# Patient Record
Sex: Female | Born: 1970
Health system: Southern US, Community
[De-identification: ages and names within clinical notes are randomized; demographics above are authoritative.]

## PROBLEM LIST (undated history)

## (undated) DIAGNOSIS — R112 Nausea with vomiting, unspecified: Secondary | ICD-10-CM

## (undated) DIAGNOSIS — I10 Essential (primary) hypertension: Secondary | ICD-10-CM

## (undated) DIAGNOSIS — E278 Other specified disorders of adrenal gland: Secondary | ICD-10-CM

## (undated) DIAGNOSIS — M1990 Unspecified osteoarthritis, unspecified site: Secondary | ICD-10-CM

## (undated) DIAGNOSIS — K8020 Calculus of gallbladder without cholecystitis without obstruction: Secondary | ICD-10-CM

## (undated) DIAGNOSIS — Z9889 Other specified postprocedural states: Secondary | ICD-10-CM

## (undated) DIAGNOSIS — K219 Gastro-esophageal reflux disease without esophagitis: Secondary | ICD-10-CM

## (undated) DIAGNOSIS — G4730 Sleep apnea, unspecified: Secondary | ICD-10-CM

## (undated) DIAGNOSIS — M069 Rheumatoid arthritis, unspecified: Secondary | ICD-10-CM

## (undated) DIAGNOSIS — E669 Obesity, unspecified: Secondary | ICD-10-CM

## (undated) DIAGNOSIS — D472 Monoclonal gammopathy: Secondary | ICD-10-CM

## (undated) HISTORY — PX: BREAST EXCISIONAL BIOPSY: SUR124

## (undated) HISTORY — DX: Unspecified osteoarthritis, unspecified site: M19.90

## (undated) HISTORY — DX: Sleep apnea, unspecified: G47.30

## (undated) HISTORY — DX: Rheumatoid arthritis, unspecified: M06.9

## (undated) HISTORY — DX: Other specified disorders of adrenal gland: E27.8

## (undated) HISTORY — DX: Monoclonal gammopathy: D47.2

## (undated) HISTORY — DX: Calculus of gallbladder without cholecystitis without obstruction: K80.20

---

## 2010-02-06 ENCOUNTER — Emergency Department (HOSPITAL_COMMUNITY): Admission: EM | Admit: 2010-02-06 | Discharge: 2010-02-06 | Payer: Self-pay | Admitting: Family Medicine

## 2010-08-01 ENCOUNTER — Emergency Department (HOSPITAL_COMMUNITY): Admission: EM | Admit: 2010-08-01 | Discharge: 2010-08-01 | Payer: Self-pay | Admitting: Emergency Medicine

## 2011-02-06 ENCOUNTER — Other Ambulatory Visit: Payer: Self-pay | Admitting: Internal Medicine

## 2011-02-16 ENCOUNTER — Other Ambulatory Visit: Payer: Self-pay | Admitting: Internal Medicine

## 2011-02-16 DIAGNOSIS — Z1231 Encounter for screening mammogram for malignant neoplasm of breast: Secondary | ICD-10-CM

## 2011-02-23 LAB — POCT I-STAT, CHEM 8
Creatinine, Ser: 0.9 mg/dL (ref 0.4–1.2)
HCT: 39 % (ref 36.0–46.0)
Hemoglobin: 13.3 g/dL (ref 12.0–15.0)
Potassium: 3.8 mEq/L (ref 3.5–5.1)
Sodium: 140 mEq/L (ref 135–145)

## 2011-02-25 ENCOUNTER — Ambulatory Visit
Admission: RE | Admit: 2011-02-25 | Discharge: 2011-02-25 | Disposition: A | Discharge status: Home / Self Care | Payer: BC Managed Care – PPO | Source: Ambulatory Visit | Attending: Internal Medicine | Admitting: Internal Medicine

## 2011-02-25 DIAGNOSIS — Z1231 Encounter for screening mammogram for malignant neoplasm of breast: Secondary | ICD-10-CM

## 2011-02-26 ENCOUNTER — Other Ambulatory Visit: Payer: Self-pay | Admitting: Internal Medicine

## 2011-02-26 DIAGNOSIS — N6452 Nipple discharge: Secondary | ICD-10-CM

## 2011-03-02 ENCOUNTER — Ambulatory Visit
Admission: RE | Admit: 2011-03-02 | Discharge: 2011-03-02 | Disposition: A | Discharge status: Home / Self Care | Payer: BC Managed Care – PPO | Source: Ambulatory Visit | Attending: Internal Medicine | Admitting: Internal Medicine

## 2011-03-02 DIAGNOSIS — N6452 Nipple discharge: Secondary | ICD-10-CM

## 2011-09-11 ENCOUNTER — Emergency Department (HOSPITAL_COMMUNITY)
Admission: EM | Admit: 2011-09-11 | Discharge: 2011-09-11 | Disposition: A | Discharge status: Home / Self Care | Payer: BC Managed Care – PPO | Attending: Emergency Medicine | Admitting: Emergency Medicine

## 2011-09-11 DIAGNOSIS — E669 Obesity, unspecified: Secondary | ICD-10-CM | POA: Insufficient documentation

## 2011-09-11 DIAGNOSIS — I10 Essential (primary) hypertension: Secondary | ICD-10-CM | POA: Insufficient documentation

## 2011-09-11 DIAGNOSIS — B9689 Other specified bacterial agents as the cause of diseases classified elsewhere: Secondary | ICD-10-CM | POA: Insufficient documentation

## 2011-09-11 DIAGNOSIS — A499 Bacterial infection, unspecified: Secondary | ICD-10-CM | POA: Insufficient documentation

## 2011-09-11 DIAGNOSIS — N760 Acute vaginitis: Secondary | ICD-10-CM | POA: Insufficient documentation

## 2011-09-11 DIAGNOSIS — N949 Unspecified condition associated with female genital organs and menstrual cycle: Secondary | ICD-10-CM | POA: Insufficient documentation

## 2011-09-11 LAB — WET PREP, GENITAL
Trich, Wet Prep: NONE SEEN
Yeast Wet Prep HPF POC: NONE SEEN

## 2011-09-11 LAB — URINALYSIS, ROUTINE W REFLEX MICROSCOPIC
Bilirubin Urine: NEGATIVE
Ketones, ur: NEGATIVE mg/dL
Nitrite: NEGATIVE
Protein, ur: NEGATIVE mg/dL
Urobilinogen, UA: 0.2 mg/dL (ref 0.0–1.0)
pH: 5 (ref 5.0–8.0)

## 2011-09-11 LAB — POCT PREGNANCY, URINE: Preg Test, Ur: NEGATIVE

## 2012-03-22 ENCOUNTER — Encounter (HOSPITAL_COMMUNITY): Payer: Self-pay | Admitting: *Deleted

## 2012-03-22 ENCOUNTER — Emergency Department (HOSPITAL_COMMUNITY)
Admission: EM | Admit: 2012-03-22 | Discharge: 2012-03-22 | Disposition: A | Discharge status: Home / Self Care | Payer: BC Managed Care – PPO | Attending: Emergency Medicine | Admitting: Emergency Medicine

## 2012-03-22 DIAGNOSIS — N63 Unspecified lump in unspecified breast: Secondary | ICD-10-CM | POA: Insufficient documentation

## 2012-03-22 DIAGNOSIS — I10 Essential (primary) hypertension: Secondary | ICD-10-CM | POA: Insufficient documentation

## 2012-03-22 DIAGNOSIS — N611 Abscess of the breast and nipple: Secondary | ICD-10-CM

## 2012-03-22 DIAGNOSIS — R509 Fever, unspecified: Secondary | ICD-10-CM | POA: Insufficient documentation

## 2012-03-22 DIAGNOSIS — N61 Mastitis without abscess: Secondary | ICD-10-CM | POA: Insufficient documentation

## 2012-03-22 HISTORY — DX: Essential (primary) hypertension: I10

## 2012-03-22 MED ORDER — HYDROCODONE-ACETAMINOPHEN 5-500 MG PO TABS: 1.0000 | ORAL_TABLET | Freq: Four times a day (QID) | ORAL | Status: AC | PRN

## 2012-03-22 MED ORDER — OXYCODONE-ACETAMINOPHEN 5-325 MG PO TABS
1.0000 | ORAL_TABLET | Freq: Once | ORAL | Status: AC
  Administered 2012-03-22: 1 via ORAL
  Filled 2012-03-22: qty 1

## 2012-03-22 MED ORDER — SULFAMETHOXAZOLE-TRIMETHOPRIM 800-160 MG PO TABS: 1.0000 | ORAL_TABLET | Freq: Two times a day (BID) | ORAL | Status: AC

## 2012-03-22 NOTE — ED Notes (Signed)
Patient reports she has noted a bump on her left breast near her nipple for 1 week.  The area has continued to get larger and more painful

## 2012-03-22 NOTE — ED Provider Notes (Signed)
History   This chart was scribed for Lyanne Co, MD by Clarita Crane. The patient was seen in room STRE1/STRE1. Patient's care was started at 1318.    CSN: [id number]  Arrival date & time 03/22/12  1318   None     Chief Complaint  Patient presents with  . Skin Problem     HPI Alexandra Powers is a 41 y.o. female who presents to the Emergency Department complaining of moderate mass to left breast above areola onset 1 week ago and gradually worsening since with associated fever and chills. Denies drainage, nausea, vomiting and previous history of similar symptoms. Patient with h/o HTN and is a current smoker.  Past Medical History  Diagnosis Date  . Hypertension     History reviewed. No pertinent past surgical history.  No family history on file.  History  Substance Use Topics  . Smoking status: Current Everyday Smoker  . Smokeless tobacco: Not on file  . Alcohol Use: Yes    OB History    Grav Para Term Preterm Abortions TAB SAB Ect Mult Living                  Review of Systems  Constitutional: Positive for fever and chills.  Gastrointestinal: Negative for nausea and vomiting.  Skin:       Abscess    Allergies  Review of patient's allergies indicates not on file.  Home Medications   Current Outpatient Rx  Name Route Sig Dispense Refill  . HYDROCODONE-ACETAMINOPHEN 5-500 MG PO TABS Oral Take 1 tablet by mouth every 6 (six) hours as needed for pain. 20 tablet 0  . SULFAMETHOXAZOLE-TRIMETHOPRIM 800-160 MG PO TABS Oral Take 1 tablet by mouth 2 (two) times daily. 20 tablet 0    BP 158/111  Temp(Src) 97.7 F (36.5 C) (Oral)  Resp 18  Ht 5\' 8"  (1.727 m)  Wt 350 lb (158.759 kg)  BMI 53.22 kg/m2  SpO2 98%  Physical Exam  Nursing note and vitals reviewed. Constitutional: She is oriented to person, place, and time. She appears well-developed and well-nourished. No distress.  HENT:  Head: Normocephalic and atraumatic.  Eyes: EOM are normal. Pupils are equal,  round, and reactive to light.  Neck: Neck supple. No tracheal deviation present.  Cardiovascular: Normal rate.   Pulmonary/Chest: Effort normal. No respiratory distress.  Abdominal: Soft. She exhibits no distension.  Musculoskeletal: Normal range of motion.       Fluctuant mass with involvement of cellulitis15cm by 12 cm in size above areola of left breast. No drainage from nipple.   Neurological: She is alert and oriented to person, place, and time. No sensory deficit.  Skin: Skin is warm and dry.  Psychiatric: She has a normal mood and affect. Her behavior is normal.    ED Course  Procedures (including critical care time)  INCISION AND DRAINAGE PROCEDURE NOTE: Patient identification was confirmed and verbal consent was obtained. This procedure was performed by Dr. Patria Mane at 1:50 PM. Site: left breast just above areola Sterile procedures observed Anesthetic used (type and amt): 2% lidocaine, 8ml Blade size: 11 blade Drainage: moderate Complexity: Complex Packing used Site anesthetized, incision made over site, wound drained and explored loculations, rinsed with copious amounts of normal saline, wound packed with sterile gauze, covered with dry, sterile dressing.  Pt tolerated procedure well without complications.  Instructions for care discussed verbally and pt provided with additional written instructions for homecare and f/u.  DIAGNOSTIC STUDIES: Oxygen Saturation is 98% on room  air, normal by my interpretation.    COORDINATION OF CARE: 1:47PM-Patient informed of current plan for treatment and evaluation and agrees with plan at this time.  1:49PM- Lyanne Co, MD to patient bedside to perform Incision and drainage.    Labs Reviewed - No data to display No results found.   1. Abscess of breast, left   2. Cellulitis of breast       MDM  Abscess with I and D. Packing and abx. 2 day f/u with pcp or ER      I personally performed the services described in  this documentation, which was scribed in my presence. The recorded information has been reviewed and considered.      Lyanne Co, MD 03/22/12 [phone]

## 2012-03-25 ENCOUNTER — Other Ambulatory Visit: Payer: Self-pay | Admitting: Internal Medicine

## 2015-05-28 ENCOUNTER — Ambulatory Visit (INDEPENDENT_AMBULATORY_CARE_PROVIDER_SITE_OTHER): Payer: BLUE CROSS/BLUE SHIELD

## 2015-05-28 ENCOUNTER — Ambulatory Visit (INDEPENDENT_AMBULATORY_CARE_PROVIDER_SITE_OTHER): Payer: BLUE CROSS/BLUE SHIELD | Admitting: Family Medicine

## 2015-05-28 VITALS — BP 162/90 | HR 96 | Temp 99.1°F | Resp 18 | Ht 69.5 in | Wt >= 6400 oz

## 2015-05-28 DIAGNOSIS — R1012 Left upper quadrant pain: Secondary | ICD-10-CM

## 2015-05-28 DIAGNOSIS — R1030 Lower abdominal pain, unspecified: Secondary | ICD-10-CM

## 2015-05-28 DIAGNOSIS — I10 Essential (primary) hypertension: Secondary | ICD-10-CM | POA: Diagnosis not present

## 2015-05-28 DIAGNOSIS — R112 Nausea with vomiting, unspecified: Secondary | ICD-10-CM | POA: Diagnosis not present

## 2015-05-28 DIAGNOSIS — R1032 Left lower quadrant pain: Secondary | ICD-10-CM

## 2015-05-28 DIAGNOSIS — R109 Unspecified abdominal pain: Secondary | ICD-10-CM

## 2015-05-28 LAB — POCT UA - MICROSCOPIC ONLY
Bacteria, U Microscopic: NEGATIVE
CASTS, UR, LPF, POC: NEGATIVE
Crystals, Ur, HPF, POC: NEGATIVE
YEAST UA: NEGATIVE

## 2015-05-28 LAB — POCT CBC
Granulocyte percent: 64.4 %G (ref 37–80)
HCT, POC: 38 % (ref 37.7–47.9)
Hemoglobin: 12.4 g/dL (ref 12.2–16.2)
LYMPH, POC: 3.1 (ref 0.6–3.4)
MCH: 30.6 pg (ref 27–31.2)
MCHC: 32.5 g/dL (ref 31.8–35.4)
MCV: 94.2 fL (ref 80–97)
MID (CBC): 0.6 (ref 0–0.9)
MPV: 8.6 fL (ref 0–99.8)
POC Granulocyte: 6.6 (ref 2–6.9)
POC LYMPH %: 30 % (ref 10–50)
POC MID %: 5.6 % (ref 0–12)
Platelet Count, POC: 269 10*3/uL (ref 142–424)
RBC: 4.03 M/uL — AB (ref 4.04–5.48)
RDW, POC: 14.9 %
WBC: 10.3 10*3/uL — AB (ref 4.6–10.2)

## 2015-05-28 LAB — COMPLETE METABOLIC PANEL WITH GFR
AST: 14 U/L (ref 0–37)
Albumin: 3.7 g/dL (ref 3.5–5.2)
Alkaline Phosphatase: 57 U/L (ref 39–117)
BILIRUBIN TOTAL: 0.5 mg/dL (ref 0.2–1.2)
BUN: 9 mg/dL (ref 6–23)
CALCIUM: 8.8 mg/dL (ref 8.4–10.5)
CO2: 27 mEq/L (ref 19–32)
CREATININE: 0.8 mg/dL (ref 0.50–1.10)
Chloride: 104 mEq/L (ref 96–112)
GLUCOSE: 91 mg/dL (ref 70–99)
Potassium: 3.7 mEq/L (ref 3.5–5.3)
Sodium: 140 mEq/L (ref 135–145)
Total Protein: 7.2 g/dL (ref 6.0–8.3)

## 2015-05-28 LAB — POCT URINALYSIS DIPSTICK
Bilirubin, UA: NEGATIVE
GLUCOSE UA: NEGATIVE
KETONES UA: NEGATIVE
Leukocytes, UA: NEGATIVE
Nitrite, UA: NEGATIVE
PH UA: 7
RBC UA: NEGATIVE
SPEC GRAV UA: 1.025
Urobilinogen, UA: 0.2

## 2015-05-28 LAB — GLUCOSE, POCT (MANUAL RESULT ENTRY): POC Glucose: 96 mg/dl (ref 70–99)

## 2015-05-28 MED ORDER — TRAMADOL HCL 50 MG PO TABS: 50.0000 mg | ORAL_TABLET | Freq: Three times a day (TID) | ORAL | Status: DC | PRN

## 2015-05-28 MED ORDER — LOSARTAN POTASSIUM-HCTZ 50-12.5 MG PO TABS: 1.0000 | ORAL_TABLET | Freq: Every day | ORAL | Status: DC

## 2015-05-28 NOTE — Progress Notes (Signed)
Subjective:  [name], female    DOB: 18-Nov-1971  Age: 44 y.o. MRN: [id number]  44 year old lady who is here for the first time. She's been having pain over the last week in her left flank and left groin area. One morning she just got up and had pain shooting down into the left groin upper part of the left leg. Knows of no specific injury. It is persisted over the past week. She has had some nausea and has vomited a couple of times. No change in bowel habits. No urinary symptoms. No fever. Not had this problem in the past. Cycle was several weeks ago. No problem with it. She is a homemaker, not employed. She does things around the house and cares for a 33-year-old grandchild. She has not had any surgeries on her abdomen. She has a history of hypertension but has not had a doctor for a while and has not been taking any medications. She was on medicine for it in the past. She has no history of kidney stones. There is no family history of kidney stones. Otherwise review of systems was unremarkable. No cardiorespiratory symptoms. Has normal bowel movements a couple of times a day. No urinary symptoms.   Objective:   Pleasant alert lady in no major distress at this moment. Abdomen obese. Her chest is clear. Heart regular without murmurs. Abdomen has normal bowel sounds. Soft without masses. No CVA tenderness could be elicited. She is tender down around the SI joint in the back. She has mild to moderate tenderness in the left groin region. She was examined lying down and standing up. No hernia could be detected. No edema.  Assessment & Plan:   Assessment: Left flank and left groin pain, etiology undetermined Hypertension, not treated currently Morbid obesity Nausea and vomiting   Plan: We'll check her CBC, urinalysis, C met, and a K[facility]  [facility] reading (PRIMARY) by  Dr. Linna Darner Nonspecific gas stool pattern. No stones noted. Poor quality film due to patient size.  Results for orders placed  or performed in visit on 05/28/15  POCT CBC  Result Value Ref Range   WBC 10.3 (A) 4.6 - 10.2 K/uL   Lymph, poc 3.1 0.6 - 3.4   POC LYMPH PERCENT 30.0 10 - 50 %L   MID (cbc) 0.6 0 - 0.9   POC MID % 5.6 0 - 12 %M   POC Granulocyte 6.6 2 - 6.9   Granulocyte percent 64.4 37 - 80 %G   RBC 4.03 (A) 4.04 - 5.48 M/uL   Hemoglobin 12.4 12.2 - 16.2 g/dL   HCT, POC 38.0 37.7 - 47.9 %   MCV 94.2 80 - 97 fL   MCH, POC 30.6 27 - 31.2 pg   MCHC 32.5 31.8 - 35.4 g/dL   RDW, POC 14.9 %   Platelet Count, POC 269 142 - 424 K/uL   MPV 8.6 0 - 99.8 fL  POCT urinalysis dipstick  Result Value Ref Range   Color, UA yellow    Clarity, UA hazy    Glucose, UA neg    Bilirubin, UA neg    Ketones, UA neg    Spec Grav, UA 1.025    Blood, UA neg    pH, UA 7.0    Protein, UA trace    Urobilinogen, UA 0.2    Nitrite, UA neg    Leukocytes, UA Negative Negative  POCT UA - Microscopic Only  Result Value Ref Range   WBC, Ur, HPF,  POC 0-1    RBC, urine, microscopic 0-1    Bacteria, U Microscopic neg    Mucus, UA large    Epithelial cells, urine per micros 1-5    Crystals, Ur, HPF, POC neg    Casts, Ur, LPF, POC neg    Yeast, UA neg   POCT glucose (manual entry)  Result Value Ref Range   POC Glucose 96 70 - 99 mg/dl    Possibly constipation is causing her symptoms since I see little else. I will have her take some laxitives.   Patient Instructions  Take MiraLAX once daily for several days into your bowels are cleaned out.   If this does not seem to make your bowels softer and looser, you can drink a bottle of sodium citrate which you can also buy over-the-counter and will make your bowels act much more vigorously  Drink plenty of water and increase the fruits and vegetables in your diet  Continue using Motrin or Advil if needed for moderate pain.  Use the tramadol pain pills one every 6 or 8 hours if needed for worse pain.  In the long run it is very important that you get more regular exercise  and work hard on eating less to try and lose some weight  In the event that the pain does not subside with cleaning out her bowels, please return here within the next week or so. If you get worse at anytime come in sooner or go to the emergency room.  The next steps would be considering doing a CT scan of the abdomen or sending you to a surgeon to make sure they do not feel a hernia (I do not feel one) or sending you to a gastroenterologist for a colon examination.  I am concerned about your blood pressure. I am going ahead and started you back on your blood pressure medication. Please return in about 3 months to follow-up on this.     Keylon Labelle, MD 05/28/2015

## 2015-05-28 NOTE — Patient Instructions (Addendum)
Take MiraLAX once daily for several days into your bowels are cleaned out.   If this does not seem to make your bowels softer and looser, you can drink a bottle of sodium citrate which you can also buy over-the-counter and will make your bowels act much more vigorously  Drink plenty of water and increase the fruits and vegetables in your diet  Continue using Motrin or Advil if needed for moderate pain.  Use the tramadol pain pills one every 6 or 8 hours if needed for worse pain.  In the long run it is very important that you get more regular exercise and work hard on eating less to try and lose some weight  In the event that the pain does not subside with cleaning out her bowels, please return here within the next week or so. If you get worse at anytime come in sooner or go to the emergency room.  The next steps would be considering doing a CT scan of the abdomen or sending you to a surgeon to make sure they do not feel a hernia (I do not feel one) or sending you to a gastroenterologist for a colon examination.  I am concerned about your blood pressure. I am going ahead and started you back on your blood pressure medication. Please return in about 3 months to follow-up on this.

## 2015-05-30 ENCOUNTER — Encounter: Payer: Self-pay | Admitting: Family Medicine

## 2015-07-25 ENCOUNTER — Emergency Department (HOSPITAL_COMMUNITY): Payer: BLUE CROSS/BLUE SHIELD

## 2015-07-25 ENCOUNTER — Emergency Department (HOSPITAL_COMMUNITY)
Admission: EM | Admit: 2015-07-25 | Discharge: 2015-07-25 | Disposition: A | Discharge status: Home / Self Care | Payer: BLUE CROSS/BLUE SHIELD | Attending: Emergency Medicine | Admitting: Emergency Medicine

## 2015-07-25 ENCOUNTER — Encounter (HOSPITAL_COMMUNITY): Payer: Self-pay | Admitting: Emergency Medicine

## 2015-07-25 DIAGNOSIS — E278 Other specified disorders of adrenal gland: Secondary | ICD-10-CM

## 2015-07-25 DIAGNOSIS — Z720 Tobacco use: Secondary | ICD-10-CM | POA: Diagnosis not present

## 2015-07-25 DIAGNOSIS — I10 Essential (primary) hypertension: Secondary | ICD-10-CM | POA: Diagnosis not present

## 2015-07-25 DIAGNOSIS — N760 Acute vaginitis: Secondary | ICD-10-CM | POA: Insufficient documentation

## 2015-07-25 DIAGNOSIS — R1032 Left lower quadrant pain: Secondary | ICD-10-CM

## 2015-07-25 DIAGNOSIS — B9689 Other specified bacterial agents as the cause of diseases classified elsewhere: Secondary | ICD-10-CM

## 2015-07-25 DIAGNOSIS — Z3202 Encounter for pregnancy test, result negative: Secondary | ICD-10-CM | POA: Insufficient documentation

## 2015-07-25 LAB — COMPREHENSIVE METABOLIC PANEL
ALT: 10 U/L — AB (ref 14–54)
AST: 19 U/L (ref 15–41)
Albumin: 3.4 g/dL — ABNORMAL LOW (ref 3.5–5.0)
Alkaline Phosphatase: 67 U/L (ref 38–126)
Anion gap: 7 (ref 5–15)
BILIRUBIN TOTAL: 0.5 mg/dL (ref 0.3–1.2)
BUN: 7 mg/dL (ref 6–20)
CHLORIDE: 105 mmol/L (ref 101–111)
CO2: 26 mmol/L (ref 22–32)
CREATININE: 0.98 mg/dL (ref 0.44–1.00)
Calcium: 8.9 mg/dL (ref 8.9–10.3)
Glucose, Bld: 115 mg/dL — ABNORMAL HIGH (ref 65–99)
Potassium: 3.9 mmol/L (ref 3.5–5.1)
Sodium: 138 mmol/L (ref 135–145)
TOTAL PROTEIN: 7.8 g/dL (ref 6.5–8.1)

## 2015-07-25 LAB — URINALYSIS, ROUTINE W REFLEX MICROSCOPIC
BILIRUBIN URINE: NEGATIVE
Glucose, UA: NEGATIVE mg/dL
HGB URINE DIPSTICK: NEGATIVE
KETONES UR: NEGATIVE mg/dL
Leukocytes, UA: NEGATIVE
Nitrite: NEGATIVE
PROTEIN: NEGATIVE mg/dL
Specific Gravity, Urine: 1.019 (ref 1.005–1.030)
UROBILINOGEN UA: 0.2 mg/dL (ref 0.0–1.0)
pH: 8.5 — ABNORMAL HIGH (ref 5.0–8.0)

## 2015-07-25 LAB — CBC WITH DIFFERENTIAL/PLATELET
Basophils Absolute: 0 10*3/uL (ref 0.0–0.1)
Basophils Relative: 0 % (ref 0–1)
EOS PCT: 0 % (ref 0–5)
Eosinophils Absolute: 0.1 10*3/uL (ref 0.0–0.7)
HEMATOCRIT: 38.2 % (ref 36.0–46.0)
Hemoglobin: 12.5 g/dL (ref 12.0–15.0)
LYMPHS ABS: 2.4 10*3/uL (ref 0.7–4.0)
LYMPHS PCT: 21 % (ref 12–46)
MCH: 31.5 pg (ref 26.0–34.0)
MCHC: 32.7 g/dL (ref 30.0–36.0)
MCV: 96.2 fL (ref 78.0–100.0)
MONO ABS: 0.4 10*3/uL (ref 0.1–1.0)
Monocytes Relative: 4 % (ref 3–12)
Neutro Abs: 8.4 10*3/uL — ABNORMAL HIGH (ref 1.7–7.7)
Neutrophils Relative %: 75 % (ref 43–77)
PLATELETS: 309 10*3/uL (ref 150–400)
RBC: 3.97 MIL/uL (ref 3.87–5.11)
RDW: 14 % (ref 11.5–15.5)
WBC: 11.3 10*3/uL — ABNORMAL HIGH (ref 4.0–10.5)

## 2015-07-25 LAB — WET PREP, GENITAL
Trich, Wet Prep: NONE SEEN
Yeast Wet Prep HPF POC: NONE SEEN

## 2015-07-25 LAB — POC URINE PREG, ED: PREG TEST UR: NEGATIVE

## 2015-07-25 MED ORDER — IOHEXOL 300 MG/ML  SOLN
25.0000 mL | Freq: Once | INTRAMUSCULAR | Status: AC | PRN
  Administered 2015-07-25: 25 mL via ORAL

## 2015-07-25 MED ORDER — HYDROCHLOROTHIAZIDE 25 MG PO TABS
25.0000 mg | ORAL_TABLET | Freq: Once | ORAL | Status: AC
  Administered 2015-07-25: 25 mg via ORAL
  Filled 2015-07-25: qty 1

## 2015-07-25 MED ORDER — ONDANSETRON HCL 4 MG/2ML IJ SOLN
4.0000 mg | Freq: Once | INTRAMUSCULAR | Status: AC
  Administered 2015-07-25: 4 mg via INTRAVENOUS
  Filled 2015-07-25: qty 2

## 2015-07-25 MED ORDER — HYDROCHLOROTHIAZIDE 25 MG PO TABS: 25.0000 mg | ORAL_TABLET | Freq: Every day | ORAL | Status: DC

## 2015-07-25 MED ORDER — IOHEXOL 300 MG/ML  SOLN
100.0000 mL | Freq: Once | INTRAMUSCULAR | Status: AC | PRN
  Administered 2015-07-25: 100 mL via INTRAVENOUS

## 2015-07-25 MED ORDER — NAPROXEN 500 MG PO TABS: 500.0000 mg | ORAL_TABLET | Freq: Two times a day (BID) | ORAL | Status: DC

## 2015-07-25 MED ORDER — METRONIDAZOLE 500 MG PO TABS: 500.0000 mg | ORAL_TABLET | Freq: Two times a day (BID) | ORAL | Status: AC

## 2015-07-25 MED ORDER — MORPHINE SULFATE (PF) 4 MG/ML IV SOLN
4.0000 mg | Freq: Once | INTRAVENOUS | Status: AC
  Administered 2015-07-25: 4 mg via INTRAVENOUS
  Filled 2015-07-25: qty 1

## 2015-07-25 NOTE — ED Notes (Signed)
Pt in from home reporting LLQ abd pain, has hx of ovarian cyst. States pain does feel different

## 2015-07-25 NOTE — Discharge Instructions (Signed)
Abdominal Pain, Women °Abdominal (stomach, pelvic, or belly) pain can be caused by many things. It is important to tell your doctor: °· The location of the pain. °· Does it come and go or is it present all the time? °· Are there things that start the pain (eating certain foods, exercise)? °· Are there other symptoms associated with the pain (fever, nausea, vomiting, diarrhea)? °All of this is helpful to know when trying to find the cause of the pain. °CAUSES  °· Stomach: virus or bacteria infection, or ulcer. °· Intestine: appendicitis (inflamed appendix), regional ileitis (Crohn's disease), ulcerative colitis (inflamed colon), irritable bowel syndrome, diverticulitis (inflamed diverticulum of the colon), or cancer of the stomach or intestine. °· Gallbladder disease or stones in the gallbladder. °· Kidney disease, kidney stones, or infection. °· Pancreas infection or cancer. °· Fibromyalgia (pain disorder). °· Diseases of the female organs: °¨ Uterus: fibroid (non-cancerous) tumors or infection. °¨ Fallopian tubes: infection or tubal pregnancy. °¨ Ovary: cysts or tumors. °¨ Pelvic adhesions (scar tissue). °¨ Endometriosis (uterus lining tissue growing in the pelvis and on the pelvic organs). °¨ Pelvic congestion syndrome (female organs filling up with blood just before the menstrual period). °¨ Pain with the menstrual period. °¨ Pain with ovulation (producing an egg). °¨ Pain with an IUD (intrauterine device, birth control) in the uterus. °¨ Cancer of the female organs. °· Functional pain (pain not caused by a disease, may improve without treatment). °· Psychological pain. °· Depression. °DIAGNOSIS  °Your doctor will decide the seriousness of your pain by doing an examination. °· Blood tests. °· X-rays. °· Ultrasound. °· CT scan (computed tomography, special type of X-ray). °· MRI (magnetic resonance imaging). °· Cultures, for infection. °· Barium enema (dye inserted in the large intestine, to better view it with  X-rays). °· Colonoscopy (looking in intestine with a lighted tube). °· Laparoscopy (minor surgery, looking in abdomen with a lighted tube). °· Major abdominal exploratory surgery (looking in abdomen with a large incision). °TREATMENT  °The treatment will depend on the cause of the pain.  °· Many cases can be observed and treated at home. °· Over-the-counter medicines recommended by your caregiver. °· Prescription medicine. °· Antibiotics, for infection. °· Birth control pills, for painful periods or for ovulation pain. °· Hormone treatment, for endometriosis. °· Nerve blocking injections. °· Physical therapy. °· Antidepressants. °· Counseling with a psychologist or psychiatrist. °· Minor or major surgery. °HOME CARE INSTRUCTIONS  °· Do not take laxatives, unless directed by your caregiver. °· Take over-the-counter pain medicine only if ordered by your caregiver. Do not take aspirin because it can cause an upset stomach or bleeding. °· Try a clear liquid diet (broth or water) as ordered by your caregiver. Slowly move to a bland diet, as tolerated, if the pain is related to the stomach or intestine. °· Have a thermometer and take your temperature several times a day, and record it. °· Bed rest and sleep, if it helps the pain. °· Avoid sexual intercourse, if it causes pain. °· Avoid stressful situations. °· Keep your follow-up appointments and tests, as your caregiver orders. °· If the pain does not go away with medicine or surgery, you may try: °¨ Acupuncture. °¨ Relaxation exercises (yoga, meditation). °¨ Group therapy. °¨ Counseling. °SEEK MEDICAL CARE IF:  °· You notice certain foods cause stomach pain. °· Your home care treatment is not helping your pain. °· You need stronger pain medicine. °· You want your IUD removed. °· You feel faint or   lightheaded.  You develop nausea and vomiting.  You develop a rash.  You are having side effects or an allergy to your medicine. SEEK IMMEDIATE MEDICAL CARE IF:   Your  pain does not go away or gets worse.  You have a fever.  Your pain is felt only in portions of the abdomen. The right side could possibly be appendicitis. The left lower portion of the abdomen could be colitis or diverticulitis.  You are passing blood in your stools (bright red or black tarry stools, with or without vomiting).  You have blood in your urine.  You develop chills, with or without a fever.  You pass out. MAKE SURE YOU:   Understand these instructions.  Will watch your condition.  Will get help right away if you are not doing well or get worse. Document Released: 09/13/2007 Document Revised: 04/02/2014 Document Reviewed: 10/03/2009 [facility] Patient Information 2015 [place], [place]. This information is not intended to replace advice given to you by your health care provider. Make sure you discuss any questions you have with your health care provider.   Emergency Department Resource Guide 1) Find a Doctor and Pay Out of Pocket Although you won't have to find out who is covered by your insurance plan, it is a good idea to ask around and get recommendations. You will then need to call the office and see if the doctor you have chosen will accept you as a new patient and what types of options they offer for patients who are self-pay. Some doctors offer discounts or will set up payment plans for their patients who do not have insurance, but you will need to ask so you aren't surprised when you get to your appointment.  2) Contact Your Local Health Department Not all health departments have doctors that can see patients for sick visits, but many do, so it is worth a call to see if yours does. If you don't know where your local health department is, you can check in your phone book. The CDC also has a tool to help you locate your state's health department, and many state websites also have listings of all of their local health departments.  3) Find a [facility] If your illness  is not likely to be very severe or complicated, you may want to try a walk in clinic. These are popping up all over the country in pharmacies, drugstores, and shopping centers. They're usually staffed by nurse practitioners or physician assistants that have been trained to treat common illnesses and complaints. They're usually fairly quick and inexpensive. However, if you have serious medical issues or chronic medical problems, these are probably not your best option.  No Primary Care Doctor: - Call Health Connect at  [phone] - they can help you locate a primary care doctor that  accepts your insurance, provides certain services, etc. - Physician Referral Service- [phone]  Chronic Pain Problems: Organization         Address  Phone   Notes  [facility]  [phone] Patients need to be referred by their primary care doctor.   Medication Assistance: Organization         Address  Phone   Notes  [facility] Medication [facility] [address] [place], [place] [address] [phone] --Must be a resident of [facility] -- Must have NO insurance coverage whatsoever (no Medicaid/ Medicare, etc.) -- The pt. MUST have a primary care doctor that directs their care  regularly and follows them in the community   MedAssist  [phone]   [employer]  [phone]    Agencies that provide inexpensive medical care: Organization         Address  Phone   Notes  [place]  [phone]   [facility]    [phone]   [facility], [place] [address] [phone]   [place] [address] [place] [phone]   Planned Parenthood    [phone]   [facility]    [phone]   [place] and [address], [place] Phone:  [phone], Fax:  [phone] Hours of Operation:  9 am - 6  pm, M-F.  Also accepts Medicaid/Medicare and self-pay.  [facility] for [place], [address], [place] Phone: [phone], Fax: [phone]. Hours of Operation:  8:30 am - 5:30 pm, M-F.  Also accepts Medicaid and self-pay.  [facility] [place] [address] [place] Phone: [phone]   [place], [place], [place] [phone], Ext. [phone] Mondays & Thursdays: 7-9 AM.  First 15 patients are seen on a first come, first serve basis.    [place] Providers:  Organization         Address  Phone   Notes  [facility] [address],  [phone] Also accepts self-pay patients.  [facility] [address], [place]  [phone]   [place], [address], [place] [phone]   [facility] [address] [place] [phone]   Lucianne Lei [address], [place]   [phone] Only accepts [place] Access [place] patients after they have their name applied to their card.   Self-Pay (no insurance) in [facility]:  Organization         Address  Phone   Notes  Sickle Cell Patients, [facility] [place] [phone]   [facility] [place] [phone]   [facility] [place]  [place], [place], [place] [phone]   [facility]/Dr. Osei-Bonsu  [address], [place] or [address], [place] [phone] Phone number for both [place] and [place] locations is the same.  Urgent Medical and [facility] [address] [place] [phone]   [facility] [address], [place] or [address] [phone]   [facility] [address] [place] [phone], phone; [phone], fax Sees patients 1st and 3rd Saturday of every month.  Must not qualify for public or private insurance (i.e. Medicaid, Medicare, [place] Health Choice, Veterans' Benefits)  Household income should be no more than 200% of the poverty level The clinic cannot treat you if you are pregnant or think you are pregnant  Sexually transmitted diseases are not treated at the clinic.    Dental Care: Organization         Address  Phone  Notes  [facility] of [facility] [address] [place], [place] [phone] Accepts children up to age 31  who are enrolled in Medicaid or [place] Health Choice; pregnant women with a Medicaid card; and children who have applied for Medicaid or [place] Health Choice, but were declined, whose parents can pay a reduced fee at time of service.  [facility] Department of [facility]  [address], [place] [phone] Accepts children up to age 48 who are enrolled in [place] or [place]; pregnant women with a Medicaid card; and children who have applied for Medicaid or [place] Health Choice, but were declined, whose parents can pay a reduced fee at time of service.  [facility] Access PRO[facility]  [place] [phone] Patients are seen by appointment only. Walk-ins are not accepted. [place] will see patients 7 years of age and older. Monday - Tuesday (8am-5pm) Most Wednesdays (8:30-5pm) $30 per visit, cash only  [facility] Adult Dental Access PROGRAM  [address], [facility] [phone] Patients are seen by appointment only. Walk-ins are not accepted. [place] will see patients 36 years of age and older. One Wednesday Evening (Monthly: Volunteer Based).  $30 per visit, cash only  [facility]  [phone] for adults; Children under age 27, call Graduate Pediatric Dentistry at [phone]. Children aged 25-14, please call [phone] to request a pediatric application.  Dental services are provided in all areas of dental care including fillings, crowns and bridges, complete and partial dentures, implants, gum treatment, root canals, and extractions. Preventive care is also provided. Treatment is provided to both adults and children. Patients are selected via a lottery and there is often a waiting list.   [facility] [address], [place]  [phone] [email or web address]   [facility] [address] [place], [place] [phone], Ext. [phone] Second and Fourth Thursday of each month, opens at 6:30 AM; Clinic ends at 9 AM.  Patients are seen on a first-come first-served basis, and a limited number are seen during each clinic.   [facility]  [address] Hillard Danker [place], [place] [phone]   Eligibility Requirements You must have lived in [place], [place], or [place] counties for at least the last three months.   You cannot be eligible for state or federal sponsored [employer], including [employer], [place], or [employer].   You generally cannot be eligible for healthcare insurance through your employer.    How to apply: Eligibility screenings are held every Tuesday and Wednesday afternoon from 1:00 pm until 4:00 pm. You do not need an appointment for the interview!  [facility] [address] [place], [place]   [place]  [place] Department  [place]  [phone]    Behavioral Health Resources in the Community: Intensive Outpatient Programs Organization         Address  Phone  Notes  Itmann Linden. [address], [place], [place] [phone]   [facility] [address], [place], [place]   ADS: Alcohol & Drug Svcs  [address] [place], [place]   [place] [address]  [place], [place] or [phone]   Substance Abuse Resources Organization         Address  Phone  Notes  Alcohol and Drug Services  [phone]   [facility]  [place]   Chinita Pester  [phone]   Residential & Outpatient Substance Abuse Program  [phone]   Psychological Services Organization         Address  Phone  Notes  [place]  [place]  [phone]   [place] [address] [place] or [phone]    Mobile Crisis Teams Organization         Address  Phone  Notes  [facility], Mobile Crisis Care Unit  [phone]   [facility]  [address] [place], [place]   [place] Levels [address], [place] city  [phone]    Self-Help/Support Groups Organization         Address  Phone             Notes  [place]. of [place] - variety of support groups  [place] Call for more information  Narcotics Anonymous (NA), [facility] [address], [employer] [place]  2 meetings at this location   Special educational needs teacher         Address  Phone  Notes  [facility] Residential Treatment [place],    [place]  [phone]   [facility]  [address], [place] [address], [place], [place]   [place], [place] [phone] Admissions: 8am-3pm M-F  Incentives Substance [place] [address],    [place], [place] [phone]   [facility] [address] [place], [place], [place]   [facility] [address]  [place], [place]   [facility] - Intensive Outpatient [address] Kristeen [address], [place], [place]   [facility] ([place] [place].,  [place], [place] [phone] or [phone]   [facility] (RTS) [address], [place], [place] Accepts Medicaid  [facility] [place] [address],  [place] [phone] Substance Abuse/Addiction Treatment   [facility] Organization         Address  Phone  Notes  [facility]  [phone]   Domenic Schwab, PhD [address] Arlis Porta [place], [place]   [phone] or [phone]   [facility] [place], [place] [phone]   [facility] [address] [place], [place] [phone] Insurance/Medicaid/sponsorship through [facility] and Families [address], [address]                                    [place], [place] [phone] [place] [address], [place] [phone]    Dr. Adele Schilder  [phone]   [facility] Dept. 1) [address] [place] 2) [name] 3)  [place] 65, [place] [phone]  [phone]   [place] [phone] or [phone] (After Hours)

## 2015-07-25 NOTE — ED Provider Notes (Signed)
CSN: [id number]     Arrival date & time 07/25/15  0615 History   First MD Initiated Contact with Patient 07/25/15 0654     Chief Complaint  Patient presents with  . Abdominal Pain     (Consider location/radiation/quality/duration/timing/severity/associated sxs/prior Treatment) Patient is a 44 y.o. female presenting with abdominal pain.  Abdominal Pain Pain location:  LLQ Pain quality: pressure   Pain quality comment:  "just a pain" like something iniside is going to bust Pain radiates to:  Does not radiate Duration: 2-3 yrs on and off, 3 months worsening episodes, more intense lasting longer, this episode started yesterday. Timing:  Intermittent Progression:  Waxing and waning Chronicity:  Recurrent Relieved by:  Nothing Worsened by:  Nothing tried Ineffective treatments:  NSAIDs (percocet\) Associated symptoms: nausea and vomiting (twice olast night)   Associated symptoms: no chest pain, no cough, no diarrhea, no dysuria, no fever (felt hot), no shortness of breath, no sore throat, no vaginal bleeding (irregular) and no vaginal discharge     Past Medical History  Diagnosis Date  . Hypertension    History reviewed. No pertinent past surgical history. Family History  Problem Relation Age of Onset  . Arthritis Mother    Social History  Substance Use Topics  . Smoking status: Current Every Day Smoker -- 0.50 packs/day for 10 years    Types: Cigarettes  . Smokeless tobacco: None  . Alcohol Use: 0.0 oz/week    0 Standard drinks or equivalent per week   OB History    No data available     Review of Systems  Constitutional: Negative for fever (felt hot).  HENT: Negative for sore throat.   Eyes: Negative for visual disturbance.  Respiratory: Negative for cough and shortness of breath.   Cardiovascular: Negative for chest pain.  Gastrointestinal: Positive for nausea, vomiting (twice olast night) and abdominal pain. Negative for diarrhea.  Genitourinary: Negative for  dysuria, vaginal bleeding (irregular), vaginal discharge and difficulty urinating.  Musculoskeletal: Negative for back pain and neck pain.  Skin: Negative for rash.  Neurological: Negative for syncope and headaches.      Allergies  Review of patient's allergies indicates no known allergies.  Home Medications   Prior to Admission medications   Medication Sig Start Date End Date Taking? Authorizing Provider  ibuprofen (ADVIL,MOTRIN) 800 MG tablet Take 1,600 mg by mouth daily as needed. For pain.   Yes Historical Provider, MD  losartan-hydrochlorothiazide (HYZAAR) 50-12.5 MG per tablet Take 1 tablet by mouth daily. 05/28/15  Yes Posey Boyer, MD  traMADol (ULTRAM) 50 MG tablet Take 1 tablet (50 mg total) by mouth every 8 (eight) hours as needed. 05/28/15  Yes Posey Boyer, MD   BP 194/120 mmHg  Pulse 89  Temp(Src) 97.6 F (36.4 C) (Oral)  Resp 22  SpO2 100%  LMP 07/02/2015 Physical Exam  Constitutional: She is oriented to person, place, and time. She appears well-developed and well-nourished. No distress.  HENT:  Head: Normocephalic and atraumatic.  Eyes: Conjunctivae and EOM are normal.  Neck: Normal range of motion.  Cardiovascular: Normal rate, regular rhythm, normal heart sounds and intact distal pulses.  Exam reveals no gallop and no friction rub.   No murmur heard. Pulmonary/Chest: Effort normal and breath sounds normal. No respiratory distress. She has no wheezes. She has no rales.  Abdominal: Soft. She exhibits no distension. There is tenderness in the left lower quadrant. There is guarding.  Musculoskeletal: She exhibits no edema or tenderness.  Neurological: She  is alert and oriented to person, place, and time.  Skin: Skin is warm and dry. No rash noted. She is not diaphoretic. No erythema.  Nursing note and vitals reviewed.   ED Course  Procedures (including critical care time) Labs Review Labs Reviewed  CBC WITH DIFFERENTIAL/PLATELET  COMPREHENSIVE METABOLIC  PANEL  URINALYSIS, ROUTINE W REFLEX MICROSCOPIC (NOT AT [facility])  POC URINE PREG, ED    Imaging Review No results found. I have personally reviewed and evaluated these images and lab results as part of my medical decision-making.   EKG Interpretation None      MDM   Final diagnoses:  None   44 year old female with a history of hypertension and ovarian cysts presents with concern of intermittent left lower quadrant pain with this episode beginning yesterday.  DDx includes appendicitis, pyelonephritis, nephrolithiasis, diverticulitis, PID, ovarian torsion, ectopic pregnancy, and tuboovarian abscess.  Upreg negative.   Urinalysis showsno sign of infection. Given reports of loose stool in addition to significant LLQ tenderness, ordered CT abdomen to evaluate for diverticulitis.  Patient with history of hypertension diagnosed at an urgent care and does not have primary care physician however does report taking daily blood pressure medications however missed her dose this morning. On presentation to the emergency department her blood pressure is 203/122 and on repeat testing is down to 190s over 120. The patient denies any chest pain, shortness of breath, neurologic symptoms, or headache and have low suspicion for hypertensive emergency by history and physical. Patient was given 25 of hydrochlorothiazide with improvement in blood pressures.  CT abdomen and pelvis returned showing signs of cholelithiasis without cholecystitis, a 2 cm left adrenal mass, and 2.4 cm left anterior abdominal wall mass. The abdominal wall mass is most likely a cyst base of exam with no signs of abscess. Discussed the importance of further workup for left adrenal mass and especially in setting of patient's hypertension. A request was placed with case management. A pelvic exam was performed which showed signs of bacterial vaginosis and pt was given rx for flagyl.  Have low suspicion for ovarian torsion given pt well appearing,  not in significant distress.  Pt declined empiric STD treatment.  Given hydrochlorothizide rx, rx for naproxen, and recommend close outpt follow up for htn, and work up for adrenal mass.    Gareth Morgan, MD 07/25/15 2255

## 2015-07-25 NOTE — ED Notes (Signed)
Pt remains monitored by blood pressure and pulse ox.  

## 2015-07-25 NOTE — ED Notes (Signed)
Pt placed on monitor. Pt remains monitored by blood pressure and pulse ox.

## 2015-07-25 NOTE — ED Notes (Signed)
Pt finished contrast. Called CT to notify pt was ready.

## 2015-07-25 NOTE — ED Notes (Signed)
Patient transported to CT 

## 2015-07-26 LAB — GC/CHLAMYDIA PROBE AMP (~~LOC~~) NOT AT ARMC
CHLAMYDIA, DNA PROBE: NEGATIVE
NEISSERIA GONORRHEA: NEGATIVE

## 2015-08-01 ENCOUNTER — Ambulatory Visit: Payer: BLUE CROSS/BLUE SHIELD | Admitting: Family Medicine

## 2015-08-14 ENCOUNTER — Telehealth: Payer: Self-pay | Admitting: General Practice

## 2015-08-14 ENCOUNTER — Ambulatory Visit: Payer: BLUE CROSS/BLUE SHIELD | Admitting: Family Medicine

## 2015-08-14 NOTE — Telephone Encounter (Signed)
Called patient to reschedule. Number not in service.

## 2015-08-29 ENCOUNTER — Ambulatory Visit (INDEPENDENT_AMBULATORY_CARE_PROVIDER_SITE_OTHER): Payer: BLUE CROSS/BLUE SHIELD | Admitting: Family Medicine

## 2015-08-29 ENCOUNTER — Encounter: Payer: Self-pay | Admitting: Family Medicine

## 2015-08-29 VITALS — BP 184/103 | HR 74 | Temp 98.1°F | Resp 16 | Ht 69.5 in

## 2015-08-29 DIAGNOSIS — Z23 Encounter for immunization: Secondary | ICD-10-CM | POA: Diagnosis not present

## 2015-08-29 DIAGNOSIS — I10 Essential (primary) hypertension: Secondary | ICD-10-CM | POA: Diagnosis not present

## 2015-08-29 DIAGNOSIS — Z7189 Other specified counseling: Secondary | ICD-10-CM | POA: Diagnosis not present

## 2015-08-29 DIAGNOSIS — E278 Other specified disorders of adrenal gland: Secondary | ICD-10-CM

## 2015-08-29 DIAGNOSIS — R1012 Left upper quadrant pain: Secondary | ICD-10-CM | POA: Diagnosis not present

## 2015-08-29 DIAGNOSIS — N83202 Unspecified ovarian cyst, left side: Secondary | ICD-10-CM

## 2015-08-29 DIAGNOSIS — Z7689 Persons encountering health services in other specified circumstances: Secondary | ICD-10-CM

## 2015-08-29 DIAGNOSIS — E279 Disorder of adrenal gland, unspecified: Secondary | ICD-10-CM

## 2015-08-29 DIAGNOSIS — Z0000 Encounter for general adult medical examination without abnormal findings: Secondary | ICD-10-CM

## 2015-08-29 DIAGNOSIS — R109 Unspecified abdominal pain: Secondary | ICD-10-CM

## 2015-08-29 DIAGNOSIS — N8320 Unspecified ovarian cysts: Secondary | ICD-10-CM

## 2015-08-29 LAB — CBC WITH DIFFERENTIAL/PLATELET
BASOS ABS: 0 10*3/uL (ref 0.0–0.1)
Basophils Relative: 0 % (ref 0–1)
Eosinophils Absolute: 0.2 10*3/uL (ref 0.0–0.7)
Eosinophils Relative: 2 % (ref 0–5)
HEMATOCRIT: 36.7 % (ref 36.0–46.0)
HEMOGLOBIN: 12.1 g/dL (ref 12.0–15.0)
LYMPHS ABS: 3.4 10*3/uL (ref 0.7–4.0)
LYMPHS PCT: 32 % (ref 12–46)
MCH: 31.7 pg (ref 26.0–34.0)
MCHC: 33 g/dL (ref 30.0–36.0)
MCV: 96.1 fL (ref 78.0–100.0)
MPV: 10 fL (ref 8.6–12.4)
Monocytes Absolute: 0.8 10*3/uL (ref 0.1–1.0)
Monocytes Relative: 8 % (ref 3–12)
NEUTROS ABS: 6.1 10*3/uL (ref 1.7–7.7)
NEUTROS PCT: 58 % (ref 43–77)
Platelets: 307 10*3/uL (ref 150–400)
RBC: 3.82 MIL/uL — AB (ref 3.87–5.11)
RDW: 14.1 % (ref 11.5–15.5)
WBC: 10.6 10*3/uL — ABNORMAL HIGH (ref 4.0–10.5)

## 2015-08-29 MED ORDER — LISINOPRIL-HYDROCHLOROTHIAZIDE 20-25 MG PO TABS: 1.0000 | ORAL_TABLET | Freq: Every day | ORAL | Status: DC

## 2015-08-29 MED ORDER — TRAMADOL HCL 50 MG PO TABS: 50.0000 mg | ORAL_TABLET | Freq: Three times a day (TID) | ORAL | Status: DC | PRN

## 2015-08-29 NOTE — Progress Notes (Signed)
[name], female   DOB: May 24, 1971, 44 y.o.   MRN: [id number]   Alexandra Powers, is a 44 y.o. female  [id number]  [id number]  DOB - 01-23-1971  CC:  Chief Complaint  Patient presents with  . Establish Care    er follow up. Needs bp med        HPI: Alexandra Powers is a 44 y.o. female here to establish care. She was seen in ED recently for left lower quadrant pain. Notes from ED are posted below. She has a history of hypertension and ovarian cyst. She was started on  Hydrodiuril 25 for her hypertension. Imaging studies shows an adrenal mass and a mass in the left abdominal wall. She was instructed to come here for follow-up and to estabish care.       Hospital Note. 44 year old female with a history of hypertension and ovarian cysts presents with concern of intermittent left lower quadrant pain with this episode beginning yesterday. DDx includes appendicitis, pyelonephritis, nephrolithiasis, diverticulitis, PID, ovarian torsion, ectopic pregnancy, and tuboovarian abscess. Upreg negative. Urinalysis showsno sign of infection. Given reports of loose stool in addition to significant LLQ tenderness, ordered CT abdomen to evaluate for diverticulitis. Patient with history of hypertension diagnosed at an urgent care and does not have primary care physician however does report taking daily blood pressure medications however missed her dose this morning. On presentation to the emergency department her blood pressure is 203/122 and on repeat testing is down to 190s over 120. The patient denies any chest pain, shortness of breath, neurologic symptoms, or headache and have low suspicion for hypertensive emergency by history and physical. Patient was given 25 of hydrochlorothiazide with improvement in blood pressures.  CT abdomen and pelvis returned showing signs of cholelithiasis without cholecystitis, a 2 cm left adrenal mass, and 2.4 cm left anterior abdominal wall mass. The abdominal wall  mass is most likely a cyst base of exam with no signs of abscess. Discussed the importance of further workup for left adrenal mass and especially in setting of patient's hypertension. A request was placed with case management. A pelvic exam was performed which showed signs of bacterial vaginosis and pt was given rx for flagyl. Have low suspicion for ovarian torsion given pt well appearing, not in significant distress. Pt declined empiric STD treatment. Given hydrochlorothizide rx, rx for naproxen, and recommend close outpt follow up for htn, and work up for adrenal mass.    Alexandra Morgan, MD 07/25/15 2255        Electronically signed by Alexandra Morgan, MD at 07/25/2015 10:55 PM                       No Known Allergies Past Medical History  Diagnosis Date  . Hypertension    Current Outpatient Prescriptions on File Prior to Visit  Medication Sig Dispense Refill  . naproxen (NAPROSYN) 500 MG tablet Take 1 tablet (500 mg total) by mouth 2 (two) times daily. 30 tablet 0  . ibuprofen (ADVIL,MOTRIN) 800 MG tablet Take 1,600 mg by mouth daily as needed. For pain.    . [DISCONTINUED] losartan-hydrochlorothiazide (HYZAAR) 50-12.5 MG per tablet Take 1 tablet by mouth daily. 30 tablet 5   No current facility-administered medications on file prior to visit.   Family History  Problem Relation Age of Onset  . Arthritis Mother    Social History   Social History  . Marital Status: Married    Spouse Name: N/A  . Number  of Children: N/A  . Years of Education: N/A   Occupational History  . Not on file.   Social History Main Topics  . Smoking status: Current Every Day Smoker -- 0.50 packs/day for 10 years    Types: Cigarettes  . Smokeless tobacco: Not on file  . Alcohol Use: 0.0 oz/week    0 Standard drinks or equivalent per week  . Drug Use: Yes    Special: Marijuana  . Sexual Activity: Not on file   Other Topics Concern  . Not on file   Social History  Narrative    Review of Systems: Constitutional: Negative for fever, chills, appetite change, weight loss,  Fatigue. Skin: Negative for rashes or lesions of concern. HENT: Negative for ear pain, ear discharge.nose bleeds Eyes: Negative for pain, discharge, redness, itching and visual disturbance. Neck: Negative for pain, stiffness Respiratory: Negative forshortness of breath. Positive for recent coughing Cardiovascular: Negative for chest pain, palpitations and leg swelling. Gastrointestinal: Positive for abdominal pain, nausea. Negative NNNN vomiting, diarrhea, constipations Genitourinary: Negative for dysuria, urgency, frequency, hematuria,  Musculoskeletal: Positive for back pain,. Negative for other joint pain, joint  swelling, and gait problem.Negative for weakness. Neurological: Negative for dizziness, tremors, seizures, syncope,   light-headedness, numbness. Positive for headaches Hematological: Negative for easy or bleeding. Positive for perceived easy bruising. Psychiatric/Behavioral: Negative for depression, anxiety, decreased concentration, confusion   Objective:   Filed Vitals:   08/29/15 1349  BP: 184/103  Pulse: 74  Temp: 98.1 F (36.7 C)  Resp: 16    Physical Exam: Constitutional: Patient appears well-developed and well-nourished. No distress. HENT: Normocephalic, atraumatic, External right and left ear normal. Oropharynx is clear and moist.  Eyes: Conjunctivae and EOM are normal. PERRLA, no scleral icterus. Neck: Normal ROM. Neck supple. No lymphadenopathy, No thyromegaly. CVS: RRR, S1/S2 +, no murmurs, no gallops, no rubs Pulmonary: Effort and breath sounds normal, no stridor, rhonchi, wheezes, rales.  Abdominal: Soft. Normoactive BS,, no distension, rebound or guarding. Generalized lower abdominal pain present Musculoskeletal: Normal range of motion. No edema and no tenderness.  Neuro: Alert.Normal muscle tone coordination. Non-focal Skin: Skin is warm and  dry. No rash noted. Not diaphoretic. No erythema. No pallor. Psychiatric: Normal mood and affect. Behavior, judgment, thought content normal.  Lab Results  Component Value Date   WBC 11.3* 07/25/2015   HGB 12.5 07/25/2015   HCT 38.2 07/25/2015   MCV 96.2 07/25/2015   PLT 309 07/25/2015   Lab Results  Component Value Date   CREATININE 0.98 07/25/2015   BUN 7 07/25/2015   NA 138 07/25/2015   K 3.9 07/25/2015   CL 105 07/25/2015   CO2 26 07/25/2015    No results found for: HGBA1C Lipid Panel  No results found for: CHOL, TRIG, HDL, CHOLHDL, VLDL, LDLCALC     Assessment and plan:   1. Acute left flank pain  - traMADol (ULTRAM) 50 MG tablet; Take 1 tablet (50 mg total) by mouth every 8 (eight) hours as needed.  Dispense: 30 tablet; Refill: 0  2. Encounter to establish care - Have reveiwed information provided by the patient/ -Have reviewed pertinent hospital records'   3. Essential hypertension  - COMPLETE METABOLIC PANEL WITH GFR - TSH  4. Health care maintenance  - COMPLETE METABOLIC PANEL WITH GFR - CBC with Differential - TSH - Lipid panel - Vitamin D 1,25 dihydroxy - Flu Vaccine QUAD 36+ mos PF IM (Fluarix & Fluzone Quad PF)  5. Hypertension -Discontinue hydrodiuril 25 -Prescribe lisinopril/hctz 20/25,  one po q day, #90 with three refills.   Return in about 4 weeks (around 09/26/2015) for HTN.  The patient was given clear instructions to go to ER or return to medical center if symptoms don't improve, worsen or new problems develop. The patient verbalized understanding.    Micheline Chapman FNP  08/30/2015, 12:26 PM

## 2015-08-29 NOTE — Patient Instructions (Addendum)
1. Stop hydrodiuril 2. Start lisinopril/hctz 20/25 for BP 3. Is important that you try to stop smoking.  4. Weight loss is also am important part of keeping BP in control 5. Also the DASH diet is a good diet for people with hypertension 6. We will work on a referral to specialist for evaluation of adrenal mass and uterine cyst. 7. If you have any questions we have not covered, please call. We will let you know if there is anything in your bloodwork that needs attention. 8. Come back in one month for BP check.   DASH Eating Plan DASH stands for "Dietary Approaches to Stop Hypertension." The DASH eating plan is a healthy eating plan that has been shown to reduce high blood pressure (hypertension). Additional health benefits may include reducing the risk of type 2 diabetes mellitus, heart disease, and stroke. The DASH eating plan may also help with weight loss. WHAT DO I NEED TO KNOW ABOUT THE DASH EATING PLAN? For the DASH eating plan, you will follow these general guidelines:  Choose foods with a percent daily value for sodium of less than 5% (as listed on the food label).  Use salt-free seasonings or herbs instead of table salt or sea salt.  Check with your health care provider or pharmacist before using salt substitutes.  Eat lower-sodium products, often labeled as "lower sodium" or "no salt added."  Eat fresh foods.  Eat more vegetables, fruits, and low-fat dairy products.  Choose whole grains. Look for the word "whole" as the first word in the ingredient list.  Choose fish and skinless chicken or [place] more often than red meat. Limit fish, poultry, and meat to 6 oz (170 g) each day.  Limit sweets, desserts, sugars, and sugary drinks.  Choose heart-healthy fats.  Limit cheese to 1 oz (28 g) per day.  Eat more home-cooked food and less restaurant, buffet, and fast food.  Limit fried foods.  Cook foods using methods other than frying.  Limit canned vegetables. If you do  use them, rinse them well to decrease the sodium.  When eating at a restaurant, ask that your food be prepared with less salt, or no salt if possible. WHAT FOODS CAN I EAT? Seek help from a dietitian for individual calorie needs. Grains Whole grain or whole wheat bread. Brown rice. Whole grain or whole wheat pasta. Quinoa, bulgur, and whole grain cereals. Low-sodium cereals. Corn or whole wheat flour tortillas. Whole grain cornbread. Whole grain crackers. Low-sodium crackers. Vegetables Fresh or frozen vegetables (raw, steamed, roasted, or grilled). Low-sodium or reduced-sodium tomato and vegetable juices. Low-sodium or reduced-sodium tomato sauce and paste. Low-sodium or reduced-sodium canned vegetables.  Fruits All fresh, canned (in natural juice), or frozen fruits. Meat and Other Protein Products Ground beef (85% or leaner), grass-fed beef, or beef trimmed of fat. Skinless chicken or [place]. Ground chicken or [place]. Pork trimmed of fat. All fish and seafood. Eggs. Dried beans, peas, or lentils. Unsalted nuts and seeds. Unsalted canned beans. Dairy Low-fat dairy products, such as skim or 1% milk, 2% or reduced-fat cheeses, low-fat ricotta or cottage cheese, or plain low-fat yogurt. Low-sodium or reduced-sodium cheeses. Fats and Oils Tub margarines without trans fats. Light or reduced-fat mayonnaise and salad dressings (reduced sodium). Avocado. Safflower, olive, or canola oils. Natural peanut or almond butter. Other Unsalted popcorn and pretzels. The items listed above may not be a complete list of recommended foods or beverages. Contact your dietitian for more options. WHAT FOODS ARE NOT RECOMMENDED? Grains  White bread. White pasta. White rice. Refined cornbread. Bagels and croissants. Crackers that contain trans fat. Vegetables Creamed or fried vegetables. Vegetables in a cheese sauce. Regular canned vegetables. Regular canned tomato sauce and paste. Regular tomato and vegetable  juices. Fruits Dried fruits. Canned fruit in light or heavy syrup. Fruit juice. Meat and Other Protein Products Fatty cuts of meat. Ribs, chicken wings, bacon, sausage, bologna, salami, chitterlings, fatback, hot dogs, bratwurst, and packaged luncheon meats. Salted nuts and seeds. Canned beans with salt. Dairy Whole or 2% milk, cream, half-and-half, and cream cheese. Whole-fat or sweetened yogurt. Full-fat cheeses or blue cheese. Nondairy creamers and whipped toppings. Processed cheese, cheese spreads, or cheese curds. Condiments Onion and garlic salt, seasoned salt, table salt, and sea salt. Canned and packaged gravies. Worcestershire sauce. Tartar sauce. Barbecue sauce. Teriyaki sauce. Soy sauce, including reduced sodium. Steak sauce. Fish sauce. Oyster sauce. Cocktail sauce. Horseradish. Ketchup and mustard. Meat flavorings and tenderizers. Bouillon cubes. Hot sauce. Tabasco sauce. Marinades. Taco seasonings. Relishes. Fats and Oils Butter, stick margarine, lard, shortening, ghee, and bacon fat. Coconut, palm kernel, or palm oils. Regular salad dressings. Other Pickles and olives. Salted popcorn and pretzels. The items listed above may not be a complete list of foods and beverages to avoid. Contact your dietitian for more information. WHERE CAN I FIND MORE INFORMATION? National Heart, Lung, and Blood Institute: travelstabloid.com Document Released: 11/05/2011 Document Revised: 04/02/2014 Document Reviewed: 09/20/2013 [facility] Patient Information 2015 [place], [place]. This information is not intended to replace advice given to you by your health care provider. Make sure you discuss any questions you have with your health care provider.

## 2015-08-30 DIAGNOSIS — N83209 Unspecified ovarian cyst, unspecified side: Secondary | ICD-10-CM | POA: Insufficient documentation

## 2015-08-30 DIAGNOSIS — E278 Other specified disorders of adrenal gland: Secondary | ICD-10-CM | POA: Insufficient documentation

## 2015-08-30 LAB — LIPID PANEL
CHOLESTEROL: 182 mg/dL (ref 125–200)
HDL: 40 mg/dL — AB (ref >=46)
LDL CALC: 122 mg/dL (ref <130)
TRIGLYCERIDES: 98 mg/dL (ref <150)
Total CHOL/HDL Ratio: 4.6 Ratio (ref <=5.0)
VLDL: 20 mg/dL (ref <30)

## 2015-08-30 LAB — COMPLETE METABOLIC PANEL WITH GFR
ALT: 9 U/L (ref 6–29)
AST: 16 U/L (ref 10–35)
Albumin: 3.6 g/dL (ref 3.6–5.1)
Alkaline Phosphatase: 58 U/L (ref 33–115)
BUN: 7 mg/dL (ref 7–25)
CHLORIDE: 105 mmol/L (ref 98–110)
CO2: 27 mmol/L (ref 20–31)
CREATININE: 0.85 mg/dL (ref 0.50–1.10)
Calcium: 8.9 mg/dL (ref 8.6–10.2)
GFR, EST NON AFRICAN AMERICAN: 83 mL/min (ref >=60)
Glucose, Bld: 82 mg/dL (ref 65–99)
POTASSIUM: 3.5 mmol/L (ref 3.5–5.3)
Sodium: 140 mmol/L (ref 135–146)
Total Bilirubin: 0.3 mg/dL (ref 0.2–1.2)
Total Protein: 7.2 g/dL (ref 6.1–8.1)

## 2015-08-30 LAB — TSH: TSH: 0.975 u[IU]/mL (ref 0.350–4.500)

## 2015-09-01 LAB — VITAMIN D 1,25 DIHYDROXY
Vitamin D 1, 25 (OH)2 Total: 47 pg/mL (ref 18–72)
Vitamin D2 1, 25 (OH)2: <8 pg/mL
Vitamin D3 1, 25 (OH)2: 47 pg/mL

## 2015-09-03 ENCOUNTER — Telehealth: Payer: Self-pay

## 2015-09-03 NOTE — Telephone Encounter (Signed)
Alexandra Powers,  Patient called asking about a referral, I don't see one in chart. Please advise. Thanks!

## 2015-09-16 ENCOUNTER — Encounter: Payer: Self-pay | Admitting: Family Medicine

## 2015-09-16 ENCOUNTER — Ambulatory Visit (INDEPENDENT_AMBULATORY_CARE_PROVIDER_SITE_OTHER): Payer: BLUE CROSS/BLUE SHIELD | Admitting: Family Medicine

## 2015-09-16 VITALS — BP 147/96 | HR 81 | Temp 98.8°F | Resp 16 | Ht 69.5 in

## 2015-09-16 DIAGNOSIS — E279 Disorder of adrenal gland, unspecified: Secondary | ICD-10-CM | POA: Diagnosis not present

## 2015-09-16 DIAGNOSIS — E278 Other specified disorders of adrenal gland: Secondary | ICD-10-CM

## 2015-09-16 MED ORDER — AMLODIPINE BESYLATE 10 MG PO TABS: 10.0000 mg | ORAL_TABLET | Freq: Every day | ORAL | Status: DC

## 2015-09-16 MED ORDER — NAPROXEN 500 MG PO TABS: 500.0000 mg | ORAL_TABLET | Freq: Two times a day (BID) | ORAL | Status: DC

## 2015-09-16 MED ORDER — ACETAMINOPHEN-CODEINE #3 300-30 MG PO TABS: 1.0000 | ORAL_TABLET | Freq: Four times a day (QID) | ORAL | Status: DC | PRN

## 2015-09-16 NOTE — Progress Notes (Signed)
[name], female   DOB: 11-06-1971, 44 y.o.   MRN: [id number]   Alexandra Powers, is a 43 y.o. female  [id number]  [id number]  DOB - 12-03-1970  CC:  Chief Complaint  Patient presents with  . Follow-up    needs a referral       HPI: Alexandra Powers is a 44 y.o. female here for follow-up on hypertension and adrenal mass. We have entered an order for referral to endocrinologist to further assess the adrenal mass. Her only complaint is of left flank pain. The adrenal mass is only 2 x 1.5 cm in size and unlikely to be causing pain. She is morbidly obese and I suspect the back pain is of a musculoskeletal nature. She reports it is aggravated by activity and relieved by rest. She has obtained shortterm relief with Tramadol but has not been taking an anti-inflammatory. She does get some relief with heat/cold.  She has a history of "cysts on her uterus" and is in need of a PAP and a mammogram. She has hypertension and is on lisinopril/hctz 20/25. Her blood pressure today is 147/96.  No Known Allergies Past Medical History  Diagnosis Date  . Hypertension    Current Outpatient Prescriptions on File Prior to Visit  Medication Sig Dispense Refill  . lisinopril-hydrochlorothiazide (PRINZIDE,ZESTORETIC) 20-25 MG tablet Take 1 tablet by mouth daily. 90 tablet 3  . traMADol (ULTRAM) 50 MG tablet Take 1 tablet (50 mg total) by mouth every 8 (eight) hours as needed. 30 tablet 0  . ibuprofen (ADVIL,MOTRIN) 800 MG tablet Take 1,600 mg by mouth daily as needed. For pain.    . [DISCONTINUED] losartan-hydrochlorothiazide (HYZAAR) 50-12.5 MG per tablet Take 1 tablet by mouth daily. 30 tablet 5   No current facility-administered medications on file prior to visit.   Family History  Problem Relation Age of Onset  . Arthritis Mother    Social History   Social History  . Marital Status: Married    Spouse Name: N/A  . Number of Children: N/A  . Years of Education: N/A   Occupational History  .  Not on file.   Social History Main Topics  . Smoking status: Current Every Day Smoker -- 0.50 packs/day for 10 years    Types: Cigarettes  . Smokeless tobacco: Not on file  . Alcohol Use: 0.0 oz/week    0 Standard drinks or equivalent per week  . Drug Use: Yes    Special: Marijuana  . Sexual Activity: Not on file   Other Topics Concern  . Not on file   Social History Narrative    Review of Systems: Constitutional: Negative  HENT: Negative  Eyes: Negative  Neck: Negative  Respiratory: Negative  Cardiovascular: Negative  Gastrointestinal: Occassional right lower quadrant pain. Genitourinary: Negative  Musculoskeletal: Positive for left flank pain. Neurological: Negative    Hematological: Negative  Psychiatric/Behavioral: Negative     Objective:   Filed Vitals:   09/16/15 1313  BP: 147/96  Pulse: 81  Temp: 98.8 F (37.1 C)  Resp: 16    Physical Exam: Constitutional: Patient appears well-developed and well-nourished. No distress. HENT: Normocephalic, atraumatic, External right and left ear normal. Oropharynx is clear and moist.  Eyes: Conjunctivae and EOM are normal. PERRLA, no scleral icterus. Neck: Normal ROM. Neck supple. No lymphadenopathy, No thyromegaly. CVS: RRR, S1/S2 +, no murmurs, no gallops, no rubs Pulmonary: Effort and breath sounds normal, no stridor, rhonchi, wheezes, rales.  Abdominal: Soft. Normoactive BS,, no distension,  rebound  or guarding. Minor tenderness over the right lower quadrant. Musculoskeletal: Normal range of motion. No edema and no tenderness.  Neuro: Alert.Normal muscle tone coordination. Non-focal Skin: Skin is warm and dry. No rash noted. Not diaphoretic. No erythema. No pallor. Psychiatric: Normal mood and affect. Behavior, judgment, thought content normal.  Lab Results  Component Value Date   WBC 10.6* 08/29/2015   HGB 12.1 08/29/2015   HCT 36.7 08/29/2015   MCV 96.1 08/29/2015   PLT 307 08/29/2015   Lab Results   Component Value Date   CREATININE 0.85 08/29/2015   BUN 7 08/29/2015   NA 140 08/29/2015   K 3.5 08/29/2015   CL 105 08/29/2015   CO2 27 08/29/2015    No results found for: HGBA1C Lipid Panel     Component Value Date/Time   CHOL 182 08/29/2015 1457   TRIG 98 08/29/2015 1457   HDL 40* 08/29/2015 1457   CHOLHDL 4.6 08/29/2015 1457   VLDL 20 08/29/2015 1457   LDLCALC 122 08/29/2015 1457       Assessment and plan:   1. Adrenal mass [facility])  - Ambulatory referral to Endocrinology   No Follow-up on file.  The patient was given clear instructions to go to ER or return to medical center if symptoms don't improve, worsen or new problems develop. The patient verbalized understanding.      Micheline Chapman, MSN, FNP-BC   09/16/2015, 2:29 PM

## 2015-09-16 NOTE — Patient Instructions (Signed)
You should be hearing from Endocrinologist about a appointment for adrenal mass in the near future Am also putting in a referral for GYN for PAP smear and well-woman exam. Call [phone] to make an appointment for mammogram Start taking amlodipine 10 mg, one po every day with your other blood pressure medication Take naproxen 500 mg twice a day with food for back pain Take Tylenol #3 1-2 po  6 hours for severe back pain

## 2015-09-26 ENCOUNTER — Ambulatory Visit: Payer: BLUE CROSS/BLUE SHIELD | Admitting: Endocrinology

## 2015-09-26 ENCOUNTER — Ambulatory Visit: Payer: BLUE CROSS/BLUE SHIELD | Admitting: Family Medicine

## 2015-09-27 ENCOUNTER — Ambulatory Visit (INDEPENDENT_AMBULATORY_CARE_PROVIDER_SITE_OTHER): Payer: BLUE CROSS/BLUE SHIELD | Admitting: Endocrinology

## 2015-09-27 ENCOUNTER — Encounter: Payer: Self-pay | Admitting: Endocrinology

## 2015-09-27 VITALS — BP 137/88 | HR 89 | Temp 98.8°F | Ht 69.5 in | Wt >= 6400 oz

## 2015-09-27 DIAGNOSIS — E279 Disorder of adrenal gland, unspecified: Secondary | ICD-10-CM | POA: Diagnosis not present

## 2015-09-27 DIAGNOSIS — E278 Other specified disorders of adrenal gland: Secondary | ICD-10-CM

## 2015-09-27 MED ORDER — DEXAMETHASONE 1 MG PO TABS: ORAL_TABLET | ORAL | Status: DC

## 2015-09-27 NOTE — Patient Instructions (Addendum)
you should do a "dexamethasone suppression test."  for this, you would take dexamethasone 1 mg at 10 pm, then come in for a "cortisol" blood test the next morning before 9 am.  you do not need to be fasting for this test. Let's also check a 24-HR urine test.   Let's also recheck the CT scan next month.   I would be happy to see you back here as necessary.

## 2015-09-27 NOTE — Progress Notes (Signed)
Subjective:    [name], female    DOB: December 27, 1970, 44 y.o.   MRN: [id number]  HPI 2 mos ago, on  CT scan, pt was incidentally noted to have a nodule in the adrenal gland.  She still has intermittent moderate pain at the left posterior chest, and assoc headache.   Past Medical History  Diagnosis Date  . Hypertension     No past surgical history on file.  Social History   Social History  . Marital Status: Married    Spouse Name: N/A  . Number of Children: N/A  . Years of Education: N/A   Occupational History  . Not on file.   Social History Main Topics  . Smoking status: Current Every Day Smoker -- 0.50 packs/day for 10 years    Types: Cigarettes  . Smokeless tobacco: Not on file  . Alcohol Use: 0.0 oz/week    0 Standard drinks or equivalent per week  . Drug Use: Yes    Special: Marijuana  . Sexual Activity: Not on file   Other Topics Concern  . Not on file   Social History Narrative    Current Outpatient Prescriptions on File Prior to Visit  Medication Sig Dispense Refill  . acetaminophen-codeine (TYLENOL #3) 300-30 MG tablet Take 1-2 tablets by mouth every 6 (six) hours as needed for moderate pain. 60 tablet 0  . amLODipine (NORVASC) 10 MG tablet Take 1 tablet (10 mg total) by mouth daily. 90 tablet 3  . ibuprofen (ADVIL,MOTRIN) 800 MG tablet Take 1,600 mg by mouth daily as needed. For pain.    Marland Kitchen lisinopril-hydrochlorothiazide (PRINZIDE,ZESTORETIC) 20-25 MG tablet Take 1 tablet by mouth daily. 90 tablet 3  . naproxen (NAPROSYN) 500 MG tablet Take 1 tablet (500 mg total) by mouth 2 (two) times daily. 60 tablet 1  . traMADol (ULTRAM) 50 MG tablet Take 1 tablet (50 mg total) by mouth every 8 (eight) hours as needed. 30 tablet 0  . [DISCONTINUED] losartan-hydrochlorothiazide (HYZAAR) 50-12.5 MG per tablet Take 1 tablet by mouth daily. 30 tablet 5   No current facility-administered medications on file prior to visit.    No Known Allergies  Family  History  Problem Relation Age of Onset  . Arthritis Mother   . Adrenal disorder Neg Hx     BP 137/88 mmHg  Pulse 89  Temp(Src) 98.8 F (37.1 C) (Oral)  Ht 5' 9.5" (1.765 m)  Wt 403 lb (182.8 kg)  BMI 58.68 kg/m2  SpO2 97%  LMP 08/07/2015    Review of Systems denies weight change, seizure, excessive diaphoresis, polyuria, sob, insomnia, hyperpigmentation, cramps, numbness, muscle weakness, and rash on the abdomen, flushing, syncope, diarrhea, visual loss, palpitations, fever, and arthralgias.  She has reg menses.  She has terminal hair on the face, nausea, rhinorrhea, and easy bruising.      Objective:   Physical Exam VS: see vs page GEN: no distress.  Morbid obesity HEAD: head: no deformity eyes: no periorbital swelling, no proptosis external nose and ears are normal mouth: no lesion seen NECK: supple, thyroid is not enlarged CHEST WALL: no deformity LUNGS:  Clear to auscultation CV: reg rate and rhythm, no murmur ABD: abdomen is soft, nontender.  no hepatosplenomegaly.  not distended.  no hernia MUSCULOSKELETAL: muscle bulk and strength are grossly normal.  no obvious joint swelling.  gait is normal and steady EXTEMITIES: no deformity.  no ulcer on the feet.  feet are of normal color and temp.  no edema  PULSES: dorsalis pedis intact bilat.  no carotid bruit NEURO:  cn 2-12 grossly intact.   readily moves all 4's.  sensation is intact to touch on the feet SKIN:  Normal texture and temperature.  No rash or suspicious lesion is visible.   NODES:  None palpable at the neck PSYCH: alert, well-oriented.  Does not appear anxious nor depressed.   Radiol: CT (07/25/15): 2 cm left adrenal nodule  Lab Results  Component Value Date   TSH 0.975 08/29/2015   I have reviewed outside records, and summarized: Pt was noted to have abnormal CT, and referred here.     Assessment & Plan:  Adrenal nodule, new, uncertain etiology.  She needs eval for hyperfunctioning adenoma and  repeat CT.  If functional eval is normal and CT is unchanged, no further eval is necessary.   Chest wall pain, not related to adrenal nodule.  This should be eval if clinically indcated  Patient is advised the following: Patient Instructions  you should do a "dexamethasone suppression test."  for this, you would take dexamethasone 1 mg at 10 pm, then come in for a "cortisol" blood test the next morning before 9 am.  you do not need to be fasting for this test. Let's also check a 24-HR urine test.   Let's also recheck the CT scan next month.   I would be happy to see you back here as necessary.

## 2015-10-04 ENCOUNTER — Other Ambulatory Visit: Payer: BLUE CROSS/BLUE SHIELD

## 2015-10-04 DIAGNOSIS — E278 Other specified disorders of adrenal gland: Secondary | ICD-10-CM

## 2015-10-08 LAB — CATECHOLAMINES, FRACTIONATED, URINE, 24 HOUR
CALCULATED TOTAL (E+ NE): 45 ug/(24.h) (ref 26–121)
Creatinine, Urine mg/day-CATEUR: 2.46 g/(24.h) (ref 0.63–2.50)
Dopamine, 24 hr Urine: 536 mcg/24 h — ABNORMAL HIGH (ref 52–480)
NOREPINEPHRINE, 24 HR UR: 45 ug/(24.h) (ref 15–100)
TOTAL VOLUME - CF 24HR U: 1000 mL

## 2015-10-09 LAB — METANEPHRINES, URINE, 24 HOUR
METANEPH TOTAL UR: 482 ug/(24.h) (ref 182–739)
Metanephrines, Ur: 101 mcg/24 h (ref 58–203)
NORMETANEPHRINE 24H UR: 381 ug/(24.h) (ref 88–649)

## 2015-10-11 ENCOUNTER — Ambulatory Visit (INDEPENDENT_AMBULATORY_CARE_PROVIDER_SITE_OTHER)
Admission: RE | Admit: 2015-10-11 | Discharge: 2015-10-11 | Disposition: A | Discharge status: Home / Self Care | Payer: BLUE CROSS/BLUE SHIELD | Source: Ambulatory Visit | Attending: Endocrinology | Admitting: Endocrinology

## 2015-10-11 DIAGNOSIS — E279 Disorder of adrenal gland, unspecified: Secondary | ICD-10-CM | POA: Diagnosis not present

## 2015-10-11 DIAGNOSIS — E278 Other specified disorders of adrenal gland: Secondary | ICD-10-CM

## 2015-10-11 MED ORDER — IOHEXOL 300 MG/ML  SOLN: 100.0000 mL | Freq: Once | INTRAMUSCULAR | Status: DC | PRN

## 2015-11-11 ENCOUNTER — Encounter: Payer: Self-pay | Admitting: Internal Medicine

## 2015-11-11 ENCOUNTER — Ambulatory Visit (INDEPENDENT_AMBULATORY_CARE_PROVIDER_SITE_OTHER): Payer: BLUE CROSS/BLUE SHIELD | Admitting: Internal Medicine

## 2015-11-11 VITALS — BP 144/100 | HR 85 | Temp 99.0°F | Resp 18 | Wt >= 6400 oz

## 2015-11-11 DIAGNOSIS — E279 Disorder of adrenal gland, unspecified: Secondary | ICD-10-CM

## 2015-11-11 DIAGNOSIS — M546 Pain in thoracic spine: Secondary | ICD-10-CM

## 2015-11-11 DIAGNOSIS — I10 Essential (primary) hypertension: Secondary | ICD-10-CM | POA: Insufficient documentation

## 2015-11-11 DIAGNOSIS — N83209 Unspecified ovarian cyst, unspecified side: Secondary | ICD-10-CM | POA: Diagnosis not present

## 2015-11-11 DIAGNOSIS — K8020 Calculus of gallbladder without cholecystitis without obstruction: Secondary | ICD-10-CM | POA: Diagnosis not present

## 2015-11-11 DIAGNOSIS — E278 Other specified disorders of adrenal gland: Secondary | ICD-10-CM

## 2015-11-11 MED ORDER — HYDROCODONE-ACETAMINOPHEN 5-325 MG PO TABS: 1.0000 | ORAL_TABLET | Freq: Four times a day (QID) | ORAL | Status: DC | PRN

## 2015-11-11 NOTE — Assessment & Plan Note (Signed)
History of ovarian cysts and experiencing lower abdominal pain She will establish with the OB/GYN-information given

## 2015-11-11 NOTE — Assessment & Plan Note (Addendum)
Elevated here today, which may be related to her pain Stressed low sodium diet, exercise, weight loss and smoking cessation Continue current medication Encouraged her to monitor her blood pressure Follow-up in 3 months, sooner if blood pressure is not controlled Continue current medications for now

## 2015-11-11 NOTE — Progress Notes (Signed)
Subjective:    [name], female    DOB: 02/06/1971, 44 y.o.   MRN: [id number]  HPI She is here to establish with a new pcp.   Abdominal pain:  She was having abdominal pain and back pain.  It started in august and she went to the ED one week after it started.  It was initially intermittent, but got worse and she went to he ED.  She had a Ct scan.  She was found to have an adrenal mass and gallstones.  She has the pain intermittently in both the epigastric region and her left middle back. There is no relation to eating or type of food as far she knows. She has the pain even without eating. She feels nausea and have vomited several times.  She has vomited 4-5 times in the past week.    Back pain:  located in the left middle back.  It can come on without the abodminal pain.  It is worse with movement.  Rest improves the pain.   she sometimes has the pain with deep breaths.  She denies any painful urination, difficulty urinating or blood in urine.   Hypertension: She is taking her medication daily. She is compliant with a low sodium diet.  She denies chest pain, palpitations, edema, shortness of breath and regular headaches. She is not currently exercising regularly due to her acute pain.  She does not monitor her blood pressure at home, but feels it is becoming better controlled.     Medications and allergies reviewed with patient and updated.  Patient Active Problem List   Diagnosis Date Noted  . HTN (hypertension), benign 11/11/2015  . Morbid obesity ([place]) 11/11/2015  . Adrenal mass ([place]) 08/30/2015  . Ovarian cyst 08/30/2015    Current Outpatient Prescriptions on File Prior to Visit  Medication Sig Dispense Refill  . amLODipine (NORVASC) 10 MG tablet Take 1 tablet (10 mg total) by mouth daily. 90 tablet 3  . lisinopril-hydrochlorothiazide (PRINZIDE,ZESTORETIC) 20-25 MG tablet Take 1 tablet by mouth daily. 90 tablet 3  . [DISCONTINUED] losartan-hydrochlorothiazide (HYZAAR)  50-12.5 MG per tablet Take 1 tablet by mouth daily. 30 tablet 5   No current facility-administered medications on file prior to visit.    Past Medical History  Diagnosis Date  . Hypertension     History reviewed. No pertinent past surgical history.  Social History   Social History  . Marital Status: Married    Spouse Name: N/A  . Number of Children: N/A  . Years of Education: N/A   Social History Main Topics  . Smoking status: Current Every Day Smoker -- 0.50 packs/day for 10 years    Types: Cigarettes  . Smokeless tobacco: None  . Alcohol Use: 0.0 oz/week    0 Standard drinks or equivalent per week     Comment: sometimes  . Drug Use: Yes    Special: Marijuana  . Sexual Activity: Not Asked   Other Topics Concern  . None   Social History Narrative    Review of Systems  Constitutional: Positive for appetite change (decreased). Negative for fever and chills.  HENT: Negative for congestion and sore throat.   Eyes: Negative for visual disturbance.  Respiratory: Positive for cough. Negative for shortness of breath and wheezing.   Gastrointestinal: Positive for nausea, vomiting, abdominal pain and constipation. Negative for diarrhea and blood in stool.       No GERD  Endocrine: Negative for polydipsia and polyuria.  Genitourinary:  Negative for dysuria, hematuria and difficulty urinating.  Musculoskeletal: Positive for back pain. Negative for myalgias and arthralgias.  Neurological: Positive for light-headedness and headaches.  Psychiatric/Behavioral: Negative for dysphoric mood. The patient is not nervous/anxious.        Objective:   Filed Vitals:   11/11/15 1003  BP: 144/100  Pulse: 85  Temp: 99 F (37.2 C)  Resp: 18   Filed Weights   11/11/15 1003  Weight: 404 lb (183.253 kg)   Body mass index is 58.82 kg/(m^2).   Physical Exam  Constitutional: No distress.  Morbidly obese  HENT:  Head: Normocephalic and atraumatic.  Eyes: Conjunctivae are normal.    Neck: Neck supple. No tracheal deviation present. No thyromegaly present.  No carotid bruit  Cardiovascular: Normal rate, regular rhythm and normal heart sounds.   No murmur heard. Pulmonary/Chest: Effort normal and breath sounds normal. No respiratory distress. She has no wheezes. She has no rales.  Abdominal: Soft. She exhibits no distension. There is tenderness (Epigastric region and lowre abdomen _ not new - from ovarian cysts). There is no rebound and no guarding.  Musculoskeletal: She exhibits no edema.  Lymphadenopathy:    She has no cervical adenopathy.  Skin: She is not diaphoretic.        Assessment & Plan:   Her back pain is likely musculoskeletal CAT scan reviewed No urinary symptoms Symptomatic relief only at this point   See problem list for assessment and plan  Follow-up in 3 months, sooner if needed

## 2015-11-11 NOTE — Progress Notes (Signed)
Pre visit review using our clinic review tool, if applicable. No additional management support is needed unless otherwise documented below in the visit note. 

## 2015-11-11 NOTE — Assessment & Plan Note (Signed)
Her gallbladder is full of gallstones and is likely the cause of her abdominal pain Referred urgently to general surgery Vicodin as needed for pain relief. Discussed the potential for addiction. Avoid driving or drinking alcoholic taking medication area and take only as directed and do not share medication. Instructed her that if her pain is severe and does not resolve she needs to go to the emergency room

## 2015-11-11 NOTE — Patient Instructions (Addendum)
  We have reviewed your prior records including labs and tests today.  We will try a different pain medication.  Please take as prescribed only if needed.   A referral for general surgery was ordered.    Please schedule followup in 3 months    [place], [place] [address] from [facility] [place], [place]

## 2015-12-05 ENCOUNTER — Ambulatory Visit: Payer: Self-pay | Admitting: General Surgery

## 2015-12-05 NOTE — H&P (Signed)
History of Present Illness Alexandra Ok MD; 12/05/2015 10:09 AM) Patient words: gallbladder.  The patient is a 45 year old female who presents for evaluation of gall stones. The patient is a 45 year old female who is referred by Dr. Billey Gosling for evaluation of symptomatic cholelithiasis.  Patient states she's had a 2 to three-month history of abdominal pain/epigastric pain that radiates the back. She states that this occurs with and after meals. Patient underwent CT scan which revealed a gallbladder filled with gallstones.  Patient has a history of morbid obesity as well as hypertension. She is currently on Norvasc and lisinopril.   Other Problems Alexandra Powers, Alexandra Powers; 12/05/2015 9:59 AM) Back Pain Cholelithiasis High blood pressure  Past Surgical History Alexandra Powers, Alexandra Powers; 12/05/2015 9:59 AM) No pertinent past surgical history  Diagnostic Studies History Alexandra Powers, Alexandra Powers; 12/05/2015 9:59 AM) Colonoscopy never Mammogram 1-3 years ago Pap Smear 1-5 years ago  Allergies Alexandra Powers, Alexandra Powers; 12/05/2015 10:00 AM) No Known Drug Allergies01/03/2016  Medication History Alexandra Powers, [name]; 12/05/2015 10:00 AM) Lisinopril-Hydrochlorothiazide (20-25MG  Tablet, Oral daily) Active. AmLODIPine Besylate (10MG  Tablet, Oral daily) Active. Medications Reconciled  Social History Alexandra Powers, Alexandra Powers; 12/05/2015 9:59 AM) Alcohol use Occasional alcohol use. Illicit drug use Uses daily. No caffeine use Tobacco use Current every day smoker.  Family History Alexandra Powers, Alexandra Powers; 12/05/2015 9:59 AM) Arthritis Mother. Hypertension Mother.  Pregnancy / Birth History Alexandra Powers, Alexandra Powers; 12/05/2015 9:59 AM) Age at menarche 49 years. Gravida 2 Maternal age 80-20 Para 2 Regular periods    Review of Systems (Alexandra Powers; 12/05/2015 9:59 AM) General Present- Appetite Loss. Not Present- Chills, Fatigue, Fever, Night Sweats, Weight Gain and Weight Loss. Skin Not Present- Change  in Wart/Mole, Dryness, Hives, Jaundice, New Lesions, Non-Healing Wounds, Rash and Ulcer. HEENT Not Present- Earache, Hearing Loss, Hoarseness, Nose Bleed, Oral Ulcers, Ringing in the Ears, Seasonal Allergies, Sinus Pain, Sore Throat, Visual Disturbances, Wears glasses/contact lenses and Yellow Eyes. Respiratory Present- Snoring. Not Present- Bloody sputum, Chronic Cough, Difficulty Breathing and Wheezing. Breast Present- Nipple Discharge. Not Present- Breast Mass, Breast Pain and Skin Changes. Cardiovascular Not Present- Chest Pain, Difficulty Breathing Lying Down, Leg Cramps, Palpitations, Rapid Heart Rate, Shortness of Breath and Swelling of Extremities. Gastrointestinal Present- Abdominal Pain, Constipation, Gets full quickly at meals and Vomiting. Not Present- Bloating, Bloody Stool, Change in Bowel Habits, Chronic diarrhea, Difficulty Swallowing, Excessive gas, Hemorrhoids, Indigestion, Nausea and Rectal Pain. Female Genitourinary Not Present- Frequency, Nocturia, Painful Urination, Pelvic Pain and Urgency. Musculoskeletal Present- Back Pain. Not Present- Joint Pain, Joint Stiffness, Muscle Pain, Muscle Weakness and Swelling of Extremities. Neurological Present- Headaches. Not Present- Decreased Memory, Fainting, Numbness, Seizures, Tingling, Tremor, Trouble walking and Weakness. Psychiatric Not Present- Anxiety, Bipolar, Change in Sleep Pattern, Depression, Fearful and Frequent crying. Endocrine Not Present- Cold Intolerance, Excessive Hunger, Hair Changes, Heat Intolerance, Hot flashes and New Diabetes. Hematology Not Present- Easy Bruising, Excessive bleeding, Gland problems, HIV and Persistent Infections.  Vitals [name] [name]; 12/05/2015 10:01 AM) 12/05/2015 10:00 AM Weight: 392.8 lb Height: 68in Body Surface Area: 2.72 m Body Mass Index: 59.72 kg/m  Temp.: 98.47F(Oral)  Pulse: 100 (Regular)  BP: 120/90 (Sitting, Left Arm, Standard)       Physical Exam Alexandra Ok, MD; 12/05/2015 10:10 AM) General Mental Status-Alert. General Appearance-Consistent with stated age. Hydration-Well hydrated. Voice-Normal.  Head and Neck Head-normocephalic, atraumatic with no lesions or palpable masses.  Eye Eyeball - Bilateral-Extraocular movements intact. Sclera/Conjunctiva - Bilateral-No scleral icterus.  Chest and Lung Exam Chest and lung  exam reveals -quiet, even and easy respiratory effort with no use of accessory muscles. Inspection Chest Wall - Normal. Back - normal.  Cardiovascular Cardiovascular examination reveals -normal heart sounds, regular rate and rhythm with no murmurs.  Abdomen Inspection Normal Exam - No Hernias. Palpation/Percussion Normal exam - Soft, Non Tender, No Rebound tenderness, No Rigidity (guarding) and No hepatosplenomegaly. Auscultation Normal exam - Bowel sounds normal.  Neurologic Neurologic evaluation reveals -alert and oriented x 3 with no impairment of recent or remote memory. Mental Status-Normal.  Musculoskeletal Normal Exam - Left-Upper Extremity Strength Normal and Lower Extremity Strength Normal. Normal Exam - Right-Upper Extremity Strength Normal, Lower Extremity Weakness.    Assessment & Plan Alexandra Ok MD; 12/05/2015 10:10 AM) SYMPTOMATIC CHOLELITHIASIS (K80.20) Impression: 45 year old female with symptomatic cholelithiasis  1. The patient like to proceed to the operating for laparoscopic cholecystectomy 2. Risks and benefits were discussed with the patient to generally include, but not limited to: infection, bleeding, possible need for post op ERCP, damage to the bile ducts, bile leak, and possible need for further surgery. Alternatives were offered and described. All questions were answered and the patient voiced understanding of the procedure and wishes to proceed at this point with a laparoscopic cholecystectomy

## 2015-12-06 ENCOUNTER — Telehealth: Payer: Self-pay

## 2015-12-06 ENCOUNTER — Telehealth: Payer: Self-pay | Admitting: *Deleted

## 2015-12-06 MED ORDER — HYDROCODONE-ACETAMINOPHEN 5-325 MG PO TABS: 1.0000 | ORAL_TABLET | Freq: Four times a day (QID) | ORAL | Status: DC | PRN

## 2015-12-06 NOTE — Telephone Encounter (Signed)
rx for hydrocodone printed, signed and at front office for patient to pick up, patient advised

## 2015-12-06 NOTE — Telephone Encounter (Signed)
Received call pt states they have not schedule her surgery to have gallstone removed. She went yesterday for her pre-op, and was told that someone will give her a call 5-7 business days , but she is out of her pain med wanting to get refill on her Hydrocodone...Johny Chess

## 2015-12-06 NOTE — Telephone Encounter (Signed)
rx for hydrocodone printed, signed and at front office

## 2015-12-06 NOTE — Telephone Encounter (Signed)
Ok rx printed.

## 2015-12-16 ENCOUNTER — Telehealth: Payer: Self-pay | Admitting: Internal Medicine

## 2015-12-16 NOTE — Telephone Encounter (Signed)
Spoke with pt to inform.  

## 2015-12-16 NOTE — Telephone Encounter (Signed)
Please advise 

## 2015-12-16 NOTE — Telephone Encounter (Signed)
Spoke with pt to inform of MDs response.  

## 2015-12-16 NOTE — Telephone Encounter (Signed)
Tried calling pt back, unable to leave message

## 2015-12-16 NOTE — Telephone Encounter (Signed)
Patient called to advise that she had her consult for surgery as discussed. She states that she is being asked to pay 650.00 for her deductible before she can have surgery. She is asking if there is anything else that she can do, because she does not have the money.

## 2015-12-16 NOTE — Telephone Encounter (Signed)
Patient was referred to [facility] for bladder surgery.  Patient states she had initial visit but could not afford cost of surgery.  She is still in pain and would like to know what Dr. Quay Burow next suggestion would be.

## 2015-12-16 NOTE — Telephone Encounter (Signed)
I do not know of anything that we can do

## 2015-12-20 ENCOUNTER — Inpatient Hospital Stay (HOSPITAL_COMMUNITY)
Admission: EM | Admit: 2015-12-20 | Discharge: 2015-12-22 | DRG: 418 | Disposition: A | Discharge status: Home / Self Care | Payer: BLUE CROSS/BLUE SHIELD | Attending: Surgery | Admitting: Surgery

## 2015-12-20 ENCOUNTER — Encounter (HOSPITAL_COMMUNITY): Payer: Self-pay | Admitting: *Deleted

## 2015-12-20 ENCOUNTER — Emergency Department (HOSPITAL_COMMUNITY): Payer: BLUE CROSS/BLUE SHIELD

## 2015-12-20 DIAGNOSIS — Z79891 Long term (current) use of opiate analgesic: Secondary | ICD-10-CM | POA: Diagnosis not present

## 2015-12-20 DIAGNOSIS — K8010 Calculus of gallbladder with chronic cholecystitis without obstruction: Secondary | ICD-10-CM | POA: Diagnosis present

## 2015-12-20 DIAGNOSIS — E876 Hypokalemia: Secondary | ICD-10-CM | POA: Diagnosis present

## 2015-12-20 DIAGNOSIS — Z6841 Body Mass Index (BMI) 40.0 and over, adult: Secondary | ICD-10-CM | POA: Diagnosis not present

## 2015-12-20 DIAGNOSIS — I10 Essential (primary) hypertension: Secondary | ICD-10-CM | POA: Diagnosis present

## 2015-12-20 DIAGNOSIS — Z79899 Other long term (current) drug therapy: Secondary | ICD-10-CM | POA: Diagnosis not present

## 2015-12-20 DIAGNOSIS — K8020 Calculus of gallbladder without cholecystitis without obstruction: Secondary | ICD-10-CM

## 2015-12-20 DIAGNOSIS — F17210 Nicotine dependence, cigarettes, uncomplicated: Secondary | ICD-10-CM | POA: Diagnosis present

## 2015-12-20 DIAGNOSIS — F1290 Cannabis use, unspecified, uncomplicated: Secondary | ICD-10-CM | POA: Diagnosis present

## 2015-12-20 HISTORY — DX: Obesity, unspecified: E66.9

## 2015-12-20 LAB — URINALYSIS, ROUTINE W REFLEX MICROSCOPIC
GLUCOSE, UA: NEGATIVE mg/dL
HGB URINE DIPSTICK: NEGATIVE
Ketones, ur: NEGATIVE mg/dL
Leukocytes, UA: NEGATIVE
Nitrite: NEGATIVE
PH: 5 (ref 5.0–8.0)
Protein, ur: NEGATIVE mg/dL
SPECIFIC GRAVITY, URINE: 1.025 (ref 1.005–1.030)

## 2015-12-20 LAB — COMPREHENSIVE METABOLIC PANEL
ALBUMIN: 3.2 g/dL — AB (ref 3.5–5.0)
ALT: 10 U/L — ABNORMAL LOW (ref 14–54)
ANION GAP: 13 (ref 5–15)
AST: 20 U/L (ref 15–41)
Alkaline Phosphatase: 62 U/L (ref 38–126)
BUN: 6 mg/dL (ref 6–20)
CO2: 27 mmol/L (ref 22–32)
Calcium: 9.3 mg/dL (ref 8.9–10.3)
Chloride: 102 mmol/L (ref 101–111)
Creatinine, Ser: 1.04 mg/dL — ABNORMAL HIGH (ref 0.44–1.00)
GFR calc Af Amer: >60 mL/min (ref >60)
GFR calc non Af Amer: >60 mL/min (ref >60)
GLUCOSE: 119 mg/dL — AB (ref 65–99)
POTASSIUM: 3.1 mmol/L — AB (ref 3.5–5.1)
SODIUM: 142 mmol/L (ref 135–145)
Total Bilirubin: 0.3 mg/dL (ref 0.3–1.2)
Total Protein: 7.7 g/dL (ref 6.5–8.1)

## 2015-12-20 LAB — CBC
HEMATOCRIT: 38.3 % (ref 36.0–46.0)
HEMOGLOBIN: 12.7 g/dL (ref 12.0–15.0)
MCH: 32.2 pg (ref 26.0–34.0)
MCHC: 33.2 g/dL (ref 30.0–36.0)
MCV: 97.2 fL (ref 78.0–100.0)
Platelets: 358 10*3/uL (ref 150–400)
RBC: 3.94 MIL/uL (ref 3.87–5.11)
RDW: 13.6 % (ref 11.5–15.5)
WBC: 12.4 10*3/uL — ABNORMAL HIGH (ref 4.0–10.5)

## 2015-12-20 LAB — I-STAT BETA HCG BLOOD, ED (MC, WL, AP ONLY): I-stat hCG, quantitative: <5 m[IU]/mL (ref <5)

## 2015-12-20 LAB — LIPASE, BLOOD: Lipase: 31 U/L (ref 11–51)

## 2015-12-20 MED ORDER — ONDANSETRON 4 MG PO TBDP: 4.0000 mg | ORAL_TABLET | Freq: Four times a day (QID) | ORAL | Status: DC | PRN

## 2015-12-20 MED ORDER — ONDANSETRON HCL 4 MG/2ML IJ SOLN
4.0000 mg | Freq: Once | INTRAMUSCULAR | Status: AC
  Administered 2015-12-20: 4 mg via INTRAVENOUS
  Filled 2015-12-20: qty 2

## 2015-12-20 MED ORDER — ONDANSETRON HCL 4 MG/2ML IJ SOLN: 4.0000 mg | Freq: Four times a day (QID) | INTRAMUSCULAR | Status: DC | PRN

## 2015-12-20 MED ORDER — SODIUM CHLORIDE 0.9 % IV BOLUS (SEPSIS)
1000.0000 mL | Freq: Once | INTRAVENOUS | Status: AC
  Administered 2015-12-20: 1000 mL via INTRAVENOUS

## 2015-12-20 MED ORDER — HYDROMORPHONE HCL 1 MG/ML IJ SOLN
1.0000 mg | Freq: Once | INTRAMUSCULAR | Status: AC
  Administered 2015-12-20: 1 mg via INTRAVENOUS
  Filled 2015-12-20: qty 1

## 2015-12-20 MED ORDER — PROMETHAZINE HCL 25 MG/ML IJ SOLN
25.0000 mg | Freq: Once | INTRAMUSCULAR | Status: AC
  Administered 2015-12-20: 25 mg via INTRAVENOUS
  Filled 2015-12-20: qty 1

## 2015-12-20 MED ORDER — POTASSIUM CHLORIDE CRYS ER 20 MEQ PO TBCR
40.0000 meq | EXTENDED_RELEASE_TABLET | Freq: Once | ORAL | Status: AC
  Administered 2015-12-20: 40 meq via ORAL
  Filled 2015-12-20: qty 2

## 2015-12-20 MED ORDER — OXYCODONE-ACETAMINOPHEN 5-325 MG PO TABS
ORAL_TABLET | ORAL | Status: AC
  Filled 2015-12-20: qty 1

## 2015-12-20 MED ORDER — HYDROMORPHONE HCL 1 MG/ML IJ SOLN
1.0000 mg | INTRAMUSCULAR | Status: DC | PRN
  Administered 2015-12-21 (×4): 1 mg via INTRAVENOUS
  Filled 2015-12-20 (×4): qty 1

## 2015-12-20 MED ORDER — OXYCODONE-ACETAMINOPHEN 5-325 MG PO TABS
1.0000 | ORAL_TABLET | Freq: Once | ORAL | Status: AC
  Administered 2015-12-20: 1 via ORAL

## 2015-12-20 NOTE — ED Notes (Signed)
Attempted report 

## 2015-12-20 NOTE — ED Notes (Signed)
Pt reports being diagnosed with gallstones and scheduled for surgery. Having n/v x 2 days and increase in abd pain since last night.

## 2015-12-20 NOTE — ED Notes (Signed)
Patient transported to Ultrasound 

## 2015-12-20 NOTE — ED Provider Notes (Signed)
CSN: [id number]     Arrival date & time 12/20/15  1315 History   First MD Initiated Contact with Patient 12/20/15 1757     Chief Complaint  Patient presents with  . Abdominal Pain  . Emesis     (Consider location/radiation/quality/duration/timing/severity/associated sxs/prior Treatment) The history is provided by the patient.  Alexandra Powers is a 45 y.o. female hx of HTN, obesity here with RUQ pain. Right upper quadrant pain intermittently for the last several months. Came in several months ago and had a CT abdomen pelvis that showed cholelithiasis but no cholecystitis. And has worsening pain when she eats for the last several months. For the last 2 days, patient has been having constant pain and unable to keep anything down. Denies any fevers. Patient states that her mother and cousin also had gallbladder removed. She followed up with surgery and is trying to arrange for outpatient elective cholecystectomy.     Past Medical History  Diagnosis Date  . Hypertension   . Obesity    History reviewed. No pertinent past surgical history. Family History  Problem Relation Age of Onset  . Arthritis Mother   . Adrenal disorder Neg Hx    Social History  Substance Use Topics  . Smoking status: Current Every Day Smoker -- 0.50 packs/day for 10 years    Types: Cigarettes  . Smokeless tobacco: None     Comment: plans on quitting new years  . Alcohol Use: 0.0 oz/week    0 Standard drinks or equivalent per week     Comment: sometimes   OB History    No data available     Review of Systems  Gastrointestinal: Positive for vomiting and abdominal pain.  All other systems reviewed and are negative.     Allergies  Review of patient's allergies indicates no known allergies.  Home Medications   Prior to Admission medications   Medication Sig Start Date End Date Taking? Authorizing Provider  amLODipine (NORVASC) 10 MG tablet Take 1 tablet (10 mg total) by mouth daily. 09/16/15  Yes Micheline Chapman, NP  HYDROcodone-acetaminophen (NORCO/VICODIN) 5-325 MG tablet Take 1-2 tablets by mouth every 6 (six) hours as needed for moderate pain. 12/06/15  Yes Binnie Rail, MD  lisinopril-hydrochlorothiazide (PRINZIDE,ZESTORETIC) 20-25 MG tablet Take 1 tablet by mouth daily. 08/29/15  Yes Micheline Chapman, NP   BP 107/64 mmHg  Pulse 70  Temp(Src) 98.8 F (37.1 C) (Oral)  Resp 16  SpO2 97%  LMP 10/31/2015 Physical Exam  Constitutional: She is oriented to person, place, and time.  Obese, uncomfortable   HENT:  Head: Normocephalic.  MM dry   Eyes: Conjunctivae are normal. Pupils are equal, round, and reactive to light.  Neck: Normal range of motion. Neck supple.  Cardiovascular: Normal rate, regular rhythm and normal heart sounds.   Pulmonary/Chest: Effort normal and breath sounds normal. No respiratory distress. She has no wheezes. She has no rales.  Abdominal: Soft. Bowel sounds are normal.  + RUQ tenderness, ? Mild Walth sign. No CVAT.   Musculoskeletal: Normal range of motion. She exhibits no edema or tenderness.  Neurological: She is alert and oriented to person, place, and time. No cranial nerve deficit. Coordination normal.  Skin: Skin is warm and dry.  Psychiatric: She has a normal mood and affect. Her behavior is normal. Judgment and thought content normal.  Nursing note and vitals reviewed.   ED Course  Procedures (including critical care time) Labs Review Labs Reviewed  COMPREHENSIVE METABOLIC PANEL -  Abnormal; Notable for the following:    Potassium 3.1 (*)    Glucose, Bld 119 (*)    Creatinine, Ser 1.04 (*)    Albumin 3.2 (*)    ALT 10 (*)    All other components within normal limits  CBC - Abnormal; Notable for the following:    WBC 12.4 (*)    All other components within normal limits  URINALYSIS, ROUTINE W REFLEX MICROSCOPIC (NOT AT [facility]) - Abnormal; Notable for the following:    Color, Urine AMBER (*)    Bilirubin Urine SMALL (*)    All other components  within normal limits  LIPASE, BLOOD  I-STAT BETA HCG BLOOD, ED ([facility], [facility], [facility] ONLY)    Imaging Review US Abdomen Limited  12/20/2015  CLINICAL DATA:  Right upper quadrant pain for 2 months with nausea and vomiting EXAM: US ABDOMEN LIMITED - RIGHT UPPER QUADRANT COMPARISON:  10/11/2015 FINDINGS: Gallbladder: As seen on scan there innumerable small gallstones. No V[place] sign. No gallbladder wall thickening. Common bile duct: Diameter: 4 mm Liver: No focal lesion identified. Within normal limits in parenchymal echogenicity. IMPRESSION: Known cholelithiasis with no acute abnormalities Electronically Signed   By: Skipper Cliche M.D.   On: 12/20/2015 19:14   I have personally reviewed and evaluated these images and lab results as part of my medical decision-making.   EKG Interpretation None      MDM   Final diagnoses:  None   Alexandra Powers is a 45 y.o. female here with right upper quadrant pain. Concerned for cholecystitis vs symptomatic cholelithiasis. Will get labs, RUQ [place] and give pain meds and reassess.   9:55 PM US showed cholelithiasis. LFTs nl. Still in pain despite multiple rounds of pain meds. Nausea improved. Will consult surgery for symptomatic chole.     Wandra Arthurs, MD 12/20/15 2206

## 2015-12-20 NOTE — H&P (Signed)
Alexandra Powers is an 45 y.o. female.   Chief Complaint: Abdominal pain, nausea, vomiting  HPI: This is a 45 yo female with a 4 month history of RUQ and epigastric abdominal pain that radiates through the back.  This is associated with nausea and vomiting.  This tends to be exacerbated by eating.   Previous CT scan documented gallbladder filled with gallstones.  She has been seen by Dr.Ramirez and was being scheduled for elective cholecystectomy.  She presents now with two days of persistent nausea, vomiting, and pain.  Unable to control symptoms in the ED.   Past Medical History  Diagnosis Date  . Hypertension   . Obesity     History reviewed. No pertinent past surgical history.  Family History  Problem Relation Age of Onset  . Arthritis Mother   . Adrenal disorder Neg Hx    Social History:  reports that she has been smoking Cigarettes.  She has a 5 pack-year smoking history. She does not have any smokeless tobacco history on file. She reports that she drinks alcohol. She reports that she uses illicit drugs (Marijuana).  Allergies: No Known Allergies  Prior to Admission medications   Medication Sig Start Date End Date Taking? Authorizing Provider  amLODipine (NORVASC) 10 MG tablet Take 1 tablet (10 mg total) by mouth daily. 09/16/15  Yes Micheline Chapman, NP  HYDROcodone-acetaminophen (NORCO/VICODIN) 5-325 MG tablet Take 1-2 tablets by mouth every 6 (six) hours as needed for moderate pain. 12/06/15  Yes Binnie Rail, MD  lisinopril-hydrochlorothiazide (PRINZIDE,ZESTORETIC) 20-25 MG tablet Take 1 tablet by mouth daily. 08/29/15  Yes Micheline Chapman, NP     Results for orders placed or performed during the hospital encounter of 12/20/15 (from the past 48 hour(s))  Lipase, blood     Status: None   Collection Time: 12/20/15  1:40 PM  Result Value Ref Range   Lipase 31 11 - 51 U/L  Comprehensive metabolic panel     Status: Abnormal   Collection Time: 12/20/15  1:40 PM  Result Value Ref  Range   Sodium 142 135 - 145 mmol/L   Potassium 3.1 (L) 3.5 - 5.1 mmol/L   Chloride 102 101 - 111 mmol/L   CO2 27 22 - 32 mmol/L   Glucose, Bld 119 (H) 65 - 99 mg/dL   BUN 6 6 - 20 mg/dL   Creatinine, Ser 1.04 (H) 0.44 - 1.00 mg/dL   Calcium 9.3 8.9 - 10.3 mg/dL   Total Protein 7.7 6.5 - 8.1 g/dL   Albumin 3.2 (L) 3.5 - 5.0 g/dL   AST 20 15 - 41 U/L   ALT 10 (L) 14 - 54 U/L   Alkaline Phosphatase 62 38 - 126 U/L   Total Bilirubin 0.3 0.3 - 1.2 mg/dL   GFR calc non Af Amer >60 >60 mL/min   GFR calc Af Amer >60 >60 mL/min    Comment: (NOTE) The eGFR has been calculated using the CKD EPI equation. This calculation has not been validated in all clinical situations. eGFR's persistently <60 mL/min signify possible Chronic Kidney Disease.    Anion gap 13 5 - 15  CBC     Status: Abnormal   Collection Time: 12/20/15  1:40 PM  Result Value Ref Range   WBC 12.4 (H) 4.0 - 10.5 K/uL   RBC 3.94 3.87 - 5.11 MIL/uL   Hemoglobin 12.7 12.0 - 15.0 g/dL   HCT 38.3 36.0 - 46.0 %   MCV 97.2 78.0 - 100.0  fL   MCH 32.2 26.0 - 34.0 pg   MCHC 33.2 30.0 - 36.0 g/dL   RDW 13.6 11.5 - 15.5 %   Platelets 358 150 - 400 K/uL  I-Stat beta hCG blood, ED ([facility], [facility], [facility] only)     Status: None   Collection Time: 12/20/15  1:46 PM  Result Value Ref Range   I-stat hCG, quantitative <5.0 <5 mIU/mL   Comment 3            Comment:   GEST. AGE      CONC.  (mIU/mL)   <=1 WEEK        5 - 50     2 WEEKS       50 - 500     3 WEEKS       100 - 10,000     4 WEEKS     1,000 - 30,000        FEMALE AND NON-PREGNANT FEMALE:     LESS THAN 5 mIU/mL   Urinalysis, Routine w reflex microscopic (not at [facility])     Status: Abnormal   Collection Time: 12/20/15  7:15 PM  Result Value Ref Range   Color, Urine AMBER (A) YELLOW    Comment: BIOCHEMICALS MAY BE AFFECTED BY COLOR   APPearance CLEAR CLEAR   Specific Gravity, Urine 1.025 1.005 - 1.030   pH 5.0 5.0 - 8.0   Glucose, UA NEGATIVE NEGATIVE mg/dL   Hgb urine dipstick  NEGATIVE NEGATIVE   Bilirubin Urine SMALL (A) NEGATIVE   Ketones, ur NEGATIVE NEGATIVE mg/dL   Protein, ur NEGATIVE NEGATIVE mg/dL   Nitrite NEGATIVE NEGATIVE   Leukocytes, UA NEGATIVE NEGATIVE    Comment: MICROSCOPIC NOT DONE ON URINES WITH NEGATIVE PROTEIN, BLOOD, LEUKOCYTES, NITRITE, OR GLUCOSE <1000 mg/dL.   US Abdomen Limited  12/20/2015  CLINICAL DATA:  Right upper quadrant pain for 2 months with nausea and vomiting EXAM: US ABDOMEN LIMITED - RIGHT UPPER QUADRANT COMPARISON:  10/11/2015 FINDINGS: Gallbladder: As seen on scan there innumerable small gallstones. No Craddock sign. No gallbladder wall thickening. Common bile duct: Diameter: 4 mm Liver: No focal lesion identified. Within normal limits in parenchymal echogenicity. IMPRESSION: Known cholelithiasis with no acute abnormalities Electronically Signed   By: Skipper Cliche M.D.   On: 12/20/2015 19:14    Review of Systems  Constitutional: Negative for weight loss.  HENT: Negative for ear discharge, ear pain, hearing loss and tinnitus.   Eyes: Negative for blurred vision, double vision, photophobia and pain.  Respiratory: Negative for cough, sputum production and shortness of breath.   Cardiovascular: Negative for chest pain.  Gastrointestinal: Positive for nausea, vomiting and abdominal pain.  Genitourinary: Negative for dysuria, urgency, frequency and flank pain.  Musculoskeletal: Negative for myalgias, back pain, joint pain, falls and neck pain.  Neurological: Negative for dizziness, tingling, sensory change, focal weakness, loss of consciousness and headaches.  Endo/Heme/Allergies: Does not bruise/bleed easily.  Psychiatric/Behavioral: Negative for depression, memory loss and substance abuse. The patient is not nervous/anxious.     Blood pressure 107/64, pulse 70, temperature 98.8 F (37.1 C), temperature source Oral, resp. rate 16, last menstrual period 10/31/2015, SpO2 97 %. Physical Exam  WDWN in NAD HEENT:  EOMI, sclera  anicteric Neck:  No masses, no thyromegaly Lungs:  CTA bilaterally; normal respiratory effort CV:  Regular rate and rhythm; no murmurs Abd:  +bowel sounds, soft, tender in RUQ; obese Ext:  Well-perfused; no edema Skin:  Warm, dry; no sign of jaundice  Assessment/Plan 1.  Symptomatic cholelithiasis, possible early cholecystitis  Admit for symptom control, IV hydration Laparoscopic cholecystectomy this admission.   Mailen Newborn K. 12/20/2015, 10:39 PM

## 2015-12-21 ENCOUNTER — Encounter (HOSPITAL_COMMUNITY): Admission: EM | Disposition: A | Discharge status: Home / Self Care | Payer: Self-pay | Source: Home / Self Care

## 2015-12-21 ENCOUNTER — Inpatient Hospital Stay (HOSPITAL_COMMUNITY): Payer: BLUE CROSS/BLUE SHIELD | Admitting: Anesthesiology

## 2015-12-21 ENCOUNTER — Encounter (HOSPITAL_COMMUNITY): Payer: Self-pay

## 2015-12-21 HISTORY — PX: CHOLECYSTECTOMY: SHX55

## 2015-12-21 LAB — COMPREHENSIVE METABOLIC PANEL
ALBUMIN: 3.1 g/dL — AB (ref 3.5–5.0)
ALK PHOS: 55 U/L (ref 38–126)
ALT: 10 U/L — AB (ref 14–54)
ANION GAP: 10 (ref 5–15)
AST: 17 U/L (ref 15–41)
BUN: 7 mg/dL (ref 6–20)
CALCIUM: 8.7 mg/dL — AB (ref 8.9–10.3)
CO2: 28 mmol/L (ref 22–32)
CREATININE: 1.04 mg/dL — AB (ref 0.44–1.00)
Chloride: 102 mmol/L (ref 101–111)
GFR calc Af Amer: >60 mL/min (ref >60)
GFR calc non Af Amer: >60 mL/min (ref >60)
GLUCOSE: 115 mg/dL — AB (ref 65–99)
Potassium: 3.1 mmol/L — ABNORMAL LOW (ref 3.5–5.1)
SODIUM: 140 mmol/L (ref 135–145)
Total Bilirubin: 0.4 mg/dL (ref 0.3–1.2)
Total Protein: 7.4 g/dL (ref 6.5–8.1)

## 2015-12-21 LAB — CBC
HEMATOCRIT: 36.8 % (ref 36.0–46.0)
HEMOGLOBIN: 12 g/dL (ref 12.0–15.0)
MCH: 31.8 pg (ref 26.0–34.0)
MCHC: 32.6 g/dL (ref 30.0–36.0)
MCV: 97.6 fL (ref 78.0–100.0)
Platelets: 335 10*3/uL (ref 150–400)
RBC: 3.77 MIL/uL — AB (ref 3.87–5.11)
RDW: 13.8 % (ref 11.5–15.5)
WBC: 12 10*3/uL — ABNORMAL HIGH (ref 4.0–10.5)

## 2015-12-21 LAB — SURGICAL PCR SCREEN
MRSA, PCR: NEGATIVE
Staphylococcus aureus: NEGATIVE

## 2015-12-21 LAB — CREATININE, SERUM
Creatinine, Ser: 1.07 mg/dL — ABNORMAL HIGH (ref 0.44–1.00)
GFR calc Af Amer: >60 mL/min (ref >60)
GFR calc non Af Amer: >60 mL/min (ref >60)

## 2015-12-21 SURGERY — LAPAROSCOPIC CHOLECYSTECTOMY WITH INTRAOPERATIVE CHOLANGIOGRAM
Anesthesia: General | Site: Abdomen

## 2015-12-21 MED ORDER — LIDOCAINE HCL (CARDIAC) 20 MG/ML IV SOLN
INTRAVENOUS | Status: DC | PRN
  Administered 2015-12-21: 80 mg via INTRATRACHEAL

## 2015-12-21 MED ORDER — NEOSTIGMINE METHYLSULFATE 10 MG/10ML IV SOLN
INTRAVENOUS | Status: DC | PRN
  Administered 2015-12-21: 3 mg via INTRAVENOUS
  Administered 2015-12-21: 2 mg via INTRAVENOUS

## 2015-12-21 MED ORDER — ONDANSETRON HCL 4 MG/2ML IJ SOLN
INTRAMUSCULAR | Status: AC
  Filled 2015-12-21: qty 2

## 2015-12-21 MED ORDER — MIDAZOLAM HCL 2 MG/2ML IJ SOLN
INTRAMUSCULAR | Status: AC
  Filled 2015-12-21: qty 2

## 2015-12-21 MED ORDER — OXYCODONE HCL 5 MG PO TABS
5.0000 mg | ORAL_TABLET | ORAL | Status: DC | PRN
  Administered 2015-12-21 – 2015-12-22 (×3): 10 mg via ORAL
  Filled 2015-12-21 (×3): qty 2

## 2015-12-21 MED ORDER — GLYCOPYRROLATE 0.2 MG/ML IJ SOLN
INTRAMUSCULAR | Status: DC | PRN
  Administered 2015-12-21: 0.6 mg via INTRAVENOUS
  Administered 2015-12-21: 0.2 mg via INTRAVENOUS

## 2015-12-21 MED ORDER — PANTOPRAZOLE SODIUM 40 MG IV SOLR
40.0000 mg | Freq: Every day | INTRAVENOUS | Status: DC
  Administered 2015-12-21 (×2): 40 mg via INTRAVENOUS
  Filled 2015-12-21 (×2): qty 40

## 2015-12-21 MED ORDER — SCOPOLAMINE 1 MG/3DAYS TD PT72
MEDICATED_PATCH | TRANSDERMAL | Status: AC
  Filled 2015-12-21: qty 1

## 2015-12-21 MED ORDER — PROMETHAZINE HCL 25 MG/ML IJ SOLN: 6.2500 mg | INTRAMUSCULAR | Status: DC | PRN

## 2015-12-21 MED ORDER — SUCCINYLCHOLINE CHLORIDE 20 MG/ML IJ SOLN
INTRAMUSCULAR | Status: DC | PRN
  Administered 2015-12-21: 120 mg via INTRAVENOUS

## 2015-12-21 MED ORDER — AMLODIPINE BESYLATE 10 MG PO TABS
10.0000 mg | ORAL_TABLET | Freq: Every day | ORAL | Status: DC
  Administered 2015-12-21 – 2015-12-22 (×2): 10 mg via ORAL
  Filled 2015-12-21 (×2): qty 1

## 2015-12-21 MED ORDER — BUPIVACAINE-EPINEPHRINE (PF) 0.25% -1:200000 IJ SOLN
INTRAMUSCULAR | Status: AC
  Filled 2015-12-21: qty 30

## 2015-12-21 MED ORDER — PROPOFOL 10 MG/ML IV BOLUS
INTRAVENOUS | Status: AC
  Filled 2015-12-21: qty 20

## 2015-12-21 MED ORDER — ARTIFICIAL TEARS OP OINT
TOPICAL_OINTMENT | OPHTHALMIC | Status: AC
  Filled 2015-12-21: qty 3.5

## 2015-12-21 MED ORDER — POTASSIUM CHLORIDE IN NACL 20-0.9 MEQ/L-% IV SOLN
INTRAVENOUS | Status: DC
  Administered 2015-12-21 – 2015-12-22 (×4): via INTRAVENOUS
  Filled 2015-12-21 (×4): qty 1000

## 2015-12-21 MED ORDER — ENOXAPARIN SODIUM 80 MG/0.8ML ~~LOC~~ SOLN
80.0000 mg | Freq: Every day | SUBCUTANEOUS | Status: DC
  Administered 2015-12-22: 80 mg via SUBCUTANEOUS
  Filled 2015-12-21: qty 0.8

## 2015-12-21 MED ORDER — ONDANSETRON HCL 4 MG/2ML IJ SOLN
INTRAMUSCULAR | Status: DC | PRN
  Administered 2015-12-21: 4 mg via INTRAVENOUS

## 2015-12-21 MED ORDER — NEOSTIGMINE METHYLSULFATE 10 MG/10ML IV SOLN
INTRAVENOUS | Status: AC
  Filled 2015-12-21: qty 1

## 2015-12-21 MED ORDER — SODIUM CHLORIDE 0.9 % IJ SOLN
INTRAMUSCULAR | Status: AC
  Filled 2015-12-21: qty 20

## 2015-12-21 MED ORDER — GLYCOPYRROLATE 0.2 MG/ML IJ SOLN
INTRAMUSCULAR | Status: AC
  Filled 2015-12-21: qty 6

## 2015-12-21 MED ORDER — EPHEDRINE SULFATE 50 MG/ML IJ SOLN
INTRAMUSCULAR | Status: AC
  Filled 2015-12-21: qty 2

## 2015-12-21 MED ORDER — DEXTROSE 5 % IV SOLN
2.0000 g | Freq: Every day | INTRAVENOUS | Status: DC
  Administered 2015-12-21 (×2): 2 g via INTRAVENOUS
  Filled 2015-12-21 (×4): qty 2

## 2015-12-21 MED ORDER — DIPHENHYDRAMINE HCL 50 MG/ML IJ SOLN: 25.0000 mg | Freq: Four times a day (QID) | INTRAMUSCULAR | Status: DC | PRN

## 2015-12-21 MED ORDER — BUPIVACAINE-EPINEPHRINE 0.25% -1:200000 IJ SOLN
INTRAMUSCULAR | Status: DC | PRN
  Administered 2015-12-21: 10 mL

## 2015-12-21 MED ORDER — LACTATED RINGERS IV SOLN
INTRAVENOUS | Status: DC
  Administered 2015-12-21: 11:00:00 via INTRAVENOUS

## 2015-12-21 MED ORDER — SCOPOLAMINE 1 MG/3DAYS TD PT72
MEDICATED_PATCH | TRANSDERMAL | Status: DC | PRN
  Administered 2015-12-21: 1 via TRANSDERMAL

## 2015-12-21 MED ORDER — LACTATED RINGERS IV SOLN
INTRAVENOUS | Status: DC | PRN
  Administered 2015-12-21 (×2): via INTRAVENOUS

## 2015-12-21 MED ORDER — SUCCINYLCHOLINE CHLORIDE 20 MG/ML IJ SOLN
INTRAMUSCULAR | Status: AC
  Filled 2015-12-21: qty 3

## 2015-12-21 MED ORDER — ROCURONIUM BROMIDE 100 MG/10ML IV SOLN
INTRAVENOUS | Status: DC | PRN
Start: 2015-12-21 — End: 2015-12-21
  Administered 2015-12-21: 50 mg via INTRAVENOUS

## 2015-12-21 MED ORDER — DIPHENHYDRAMINE HCL 25 MG PO CAPS
25.0000 mg | ORAL_CAPSULE | Freq: Four times a day (QID) | ORAL | Status: DC | PRN
Start: 2015-12-21 — End: 2015-12-22

## 2015-12-21 MED ORDER — LIDOCAINE HCL (CARDIAC) 20 MG/ML IV SOLN
INTRAVENOUS | Status: AC
  Filled 2015-12-21: qty 25

## 2015-12-21 MED ORDER — FENTANYL CITRATE (PF) 250 MCG/5ML IJ SOLN
INTRAMUSCULAR | Status: DC | PRN
  Administered 2015-12-21: 100 ug via INTRAVENOUS
  Administered 2015-12-21: 50 ug via INTRAVENOUS
  Administered 2015-12-21: 100 ug via INTRAVENOUS

## 2015-12-21 MED ORDER — 0.9 % SODIUM CHLORIDE (POUR BTL) OPTIME
TOPICAL | Status: DC | PRN
  Administered 2015-12-21: 1000 mL

## 2015-12-21 MED ORDER — GLYCOPYRROLATE 0.2 MG/ML IJ SOLN
INTRAMUSCULAR | Status: AC
  Filled 2015-12-21: qty 3

## 2015-12-21 MED ORDER — ROCURONIUM BROMIDE 50 MG/5ML IV SOLN
INTRAVENOUS | Status: AC
  Filled 2015-12-21: qty 3

## 2015-12-21 MED ORDER — HYDROMORPHONE HCL 1 MG/ML IJ SOLN
0.2500 mg | INTRAMUSCULAR | Status: DC | PRN
  Administered 2015-12-21 (×2): 0.5 mg via INTRAVENOUS

## 2015-12-21 MED ORDER — GLYCOPYRROLATE 0.2 MG/ML IJ SOLN
INTRAMUSCULAR | Status: AC
  Filled 2015-12-21: qty 1

## 2015-12-21 MED ORDER — DEXAMETHASONE SODIUM PHOSPHATE 4 MG/ML IJ SOLN
INTRAMUSCULAR | Status: AC
  Filled 2015-12-21: qty 1

## 2015-12-21 MED ORDER — SODIUM CHLORIDE 0.9 % IR SOLN
Status: DC | PRN
  Administered 2015-12-21: 1000 mL

## 2015-12-21 MED ORDER — FENTANYL CITRATE (PF) 250 MCG/5ML IJ SOLN
INTRAMUSCULAR | Status: AC
  Filled 2015-12-21: qty 5

## 2015-12-21 MED ORDER — LISINOPRIL 20 MG PO TABS
20.0000 mg | ORAL_TABLET | Freq: Every day | ORAL | Status: DC
  Administered 2015-12-21 – 2015-12-22 (×2): 20 mg via ORAL
  Filled 2015-12-21 (×2): qty 1

## 2015-12-21 MED ORDER — PROPOFOL 10 MG/ML IV BOLUS
INTRAVENOUS | Status: DC | PRN
  Administered 2015-12-21: 160 mg via INTRAVENOUS

## 2015-12-21 MED ORDER — MIDAZOLAM HCL 2 MG/2ML IJ SOLN
INTRAMUSCULAR | Status: DC | PRN
  Administered 2015-12-21: 2 mg via INTRAVENOUS

## 2015-12-21 MED ORDER — DEXAMETHASONE SODIUM PHOSPHATE 4 MG/ML IJ SOLN
INTRAMUSCULAR | Status: DC | PRN
  Administered 2015-12-21: 4 mg via INTRAVENOUS

## 2015-12-21 MED ORDER — HYDROMORPHONE HCL 1 MG/ML IJ SOLN
INTRAMUSCULAR | Status: AC
  Filled 2015-12-21: qty 1

## 2015-12-21 MED ORDER — HYDROCHLOROTHIAZIDE 25 MG PO TABS
25.0000 mg | ORAL_TABLET | Freq: Every day | ORAL | Status: DC
  Administered 2015-12-21 – 2015-12-22 (×2): 25 mg via ORAL
  Filled 2015-12-21 (×2): qty 1

## 2015-12-21 MED ORDER — LISINOPRIL-HYDROCHLOROTHIAZIDE 20-25 MG PO TABS: 1.0000 | ORAL_TABLET | Freq: Every day | ORAL | Status: DC

## 2015-12-21 SURGICAL SUPPLY — 43 items
APPLIER CLIP 5 13 M/L LIGAMAX5 (MISCELLANEOUS) ×4
BLADE SURG ROTATE 9660 (MISCELLANEOUS) IMPLANT
CANISTER SUCTION 2500CC (MISCELLANEOUS) ×2 IMPLANT
CHLORAPREP W/TINT 26ML (MISCELLANEOUS) ×2 IMPLANT
CLIP APPLIE 5 13 M/L LIGAMAX5 (MISCELLANEOUS) ×2 IMPLANT
COVER MAYO STAND STRL (DRAPES) ×6 IMPLANT
COVER SURGICAL LIGHT HANDLE (MISCELLANEOUS) ×2 IMPLANT
DRAPE C-ARM 42X72 X-RAY (DRAPES) ×2 IMPLANT
ELECT REM PT RETURN 9FT ADLT (ELECTROSURGICAL) ×2
ELECTRODE REM PT RTRN 9FT ADLT (ELECTROSURGICAL) ×1 IMPLANT
FILTER SMOKE EVAC LAPAROSHD (FILTER) IMPLANT
GLOVE BIO SURGEON STRL SZ8 (GLOVE) ×2 IMPLANT
GLOVE BIOGEL PI IND STRL 6.5 (GLOVE) ×3 IMPLANT
GLOVE BIOGEL PI IND STRL 8 (GLOVE) ×2 IMPLANT
GLOVE BIOGEL PI INDICATOR 6.5 (GLOVE) ×3
GLOVE BIOGEL PI INDICATOR 8 (GLOVE) ×2
GOWN STRL REUS W/ TWL LRG LVL3 (GOWN DISPOSABLE) ×2 IMPLANT
GOWN STRL REUS W/ TWL XL LVL3 (GOWN DISPOSABLE) ×1 IMPLANT
GOWN STRL REUS W/TWL LRG LVL3 (GOWN DISPOSABLE) ×2
GOWN STRL REUS W/TWL XL LVL3 (GOWN DISPOSABLE) ×1
KIT BASIN OR (CUSTOM PROCEDURE TRAY) ×2 IMPLANT
KIT ROOM TURNOVER OR (KITS) ×2 IMPLANT
L-HOOK LAP DISP 36CM (ELECTROSURGICAL) ×4
LHOOK LAP DISP 36CM (ELECTROSURGICAL) ×2 IMPLANT
LIQUID BAND (GAUZE/BANDAGES/DRESSINGS) ×2 IMPLANT
NEEDLE 22X1 1/2 (OR ONLY) (NEEDLE) ×2 IMPLANT
NS IRRIG 1000ML POUR BTL (IV SOLUTION) ×2 IMPLANT
PAD ARMBOARD 7.5X6 YLW CONV (MISCELLANEOUS) ×2 IMPLANT
PENCIL BUTTON HOLSTER BLD 10FT (ELECTRODE) ×2 IMPLANT
POUCH RETRIEVAL ECOSAC 10 (ENDOMECHANICALS) ×2 IMPLANT
POUCH RETRIEVAL ECOSAC 10MM (ENDOMECHANICALS) ×2
SCISSORS LAP 5X35 DISP (ENDOMECHANICALS) ×2 IMPLANT
SET CHOLANGIOGRAPH 5 50 .035 (SET/KITS/TRAYS/PACK) IMPLANT
SET IRRIG TUBING LAPAROSCOPIC (IRRIGATION / IRRIGATOR) ×2 IMPLANT
SLEEVE ENDOPATH XCEL 5M (ENDOMECHANICALS) ×4 IMPLANT
SPECIMEN JAR SMALL (MISCELLANEOUS) ×2 IMPLANT
SUT VIC AB 4-0 PS2 27 (SUTURE) ×2 IMPLANT
TOWEL OR 17X24 6PK STRL BLUE (TOWEL DISPOSABLE) ×2 IMPLANT
TOWEL OR 17X26 10 PK STRL BLUE (TOWEL DISPOSABLE) ×2 IMPLANT
TRAY LAPAROSCOPIC MC (CUSTOM PROCEDURE TRAY) ×2 IMPLANT
TROCAR XCEL BLUNT TIP 100MML (ENDOMECHANICALS) ×2 IMPLANT
TROCAR XCEL NON-BLD 5MMX100MML (ENDOMECHANICALS) ×2 IMPLANT
TUBING INSUFFLATION (TUBING) ×4 IMPLANT

## 2015-12-21 NOTE — Progress Notes (Signed)
Report called to Heath Lark, RN in short stay. Patient going to surgery.

## 2015-12-21 NOTE — Anesthesia Procedure Notes (Signed)
Procedure Name: Intubation Date/Time: 12/21/2015 11:52 AM Performed by: Marinda Elk A Pre-anesthesia Checklist: Patient identified, Timeout performed, Suction available, Emergency Drugs available and Patient being monitored Patient Re-evaluated:Patient Re-evaluated prior to inductionOxygen Delivery Method: Circle system utilized Preoxygenation: Pre-oxygenation with 100% oxygen Intubation Type: IV induction, Rapid sequence and Cricoid Pressure applied Laryngoscope Size: Mac and 3 Grade View: Grade II Tube type: Oral Tube size: 7.5 mm Number of attempts: 1 Airway Equipment and Method: Stylet Placement Confirmation: ETT inserted through vocal cords under direct vision,  breath sounds checked- equal and bilateral and positive ETCO2 Secured at: 22 cm Tube secured with: Tape Dental Injury: Teeth and Oropharynx as per pre-operative assessment

## 2015-12-21 NOTE — Op Note (Signed)
12/20/2015 - 12/21/2015  12:41 PM  PATIENT:  Alexandra Powers  45 y.o. female  PRE-OPERATIVE DIAGNOSIS:  Chronic cholecystitis and cholelithiasis  POST-OPERATIVE DIAGNOSIS:  Chronic Cholecystitis and cholelithiasis  PROCEDURE:  Procedure(s): LAPAROSCOPIC CHOLECYSTECTOMY  SURGEON:  Georganna Skeans, M.D.  ASSISTANTS: Ralene Ok, M.D.   ANESTHESIA:   local and general  EBL:  Total I/O In: 1000 [I.V.:1000] Out: 125 [Urine:125]  BLOOD ADMINISTERED:none  DRAINS: none   SPECIMEN:  Excision  DISPOSITION OF SPECIMEN:  PATHOLOGY  COUNTS:  YES  DICTATION: .Dragon Dictation Findings: Chronic cholecystitis with cholelithiasis   Procedure in detail: [name] was brought for cholecystectomy. She was identified in the preop holding area. Informed consent was obtained. She received intravenous antibiotics. She was brought to the operating room and general endotracheal anesthesia was administered by the anesthesia staff. Her abdomen was prepped and draped in sterile fashion. Time out procedure was performed. The supraumbilical region was infiltrated with local. Supraumbilical incision was made. Subcutaneous tissues were dissected down revealing the anterior fascia. This was divided sharply along the midline. Peritoneal cavity was entered under direct vision without complication. A 0 Vicryl pursestring was placed around the fascial opening. Hassan trocar was inserted into the abdomen. The abdomen was insufflated with carbon dioxide in standard fashion. Under direct vision a 5 mm epigastric and 5 mm right lateral ports 2 were placed. Local was used at each port site. Laparoscopic exploration revealed chronic inflammation of the gallbladder with filmy omental adhesions. The dome of the gallbladder was retracted superior medially. The filmy omental adhesions were stripped away from the dome, the body, and the infundibulum. Once this was done, the infundibulum was retracted inferolaterally. Dissection  began laterally and progressed medially easily identifying the cystic duct. Dissection continued until there was a critical view between the cystic duct, the gallbladder, and the liver. 3 clips were placed proximally on the cystic duct, one was placed distally and it was divided. Further dissection revealed an anterior and posterior branch of the cystic artery. These were both clipped twice proximally and divided. Gallbladder was taken off the liver bed with Bovie cautery. Excellent hemostasis was obtained along the way. Gallbladder was placed in a bag and removed from the supraumbilical port site. Liver bed was irrigated and confirmed to be dry. Clips remain in good position. Irrigation fluid was evacuated. Ports removed under direct vision. Gallbladder was sent to pathology. Pneumoperitoneum was released. Supraumbilical pursestring was tied. All 4 wounds were copiously irrigated and the skin of each was closed with running 4 Vicryl followed by liquid band. All counts were correct. Patient tolerated procedure well without apparent complications and was taken recovery in stable condition.  PATIENT DISPOSITION:  PACU - hemodynamically stable.   Delay start of Pharmacological VTE agent (>24hrs) due to surgical blood loss or risk of bleeding:  no  Georganna Skeans, MD, MPH, FACS Pager: [phone]  1/21/201712:41 PM

## 2015-12-21 NOTE — Anesthesia Preprocedure Evaluation (Addendum)
Anesthesia Evaluation  Patient identified by MRN, date of birth, ID band Patient awake    Reviewed: Allergy & Precautions, NPO status , Patient's Chart, lab work & pertinent test results  History of Anesthesia Complications Negative for: history of anesthetic complications  Airway Mallampati: III  TM Distance: >3 FB Neck ROM: Full    Dental  (+) Teeth Intact, Dental Advisory Given   Pulmonary Current Smoker,    + rhonchi        Cardiovascular hypertension, Pt. on medications  Rhythm:Regular Rate:Normal     Neuro/Psych negative neurological ROS     GI/Hepatic Neg liver ROS,   Endo/Other  negative endocrine ROSMorbid obesity  Renal/GU negative Renal ROS     Musculoskeletal   Abdominal (+) + obese,   Peds  Hematology   Anesthesia Other Findings N/v.  cholelithiasis  Reproductive/Obstetrics                            Anesthesia Physical Anesthesia Plan  ASA: III  Anesthesia Plan: General   Post-op Pain Management:    Induction: Intravenous  Airway Management Planned: Oral ETT  Additional Equipment:   Intra-op Plan:   Post-operative Plan: Extubation in OR  Informed Consent: I have reviewed the patients History and Physical, chart, labs and discussed the procedure including the risks, benefits and alternatives for the proposed anesthesia with the patient or authorized representative who has indicated his/her understanding and acceptance.   Dental advisory given  Plan Discussed with:   Anesthesia Plan Comments:         Anesthesia Quick Evaluation

## 2015-12-21 NOTE — Transfer of Care (Signed)
Immediate Anesthesia Transfer of Care Note  Patient: Alexandra Powers  Procedure(s) Performed: Procedure(s): LAPAROSCOPIC CHOLECYSTECTOMY (N/A)  Patient Location: PACU  Anesthesia Type:General  Level of Consciousness: awake  Airway & Oxygen Therapy: Patient Spontanous Breathing and Patient connected to nasal cannula oxygen  Post-op Assessment: Report given to RN and Post -op Vital signs reviewed and stable  Post vital signs: Reviewed and stable  Last Vitals:  Filed Vitals:   12/21/15 0509 12/21/15 1007  BP: 137/76 118/70  Pulse: 64 72  Temp: 36.6 C 36.4 C  Resp: 18 18    Complications: No apparent anesthesia complications

## 2015-12-21 NOTE — Progress Notes (Signed)
[name], female   DOB: 1971/08/10, 45 y.o.   MRN: [id number] Patient examined. Symptomatic cholelithiasis. I D/W Dr. Donne Hazel and Dr. Rosendo Gros. Will proceed with lap chole this AM. Procedure, risks, and benefits discussed. She agrees. Georganna Skeans, MD, MPH, FACS Trauma: [phone] General Surgery: [phone]

## 2015-12-21 NOTE — Progress Notes (Signed)
  Subjective: Feels ok today  Objective: Vital signs in last 24 hours: Temp:  [97.6 F (36.4 C)-98.8 F (37.1 C)] 97.6 F (36.4 C) (01/21 1007) Pulse Rate:  [7-110] 7 (01/21 1007) Resp:  [14-20] 18 (01/21 0509) BP: (100-147)/(44-100) 137/76 mmHg (01/21 0509) SpO2:  [95 %-100 %] 100 % (01/21 0509) Weight:  [179.6 kg (395 lb 15.1 oz)] 179.6 kg (395 lb 15.1 oz) (01/21 0017) Last BM Date: 12/20/15  Intake/Output from previous day: 01/20 0701 - 01/21 0700 In: 720 [I.V.:670; IV Piggyback:50] Out: -  Intake/Output this shift:    GI: mild ruq pain  Lab Results:   Recent Labs  12/20/15 1340 12/21/15 0019  WBC 12.4* 12.0*  HGB 12.7 12.0  HCT 38.3 36.8  PLT 358 335   BMET  Recent Labs  12/20/15 1340 12/21/15 0019 12/21/15 0107  NA 142  --  140  K 3.1*  --  3.1*  CL 102  --  102  CO2 27  --  28  GLUCOSE 119*  --  115*  BUN 6  --  7  CREATININE 1.04* 1.07* 1.04*  CALCIUM 9.3  --  8.7*   PT/INR No results for input(s): LABPROT, INR in the last 72 hours. ABG No results for input(s): PHART, HCO3 in the last 72 hours.  Invalid input(s): PCO2, PO2  Studies/Results: US Abdomen Limited  12/20/2015  CLINICAL DATA:  Right upper quadrant pain for 2 months with nausea and vomiting EXAM: US ABDOMEN LIMITED - RIGHT UPPER QUADRANT COMPARISON:  10/11/2015 FINDINGS: Gallbladder: As seen on scan there innumerable small gallstones. No Loredo sign. No gallbladder wall thickening. Common bile duct: Diameter: 4 mm Liver: No focal lesion identified. Within normal limits in parenchymal echogenicity. IMPRESSION: Known cholelithiasis with no acute abnormalities Electronically Signed   By: Skipper Cliche M.D.   On: 12/20/2015 19:14    Anti-infectives: Anti-infectives    Start     Dose/Rate Route Frequency Ordered Stop   12/21/15 0100  cefTRIAXone (ROCEPHIN) 2 g in dextrose 5 % 50 mL IVPB     2 g 100 mL/hr over 30 Minutes Intravenous Daily at bedtime 12/21/15 0014         Assessment/Plan: Sx cholelithiasis  Lap chole today  Alexandra Powers 12/21/2015

## 2015-12-21 NOTE — Discharge Instructions (Signed)
Laparoscopic Cholecystectomy, Care After °Refer to this sheet in the next few weeks. These instructions provide you with information about caring for yourself after your procedure. Your health care provider may also give you more specific instructions. Your treatment has been planned according to current medical practices, but problems sometimes occur. Call your health care provider if you have any problems or questions after your procedure. °WHAT TO EXPECT AFTER THE PROCEDURE °After your procedure, it is common to have: °· Pain at your incision sites. You will be given pain medicines to control your pain. °· Mild nausea or vomiting. This should improve after the first 24 hours. °· Bloating and possible shoulder pain from the gas that was used during the procedure. This will improve after the first 24 hours. °HOME CARE INSTRUCTIONS °Incision Care °· Follow instructions from your health care provider about how to take care of your incisions. Make sure you: °¨ Wash your hands with soap and water before you change your bandage (dressing). If soap and water are not available, use hand sanitizer. °¨ Change your dressing as told by your health care provider. °¨ Leave stitches (sutures), skin glue, or adhesive strips in place. These skin closures may need to be in place for 2 weeks or longer. If adhesive strip edges start to loosen and curl up, you may trim the loose edges. Do not remove adhesive strips completely unless your health care provider tells you to do that. °· Do not take baths, swim, or use a hot tub until your health care provider approves. Ask your health care provider if you can take showers. You may only be allowed to take sponge baths for bathing. °General Instructions °· Take over-the-counter and prescription medicines only as told by your health care provider. °· Do not drive or operate heavy machinery while taking prescription pain medicine. °· Return to your normal diet as told by your health care  provider. °· Do not lift anything that is heavier than 10 lb (4.5 kg). °· Do not play contact sports for one week or until your health care provider approves. °SEEK MEDICAL CARE IF:  °· You have redness, swelling, or pain at the site of your incision. °· You have fluid, blood, or pus coming from your incision. °· You notice a bad smell coming from your incision area. °· Your surgical incisions break open. °· You have a fever. °SEEK IMMEDIATE MEDICAL CARE IF: °· You develop a rash. °· You have difficulty breathing. °· You have chest pain. °· You have increasing pain in your shoulders (shoulder strap areas). °· You faint or have dizzy episodes while you are standing. °· You have severe pain in your abdomen. °· You have nausea or vomiting that lasts for more than one day. °  °This information is not intended to replace advice given to you by your health care provider. Make sure you discuss any questions you have with your health care provider. °  °Document Released: 11/16/2005 Document Revised: 08/07/2015 Document Reviewed: 06/28/2013 °Elsevier Interactive Patient Education ©2016 [employer] °CCS ______[facility] °LAPAROSCOPIC SURGERY: POST OP INSTRUCTIONS °Always review your discharge instruction sheet given to you by the facility where your surgery was performed. °IF YOU HAVE DISABILITY OR FAMILY LEAVE FORMS, YOU MUST BRING THEM TO THE OFFICE FOR PROCESSING.   °DO NOT GIVE THEM TO YOUR DOCTOR. ° °1. A prescription for pain medication may be given to you upon discharge.  Take your pain medication as prescribed, if needed.  If narcotic   pain medicine is not needed, then you may take acetaminophen (Tylenol) or ibuprofen (Advil) as needed. °2. Take your usually prescribed medications unless otherwise directed. °3. If you need a refill on your pain medication, please contact your pharmacy.  They will contact our office to request authorization. Prescriptions will not be filled after 5pm or on  week-ends. °4. You should follow a light diet the first few days after arrival home, such as soup and crackers, etc.  Be sure to include lots of fluids daily. °5. Most patients will experience some swelling and bruising in the area of the incisions.  Ice packs will help.  Swelling and bruising can take several days to resolve.  °6. It is common to experience some constipation if taking pain medication after surgery.  Increasing fluid intake and taking a stool softener (such as Colace) will usually help or prevent this problem from occurring.  A mild laxative (Milk of Magnesia or Miralax) should be taken according to package instructions if there are no bowel movements after 48 hours. °7. Unless discharge instructions indicate otherwise, you may remove your bandages 24-48 hours after surgery, and you may shower at that time.  You may have steri-strips (small skin tapes) in place directly over the incision.  These strips should be left on the skin for 7-10 days.  If your surgeon used skin glue on the incision, you may shower in 24 hours.  The glue will flake off over the next 2-3 weeks.  Any sutures or staples will be removed at the office during your follow-up visit. °8. ACTIVITIES:  You may resume regular (light) daily activities beginning the next day--such as daily self-care, walking, climbing stairs--gradually increasing activities as tolerated.  You may have sexual intercourse when it is comfortable.  Refrain from any heavy lifting or straining until approved by your doctor. °a. You may drive when you are no longer taking prescription pain medication, you can comfortably wear a seatbelt, and you can safely maneuver your car and apply brakes. °b. RETURN TO WORK:  __________________________________________________________ °9. You should see your doctor in the office for a follow-up appointment approximately 2-3 weeks after your surgery.  Make sure that you call for this appointment within a day or two after you  arrive home to insure a convenient appointment time. °10. OTHER INSTRUCTIONS: __________________________________________________________________________________________________________________________ __________________________________________________________________________________________________________________________ °WHEN TO CALL YOUR DOCTOR: °1. Fever over 101.0 °2. Inability to urinate °3. Continued bleeding from incision. °4. Increased pain, redness, or drainage from the incision. °5. Increasing abdominal pain ° °The clinic staff is available to answer your questions during regular business hours.  Please don’t hesitate to call and ask to speak to one of the nurses for clinical concerns.  If you have a medical emergency, go to the nearest emergency room or call 911.  A surgeon from [facility] is always on call at the hospital. °[address], [address], [place], [place]  [address] ? [address] [address], [place], [place]   [address] °[phone] ? [phone] ? FAX [phone] °Web site: [email or web address] ° °

## 2015-12-21 NOTE — Anesthesia Postprocedure Evaluation (Signed)
Anesthesia Post Note  Patient: Alexandra Powers  Procedure(s) Performed: Procedure(s) (LRB): LAPAROSCOPIC CHOLECYSTECTOMY (N/A)  Patient location during evaluation: PACU Anesthesia Type: General Level of consciousness: awake and alert Pain management: pain level controlled Vital Signs Assessment: post-procedure vital signs reviewed and stable Respiratory status: spontaneous breathing, nonlabored ventilation, respiratory function stable and patient connected to nasal cannula oxygen Cardiovascular status: blood pressure returned to baseline and stable Postop Assessment: no signs of nausea or vomiting Anesthetic complications: no    Last Vitals:  Filed Vitals:   12/21/15 1343 12/21/15 1414  BP:  104/73  Pulse: 85 91  Temp: 36.8 C 36.5 C  Resp: 20 20    Last Pain:  Filed Vitals:   12/21/15 1558  PainSc: Asleep                 Camiya Vinal,JAMES TERRILL

## 2015-12-21 NOTE — Progress Notes (Signed)
Received patient from ED.  Patient is ambulatory, AOx4, VS stable, pain at 6/10 and O2Sat at 100% on RA.  CNA oriented patient to room, bed controls and call light.  Patient voided in bathroom and resting on bed comfortably with both eyes closed after administering PRN Dilaudid 1mg  Inj for pain. Will monitor.

## 2015-12-22 MED ORDER — HYDROCODONE-ACETAMINOPHEN 5-325 MG PO TABS: 1.0000 | ORAL_TABLET | Freq: Four times a day (QID) | ORAL | Status: DC | PRN

## 2015-12-22 MED ORDER — ACETAMINOPHEN 325 MG PO TABS: ORAL_TABLET | ORAL | Status: DC

## 2015-12-22 MED ORDER — PANTOPRAZOLE SODIUM 40 MG PO TBEC: 40.0000 mg | DELAYED_RELEASE_TABLET | Freq: Every day | ORAL | Status: DC

## 2015-12-22 NOTE — Progress Notes (Signed)
Discharge instructions gone over with patient. Home medications gone over. Follow up appointment is made. Prescription given. Diet, activity, and incisional care gone over. Signs and symptoms of infection and reasons to call the doctor gone over. Patient verbalized understanding of instructions.

## 2015-12-22 NOTE — Progress Notes (Signed)
1 Day Post-Op  Subjective: She is doing well eating a regular breakfast, sites look good.   Objective: Vital signs in last 24 hours: Temp:  [97.6 F (36.4 C)-98.3 F (36.8 C)] 97.8 F (36.6 C) (01/22 0325) Pulse Rate:  [72-92] 80 (01/22 0325) Resp:  [15-20] 17 (01/22 0325) BP: (100-127)/(64-73) 127/68 mmHg (01/22 0325) SpO2:  [96 %-100 %] 97 % (01/22 0325) Last BM Date: 12/20/15 700 PO 875 urine recorded Afebrile, VSS K+ down yesterday No labs today Intake/Output from previous day: 01/21 0701 - 01/22 0700 In: 4583.3 [P.O.:700; I.V.:3883.3] Out: 885 [Urine:875; Blood:10] Intake/Output this shift:    General appearance: alert, cooperative and no distress GI: sore, sites all look good, tolerating diet.  Lab Results:   Recent Labs  12/20/15 1340 12/21/15 0019  WBC 12.4* 12.0*  HGB 12.7 12.0  HCT 38.3 36.8  PLT 358 335    BMET  Recent Labs  12/20/15 1340 12/21/15 0019 12/21/15 0107  NA 142  --  140  K 3.1*  --  3.1*  CL 102  --  102  CO2 27  --  28  GLUCOSE 119*  --  115*  BUN 6  --  7  CREATININE 1.04* 1.07* 1.04*  CALCIUM 9.3  --  8.7*   PT/INR No results for input(s): LABPROT, INR in the last 72 hours.   Recent Labs Lab 12/20/15 1340 12/21/15 0107  AST 20 17  ALT 10* 10*  ALKPHOS 62 55  BILITOT 0.3 0.4  PROT 7.7 7.4  ALBUMIN 3.2* 3.1*     Lipase     Component Value Date/Time   LIPASE 31 12/20/2015 1340     Studies/Results: US Abdomen Limited  12/20/2015  CLINICAL DATA:  Right upper quadrant pain for 2 months with nausea and vomiting EXAM: US ABDOMEN LIMITED - RIGHT UPPER QUADRANT COMPARISON:  10/11/2015 FINDINGS: Gallbladder: As seen on scan there innumerable small gallstones. No Woon sign. No gallbladder wall thickening. Common bile duct: Diameter: 4 mm Liver: No focal lesion identified. Within normal limits in parenchymal echogenicity. IMPRESSION: Known cholelithiasis with no acute abnormalities Electronically Signed   By: Skipper Cliche M.D.   On: 12/20/2015 19:14    Medications: . amLODipine  10 mg Oral Daily  . cefTRIAXone (ROCEPHIN)  IV  2 g Intravenous QHS  . enoxaparin (LOVENOX) injection  80 mg Subcutaneous Daily  . lisinopril  20 mg Oral Daily   And  . hydrochlorothiazide  25 mg Oral Daily  . pantoprazole (PROTONIX) IV  40 mg Intravenous QHS   . 0.9 % NaCl with KCl 20 mEq / L 125 mL/hr at 12/22/15 0454  . lactated ringers 10 mL/hr at 12/21/15 1106   Prior to Admission medications   Medication Sig Start Date End Date Taking? Authorizing Provider  amLODipine (NORVASC) 10 MG tablet Take 1 tablet (10 mg total) by mouth daily. 09/16/15  Yes Micheline Chapman, NP  HYDROcodone-acetaminophen (NORCO/VICODIN) 5-325 MG tablet Take 1-2 tablets by mouth every 6 (six) hours as needed for moderate pain. 12/06/15  Yes Binnie Rail, MD  lisinopril-hydrochlorothiazide (PRINZIDE,ZESTORETIC) 20-25 MG tablet Take 1 tablet by mouth daily. 08/29/15  Yes Micheline Chapman, NP    Assessment/Plan Chronic Cholecystitis and cholelithiasis S/p LAPAROSCOPIC CHOLECYSTECTOMY, 12/21/15, Dr. Georganna Skeans Hypertension Body mass index is 60. Antibiotics: Rocephin x 2 days completed DVT:  Lovenox/SCD   Plan:  Home later this AM.   LOS: 2 days    Auston Halfmann 12/22/2015

## 2015-12-22 NOTE — Discharge Summary (Signed)
Physician Discharge Summary  [name] MRN: [id number] DOB/AGE: 46-23-1972 46 y.o.  Admit date: 12/20/2015 Discharge date: 12/22/2015  Admission Diagnoses:  Chronic Cholecystitis and cholelithiasis Hypertension Body mass index is 60.   Discharge Diagnoses:  Same  Principal Problem:   Chronic cholecystitis with calculus Active Problems:   HTN (hypertension), benign   Morbid obesity ([place])   PROCEDURES:  LAPAROSCOPIC CHOLECYSTECTOMY, 12/21/15, Dr. Mallie Darting Course: This is a 45 yo female with a 4 month history of RUQ and epigastric abdominal pain that radiates through the back. This is associated with nausea and vomiting. This tends to be exacerbated by eating. Previous CT scan documented gallbladder filled with gallstones. She has been seen by Dr.Ramirez and was being scheduled for elective cholecystectomy. She presents now with two days of persistent nausea, vomiting, and pain. Unable to control symptoms in the ED. She was seen in the ED and admitted.  She was taken to the OR on 12/21/15, she tolerated the procedure well.  She did have some mild hypokalemia, and was treated with oral potassium.  By the first post op morning she was doing well and ready for discharge before lunch on 09/20/16.  Condition on d/c:  Improved  CMP Latest Ref Rng 12/21/2015 12/21/2015 12/20/2015  Glucose 65 - 99 mg/dL 115(H) - 119(H)  BUN 6 - 20 mg/dL 7 - 6  Creatinine 0.44 - 1.00 mg/dL 1.04(H) 1.07(H) 1.04(H)  Sodium 135 - 145 mmol/L 140 - 142  Potassium 3.5 - 5.1 mmol/L 3.1(L) - 3.1(L)  Chloride 101 - 111 mmol/L 102 - 102  CO2 22 - 32 mmol/L 28 - 27  Calcium 8.9 - 10.3 mg/dL 8.7(L) - 9.3  Total Protein 6.5 - 8.1 g/dL 7.4 - 7.7  Total Bilirubin 0.3 - 1.2 mg/dL 0.4 - 0.3  Alkaline Phos 38 - 126 U/L 55 - 62  AST 15 - 41 U/L 17 - 20  ALT 14 - 54 U/L 10(L) - 10(L)     Disposition: 01-Home or Self Care     Medication List    TAKE these medications        acetaminophen 325 MG tablet  Commonly known as:  TYLENOL  You can take plain tylenol (acetaminophen) or you can take the pain pill prescribed which also has Tylenol in it.  You cannot take more than 4000 mg of Tylenol per day so you have to count what is in your prescription pill also.     amLODipine 10 MG tablet  Commonly known as:  NORVASC  Take 1 tablet (10 mg total) by mouth daily.     HYDROcodone-acetaminophen 5-325 MG tablet  Commonly known as:  NORCO/VICODIN  Take 1-2 tablets by mouth every 6 (six) hours as needed for moderate pain.     lisinopril-hydrochlorothiazide 20-25 MG tablet  Commonly known as:  PRINZIDE,ZESTORETIC  Take 1 tablet by mouth daily.       Follow-up Information    Follow up with [facility] On 01/07/2016.   Specialty:  General Surgery   Why:  Your appointment, is at 9:40 AM, be there 30 minutes early for check in.   Contact information:   [place] [address] [phone]       Follow up with Binnie Rail, MD.   Specialty:  Internal Medicine   Why:  CAll Monday and tell them your Potassium was low in the hospital and let them make further recommendations.   Contact information:  [place] [address] [phone]       Signed: Earnstine Regal 12/22/2015, 10:56 AM

## 2015-12-23 ENCOUNTER — Encounter (HOSPITAL_COMMUNITY): Payer: Self-pay | Admitting: General Surgery

## 2015-12-26 ENCOUNTER — Encounter (HOSPITAL_COMMUNITY): Payer: Self-pay

## 2015-12-26 DIAGNOSIS — K420 Umbilical hernia with obstruction, without gangrene: Secondary | ICD-10-CM | POA: Diagnosis not present

## 2015-12-26 DIAGNOSIS — Z6841 Body Mass Index (BMI) 40.0 and over, adult: Secondary | ICD-10-CM | POA: Diagnosis not present

## 2015-12-26 DIAGNOSIS — F17210 Nicotine dependence, cigarettes, uncomplicated: Secondary | ICD-10-CM | POA: Insufficient documentation

## 2015-12-26 DIAGNOSIS — I10 Essential (primary) hypertension: Secondary | ICD-10-CM | POA: Diagnosis not present

## 2015-12-26 DIAGNOSIS — G8918 Other acute postprocedural pain: Secondary | ICD-10-CM | POA: Diagnosis present

## 2015-12-26 NOTE — ED Notes (Signed)
Pt had her gallbladder removed on Saturday and tonight started having upper abd pain with nausea and dizziness. No vomiting or diarrhea. Reports chills but no fever. No BM today and usually goes daily. Decreased appetite.

## 2015-12-27 ENCOUNTER — Emergency Department (HOSPITAL_COMMUNITY): Payer: BLUE CROSS/BLUE SHIELD

## 2015-12-27 ENCOUNTER — Observation Stay (HOSPITAL_COMMUNITY): Payer: BLUE CROSS/BLUE SHIELD | Admitting: Certified Registered Nurse Anesthetist

## 2015-12-27 ENCOUNTER — Observation Stay (HOSPITAL_COMMUNITY)
Admission: EM | Admit: 2015-12-27 | Discharge: 2015-12-28 | Disposition: A | Discharge status: Home / Self Care | Payer: BLUE CROSS/BLUE SHIELD | Attending: Surgery | Admitting: Surgery

## 2015-12-27 ENCOUNTER — Encounter (HOSPITAL_COMMUNITY)
Admission: EM | Disposition: A | Discharge status: Home / Self Care | Payer: Self-pay | Source: Home / Self Care | Attending: Emergency Medicine

## 2015-12-27 ENCOUNTER — Encounter (HOSPITAL_COMMUNITY): Payer: Self-pay | Admitting: Certified Registered Nurse Anesthetist

## 2015-12-27 DIAGNOSIS — F17210 Nicotine dependence, cigarettes, uncomplicated: Secondary | ICD-10-CM | POA: Diagnosis not present

## 2015-12-27 DIAGNOSIS — G8918 Other acute postprocedural pain: Secondary | ICD-10-CM

## 2015-12-27 DIAGNOSIS — K460 Unspecified abdominal hernia with obstruction, without gangrene: Secondary | ICD-10-CM | POA: Diagnosis present

## 2015-12-27 DIAGNOSIS — I10 Essential (primary) hypertension: Secondary | ICD-10-CM | POA: Diagnosis not present

## 2015-12-27 DIAGNOSIS — K420 Umbilical hernia with obstruction, without gangrene: Secondary | ICD-10-CM | POA: Diagnosis not present

## 2015-12-27 HISTORY — PX: UMBILICAL HERNIA REPAIR: SHX196

## 2015-12-27 LAB — URINALYSIS, ROUTINE W REFLEX MICROSCOPIC
BILIRUBIN URINE: NEGATIVE
Glucose, UA: NEGATIVE mg/dL
HGB URINE DIPSTICK: NEGATIVE
KETONES UR: NEGATIVE mg/dL
Leukocytes, UA: NEGATIVE
NITRITE: NEGATIVE
PH: 6 (ref 5.0–8.0)
Protein, ur: NEGATIVE mg/dL
Specific Gravity, Urine: 1.025 (ref 1.005–1.030)

## 2015-12-27 LAB — COMPREHENSIVE METABOLIC PANEL
ALBUMIN: 3.2 g/dL — AB (ref 3.5–5.0)
ALT: 54 U/L (ref 14–54)
ANION GAP: 12 (ref 5–15)
AST: 176 U/L — AB (ref 15–41)
Alkaline Phosphatase: 75 U/L (ref 38–126)
BILIRUBIN TOTAL: 0.5 mg/dL (ref 0.3–1.2)
BUN: 9 mg/dL (ref 6–20)
CHLORIDE: 102 mmol/L (ref 101–111)
CO2: 25 mmol/L (ref 22–32)
Calcium: 9.2 mg/dL (ref 8.9–10.3)
Creatinine, Ser: 1.1 mg/dL — ABNORMAL HIGH (ref 0.44–1.00)
GFR calc Af Amer: >60 mL/min (ref >60)
GFR calc non Af Amer: 60 mL/min — ABNORMAL LOW (ref >60)
GLUCOSE: 115 mg/dL — AB (ref 65–99)
POTASSIUM: 3.3 mmol/L — AB (ref 3.5–5.1)
SODIUM: 139 mmol/L (ref 135–145)
TOTAL PROTEIN: 7.8 g/dL (ref 6.5–8.1)

## 2015-12-27 LAB — CBC
HCT: 37.3 % (ref 36.0–46.0)
HEMOGLOBIN: 12.5 g/dL (ref 12.0–15.0)
MCH: 32 pg (ref 26.0–34.0)
MCHC: 33.5 g/dL (ref 30.0–36.0)
MCV: 95.4 fL (ref 78.0–100.0)
PLATELETS: 343 10*3/uL (ref 150–400)
RBC: 3.91 MIL/uL (ref 3.87–5.11)
RDW: 13.7 % (ref 11.5–15.5)
WBC: 12.2 10*3/uL — ABNORMAL HIGH (ref 4.0–10.5)

## 2015-12-27 LAB — LIPASE, BLOOD: LIPASE: 39 U/L (ref 11–51)

## 2015-12-27 LAB — PREGNANCY, URINE: Preg Test, Ur: NEGATIVE

## 2015-12-27 SURGERY — REPAIR, HERNIA, UMBILICAL, ADULT
Anesthesia: General | Site: Abdomen

## 2015-12-27 MED ORDER — PROMETHAZINE HCL 25 MG/ML IJ SOLN
INTRAMUSCULAR | Status: AC
  Administered 2015-12-27: 6.25 mg
  Filled 2015-12-27: qty 1

## 2015-12-27 MED ORDER — SODIUM CHLORIDE 0.9 % IV SOLN
INTRAVENOUS | Status: DC
Start: 2015-12-27 — End: 2015-12-28
  Administered 2015-12-27: 14:00:00 via INTRAVENOUS
  Administered 2015-12-27: 1 mL via INTRAVENOUS

## 2015-12-27 MED ORDER — CEFAZOLIN SODIUM-DEXTROSE 2-3 GM-% IV SOLR
2.0000 g | Freq: Three times a day (TID) | INTRAVENOUS | Status: DC
  Administered 2015-12-27: 2 g via INTRAVENOUS
  Filled 2015-12-27 (×3): qty 50

## 2015-12-27 MED ORDER — ONDANSETRON 4 MG PO TBDP
4.0000 mg | ORAL_TABLET | Freq: Four times a day (QID) | ORAL | Status: DC | PRN
  Filled 2015-12-27: qty 1

## 2015-12-27 MED ORDER — LACTATED RINGERS IV SOLN
INTRAVENOUS | Status: DC | PRN
  Administered 2015-12-27: 10:00:00 via INTRAVENOUS

## 2015-12-27 MED ORDER — LISINOPRIL-HYDROCHLOROTHIAZIDE 20-25 MG PO TABS: 1.0000 | ORAL_TABLET | Freq: Every day | ORAL | Status: DC

## 2015-12-27 MED ORDER — ROCURONIUM BROMIDE 100 MG/10ML IV SOLN
INTRAVENOUS | Status: DC | PRN
Start: 2015-12-27 — End: 2015-12-27
  Administered 2015-12-27: 30 mg via INTRAVENOUS

## 2015-12-27 MED ORDER — PROPOFOL 10 MG/ML IV BOLUS
INTRAVENOUS | Status: DC | PRN
  Administered 2015-12-27: 160 mg via INTRAVENOUS

## 2015-12-27 MED ORDER — MIDAZOLAM HCL 5 MG/5ML IJ SOLN
INTRAMUSCULAR | Status: DC | PRN
  Administered 2015-12-27: 2 mg via INTRAVENOUS

## 2015-12-27 MED ORDER — HYDROCODONE-ACETAMINOPHEN 5-325 MG PO TABS
1.0000 | ORAL_TABLET | ORAL | Status: DC | PRN
  Administered 2015-12-27 – 2015-12-28 (×2): 2 via ORAL
  Filled 2015-12-27 (×2): qty 2

## 2015-12-27 MED ORDER — AMLODIPINE BESYLATE 10 MG PO TABS
10.0000 mg | ORAL_TABLET | Freq: Every day | ORAL | Status: DC
  Administered 2015-12-27 – 2015-12-28 (×2): 10 mg via ORAL
  Filled 2015-12-27 (×2): qty 1

## 2015-12-27 MED ORDER — 0.9 % SODIUM CHLORIDE (POUR BTL) OPTIME
TOPICAL | Status: DC | PRN
  Administered 2015-12-27: 1000 mL

## 2015-12-27 MED ORDER — EPHEDRINE SULFATE 50 MG/ML IJ SOLN
INTRAMUSCULAR | Status: DC | PRN
  Administered 2015-12-27: 5 mg via INTRAVENOUS

## 2015-12-27 MED ORDER — SODIUM CHLORIDE 0.9 % IV SOLN
INTRAVENOUS | Status: DC
  Administered 2015-12-27: 08:00:00 via INTRAVENOUS

## 2015-12-27 MED ORDER — FENTANYL CITRATE (PF) 100 MCG/2ML IJ SOLN
INTRAMUSCULAR | Status: DC | PRN
  Administered 2015-12-27: 50 ug via INTRAVENOUS
  Administered 2015-12-27: 150 ug via INTRAVENOUS
  Administered 2015-12-27: 50 ug via INTRAVENOUS

## 2015-12-27 MED ORDER — FENTANYL CITRATE (PF) 250 MCG/5ML IJ SOLN
INTRAMUSCULAR | Status: AC
  Filled 2015-12-27: qty 5

## 2015-12-27 MED ORDER — CHLORHEXIDINE GLUCONATE 4 % EX LIQD: 1.0000 "application " | Freq: Once | CUTANEOUS | Status: DC

## 2015-12-27 MED ORDER — SUGAMMADEX SODIUM 500 MG/5ML IV SOLN
INTRAVENOUS | Status: AC
  Filled 2015-12-27: qty 5

## 2015-12-27 MED ORDER — ONDANSETRON HCL 4 MG/2ML IJ SOLN
INTRAMUSCULAR | Status: DC | PRN
  Administered 2015-12-27: 4 mg via INTRAVENOUS

## 2015-12-27 MED ORDER — ONDANSETRON HCL 4 MG/2ML IJ SOLN
INTRAMUSCULAR | Status: AC
  Administered 2015-12-27: 4 mg via INTRAVENOUS
  Filled 2015-12-27: qty 2

## 2015-12-27 MED ORDER — IOHEXOL 300 MG/ML  SOLN
25.0000 mL | INTRAMUSCULAR | Status: AC
  Administered 2015-12-27: 25 mL via ORAL

## 2015-12-27 MED ORDER — SCOPOLAMINE 1 MG/3DAYS TD PT72
1.0000 | MEDICATED_PATCH | TRANSDERMAL | Status: DC
  Administered 2015-12-27: 1.5 mg via TRANSDERMAL

## 2015-12-27 MED ORDER — SUCCINYLCHOLINE CHLORIDE 20 MG/ML IJ SOLN
INTRAMUSCULAR | Status: DC | PRN
  Administered 2015-12-27: 180 mg via INTRAVENOUS

## 2015-12-27 MED ORDER — BUPIVACAINE-EPINEPHRINE (PF) 0.25% -1:200000 IJ SOLN
INTRAMUSCULAR | Status: DC | PRN
  Administered 2015-12-27: 20 mL via PERINEURAL

## 2015-12-27 MED ORDER — FENTANYL CITRATE (PF) 100 MCG/2ML IJ SOLN: 25.0000 ug | INTRAMUSCULAR | Status: DC | PRN

## 2015-12-27 MED ORDER — ONDANSETRON HCL 4 MG/2ML IJ SOLN
4.0000 mg | Freq: Four times a day (QID) | INTRAMUSCULAR | Status: DC | PRN
  Administered 2015-12-28: 4 mg via INTRAVENOUS
  Filled 2015-12-27 (×2): qty 2

## 2015-12-27 MED ORDER — SCOPOLAMINE 1 MG/3DAYS TD PT72
MEDICATED_PATCH | TRANSDERMAL | Status: AC
  Administered 2015-12-27: 1.5 mg via TRANSDERMAL
  Filled 2015-12-27: qty 1

## 2015-12-27 MED ORDER — HYDROMORPHONE HCL 1 MG/ML IJ SOLN
1.0000 mg | Freq: Once | INTRAMUSCULAR | Status: AC
  Administered 2015-12-27: 1 mg via INTRAVENOUS
  Filled 2015-12-27: qty 1

## 2015-12-27 MED ORDER — ONDANSETRON HCL 4 MG/2ML IJ SOLN
4.0000 mg | Freq: Once | INTRAMUSCULAR | Status: AC
  Administered 2015-12-27: 4 mg via INTRAVENOUS
  Filled 2015-12-27: qty 2

## 2015-12-27 MED ORDER — ONDANSETRON 4 MG PO TBDP
ORAL_TABLET | ORAL | Status: AC
Start: 2015-12-27 — End: 2015-12-27
  Administered 2015-12-27
  Filled 2015-12-27: qty 1

## 2015-12-27 MED ORDER — ACETAMINOPHEN 160 MG/5ML PO SOLN
975.0000 mg | Freq: Once | ORAL | Status: AC
Start: 2015-12-27 — End: 2015-12-27
  Administered 2015-12-27: 975 mg via ORAL
  Filled 2015-12-27: qty 40.6

## 2015-12-27 MED ORDER — MIDAZOLAM HCL 2 MG/2ML IJ SOLN
INTRAMUSCULAR | Status: AC
  Filled 2015-12-27: qty 2

## 2015-12-27 MED ORDER — SUGAMMADEX SODIUM 500 MG/5ML IV SOLN
INTRAVENOUS | Status: DC | PRN
  Administered 2015-12-27: 400 mg via INTRAVENOUS

## 2015-12-27 MED ORDER — CEFAZOLIN SODIUM-DEXTROSE 2-3 GM-% IV SOLR: 2.0000 g | INTRAVENOUS | Status: DC

## 2015-12-27 MED ORDER — LACTATED RINGERS IV SOLN
INTRAVENOUS | Status: DC
Start: 2015-12-27 — End: 2015-12-27
  Administered 2015-12-27: 09:00:00 via INTRAVENOUS

## 2015-12-27 MED ORDER — HYDROMORPHONE HCL 1 MG/ML IJ SOLN
1.0000 mg | INTRAMUSCULAR | Status: DC | PRN
  Administered 2015-12-27 – 2015-12-28 (×4): 1 mg via INTRAVENOUS
  Filled 2015-12-27 (×4): qty 1

## 2015-12-27 MED ORDER — ONDANSETRON HCL 4 MG/2ML IJ SOLN
4.0000 mg | Freq: Once | INTRAMUSCULAR | Status: AC
  Administered 2015-12-27: 4 mg via INTRAVENOUS

## 2015-12-27 MED ORDER — IOHEXOL 300 MG/ML  SOLN
100.0000 mL | Freq: Once | INTRAMUSCULAR | Status: AC | PRN
  Administered 2015-12-27: 100 mL via INTRAVENOUS

## 2015-12-27 MED ORDER — HYDROCHLOROTHIAZIDE 25 MG PO TABS
25.0000 mg | ORAL_TABLET | Freq: Every day | ORAL | Status: DC
  Administered 2015-12-27 – 2015-12-28 (×2): 25 mg via ORAL
  Filled 2015-12-27 (×2): qty 1

## 2015-12-27 MED ORDER — LISINOPRIL 20 MG PO TABS
20.0000 mg | ORAL_TABLET | Freq: Every day | ORAL | Status: DC
  Administered 2015-12-27 – 2015-12-28 (×2): 20 mg via ORAL
  Filled 2015-12-27 (×2): qty 1

## 2015-12-27 MED ORDER — PROPOFOL 10 MG/ML IV BOLUS
INTRAVENOUS | Status: AC
  Filled 2015-12-27: qty 20

## 2015-12-27 MED ORDER — ENOXAPARIN SODIUM 40 MG/0.4ML ~~LOC~~ SOLN
40.0000 mg | SUBCUTANEOUS | Status: DC
  Administered 2015-12-28: 40 mg via SUBCUTANEOUS
  Filled 2015-12-27: qty 0.4

## 2015-12-27 MED ORDER — LIDOCAINE HCL (CARDIAC) 20 MG/ML IV SOLN
INTRAVENOUS | Status: DC | PRN
  Administered 2015-12-27: 100 mg via INTRAVENOUS

## 2015-12-27 MED ORDER — BUPIVACAINE-EPINEPHRINE (PF) 0.25% -1:200000 IJ SOLN
INTRAMUSCULAR | Status: AC
  Filled 2015-12-27: qty 30

## 2015-12-27 SURGICAL SUPPLY — 35 items
CHLORAPREP W/TINT 26ML (MISCELLANEOUS) ×3 IMPLANT
COVER SURGICAL LIGHT HANDLE (MISCELLANEOUS) ×3 IMPLANT
DRAPE LAPAROTOMY T 98X78 PEDS (DRAPES) ×3 IMPLANT
DRAPE UTILITY XL STRL (DRAPES) ×3 IMPLANT
ELECT CAUTERY BLADE 6.4 (BLADE) ×3 IMPLANT
ELECT REM PT RETURN 9FT ADLT (ELECTROSURGICAL) ×3
ELECTRODE REM PT RTRN 9FT ADLT (ELECTROSURGICAL) ×2 IMPLANT
GLOVE BIO SURGEON STRL SZ7 (GLOVE) ×3 IMPLANT
GLOVE BIOGEL PI IND STRL 7.0 (GLOVE) ×2 IMPLANT
GLOVE BIOGEL PI IND STRL 7.5 (GLOVE) ×4 IMPLANT
GLOVE BIOGEL PI INDICATOR 7.0 (GLOVE) ×1
GLOVE BIOGEL PI INDICATOR 7.5 (GLOVE) ×2
GLOVE SURG SIGNA 7.5 PF LTX (GLOVE) ×3 IMPLANT
GLOVE SURG SS PI 7.5 STRL IVOR (GLOVE) ×3 IMPLANT
GOWN STRL REUS W/ TWL LRG LVL3 (GOWN DISPOSABLE) ×4 IMPLANT
GOWN STRL REUS W/ TWL XL LVL3 (GOWN DISPOSABLE) ×4 IMPLANT
GOWN STRL REUS W/TWL LRG LVL3 (GOWN DISPOSABLE) ×2
GOWN STRL REUS W/TWL XL LVL3 (GOWN DISPOSABLE) ×2
KIT BASIN OR (CUSTOM PROCEDURE TRAY) ×3 IMPLANT
KIT ROOM TURNOVER OR (KITS) ×3 IMPLANT
LIQUID BAND (GAUZE/BANDAGES/DRESSINGS) ×3 IMPLANT
NEEDLE HYPO 25GX1X1/2 BEV (NEEDLE) ×3 IMPLANT
NS IRRIG 1000ML POUR BTL (IV SOLUTION) ×3 IMPLANT
PACK SURGICAL SETUP 50X90 (CUSTOM PROCEDURE TRAY) ×3 IMPLANT
PAD ARMBOARD 7.5X6 YLW CONV (MISCELLANEOUS) ×3 IMPLANT
PENCIL BUTTON HOLSTER BLD 10FT (ELECTRODE) ×3 IMPLANT
SPONGE LAP 18X18 X RAY DECT (DISPOSABLE) ×3 IMPLANT
SUT MON AB 4-0 PC3 18 (SUTURE) ×3 IMPLANT
SUT NOVA NAB DX-16 0-1 5-0 T12 (SUTURE) ×6 IMPLANT
SUT VIC AB 2-0 CT1 27 (SUTURE) ×2
SUT VIC AB 2-0 CT1 TAPERPNT 27 (SUTURE) ×4 IMPLANT
SUT VIC AB 3-0 CT1 27 (SUTURE) ×1
SUT VIC AB 3-0 CT1 TAPERPNT 27 (SUTURE) ×2 IMPLANT
SYR CONTROL 10ML LL (SYRINGE) ×3 IMPLANT
TOWEL OR 17X26 10 PK STRL BLUE (TOWEL DISPOSABLE) ×3 IMPLANT

## 2015-12-27 NOTE — H&P (Signed)
Alexandra Powers is an 45 y.o. female.   Chief Complaint: Postop nausea, vomiting, abdominal pain HPI: She is status post a lap cholecystectomy on 1/21.  She was doing well until yesterday when she developed epigastric abdominal pain with nausea and vomiting. She has since had a CAT scan of the abdomen and pelvis showing an incarcerated hernia at the umbilicus which contains only fat and no bowel. The CAT scan is otherwise unremarkable. She is still having abdominal pain with nausea and vomiting since being in the emergency department. She is otherwise without complaints  Past Medical History  Diagnosis Date  . Hypertension   . Obesity     Past Surgical History  Procedure Laterality Date  . Cholecystectomy N/A 12/21/2015    Procedure: LAPAROSCOPIC CHOLECYSTECTOMY;  Surgeon: Georganna Skeans, MD;  Location: [facility] OR;  Service: General;  Laterality: N/A;    Family History  Problem Relation Age of Onset  . Arthritis Mother   . Adrenal disorder Neg Hx    Social History:  reports that she has been smoking Cigarettes.  She has a 5 pack-year smoking history. She does not have any smokeless tobacco history on file. She reports that she drinks alcohol. She reports that she uses illicit drugs (Marijuana).  Allergies: No Known Allergies   (Not in a hospital admission)  Results for orders placed or performed during the hospital encounter of 12/27/15 (from the past 48 hour(s))  Lipase, blood     Status: None   Collection Time: 12/26/15 11:51 PM  Result Value Ref Range   Lipase 39 11 - 51 U/L  Comprehensive metabolic panel     Status: Abnormal   Collection Time: 12/26/15 11:51 PM  Result Value Ref Range   Sodium 139 135 - 145 mmol/L   Potassium 3.3 (L) 3.5 - 5.1 mmol/L   Chloride 102 101 - 111 mmol/L   CO2 25 22 - 32 mmol/L   Glucose, Bld 115 (H) 65 - 99 mg/dL   BUN 9 6 - 20 mg/dL   Creatinine, Ser 1.10 (H) 0.44 - 1.00 mg/dL   Calcium 9.2 8.9 - 10.3 mg/dL   Total Protein 7.8 6.5 - 8.1 g/dL   Albumin 3.2 (L) 3.5 - 5.0 g/dL   AST 176 (H) 15 - 41 U/L   ALT 54 14 - 54 U/L   Alkaline Phosphatase 75 38 - 126 U/L   Total Bilirubin 0.5 0.3 - 1.2 mg/dL   GFR calc non Af Amer 60 (L) >60 mL/min   GFR calc Af Amer >60 >60 mL/min    Comment: (NOTE) The eGFR has been calculated using the CKD EPI equation. This calculation has not been validated in all clinical situations. eGFR's persistently <60 mL/min signify possible Chronic Kidney Disease.    Anion gap 12 5 - 15  CBC     Status: Abnormal   Collection Time: 12/26/15 11:51 PM  Result Value Ref Range   WBC 12.2 (H) 4.0 - 10.5 K/uL   RBC 3.91 3.87 - 5.11 MIL/uL   Hemoglobin 12.5 12.0 - 15.0 g/dL   HCT 37.3 36.0 - 46.0 %   MCV 95.4 78.0 - 100.0 fL   MCH 32.0 26.0 - 34.0 pg   MCHC 33.5 30.0 - 36.0 g/dL   RDW 13.7 11.5 - 15.5 %   Platelets 343 150 - 400 K/uL  Urinalysis, Routine w reflex microscopic (not at [facility])     Status: Abnormal   Collection Time: 12/27/15  1:34 AM  Result Value Ref  Range   Color, Urine AMBER (A) YELLOW    Comment: BIOCHEMICALS MAY BE AFFECTED BY COLOR   APPearance CLOUDY (A) CLEAR   Specific Gravity, Urine 1.025 1.005 - 1.030   pH 6.0 5.0 - 8.0   Glucose, UA NEGATIVE NEGATIVE mg/dL   Hgb urine dipstick NEGATIVE NEGATIVE   Bilirubin Urine NEGATIVE NEGATIVE   Ketones, ur NEGATIVE NEGATIVE mg/dL   Protein, ur NEGATIVE NEGATIVE mg/dL   Nitrite NEGATIVE NEGATIVE   Leukocytes, UA NEGATIVE NEGATIVE    Comment: MICROSCOPIC NOT DONE ON URINES WITH NEGATIVE PROTEIN, BLOOD, LEUKOCYTES, NITRITE, OR GLUCOSE <1000 mg/dL.  Pregnancy, urine     Status: None   Collection Time: 12/27/15  3:36 AM  Result Value Ref Range   Preg Test, Ur NEGATIVE NEGATIVE    Comment:        THE SENSITIVITY OF THIS METHODOLOGY IS >20 mIU/mL.    Ct Abdomen Pelvis W Contrast  12/27/2015  CLINICAL DATA:  Sudden onset of periumbilical pain, nausea vomiting and chills. Patient is status post cholecystectomy approximately 1 week ago. EXAM:  CT ABDOMEN AND PELVIS WITH CONTRAST TECHNIQUE: Multidetector CT imaging of the abdomen and pelvis was performed using the standard protocol following bolus administration of intravenous contrast. CONTRAST:  138m OMNIPAQUE IOHEXOL 300 MG/ML  SOLN COMPARISON:  CT of the abdomen 10/11/2015 FINDINGS: Lower chest:  Mild bibasilar atelectasis. Hepatobiliary: No masses or other significant abnormality. Postcholecystectomy. No evidence of fluid collection or inflammatory changes within the surgical bed. Pancreas: No mass, inflammatory changes, or other significant abnormality. Spleen: Within normal limits in size and appearance. Adrenals/Urinary Tract: No masses identified. No evidence of hydronephrosis. 6 mm left anterior partially exophytic renal nodule, too small to be accurately characterize, but likely representing a cyst. Thickening of the lateral limb of the left adrenal gland measuring 7 mm unchanged. Stomach/Bowel: No evidence of obstruction, inflammatory process, or abnormal fluid collections. Vascular/Lymphatic: No pathologically enlarged lymph nodes. No evidence of abdominal aortic aneurysm. Reproductive: No mass or other significant abnormality. Other: Fat containing supraumbilical anterior abdominal wall hernia, with inflammatory fat stranding and soft tissue thickening along its left border with the rectus abdominus, image 50 sequence 2. Subcutaneous cyst within the left anterior chest wall, unchanged. Musculoskeletal: No suspicious bone lesions identified. Moderate in severity articular facet arthropathy in the lower lumbosacral spine. IMPRESSION: Narrow neck supraumbilical fat containing midline anterior abdominal wall hernia, with evidence of inflammatory stranding and soft tissue thickening along its border with the left rectus abdominus, query inflammatory changes at laparoscopic insertion site. No evidence of intra-abdominal abscess. Normal appearance of cholecystectomy surgical site. 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 6 mm left renal partially exophytic hypoattenuated nodule, too small to be actually characterize, but likely representing a cyst. Electronically Signed   By: DFidela SalisburyM.D.   On: 12/27/2015 07:17    Review of Systems  Constitutional: Negative for fever.  Respiratory: Negative for cough and shortness of breath.   Cardiovascular: Negative for chest pain.  All other systems reviewed and are negative.   Blood pressure 132/83, pulse 78, temperature 97.5 F (36.4 C), resp. rate 20, last menstrual period 10/31/2015, SpO2 98 %. Physical Exam  Constitutional: She is oriented to person, place, and time. She appears distressed.  Morbidly obese  HENT:  Head: Normocephalic and atraumatic.  Eyes: Conjunctivae are normal. Pupils are equal, round, and reactive to light. Left eye exhibits no discharge. No scleral icterus.  Neck: Normal range of motion. No  tracheal deviation present.  Cardiovascular: Regular rhythm and normal heart sounds.   Tachycardic  Respiratory: Effort normal and breath sounds normal. No respiratory distress. She has no wheezes.  GI: Soft. She exhibits no distension. There is tenderness. There is guarding.  Obese  There is a tender, incarcerated hernia at the incision above the umbilicus  Musculoskeletal: Normal range of motion. She exhibits no edema.  Neurological: She is alert and oriented to person, place, and time.  Skin: Skin is warm and dry. No erythema.  Psychiatric: Her behavior is normal. Judgment normal.     Assessment/Plan Incarcerated incisional hernia at the umbilicus  The CAT scan is otherwise unremarkable. There does not seem to be bowel involved but because of her tenderness with nausea and vomiting, I believe the hernia needs urgent repair. I discussed this with her in detail. I discussed the risk of surgery which includes but is not limited to bleeding, infection, injury to surrounding structures,  cardiopulmonary issues, need for further surgery, recurrent hernias, etc. She understands and wishes to proceed  Ruthella Kirchman A 12/27/2015, 7:52 AM

## 2015-12-27 NOTE — ED Notes (Signed)
Pt transported to Short stay via EMT, all pt belongings with pt at bedside.  No family present.  A x 4, NAD.

## 2015-12-27 NOTE — Anesthesia Procedure Notes (Signed)
Procedure Name: Intubation Date/Time: 12/27/2015 10:27 AM Performed by: Tressia Miners LEFFEW Pre-anesthesia Checklist: Patient identified, Patient being monitored, Timeout performed, Emergency Drugs available and Suction available Patient Re-evaluated:Patient Re-evaluated prior to inductionOxygen Delivery Method: Circle System Utilized Preoxygenation: Pre-oxygenation with 100% oxygen Intubation Type: IV induction Laryngoscope Size: Mac and 3 Grade View: Grade I Tube type: Oral Tube size: 7.5 mm Number of attempts: 1 Airway Equipment and Method: Stylet Placement Confirmation: ETT inserted through vocal cords under direct vision,  positive ETCO2 and breath sounds checked- equal and bilateral Secured at: 23 cm Tube secured with: Tape Dental Injury: Teeth and Oropharynx as per pre-operative assessment  Comments: RSI with cricoid pressure

## 2015-12-27 NOTE — ED Notes (Signed)
Lab called to add on pregnancy test to urine sample provided

## 2015-12-27 NOTE — ED Notes (Signed)
MD at bedside. 

## 2015-12-27 NOTE — Op Note (Signed)
INCARCERATED UMBILICAL HERNIA REPAIR  Procedure Note  Alexandra Powers 12/27/2015   Pre-op Diagnosis: INCARCERATED UMBILICAL HERNIA     Post-op Diagnosis: same  Procedure(s): INCARCERATED UMBILICAL HERNIA REPAIR  Surgeon(s): Coralie Keens, MD  Anesthesia: General  Staff:  Circulator: Harrel Lemon, RN Scrub Person: Rolan Bucco Circulator Assistant: Cyd Silence, RN RN First Assistant: Cyd Silence, RN  Estimated Blood Loss: Evelena Asa A   Date: 12/27/2015  Time: 10:57 AM

## 2015-12-27 NOTE — ED Notes (Signed)
Patient transported to CT 

## 2015-12-27 NOTE — Anesthesia Preprocedure Evaluation (Addendum)
Anesthesia Evaluation  Patient identified by MRN, date of birth, ID band Patient awake    Reviewed: Allergy & Precautions, H&P , NPO status , Patient's Chart, lab work & pertinent test results, reviewed documented beta blocker date and time   History of Anesthesia Complications Negative for: history of anesthetic complications  Airway Mallampati: III  TM Distance: >3 FB Neck ROM: Full    Dental no notable dental hx. (+) Teeth Intact, Dental Advisory Given   Pulmonary Current Smoker,    Pulmonary exam normal + rhonchi        Cardiovascular hypertension, Pt. on medications  Rhythm:Regular Rate:Normal     Neuro/Psych negative neurological ROS     GI/Hepatic Neg liver ROS,   Endo/Other  negative endocrine ROSMorbid obesity  Renal/GU negative Renal ROS     Musculoskeletal   Abdominal (+) + obese,   Peds  Hematology   Anesthesia Other Findings N/v.  cholelithiasis  Reproductive/Obstetrics                             Anesthesia Physical  Anesthesia Plan  ASA: III  Anesthesia Plan: General   Post-op Pain Management:    Induction: Intravenous  Airway Management Planned: Oral ETT  Additional Equipment:   Intra-op Plan:   Post-operative Plan: Extubation in OR  Informed Consent: I have reviewed the patients History and Physical, chart, labs and discussed the procedure including the risks, benefits and alternatives for the proposed anesthesia with the patient or authorized representative who has indicated his/her understanding and acceptance.   Dental advisory given  Plan Discussed with: CRNA and Surgeon  Anesthesia Plan Comments:         Anesthesia Quick Evaluation

## 2015-12-27 NOTE — Progress Notes (Signed)
Chg clothes complete.

## 2015-12-27 NOTE — Op Note (Signed)
[name], Alexandra Powers                  ACCOUNT NO.:  [id number]  MEDICAL RECORD NO.:  [id number]  LOCATION:  [facility]                         FACILITY:  [facility]  PHYSICIAN:  Coralie Keens, M.D. DATE OF BIRTH:  09-17-1971  DATE OF PROCEDURE:  12/27/2015 DATE OF DISCHARGE:                              OPERATIVE REPORT   PREOPERATIVE DIAGNOSIS:  Incarcerated umbilical hernia.  POSTOPERATIVE DIAGNOSIS:  Incarcerated umbilical hernia.  PROCEDURE:  Repair of incarcerated umbilical hernia.  SURGEON:  Coralie Keens, M.D.  ANESTHESIA:  General with 0.25% Marcaine.  ESTIMATED BLOOD LOSS:  Minimal.  INDICATIONS:  A 45 year old female who is at 6 days status post a laparoscopic cholecystectomy.  She developed abdominal pain with nausea and vomiting yesterday.  She was presented to the emergency department. She was found on a CAT scan to have an incarcerated hernia containing omentum at the umbilicus.  There were no other intraabdominal abnormalities.  There was no fluid in the gallbladder fossa and no obstruction of the bowel.  Decision was made to proceed to the operating room for hernia repair.  FINDINGS:  The suture in the fascia appeared to be intact.  There was a separate hernia that was just to the right of this containing an incarcerated omentum and a hernia sac.  No bowel was involved with the hernia.  PROCEDURE IN DETAIL:  The patient was brought to the operating room, identified as Alexandra Powers.  She was placed supine on the operating room table, and general anesthesia was induced.  Her abdomen was then prepped and draped in the usual sterile fashion.  I made an incision through the patient's previous scar with a scalpel.  I took this down into the subcutaneous tissue.  There was inflamed appearing fluid around the incarcerated hernia.  At this point, I could then visualize the previously placed Vicryl suture which appeared intact.  I removed this opening up the fascial defect.   The actual hernia itself was coming through what appeared to be a separate hernia just to the right of the fascial opening.  It had contained an incarcerated omentum and a hernia sac.  I excised the sac and the omentum and reduced the rest of the omentum in the abdominal cavity.  There was no intraabdominal fluid and nothing else that suggests an intraabdominal process.  At this point, I connected these fascial defects from the hernia with the other facial opening.  I then closed the large facial opening with interrupted and figure-of-eight #1 Novafil sutures.  I then anesthetized the fascia with Marcaine.  I then irrigated the wound with saline.  I anesthetized it with Marcaine.  I closed the subcutaneous tissue with 3-0 Vicryl sutures and closed the skin with a running 4-0 Monocryl.  Skin glue was then applied.  The patient tolerated the procedure well.  All the counts were correct at the end of the procedure.  The patient was then extubated in the operating room and taken in stable condition to the recovery room.     Coralie Keens, M.D.     DB/MEDQ  D:  12/27/2015  T:  12/27/2015  Job:  [id number]

## 2015-12-27 NOTE — Transfer of Care (Signed)
Immediate Anesthesia Transfer of Care Note  Patient: Alexandra Powers  Procedure(s) Performed: Procedure(s): INCARCERATED UMBILICAL HERNIA REPAIR (N/A)  Patient Location: PACU  Anesthesia Type:General  Level of Consciousness: awake, alert , oriented, patient cooperative and responds to stimulation  Airway & Oxygen Therapy: Patient Spontanous Breathing and Patient connected to nasal cannula oxygen  Post-op Assessment: Report given to RN, Post -op Vital signs reviewed and stable and Patient moving all extremities X 4  Post vital signs: Reviewed and stable  Last Vitals:  Filed Vitals:   12/27/15 0830 12/27/15 0845  BP: 114/74 127/74  Pulse: 80 86  Temp:    Resp:      Complications: No apparent anesthesia complications

## 2015-12-27 NOTE — ED Notes (Signed)
RN inquiring about CT scan delay; CT informed RN that patient is a "2 hr PO contrast drinker" and that scan will occur around 06:20am

## 2015-12-27 NOTE — Anesthesia Postprocedure Evaluation (Signed)
Anesthesia Post Note  Patient: Alexandra Powers  Procedure(s) Performed: Procedure(s) (LRB): INCARCERATED UMBILICAL HERNIA REPAIR (N/A)  Patient location during evaluation: PACU Anesthesia Type: General Level of consciousness: sedated Pain management: satisfactory to patient Vital Signs Assessment: post-procedure vital signs reviewed and stable Respiratory status: spontaneous breathing Cardiovascular status: stable Anesthetic complications: no    Last Vitals:  Filed Vitals:   12/27/15 1120 12/27/15 1235  BP:    Pulse:    Temp: 36.4 C 36.6 C  Resp:      Last Pain:  Filed Vitals:   12/27/15 1241  PainSc: Asleep                 Ahsley Attwood EDWARD

## 2015-12-27 NOTE — ED Provider Notes (Signed)
CSN: [id number]     Arrival date & time 12/26/15  2333 History    By signing my name below, I, [name], attest that this documentation has been prepared under the direction and in the presence of Ripley Fraise, MD.  Electronically Signed: [name], ED Scribe. 12/27/2015. 2:26 AM.   Chief Complaint  Patient presents with  . Post-op Problem  . Abdominal Pain   Patient is a 45 y.o. female presenting with abdominal pain. The history is provided by the patient. No language interpreter was used.  Abdominal Pain Pain location:  Generalized Pain radiates to:  Does not radiate Pain severity:  Moderate Onset quality:  Gradual Duration:  1 day Timing:  Constant Progression:  Worsening Chronicity:  New Relieved by:  Nothing Worsened by:  Nothing tried Associated symptoms: nausea   Associated symptoms: no chest pain, no chills, no diarrhea, no fever, no shortness of breath and no vomiting     HPI Comments: Alexandra Powers is a 45 y.o. female with a PMHx of HTN who presents to the Emergency Department complaining of constant, ongoing, worsening abdominal pain. Pain is described as sharp. No aggravating or alleviating factors at this time. Pt also reports ongoing nausea and dizziness. Prescribed pain medication attempted at home without any improvement for symptoms. No recent fever, chills, shortness of breath, chest pain,PSHx includes cholecystectomy 12/21/15 performed by Dr. Georganna Skeans.  PCP: Binnie Rail, MD    Past Medical History  Diagnosis Date  . Hypertension   . Obesity    Past Surgical History  Procedure Laterality Date  . Cholecystectomy N/A 12/21/2015    Procedure: LAPAROSCOPIC CHOLECYSTECTOMY;  Surgeon: Georganna Skeans, MD;  Location: [facility] OR;  Service: General;  Laterality: N/A;   Family History  Problem Relation Age of Onset  . Arthritis Mother   . Adrenal disorder Neg Hx    Social History  Substance Use Topics  . Smoking status: Current Every Day Smoker -- 0.50  packs/day for 10 years    Types: Cigarettes  . Smokeless tobacco: None     Comment: plans on quitting new years  . Alcohol Use: 0.0 oz/week    0 Standard drinks or equivalent per week     Comment: sometimes   OB History    No data available     Review of Systems  Constitutional: Negative for fever and chills.  Respiratory: Negative for shortness of breath.   Cardiovascular: Negative for chest pain.  Gastrointestinal: Positive for nausea and abdominal pain. Negative for vomiting and diarrhea.  Neurological: Positive for dizziness. Negative for headaches.  Psychiatric/Behavioral: Negative for confusion.  All other systems reviewed and are negative.     Allergies  Review of patient's allergies indicates no known allergies.  Home Medications   Prior to Admission medications   Medication Sig Start Date End Date Taking? Authorizing Provider  acetaminophen (TYLENOL) 325 MG tablet You can take plain tylenol (acetaminophen) or you can take the pain pill prescribed which also has Tylenol in it.  You cannot take more than 4000 mg of Tylenol per day so you have to count what is in your prescription pill also. 12/22/15   Earnstine Regal, PA-C  amLODipine (NORVASC) 10 MG tablet Take 1 tablet (10 mg total) by mouth daily. 09/16/15   Micheline Chapman, NP  HYDROcodone-acetaminophen (NORCO/VICODIN) 5-325 MG tablet Take 1-2 tablets by mouth every 6 (six) hours as needed for moderate pain. 12/22/15   Earnstine Regal, PA-C  lisinopril-hydrochlorothiazide (PRINZIDE,ZESTORETIC) 20-25 MG tablet  Take 1 tablet by mouth daily. 08/29/15   Micheline Chapman, NP   Triage Vitals: BP 116/91 mmHg  Pulse 91  Temp(Src) 97.5 F (36.4 C)  Resp 28  SpO2 96%  LMP 10/31/2015   Physical Exam  CONSTITUTIONAL: Well developed/well nourished HEAD: Normocephalic/atraumatic EYES: EOMI/PERRL ENMT: Mucous membranes moist NECK: supple no meningeal signs SPINE/BACK:entire spine nontender CV: S1/S2 noted, no  murmurs/rubs/gallops noted LUNGS: Lungs are clear to auscultation bilaterally, no apparent distress ABDOMEN: soft, moderate epigastric tenderness, no rebound or guarding, bowel sounds noted throughout abdomen. Well healing incisions noted GU:no cva tenderness NEURO: Pt is awake/alert/appropriate, moves all extremitiesx4.  No facial droop.   EXTREMITIES: pulses normal/equal, full ROM SKIN: warm, color normal PSYCH: no abnormalities of mood noted, alert and oriented to situation   ED Course  Procedures   DIAGNOSTIC STUDIES: Oxygen Saturation is 96% on RA, adequate by my interpretation.    COORDINATION OF CARE: 1:53 AM- Will give Dilaudid and Zofran. Will order CT abdomen pelvis with contrast, Lipase, CMP, CBC, and urinalysis. Discussed treatment plan with pt at bedside and pt agreed to plan.   5:02 AM Advised CT imaging as recent cholecystectomy with significant epigastric ABD tenderness Delay in CT imaging due to not starting oral contrast Pt resting comfortably awaiting imaging at this time 7:34 AM CT imaging reveals possible fat containing hernia with inflammatory changes Pt still with significant abd pain and now vomiting D/w dr Ninfa Linden, will have surgical PA evaluate patient   Labs Review Labs Reviewed  COMPREHENSIVE METABOLIC PANEL - Abnormal; Notable for the following:    Potassium 3.3 (*)    Glucose, Bld 115 (*)    Creatinine, Ser 1.10 (*)    Albumin 3.2 (*)    AST 176 (*)    GFR calc non Af Amer 60 (*)    All other components within normal limits  CBC - Abnormal; Notable for the following:    WBC 12.2 (*)    All other components within normal limits  URINALYSIS, ROUTINE W REFLEX MICROSCOPIC (NOT AT [facility]) - Abnormal; Notable for the following:    Color, Urine AMBER (*)    APPearance CLOUDY (*)    All other components within normal limits  LIPASE, BLOOD  PREGNANCY, URINE    Imaging Review Ct Abdomen Pelvis W Contrast  12/27/2015  CLINICAL DATA:  Sudden onset of  periumbilical pain, nausea vomiting and chills. Patient is status post cholecystectomy approximately 1 week ago. EXAM: CT ABDOMEN AND PELVIS WITH CONTRAST TECHNIQUE: Multidetector CT imaging of the abdomen and pelvis was performed using the standard protocol following bolus administration of intravenous contrast. CONTRAST:  126mL OMNIPAQUE IOHEXOL 300 MG/ML  SOLN COMPARISON:  CT of the abdomen 10/11/2015 FINDINGS: Lower chest:  Mild bibasilar atelectasis. Hepatobiliary: No masses or other significant abnormality. Postcholecystectomy. No evidence of fluid collection or inflammatory changes within the surgical bed. Pancreas: No mass, inflammatory changes, or other significant abnormality. Spleen: Within normal limits in size and appearance. Adrenals/Urinary Tract: No masses identified. No evidence of hydronephrosis. 6 mm left anterior partially exophytic renal nodule, too small to be accurately characterize, but likely representing a cyst. Thickening of the lateral limb of the left adrenal gland measuring 7 mm unchanged. Stomach/Bowel: No evidence of obstruction, inflammatory process, or abnormal fluid collections. Vascular/Lymphatic: No pathologically enlarged lymph nodes. No evidence of abdominal aortic aneurysm. Reproductive: No mass or other significant abnormality. Other: Fat containing supraumbilical anterior abdominal wall hernia, with inflammatory fat stranding and soft tissue thickening along its  left border with the rectus abdominus, image 50 sequence 2. Subcutaneous cyst within the left anterior chest wall, unchanged. Musculoskeletal: No suspicious bone lesions identified. Moderate in severity articular facet arthropathy in the lower lumbosacral spine. IMPRESSION: Narrow neck supraumbilical fat containing midline anterior abdominal wall hernia, with evidence of inflammatory stranding and soft tissue thickening along its border with the left rectus abdominus, query inflammatory changes at laparoscopic  insertion site. No evidence of intra-abdominal abscess. Normal appearance of cholecystectomy surgical site. 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 6 mm left renal partially exophytic hypoattenuated nodule, too small to be actually characterize, but likely representing a cyst. Electronically Signed   By: Fidela Salisbury M.D.   On: 12/27/2015 07:17   I have personally reviewed and evaluated these  lab results as part of my medical decision-making.   Medications  iohexol (OMNIPAQUE) 300 MG/ML solution 25 mL (25 mLs Oral Contrast Given 12/27/15 0421)  HYDROmorphone (DILAUDID) injection 1 mg (not administered)  ondansetron (ZOFRAN) injection 4 mg (not administered)  ondansetron (ZOFRAN-ODT) 4 MG disintegrating tablet (  Given 12/27/15 0024)  HYDROmorphone (DILAUDID) injection 1 mg (1 mg Intravenous Given 12/27/15 0228)  ondansetron (ZOFRAN) injection 4 mg (4 mg Intravenous Given 12/27/15 0228)  iohexol (OMNIPAQUE) 300 MG/ML solution 100 mL (100 mLs Intravenous Contrast Given 12/27/15 [id number])    MDM   Final diagnoses:  Post-operative pain    Nursing notes including past medical history and social history reviewed and considered in documentation Labs/vital reviewed myself and considered during evaluation   I personally performed the services described in this documentation, which was scribed in my presence. The recorded information has been reviewed and is accurate.       Ripley Fraise, MD 12/27/15 [phone]

## 2015-12-28 MED ORDER — HYDROCODONE-ACETAMINOPHEN 5-325 MG PO TABS: 1.0000 | ORAL_TABLET | Freq: Four times a day (QID) | ORAL | Status: DC | PRN

## 2015-12-28 NOTE — Discharge Instructions (Signed)
[facility] _______[facility]  UMBILICAL OR INGUINAL HERNIA REPAIR: POST OP INSTRUCTIONS  Always review your discharge instruction sheet given to you by the facility where your surgery was performed. IF YOU HAVE DISABILITY OR FAMILY LEAVE FORMS, YOU MUST BRING THEM TO THE OFFICE FOR PROCESSING.   DO NOT GIVE THEM TO YOUR DOCTOR.  1. A  prescription for pain medication may be given to you upon discharge.  Take your pain medication as prescribed, if needed.  If narcotic pain medicine is not needed, then you may take acetaminophen (Tylenol) or ibuprofen (Advil) as needed. 2. Take your usually prescribed medications unless otherwise directed. 3. If you need a refill on your pain medication, please contact your pharmacy.  They will contact our office to request authorization. Prescriptions will not be filled after 5 pm or on week-ends. 4. You should follow a light diet the first 24 hours after arrival home, such as soup and crackers, etc.  Be sure to include lots of fluids daily.  Resume your normal diet the day after surgery. 5. Most patients will experience some swelling and bruising around the umbilicus or in the groin and scrotum.  Ice packs and reclining will help.  Swelling and bruising can take several days to resolve.  6. It is common to experience some constipation if taking pain medication after surgery.  Increasing fluid intake and taking a stool softener (such as Colace) will usually help or prevent this problem from occurring.  A mild laxative (Milk of Magnesia or Miralax) should be taken according to package directions if there are no bowel movements after 48 hours. 7. Unless discharge instructions indicate otherwise, you may remove your bandages 24-48 hours after surgery, and you may shower at that time.  You may have steri-strips (small skin tapes) in place directly over the incision.  These strips should be left on the skin for 7-10 days.  If your surgeon used skin glue on the  incision, you may shower in 24 hours.  The glue will flake off over the next 2-3 weeks.  Any sutures or staples will be removed at the office during your follow-up visit. 8. ACTIVITIES:  You may resume regular (light) daily activities beginning the next day--such as daily self-care, walking, climbing stairs--gradually increasing activities as tolerated.  You may have sexual intercourse when it is comfortable.  Refrain from any heavy lifting or straining until approved by your doctor. a. You may drive when you are no longer taking prescription pain medication, you can comfortably wear a seatbelt, and you can safely maneuver your car and apply brakes. b. RETURN TO WORK:  __________________________________________________________ 9. You should see your doctor in the office for a follow-up appointment approximately 2-3 weeks after your surgery.  Make sure that you call for this appointment within a day or two after you arrive home to insure a convenient appointment time. 10. OTHER INSTRUCTIONS:  __________________________________________________________________________________________________________________________________________________________________________________________  WHEN TO CALL YOUR DOCTOR: 1. Fever over 101.0 2. Inability to urinate 3. Nausea and/or vomiting 4. Extreme swelling or bruising 5. Continued bleeding from incision. 6. Increased pain, redness, or drainage from the incision  The clinic staff is available to answer your questions during regular business hours.  Please don't hesitate to call and ask to speak to one of the nurses for clinical concerns.  If you have a medical emergency, go to the nearest emergency room or call 911.  A surgeon from [facility] is always on call at the hospital     [address], [address], [place], [place]  [address] ?  [address] [address], [place], [place]   [address] [phone] ? [phone] ? FAX [phone] Web site:  [email or web address]  

## 2015-12-28 NOTE — Discharge Summary (Signed)
Physician Discharge Summary  [name] MRN: [id number] DOB/AGE: 07-08-1971 45 y.o.  Admit date: 12/27/2015 Discharge date: 12/28/2015  Admitting Diagnosis: Incarcerated incisional hernia at the umbilicus  Discharge Diagnosis Patient Active Problem List   Diagnosis Date Noted  . Incarcerated hernia 12/27/2015  . Chronic cholecystitis with calculus 12/20/2015  . HTN (hypertension), benign 11/11/2015  . Morbid obesity ([place]) 11/11/2015  . Gallstones 11/11/2015  . Adrenal mass ([place]) 08/30/2015  . Ovarian cyst 08/30/2015    Consultants none  Imaging: Ct Abdomen Pelvis W Contrast  12/27/2015  CLINICAL DATA:  Sudden onset of periumbilical pain, nausea vomiting and chills. Patient is status post cholecystectomy approximately 1 week ago. EXAM: CT ABDOMEN AND PELVIS WITH CONTRAST TECHNIQUE: Multidetector CT imaging of the abdomen and pelvis was performed using the standard protocol following bolus administration of intravenous contrast. CONTRAST:  148mL OMNIPAQUE IOHEXOL 300 MG/ML  SOLN COMPARISON:  CT of the abdomen 10/11/2015 FINDINGS: Lower chest:  Mild bibasilar atelectasis. Hepatobiliary: No masses or other significant abnormality. Postcholecystectomy. No evidence of fluid collection or inflammatory changes within the surgical bed. Pancreas: No mass, inflammatory changes, or other significant abnormality. Spleen: Within normal limits in size and appearance. Adrenals/Urinary Tract: No masses identified. No evidence of hydronephrosis. 6 mm left anterior partially exophytic renal nodule, too small to be accurately characterize, but likely representing a cyst. Thickening of the lateral limb of the left adrenal gland measuring 7 mm unchanged. Stomach/Bowel: No evidence of obstruction, inflammatory process, or abnormal fluid collections. Vascular/Lymphatic: No pathologically enlarged lymph nodes. No evidence of abdominal aortic aneurysm. Reproductive: No mass or other significant  abnormality. Other: Fat containing supraumbilical anterior abdominal wall hernia, with inflammatory fat stranding and soft tissue thickening along its left border with the rectus abdominus, image 50 sequence 2. Subcutaneous cyst within the left anterior chest wall, unchanged. Musculoskeletal: No suspicious bone lesions identified. Moderate in severity articular facet arthropathy in the lower lumbosacral spine. IMPRESSION: Narrow neck supraumbilical fat containing midline anterior abdominal wall hernia, with evidence of inflammatory stranding and soft tissue thickening along its border with the left rectus abdominus, query inflammatory changes at laparoscopic insertion site. No evidence of intra-abdominal abscess. Normal appearance of cholecystectomy surgical site. 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 6 mm left renal partially exophytic hypoattenuated nodule, too small to be actually characterize, but likely representing a cyst. Electronically Signed   By: Fidela Salisbury M.D.   On: 12/27/2015 07:17    Procedures Repair of incarcerated umbilical hernia---Dr. [facility] Course:  Moonyeen Langell had a cholecystectomy on 12/21/15 and presented to [facility] with nausea, vomiting and abdominal pain.  CT was unremarkable, but pt had a incisional hernia.  Patient was admitted and underwent procedure listed above.  Tolerated procedure well and was transferred to the floor.  Diet was advanced as tolerated.  On POD#1, the patient was voiding well, tolerating diet, ambulating well, pain well controlled, vital signs stable, incisions c/d/i and felt stable for discharge home.  Medication risks, benefits and therapeutic alternatives were reviewed with the patient.  She verbalizes understanding.  Patient will follow up in our office in 2 weeks and knows to call with questions or concerns.  Physical Exam: General:  Alert, NAD, pleasant, comfortable Abd:  Soft, ND, mild tenderness,  incisions C/D/I    Medication List    TAKE these medications        acetaminophen 325 MG tablet  Commonly known as:  TYLENOL  You can take plain  tylenol (acetaminophen) or you can take the pain pill prescribed which also has Tylenol in it.  You cannot take more than 4000 mg of Tylenol per day so you have to count what is in your prescription pill also.     amLODipine 10 MG tablet  Commonly known as:  NORVASC  Take 1 tablet (10 mg total) by mouth daily.     docusate sodium 100 MG capsule  Commonly known as:  COLACE  Take 100 mg by mouth daily as needed for mild constipation.     HYDROcodone-acetaminophen 5-325 MG tablet  Commonly known as:  NORCO/VICODIN  Take 1-2 tablets by mouth every 6 (six) hours as needed for moderate pain.     lisinopril-hydrochlorothiazide 20-25 MG tablet  Commonly known as:  PRINZIDE,ZESTORETIC  Take 1 tablet by mouth daily.             Follow-up Information    Follow up with [facility], MD. Schedule an appointment as soon as possible for a visit in 2 weeks.   Specialty:  General Surgery   Why:  post op check   Contact information:   [address] [place] [address] [phone]       Signed: Erby Pian, [facility] [phone]  12/28/2015, 10:50 AM

## 2015-12-28 NOTE — Progress Notes (Signed)
Discharge instructions given to patient. Home medications gone over. Follow up appointment to be made. Prescription given. Diet, activity, and incisional instructions gone over. Signs and symptoms of infection and reasons to call the doctor gone over. Patient verbalized understanding of instructions.

## 2015-12-30 ENCOUNTER — Encounter (HOSPITAL_COMMUNITY): Payer: Self-pay | Admitting: Surgery

## 2016-02-10 ENCOUNTER — Ambulatory Visit: Payer: BLUE CROSS/BLUE SHIELD | Admitting: Internal Medicine

## 2016-02-12 ENCOUNTER — Ambulatory Visit (INDEPENDENT_AMBULATORY_CARE_PROVIDER_SITE_OTHER): Payer: BLUE CROSS/BLUE SHIELD | Admitting: Internal Medicine

## 2016-02-12 ENCOUNTER — Encounter: Payer: Self-pay | Admitting: Internal Medicine

## 2016-02-12 ENCOUNTER — Other Ambulatory Visit (INDEPENDENT_AMBULATORY_CARE_PROVIDER_SITE_OTHER): Payer: BLUE CROSS/BLUE SHIELD

## 2016-02-12 VITALS — BP 112/84 | HR 100 | Temp 98.9°F | Resp 18 | Wt 388.0 lb

## 2016-02-12 DIAGNOSIS — R109 Unspecified abdominal pain: Secondary | ICD-10-CM | POA: Diagnosis not present

## 2016-02-12 DIAGNOSIS — I10 Essential (primary) hypertension: Secondary | ICD-10-CM

## 2016-02-12 LAB — COMPREHENSIVE METABOLIC PANEL
ALK PHOS: 68 U/L (ref 39–117)
ALT: 6 U/L (ref 0–35)
AST: 12 U/L (ref 0–37)
Albumin: 3.8 g/dL (ref 3.5–5.2)
BILIRUBIN TOTAL: 0.3 mg/dL (ref 0.2–1.2)
BUN: 12 mg/dL (ref 6–23)
CO2: 30 mEq/L (ref 19–32)
Calcium: 9.5 mg/dL (ref 8.4–10.5)
Chloride: 103 mEq/L (ref 96–112)
Creatinine, Ser: 0.97 mg/dL (ref 0.40–1.20)
GFR: 79.62 mL/min (ref >60.00)
GLUCOSE: 110 mg/dL — AB (ref 70–99)
Potassium: 3.5 mEq/L (ref 3.5–5.1)
SODIUM: 139 meq/L (ref 135–145)
TOTAL PROTEIN: 8.3 g/dL (ref 6.0–8.3)

## 2016-02-12 LAB — CBC WITH DIFFERENTIAL/PLATELET
BASOS ABS: 0 10*3/uL (ref 0.0–0.1)
Basophils Relative: 0.2 % (ref 0.0–3.0)
EOS ABS: 0.3 10*3/uL (ref 0.0–0.7)
Eosinophils Relative: 2.3 % (ref 0.0–5.0)
HCT: 38.2 % (ref 36.0–46.0)
Hemoglobin: 12.7 g/dL (ref 12.0–15.0)
LYMPHS ABS: 4.2 10*3/uL — AB (ref 0.7–4.0)
Lymphocytes Relative: 28.9 % (ref 12.0–46.0)
MCHC: 33.3 g/dL (ref 30.0–36.0)
MCV: 95.4 fl (ref 78.0–100.0)
MONO ABS: 0.7 10*3/uL (ref 0.1–1.0)
Monocytes Relative: 4.5 % (ref 3.0–12.0)
NEUTROS PCT: 64.1 % (ref 43.0–77.0)
Neutro Abs: 9.4 10*3/uL — ABNORMAL HIGH (ref 1.4–7.7)
Platelets: 381 10*3/uL (ref 150.0–400.0)
RBC: 4 Mil/uL (ref 3.87–5.11)
RDW: 14.8 % (ref 11.5–15.5)
WBC: 14.6 10*3/uL — ABNORMAL HIGH (ref 4.0–10.5)

## 2016-02-12 LAB — LIPASE: Lipase: 41 U/L (ref 11.0–59.0)

## 2016-02-12 LAB — AMYLASE: AMYLASE: 56 U/L (ref 27–131)

## 2016-02-12 NOTE — Progress Notes (Signed)
Subjective:    [name], female    DOB: [birth year], 45 y.o.   MRN: [id number]  HPI She is here for follow-up for her hypertension. She is also here for abdominal pain.  She has followed up with her gynecologist regarding her ovarian cysts. He did exam and placed her on a antibiotic recently for infection she is unsure what type of infection. She was placed on doxycycline and has not been tolerating it (nausea, diarrhea, headaches and lightheadedness). She has not called her gynecologist to discuss this.  She has been experiencing lower abdominal pain.  Last night when she was laying down she had pain in her RMQ. When she takes a deep breath she has a sharp pain in the right middle abdomen. She does not feel it much when she is sitting.  She did feel it when she was walking.  She had her GB removed January. She had an incarcerated umbilical hernia repair a few days later.  She has not eaten much today.    Hypertension: She is taking her medication daily. She is compliant with a low sodium diet.  She denies chest pain, palpitations, edema, shortness of breath and regular headaches. She is exercising minimally.  She does not monitor her blood pressure at home.     Medications and allergies reviewed with patient and updated if appropriate.  Patient Active Problem List   Diagnosis Date Noted  . Incarcerated hernia 12/27/2015  . Chronic cholecystitis with calculus 12/20/2015  . HTN (hypertension), benign 11/11/2015  . Morbid obesity ([place]) 11/11/2015  . Gallstones 11/11/2015  . Adrenal mass ([place]) 08/30/2015  . Ovarian cyst 08/30/2015    Current Outpatient Prescriptions on File Prior to Visit  Medication Sig Dispense Refill  . acetaminophen (TYLENOL) 325 MG tablet You can take plain tylenol (acetaminophen) or you can take the pain pill prescribed which also has Tylenol in it.  You cannot take more than 4000 mg of Tylenol per day so you have to count what is in your prescription  pill also.    Marland Kitchen amLODipine (NORVASC) 10 MG tablet Take 1 tablet (10 mg total) by mouth daily. 90 tablet 3  . docusate sodium (COLACE) 100 MG capsule Take 100 mg by mouth daily as needed for mild constipation.    Marland Kitchen HYDROcodone-acetaminophen (NORCO/VICODIN) 5-325 MG tablet Take 1-2 tablets by mouth every 6 (six) hours as needed for moderate pain. 40 tablet 0  . lisinopril-hydrochlorothiazide (PRINZIDE,ZESTORETIC) 20-25 MG tablet Take 1 tablet by mouth daily. 90 tablet 3  . [DISCONTINUED] losartan-hydrochlorothiazide (HYZAAR) 50-12.5 MG per tablet Take 1 tablet by mouth daily. 30 tablet 5   No current facility-administered medications on file prior to visit.    Past Medical History  Diagnosis Date  . Hypertension   . Obesity     Past Surgical History  Procedure Laterality Date  . Cholecystectomy N/A 12/21/2015    Procedure: LAPAROSCOPIC CHOLECYSTECTOMY;  Surgeon: Georganna Skeans, MD;  Location: [facility];  Service: General;  Laterality: N/A;  . Umbilical hernia repair N/A 12/27/2015    Procedure: INCARCERATED UMBILICAL HERNIA REPAIR;  Surgeon: Coralie Keens, MD;  Location: [facility];  Service: General;  Laterality: N/A;    Social History   Social History  . Marital Status: Married    Spouse Name: N/A  . Number of Children: N/A  . Years of Education: N/A   Social History Main Topics  . Smoking status: Current Every Day Smoker -- 0.50 packs/day for 10 years  Types: Cigarettes  . Smokeless tobacco: Not on file     Comment: plans on quitting new years  . Alcohol Use: 0.0 oz/week    0 Standard drinks or equivalent per week     Comment: sometimes  . Drug Use: Yes    Special: Marijuana  . Sexual Activity: Not on file   Other Topics Concern  . Not on file   Social History Narrative    Family History  Problem Relation Age of Onset  . Arthritis Mother   . Adrenal disorder Neg Hx     Review of Systems  Constitutional: Positive for appetite change (decreased). Negative for fever  and chills.  Respiratory: Positive for cough (dry). Negative for shortness of breath and wheezing.   Cardiovascular: Negative for chest pain, palpitations and leg swelling.  Gastrointestinal: Positive for nausea, abdominal pain and diarrhea.  Genitourinary: Positive for vaginal pain. Negative for dysuria and hematuria.  Neurological: Positive for dizziness, light-headedness and headaches.       Objective:   Filed Vitals:   02/12/16 1605  BP: 112/84  Pulse: 100  Temp: 98.9 F (37.2 C)  Resp: 18   Filed Weights   02/12/16 1605  Weight: 388 lb (175.996 kg)   Body mass index is 59.01 kg/(m^2).   Physical Exam Constitutional: Appears well-developed and well-nourished. No distress.  Neck: Neck supple. No tracheal deviation present. No thyromegaly present.  No carotid bruit. No cervical adenopathy.   Cardiovascular: Normal rate, regular rhythm and normal heart sounds.   No murmur heard.  No edema Pulmonary/Chest: Effort normal and breath sounds normal. No respiratory distress. No wheezes.  Abdomen:     Assessment & Plan:   Abdominal pain She is experiencing chronic lower abdominal pain that is thought to be gynecological-she does have ovarian cysts and possibly an infection She has recently seen her gynecologist and has a follow-up soon She was placed on doxycycline and it has not been helping and she has not been tolerating. Stressed contacting her gynecologist tomorrow to discuss this The pain she is experiencing is likely related, but the side effects from the antibiotic makes it difficult to know for sure I doubt UTI or kidney stone given lack of urinary symptoms There is a possibility of a retained stone and biliary duct, but this seems less likely. Will check LFTs, CBC amylase and lipase today If above blood work normal she will need to follow-up with her gynecologist as soon as possible  See Problem List for Assessment and Plan of chronic medical problems.  Follow-up  in 6 months, sooner if needed

## 2016-02-12 NOTE — Progress Notes (Signed)
Pre visit review using our clinic review tool, if applicable. No additional management support is needed unless otherwise documented below in the visit note. 

## 2016-02-12 NOTE — Patient Instructions (Addendum)
   Test(s) ordered today. Your results will be released to [place] (or called to you) after review, usually within 72hours after test completion. If any changes need to be made, you will be notified at that same time.  Call gyn tomorrow to follow up with them.  Medications reviewed and updated.  No changes recommended at this time.   Follow up in 6 months.

## 2016-02-12 NOTE — Assessment & Plan Note (Signed)
Blood pressure well-controlled Continue current medication Work on weight loss Regular exercise

## 2016-02-13 ENCOUNTER — Telehealth: Payer: Self-pay | Admitting: Internal Medicine

## 2016-02-13 NOTE — Telephone Encounter (Signed)
Please call pt back regarding her labs at [phone]

## 2016-02-13 NOTE — Telephone Encounter (Signed)
Spoke with pt to inform.  

## 2016-03-06 DIAGNOSIS — D72829 Elevated white blood cell count, unspecified: Secondary | ICD-10-CM | POA: Diagnosis not present

## 2016-03-16 ENCOUNTER — Ambulatory Visit: Payer: BLUE CROSS/BLUE SHIELD | Admitting: Family Medicine

## 2016-03-23 DIAGNOSIS — D72829 Elevated white blood cell count, unspecified: Secondary | ICD-10-CM | POA: Diagnosis not present

## 2016-04-03 ENCOUNTER — Other Ambulatory Visit (INDEPENDENT_AMBULATORY_CARE_PROVIDER_SITE_OTHER): Payer: BLUE CROSS/BLUE SHIELD

## 2016-04-03 ENCOUNTER — Ambulatory Visit (INDEPENDENT_AMBULATORY_CARE_PROVIDER_SITE_OTHER): Payer: BLUE CROSS/BLUE SHIELD | Admitting: Internal Medicine

## 2016-04-03 ENCOUNTER — Encounter: Payer: Self-pay | Admitting: Internal Medicine

## 2016-04-03 VITALS — BP 138/100 | HR 81 | Temp 98.0°F | Resp 16 | Wt 379.0 lb

## 2016-04-03 DIAGNOSIS — Z114 Encounter for screening for human immunodeficiency virus [HIV]: Secondary | ICD-10-CM | POA: Diagnosis not present

## 2016-04-03 DIAGNOSIS — M2550 Pain in unspecified joint: Secondary | ICD-10-CM | POA: Diagnosis not present

## 2016-04-03 DIAGNOSIS — L729 Follicular cyst of the skin and subcutaneous tissue, unspecified: Secondary | ICD-10-CM | POA: Diagnosis not present

## 2016-04-03 DIAGNOSIS — I10 Essential (primary) hypertension: Secondary | ICD-10-CM

## 2016-04-03 LAB — C-REACTIVE PROTEIN: CRP: 1.3 mg/dL (ref 0.5–20.0)

## 2016-04-03 LAB — CBC WITH DIFFERENTIAL/PLATELET
BASOS PCT: 0.4 % (ref 0.0–3.0)
Basophils Absolute: 0 10*3/uL (ref 0.0–0.1)
Eosinophils Absolute: 0.3 10*3/uL (ref 0.0–0.7)
Eosinophils Relative: 2.8 % (ref 0.0–5.0)
HEMATOCRIT: 36.5 % (ref 36.0–46.0)
Hemoglobin: 12.3 g/dL (ref 12.0–15.0)
LYMPHS ABS: 4 10*3/uL (ref 0.7–4.0)
LYMPHS PCT: 37 % (ref 12.0–46.0)
MCHC: 33.7 g/dL (ref 30.0–36.0)
MCV: 95.2 fl (ref 78.0–100.0)
MONOS PCT: 6 % (ref 3.0–12.0)
Monocytes Absolute: 0.6 10*3/uL (ref 0.1–1.0)
NEUTROS ABS: 5.8 10*3/uL (ref 1.4–7.7)
NEUTROS PCT: 53.8 % (ref 43.0–77.0)
PLATELETS: 348 10*3/uL (ref 150.0–400.0)
RBC: 3.83 Mil/uL — ABNORMAL LOW (ref 3.87–5.11)
RDW: 14.8 % (ref 11.5–15.5)
WBC: 10.8 10*3/uL — ABNORMAL HIGH (ref 4.0–10.5)

## 2016-04-03 LAB — COMPREHENSIVE METABOLIC PANEL
ALBUMIN: 3.8 g/dL (ref 3.5–5.2)
ALK PHOS: 60 U/L (ref 39–117)
ALT: 6 U/L (ref 0–35)
AST: 11 U/L (ref 0–37)
BUN: 11 mg/dL (ref 6–23)
CALCIUM: 9.2 mg/dL (ref 8.4–10.5)
CHLORIDE: 104 meq/L (ref 96–112)
CO2: 26 mEq/L (ref 19–32)
CREATININE: 0.91 mg/dL (ref 0.40–1.20)
GFR: 85.65 mL/min (ref >60.00)
Glucose, Bld: 99 mg/dL (ref 70–99)
POTASSIUM: 3.2 meq/L — AB (ref 3.5–5.1)
SODIUM: 139 meq/L (ref 135–145)
TOTAL PROTEIN: 8 g/dL (ref 6.0–8.3)
Total Bilirubin: 0.4 mg/dL (ref 0.2–1.2)

## 2016-04-03 LAB — RHEUMATOID FACTOR: RHEUMATOID FACTOR: 13 [IU]/mL (ref <=14)

## 2016-04-03 LAB — SEDIMENTATION RATE: Sed Rate: 89 mm/hr — ABNORMAL HIGH (ref 0–22)

## 2016-04-03 MED ORDER — HYDROCHLOROTHIAZIDE 25 MG PO TABS: 25.0000 mg | ORAL_TABLET | Freq: Every day | ORAL | Status: DC

## 2016-04-03 MED ORDER — LISINOPRIL 30 MG PO TABS: 30.0000 mg | ORAL_TABLET | Freq: Every day | ORAL | Status: DC

## 2016-04-03 NOTE — Assessment & Plan Note (Signed)
Persistently elevated white blood cell count-concerning for possible rheumatoid arthritis, especially given her family history-both her mother and maternal grandmother have matured arthritis She does experience joint pain has gotten worse recently. No joint swelling Will check blood work today and determine if she needs to see rheumatologist

## 2016-04-03 NOTE — Progress Notes (Signed)
Subjective:    [name], female    DOB: 1971/09/28, 45 y.o.   MRN: [id number]  HPI She is here for an acute visit for elevated white blood cell count.  She is here because her WBC has been persistently elevated.  Her gynecologist recently did her CBC and repeated a couple of times and has remained elevated. She does have a family history of RA (mom and grandmother).  She did stop going hair a few years ago because she was having hand pain.  She does have some joint pain, but thought the pain was related to prior injuries.  She has noticed that more recently the pain has gotten worse. If she picks up her grandson she does notice she is not able to hang on to him long - she gets a sharp pain in her hands and shoulders.  She denies swelling in her joints.  Some mornings she is ok, but other mornings she feels tired and everything hurts.    Hypertension: She is taking her medication daily. She is compliant with a low sodium diet.  She denies chest pain, palpitations, edema, shortness of breath and regular headaches. She is not exercising regularly, but up until recently was doing walking regularly and was able to lose weight.  She does not monitor her blood pressure at home.    She has a cyst on her upper abdomen under her skin that is uncomfortable at times. She was wondering how she could get that removed.  She has also noticed a cyst near her breast nipple and was able to extract some pus and blood from it. She denies any current symptoms. She has had one mammogram, but it has been a few years. She did see her gynecologist last month, but did not mention this to him.  Medications and allergies reviewed with patient and updated if appropriate.  Patient Active Problem List   Diagnosis Date Noted  . AP (abdominal pain) 02/12/2016  . Incarcerated hernia 12/27/2015  . HTN (hypertension), benign 11/11/2015  . Morbid obesity ([place]) 11/11/2015  . Adrenal mass ([place]) 08/30/2015  .  Ovarian cyst 08/30/2015    Current Outpatient Prescriptions on File Prior to Visit  Medication Sig Dispense Refill  . amLODipine (NORVASC) 10 MG tablet Take 1 tablet (10 mg total) by mouth daily. 90 tablet 3  . lisinopril-hydrochlorothiazide (PRINZIDE,ZESTORETIC) 20-25 MG tablet Take 1 tablet by mouth daily. 90 tablet 3  . [DISCONTINUED] losartan-hydrochlorothiazide (HYZAAR) 50-12.5 MG per tablet Take 1 tablet by mouth daily. 30 tablet 5   No current facility-administered medications on file prior to visit.    Past Medical History  Diagnosis Date  . Hypertension   . Obesity     Past Surgical History  Procedure Laterality Date  . Cholecystectomy N/A 12/21/2015    Procedure: LAPAROSCOPIC CHOLECYSTECTOMY;  Surgeon: Georganna Skeans, MD;  Location: [facility];  Service: General;  Laterality: N/A;  . Umbilical hernia repair N/A 12/27/2015    Procedure: INCARCERATED UMBILICAL HERNIA REPAIR;  Surgeon: Coralie Keens, MD;  Location: [facility];  Service: General;  Laterality: N/A;    Social History   Social History  . Marital Status: Married    Spouse Name: N/A  . Number of Children: N/A  . Years of Education: N/A   Social History Main Topics  . Smoking status: Current Every Day Smoker -- 0.50 packs/day for 10 years    Types: Cigarettes  . Smokeless tobacco: Not on file  Comment: plans on quitting new years  . Alcohol Use: 0.0 oz/week    0 Standard drinks or equivalent per week     Comment: sometimes  . Drug Use: Yes    Special: Marijuana  . Sexual Activity: Not on file   Other Topics Concern  . Not on file   Social History Narrative    Family History  Problem Relation Age of Onset  . Arthritis Mother   . Adrenal disorder Neg Hx     Review of Systems  Constitutional: Positive for fatigue. Negative for fever and chills.  Respiratory: Positive for cough. Negative for shortness of breath and wheezing.   Cardiovascular: Positive for chest pain (yesterday - right sided).  Negative for palpitations and leg swelling.  Gastrointestinal: Negative for nausea.       No gerd  Musculoskeletal: Positive for myalgias (legs - intermittent), back pain and arthralgias (hands Left > right, left wrist, left elbow, left shoulder, left > right ankle, left hip, mild knee pain; no feet pain). Negative for joint swelling.  Skin: Negative for rash.  Neurological: Positive for dizziness (recently has had a couple of episodes) and headaches (occasional).       Objective:   Filed Vitals:   04/03/16 0942  BP: 138/100  Pulse: 81  Temp: 98 F (36.7 C)  Resp: 16   Filed Weights   04/03/16 0942  Weight: 379 lb (171.913 kg)   Body mass index is 57.64 kg/(m^2).   Physical Exam  Constitutional: She appears well-developed and well-nourished. No distress.  HENT:  Head: Normocephalic and atraumatic.  Cardiovascular: Normal rate, regular rhythm and normal heart sounds.   No murmur heard. Pulmonary/Chest: Effort normal and breath sounds normal. No respiratory distress. She has no wheezes. She has no rales.  Musculoskeletal: Normal range of motion. She exhibits no edema.  No joint deformities, no joint swelling, slight tenderness with palpation of the PIP joints on left hand, slight pain when she makes a fist with her left hand  Skin: Skin is warm and dry. No rash noted. She is not diaphoretic.  Breast exam deferred  Psychiatric: She has a normal mood and affect. Her behavior is normal.          Assessment & Plan:   Referred to dermatology for further evaluation and possible removal of her skin cyst  Stressed the importance of getting a mammogram-her last mammogram did show some abnormality and recommended close follow-up. She will call today to schedule   See Problem List for Assessment and Plan of chronic medical problems.

## 2016-04-03 NOTE — Progress Notes (Signed)
Pre visit review using our clinic review tool, if applicable. No additional management support is needed unless otherwise documented below in the visit note. 

## 2016-04-03 NOTE — Patient Instructions (Addendum)
Schedule your mammogram.  Test(s) ordered today. Your results will be released to [facility] (or called to you) after review, usually within 72hours after test completion. If any changes need to be made, you will be notified at that same time.  All other Health Maintenance issues reviewed.   All recommended immunizations and age-appropriate screenings are up-to-date or discussed.  No immunizations administered today.   Medications reviewed and updated.  Changes include increasing your dose of lisinopril to 30 mg daily and continuing your current dose of hydrochlorothiazide and amlodipine.   Your prescription(s) have been submitted to your pharmacy. Please take as directed and contact our office if you believe you are having problem(s) with the medication(s).  A referral was ordered for dermatology.

## 2016-04-03 NOTE — Assessment & Plan Note (Addendum)
Blood pressure not adequately controlled Continue amlodipine 10 mg daily Continue hydrochlorothiazide 25 mg daily Increase lisinopril from 20 mg to 30 mg daily Continue work on weight loss Encouraged regular exercise-and was walking Encouraged her to monitor her blood pressure at home

## 2016-04-04 LAB — HIV ANTIBODY (ROUTINE TESTING W REFLEX): HIV 1&2 Ab, 4th Generation: NONREACTIVE

## 2016-04-06 LAB — CYCLIC CITRUL PEPTIDE ANTIBODY, IGG: Cyclic Citrullin Peptide Ab: 17 Units

## 2016-04-07 ENCOUNTER — Other Ambulatory Visit: Payer: Self-pay | Admitting: Internal Medicine

## 2016-04-07 DIAGNOSIS — R768 Other specified abnormal immunological findings in serum: Secondary | ICD-10-CM

## 2016-04-07 DIAGNOSIS — R700 Elevated erythrocyte sedimentation rate: Secondary | ICD-10-CM

## 2016-04-07 LAB — ANA: ANA: POSITIVE — AB

## 2016-04-07 LAB — ANTI-NUCLEAR AB-TITER (ANA TITER)

## 2016-04-07 MED ORDER — POTASSIUM CHLORIDE CRYS ER 20 MEQ PO TBCR: 20.0000 meq | EXTENDED_RELEASE_TABLET | Freq: Every day | ORAL | Status: DC

## 2016-04-08 ENCOUNTER — Encounter: Payer: Self-pay | Admitting: Internal Medicine

## 2016-04-08 ENCOUNTER — Telehealth: Payer: Self-pay | Admitting: Internal Medicine

## 2016-04-08 NOTE — Telephone Encounter (Signed)
The plan was :  Continue amlodipine 10 mg daily Continue hydrochlorothiazide 25 mg daily Increase lisinopril from 20 mg to 30 mg daily

## 2016-04-08 NOTE — Telephone Encounter (Signed)
Please advise 

## 2016-04-08 NOTE — Telephone Encounter (Signed)
Spoke with pt to inform.  

## 2016-04-08 NOTE — Telephone Encounter (Signed)
Pt called stated that last ov Dr. Quay Burow want to up the dosage on one of her BP med ( she is taking Amlodipine and hydrochlorothiazide together as 1 pill) and separate the the pill. Pt stated we sent rx in but it is the same order. Please call her back

## 2016-04-15 ENCOUNTER — Encounter: Payer: Self-pay | Admitting: Internal Medicine

## 2016-04-17 DIAGNOSIS — L720 Epidermal cyst: Secondary | ICD-10-CM | POA: Diagnosis not present

## 2016-04-21 DIAGNOSIS — M79675 Pain in left toe(s): Secondary | ICD-10-CM | POA: Diagnosis not present

## 2016-04-21 DIAGNOSIS — L600 Ingrowing nail: Secondary | ICD-10-CM | POA: Diagnosis not present

## 2016-04-21 DIAGNOSIS — M79674 Pain in right toe(s): Secondary | ICD-10-CM | POA: Diagnosis not present

## 2016-05-06 DIAGNOSIS — L723 Sebaceous cyst: Secondary | ICD-10-CM | POA: Diagnosis not present

## 2016-05-20 DIAGNOSIS — M2550 Pain in unspecified joint: Secondary | ICD-10-CM | POA: Diagnosis not present

## 2016-05-20 DIAGNOSIS — M7989 Other specified soft tissue disorders: Secondary | ICD-10-CM | POA: Diagnosis not present

## 2016-05-20 DIAGNOSIS — R768 Other specified abnormal immunological findings in serum: Secondary | ICD-10-CM | POA: Diagnosis not present

## 2016-05-20 DIAGNOSIS — R700 Elevated erythrocyte sedimentation rate: Secondary | ICD-10-CM | POA: Diagnosis not present

## 2016-05-20 DIAGNOSIS — R5383 Other fatigue: Secondary | ICD-10-CM | POA: Diagnosis not present

## 2016-06-09 DIAGNOSIS — R700 Elevated erythrocyte sedimentation rate: Secondary | ICD-10-CM | POA: Diagnosis not present

## 2016-06-09 DIAGNOSIS — M2550 Pain in unspecified joint: Secondary | ICD-10-CM | POA: Diagnosis not present

## 2016-06-15 ENCOUNTER — Encounter: Payer: Self-pay | Admitting: Hematology and Oncology

## 2016-06-15 ENCOUNTER — Telehealth: Payer: Self-pay | Admitting: Hematology and Oncology

## 2016-06-15 NOTE — Telephone Encounter (Signed)
Appointment with Dr. Alvy Bimler on 8/3 at 11:30am. Patient aware to arrive 30 minutes early. Location and address given. Demographics verified. Letter to the referring.

## 2016-07-02 ENCOUNTER — Encounter: Payer: Self-pay | Admitting: Hematology and Oncology

## 2016-07-02 ENCOUNTER — Other Ambulatory Visit: Payer: BLUE CROSS/BLUE SHIELD

## 2016-07-02 ENCOUNTER — Telehealth: Payer: Self-pay | Admitting: Hematology and Oncology

## 2016-07-02 ENCOUNTER — Ambulatory Visit (HOSPITAL_BASED_OUTPATIENT_CLINIC_OR_DEPARTMENT_OTHER): Payer: BLUE CROSS/BLUE SHIELD | Admitting: Hematology and Oncology

## 2016-07-02 DIAGNOSIS — E559 Vitamin D deficiency, unspecified: Secondary | ICD-10-CM | POA: Insufficient documentation

## 2016-07-02 DIAGNOSIS — R197 Diarrhea, unspecified: Secondary | ICD-10-CM | POA: Insufficient documentation

## 2016-07-02 DIAGNOSIS — D472 Monoclonal gammopathy: Secondary | ICD-10-CM | POA: Diagnosis not present

## 2016-07-02 DIAGNOSIS — K591 Functional diarrhea: Secondary | ICD-10-CM

## 2016-07-02 NOTE — Progress Notes (Signed)
[facility] CONSULT NOTE  Patient Care Team: Binnie Rail, MD as PCP - General (Internal Medicine)  CHIEF COMPLAINTS/PURPOSE OF CONSULTATION:  Abnormal serum protein electrophoresis  HISTORY OF PRESENTING ILLNESS:  Alexandra Powers 45 y.o. female is here because of chronic joint pain and abnormal protein electrophoresis. The patient had problems with chronic back pain. She denies prior history of trauma. The pain has been present for over a year. She was prescribed intermittent doses of NSAID and Vicodin for this. The pains is located in the shoulder, the back, and the knees with associated morning stiffness. The patient had cholecystectomy several months ago and since then had postprandial nausea, vomiting usually within 30 minutes and associated diarrhea 4-5 times a day with abdominal cramps. She have lost almost 40 pounds over the past few months. She was subsequently referred to rheumatologist. On 05/20/2016, blood work show high sedimentation rate of 67, C-reactive protein of 11.9, abnormal serum protein electrophoresis with detectable M spike but negative autoimmune screen with ANA. Hepatitis screen was negative. She denies history of abnormal bone fracture. Patient denies history of recurrent infection or atypical infections such as shingles of meningitis. Denies chills, night sweats or anorexia   MEDICAL HISTORY:  Past Medical History:  Diagnosis Date  . Hypertension   . Obesity     SURGICAL HISTORY: Past Surgical History:  Procedure Laterality Date  . CHOLECYSTECTOMY N/A 12/21/2015   Procedure: LAPAROSCOPIC CHOLECYSTECTOMY;  Surgeon: Georganna Skeans, MD;  Location: [facility];  Service: General;  Laterality: N/A;  . UMBILICAL HERNIA REPAIR N/A 12/27/2015   Procedure: INCARCERATED UMBILICAL HERNIA REPAIR;  Surgeon: Coralie Keens, MD;  Location: [facility];  Service: General;  Laterality: N/A;    SOCIAL HISTORY: Social History   Social History  . Marital status:  Married    Spouse name: N/A  . Number of children: 2  . Years of education: N/A   Occupational History  .      unemploed   Social History Main Topics  . Smoking status: Current Every Day Smoker    Packs/day: 0.50    Years: 24.00    Types: Cigarettes  . Smokeless tobacco: Never Used     Comment: plans on quitting new years  . Alcohol use 2.4 oz/week    4 Shots of liquor per week     Comment: sometimes  . Drug use:     Types: Marijuana  . Sexual activity: Not on file   Other Topics Concern  . Not on file   Social History Narrative   Lives with husband, Berneta Sages.    FAMILY HISTORY: Family History  Problem Relation Age of Onset  . Arthritis Mother   . Cancer Paternal Aunt     breast ca  . Adrenal disorder Neg Hx     ALLERGIES:  has No Known Allergies.  MEDICATIONS:  Current Outpatient Prescriptions  Medication Sig Dispense Refill  . amLODipine (NORVASC) 10 MG tablet Take 1 tablet (10 mg total) by mouth daily. 90 tablet 3  . hydrochlorothiazide (HYDRODIURIL) 25 MG tablet Take 1 tablet (25 mg total) by mouth daily. 90 tablet 3  . lisinopril (PRINIVIL,ZESTRIL) 30 MG tablet Take 1 tablet (30 mg total) by mouth daily. 90 tablet 3  . potassium chloride SA (K-DUR,KLOR-CON) 20 MEQ tablet Take 1 tablet (20 mEq total) by mouth daily. 30 tablet 3   No current facility-administered medications for this visit.     REVIEW OF SYSTEMS:   Eyes: Denies blurriness of vision, double  vision or watery eyes Ears, nose, mouth, throat, and face: Denies mucositis or sore throat Respiratory: Denies cough, dyspnea or wheezes Cardiovascular: Denies palpitation, chest discomfort or lower extremity swelling Skin: Denies abnormal skin rashes Lymphatics: Denies new lymphadenopathy or easy bruising Neurological:Denies numbness, tingling or new weaknesses Behavioral/Psych: Mood is stable, no new changes  All other systems were reviewed with the patient and are negative.  PHYSICAL  EXAMINATION: ECOG PERFORMANCE STATUS: 1 - Symptomatic but completely ambulatory  Vitals:   07/02/16 1114  BP: (!) 127/94  Pulse: 81  Resp: (!) 21  Temp: 98.4 F (36.9 C)   Filed Weights   07/02/16 1114  Weight: (!) 369 lb 1.6 oz (167.4 kg)    GENERAL:alert, no distress and comfortable. She is morbidly obese SKIN: skin color, texture, turgor are normal, no rashes or significant lesions EYES: normal, conjunctiva are pink and non-injected, sclera clear OROPHARYNX:no exudate, no erythema and lips, buccal mucosa, and tongue normal  NECK: supple, thyroid normal size, non-tender, without nodularity LYMPH:  no palpable lymphadenopathy in the cervical, axillary or inguinal LUNGS: clear to auscultation and percussion with normal breathing effort HEART: regular rate & rhythm and no murmurs and no lower extremity edema ABDOMEN:abdomen soft, non-tender and normal bowel sounds Musculoskeletal:no cyanosis of digits and no clubbing  PSYCH: alert & oriented x 3 with fluent speech NEURO: no focal motor/sensory deficits  LABORATORY DATA:  I have reviewed the data as listed Lab Results  Component Value Date   WBC 10.8 (H) 04/03/2016   HGB 12.3 04/03/2016   HCT 36.5 04/03/2016   MCV 95.2 04/03/2016   PLT 348.0 04/03/2016    ASSESSMENT & PLAN:  MGUS (monoclonal gammopathy of unknown significance) It is not clear that the recent abnormal serum protein electrophoresis is the cause of her chronic joint pain. I will order additional workup for further evaluation  Vitamin D deficiency I suspect she may have vitamin D deficiency due to chronic diarrhea and possible malabsorption. 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 We discussed dietary modification. I do not think she needs Cholestyramine for now   Orders Placed This Encounter  Procedures  .  DG Bone Survey Met    Standing Status:   Future    Standing Expiration Date:   09/01/2017    Order Specific Question:   Reason for Exam (SYMPTOM  OR DIAGNOSIS REQUIRED)    Answer:   staging myeloma    Order Specific Question:   Is the patient pregnant?    Answer:   No    Order Specific Question:   Preferred imaging location?    Answer:   [facility]  . Comprehensive metabolic panel    Standing Status:   Future    Standing Expiration Date:   09/01/2017  . CBC with Differential/Platelet    Standing Status:   Future    Standing Expiration Date:   09/01/2017  . Kappa/lambda light chains    Standing Status:   Future    Standing Expiration Date:   09/01/2017  . Multiple Myeloma Panel (SPEP&IFE w/QIG)    Standing Status:   Future    Standing Expiration Date:   09/01/2017  . IFE/Light Chains/TP Qn, 24-Hr Ur    Standing Status:   Future    Standing Expiration Date:   09/01/2017  . Beta 2 microglobulin, serum  Standing Status:   Future    Standing Expiration Date:   09/01/2017  . Vitamin D 25 hydroxy    Standing Status:   Future    Standing Expiration Date:   09/01/2017    All questions were answered. The patient knows to call the clinic with any problems, questions or concerns. I spent 45 minutes counseling the patient face to face. The total time spent in the appointment was 55 minutes and more than 50% was on counseling.     [facility], [place], MD 07/02/16 4:14 PM

## 2016-07-02 NOTE — Telephone Encounter (Signed)
Gv pt appts for 8/14 + 8/24. Sent pt to lab today for jug and advised on xray on 8/14 as a walkin.

## 2016-07-03 NOTE — Assessment & Plan Note (Signed)
It is not clear that the recent abnormal serum protein electrophoresis is the cause of her chronic joint pain. I will order additional workup for further evaluation

## 2016-07-03 NOTE — Assessment & Plan Note (Signed)
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 We discussed dietary modification. I do not think she needs Cholestyramine for now

## 2016-07-03 NOTE — Assessment & Plan Note (Signed)
I suspect she may have vitamin D deficiency due to chronic diarrhea and possible malabsorption. I will order additional workup for this

## 2016-07-13 ENCOUNTER — Ambulatory Visit (HOSPITAL_COMMUNITY)
Admission: RE | Admit: 2016-07-13 | Discharge: 2016-07-13 | Disposition: A | Discharge status: Home / Self Care | Payer: BLUE CROSS/BLUE SHIELD | Source: Ambulatory Visit | Attending: Hematology and Oncology | Admitting: Hematology and Oncology

## 2016-07-13 ENCOUNTER — Other Ambulatory Visit (HOSPITAL_BASED_OUTPATIENT_CLINIC_OR_DEPARTMENT_OTHER): Payer: BLUE CROSS/BLUE SHIELD

## 2016-07-13 DIAGNOSIS — D472 Monoclonal gammopathy: Secondary | ICD-10-CM

## 2016-07-13 DIAGNOSIS — E559 Vitamin D deficiency, unspecified: Secondary | ICD-10-CM

## 2016-07-13 DIAGNOSIS — C9000 Multiple myeloma not having achieved remission: Secondary | ICD-10-CM | POA: Diagnosis not present

## 2016-07-13 LAB — COMPREHENSIVE METABOLIC PANEL
ALBUMIN: 3.2 g/dL — AB (ref 3.5–5.0)
ALK PHOS: 64 U/L (ref 40–150)
ALT: 10 U/L (ref 0–55)
AST: 18 U/L (ref 5–34)
Anion Gap: 7 mEq/L (ref 3–11)
BUN: 14.7 mg/dL (ref 7.0–26.0)
CALCIUM: 9.6 mg/dL (ref 8.4–10.4)
CO2: 27 mEq/L (ref 22–29)
Chloride: 107 mEq/L (ref 98–109)
Creatinine: 1.2 mg/dL — ABNORMAL HIGH (ref 0.6–1.1)
EGFR: 65 mL/min/{1.73_m2} — AB (ref >90)
Glucose: 100 mg/dl (ref 70–140)
POTASSIUM: 3.7 meq/L (ref 3.5–5.1)
SODIUM: 141 meq/L (ref 136–145)
Total Bilirubin: <0.3 mg/dL (ref 0.20–1.20)
Total Protein: 8.1 g/dL (ref 6.4–8.3)

## 2016-07-13 LAB — CBC WITH DIFFERENTIAL/PLATELET
BASO%: 0.5 % (ref 0.0–2.0)
Basophils Absolute: 0.1 10*3/uL (ref 0.0–0.1)
EOS%: 3.5 % (ref 0.0–7.0)
Eosinophils Absolute: 0.4 10*3/uL (ref 0.0–0.5)
HEMATOCRIT: 35.2 % (ref 34.8–46.6)
HGB: 11.7 g/dL (ref 11.6–15.9)
LYMPH#: 4.5 10*3/uL — AB (ref 0.9–3.3)
LYMPH%: 44.5 % (ref 14.0–49.7)
MCH: 32.4 pg (ref 25.1–34.0)
MCHC: 33.2 g/dL (ref 31.5–36.0)
MCV: 97.5 fL (ref 79.5–101.0)
MONO#: 0.6 10*3/uL (ref 0.1–0.9)
MONO%: 5.6 % (ref 0.0–14.0)
NEUT%: 45.9 % (ref 38.4–76.8)
NEUTROS ABS: 4.7 10*3/uL (ref 1.5–6.5)
PLATELETS: 328 10*3/uL (ref 145–400)
RBC: 3.61 10*6/uL — ABNORMAL LOW (ref 3.70–5.45)
RDW: 13.4 % (ref 11.2–14.5)
WBC: 10.1 10*3/uL (ref 3.9–10.3)

## 2016-07-14 LAB — KAPPA/LAMBDA LIGHT CHAINS
IG LAMBDA FREE LIGHT CHAIN: 24.1 mg/L (ref 5.7–26.3)
Ig Kappa Free Light Chain: 44.7 mg/L — ABNORMAL HIGH (ref 3.3–19.4)
KAPPA/LAMBDA FLC RATIO: 1.85 — AB (ref 0.26–1.65)

## 2016-07-14 LAB — UIFE/LIGHT CHAINS/TP QN, 24-HR UR
FR KAPPA LT CH,24HR: 43 mg/(24.h)
FR LAMBDA LT CH,24HR: 1 mg/24 hr
FREE KAPPA LT CHAINS, UR: 61.1 mg/L — AB (ref 1.35–24.19)
Free Lambda Lt Chains,Ur: 2.05 mg/L (ref 0.24–6.66)
Kappa/Lambda Ratio,U: 29.8 — ABNORMAL HIGH (ref 2.04–10.37)
PROTEIN,TOTAL,URINE: 7.9 mg/dL
Prot,24hr calculated: 55 mg/24 hr (ref 30–150)

## 2016-07-14 LAB — MULTIPLE MYELOMA PANEL, SERUM
ALBUMIN/GLOB SERPL: 0.9 (ref 0.7–1.7)
ALPHA2 GLOB SERPL ELPH-MCNC: 0.7 g/dL (ref 0.4–1.0)
Albumin SerPl Elph-Mcnc: 3.3 g/dL (ref 2.9–4.4)
Alpha 1: 0.2 g/dL (ref 0.0–0.4)
B-GLOBULIN SERPL ELPH-MCNC: 1.4 g/dL — AB (ref 0.7–1.3)
GAMMA GLOB SERPL ELPH-MCNC: 1.6 g/dL (ref 0.4–1.8)
GLOBULIN, TOTAL: 4 g/dL — AB (ref 2.2–3.9)
IgA, Qn, Serum: 387 mg/dL — ABNORMAL HIGH (ref 87–352)
IgM, Qn, Serum: 86 mg/dL (ref 26–217)
M PROTEIN SERPL ELPH-MCNC: 0.3 g/dL — AB
Total Protein: 7.3 g/dL (ref 6.0–8.5)

## 2016-07-14 LAB — VITAMIN D 25 HYDROXY (VIT D DEFICIENCY, FRACTURES): VIT D 25 HYDROXY: 10.6 ng/mL — AB (ref 30.0–100.0)

## 2016-07-14 LAB — BETA 2 MICROGLOBULIN, SERUM: BETA 2: 2.2 mg/L (ref 0.6–2.4)

## 2016-07-23 ENCOUNTER — Telehealth: Payer: Self-pay | Admitting: Hematology and Oncology

## 2016-07-23 ENCOUNTER — Telehealth: Payer: Self-pay | Admitting: *Deleted

## 2016-07-23 ENCOUNTER — Ambulatory Visit: Payer: BLUE CROSS/BLUE SHIELD | Admitting: Hematology and Oncology

## 2016-07-23 NOTE — Telephone Encounter (Signed)
Pt left a message stating she will not be able to come in for today's appt. Is requesting Dr Alvy Bimler call her with test results. [phone]

## 2016-07-23 NOTE — Telephone Encounter (Signed)
APPT SCHD AND CONF WITH PATIENT, PER 07/23/16 SCHD MSG.

## 2016-07-23 NOTE — Telephone Encounter (Signed)
She may reschedule 2-3 weeks out. I cannot do counseling over the phone because it is complicated She needs annual follow-up after next meeting

## 2016-08-12 ENCOUNTER — Ambulatory Visit: Payer: BLUE CROSS/BLUE SHIELD | Admitting: Internal Medicine

## 2016-08-13 ENCOUNTER — Ambulatory Visit (HOSPITAL_BASED_OUTPATIENT_CLINIC_OR_DEPARTMENT_OTHER): Payer: BLUE CROSS/BLUE SHIELD | Admitting: Hematology and Oncology

## 2016-08-13 ENCOUNTER — Encounter: Payer: Self-pay | Admitting: Hematology and Oncology

## 2016-08-13 VITALS — BP 125/82 | HR 87 | Temp 98.5°F | Resp 18 | Wt 369.5 lb

## 2016-08-13 DIAGNOSIS — D472 Monoclonal gammopathy: Secondary | ICD-10-CM

## 2016-08-13 DIAGNOSIS — M2550 Pain in unspecified joint: Secondary | ICD-10-CM | POA: Diagnosis not present

## 2016-08-13 DIAGNOSIS — G8929 Other chronic pain: Secondary | ICD-10-CM | POA: Insufficient documentation

## 2016-08-13 DIAGNOSIS — E559 Vitamin D deficiency, unspecified: Secondary | ICD-10-CM | POA: Diagnosis not present

## 2016-08-13 MED ORDER — ERGOCALCIFEROL 1.25 MG (50000 UT) PO CAPS: 50000.0000 [IU] | ORAL_CAPSULE | ORAL | 0 refills | Status: DC

## 2016-08-13 NOTE — Assessment & Plan Note (Signed)
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 I discussed with her the natural history of MGUS. Her bone pain is not related to this. She would need close follow-up once a year with history, blood work and examination. She agreed

## 2016-08-13 NOTE — Progress Notes (Signed)
[facility] OFFICE PROGRESS NOTE  Patient Care Team: Binnie Rail, MD as PCP - General (Internal Medicine) Hennie Duos, MD as Consulting Physician (Rheumatology)  SUMMARY OF ONCOLOGIC HISTORY:  Alexandra Powers 45 y.o. female is here because of chronic joint pain and abnormal protein electrophoresis. The patient had problems with chronic back pain. She denies prior history of trauma. The pain has been present for over a year. She was prescribed intermittent doses of NSAID and Vicodin for this. The pains is located in the shoulder, the back, and the knees with associated morning stiffness. The patient had cholecystectomy several months ago and since then had postprandial nausea, vomiting usually within 30 minutes and associated diarrhea 4-5 times a day with abdominal cramps. She have lost almost 40 pounds over the past few months. She was subsequently referred to rheumatologist. On 05/20/2016, blood work show high sedimentation rate of 67, C-reactive protein of 11.9, abnormal serum protein electrophoresis with detectable M spike but negative autoimmune screen with ANA. Hepatitis screen was negative. She denies history of abnormal bone fracture. Patient denies history of recurrent infection or atypical infections such as shingles of meningitis. Denies chills, night sweats or anorexia  In August 2017, she underwent extensive workup which excluded multiple myeloma. X-ray is most consistent with osteoarthritis and she have IgG lambda MGUS with polyclonal IgA  INTERVAL HISTORY: Please see below for problem oriented charting. She feels well. She continues to complain of diffuse bone pain She denies recent infection  REVIEW OF SYSTEMS:   Constitutional: Denies fevers, chills or abnormal weight loss Eyes: Denies blurriness of vision Ears, nose, mouth, throat, and face: Denies mucositis or sore throat Respiratory: Denies cough, dyspnea or wheezes Cardiovascular: Denies  palpitation, chest discomfort or lower extremity swelling Gastrointestinal:  Denies nausea, heartburn or change in bowel habits Skin: Denies abnormal skin rashes Lymphatics: Denies new lymphadenopathy or easy bruising Neurological:Denies numbness, tingling or new weaknesses Behavioral/Psych: Mood is stable, no new changes  All other systems were reviewed with the patient and are negative.  I have reviewed the past medical history, past surgical history, social history and family history with the patient and they are unchanged from previous note.  ALLERGIES:  has No Known Allergies.  MEDICATIONS:  Current Outpatient Prescriptions  Medication Sig Dispense Refill  . amLODipine (NORVASC) 10 MG tablet Take 1 tablet (10 mg total) by mouth daily. 90 tablet 3  . hydrochlorothiazide (HYDRODIURIL) 25 MG tablet Take 1 tablet (25 mg total) by mouth daily. 90 tablet 3  . ibuprofen (ADVIL,MOTRIN) 200 MG tablet Take 200 mg by mouth every 6 (six) hours as needed.    Marland Kitchen lisinopril (PRINIVIL,ZESTRIL) 30 MG tablet Take 1 tablet (30 mg total) by mouth daily. 90 tablet 3  . potassium chloride SA (K-DUR,KLOR-CON) 20 MEQ tablet Take 1 tablet (20 mEq total) by mouth daily. 30 tablet 3  . ergocalciferol (VITAMIN D2) 50000 units capsule Take 1 capsule (50,000 Units total) by mouth once a week. 12 capsule 0   No current facility-administered medications for this visit.     PHYSICAL EXAMINATION: ECOG PERFORMANCE STATUS: 1 - Symptomatic but completely ambulatory  Vitals:   08/13/16 0907  BP: 125/82  Pulse: 87  Resp: 18  Temp: 98.5 F (36.9 C)   Filed Weights   08/13/16 0907  Weight: (!) 369 lb 8 oz (167.6 kg)    GENERAL:alert, no distress and comfortable. She is morbidly obese SKIN: skin color, texture, turgor are normal, no rashes or significant lesions  EYES: normal, Conjunctiva are pink and non-injected, sclera clear Musculoskeletal:no cyanosis of digits and no clubbing  NEURO: alert & oriented x 3  with fluent speech, no focal motor/sensory deficits  LABORATORY DATA:  I have reviewed the data as listed    Component Value Date/Time   NA 141 07/13/2016 1138   K 3.7 07/13/2016 1138   CL 104 04/03/2016 1028   CO2 27 07/13/2016 1138   GLUCOSE 100 07/13/2016 1138   BUN 14.7 07/13/2016 1138   CREATININE 1.2 (H) 07/13/2016 1138   CALCIUM 9.6 07/13/2016 1138   PROT 8.1 07/13/2016 1138   PROT 7.3 07/13/2016 1138   ALBUMIN 3.2 (L) 07/13/2016 1138   AST 18 07/13/2016 1138   ALT 10 07/13/2016 1138   ALKPHOS 64 07/13/2016 1138   BILITOT <0.30 07/13/2016 1138   GFRNONAA 60 (L) 12/26/2015 2351   GFRNONAA 83 08/29/2015 1457   GFRAA >60 12/26/2015 2351   GFRAA >89 08/29/2015 1457    No results found for: SPEP, UPEP  Lab Results  Component Value Date   WBC 10.1 07/13/2016   NEUTROABS 4.7 07/13/2016   HGB 11.7 07/13/2016   HCT 35.2 07/13/2016   MCV 97.5 07/13/2016   PLT 328 07/13/2016      Chemistry      Component Value Date/Time   NA 141 07/13/2016 1138   K 3.7 07/13/2016 1138   CL 104 04/03/2016 1028   CO2 27 07/13/2016 1138   BUN 14.7 07/13/2016 1138   CREATININE 1.2 (H) 07/13/2016 1138      Component Value Date/Time   CALCIUM 9.6 07/13/2016 1138   ALKPHOS 64 07/13/2016 1138   AST 18 07/13/2016 1138   ALT 10 07/13/2016 1138   BILITOT <0.30 07/13/2016 1138       RADIOGRAPHIC STUDIES:I reviewed the skeletal survey I have personally reviewed the radiological images as listed and agreed with the findings in the report.    ASSESSMENT & PLAN:  MGUS (monoclonal gammopathy of unknown significance) Her blood work is most consistent with MGUS. I discussed with her the natural history of MGUS. Her bone pain is not related to this. She would need close follow-up once a year with history, blood work and examination. She agreed  Vitamin D deficiency She has severe vitamin D deficiency. I will start her on high-dose replacement therapy and I recommend close follow-up  with primary care doctor in the future  Chronic pain of multiple joints I review her x-ray which is most consistent with osteoarthritis as a cause of her bone pain. Vitamin D deficiency can cause osteomalacia and cause pain as well. I recommend conservative management with a trial of vitamin D replacement therapy first. I also recommend weight loss if possible   Orders Placed This Encounter  Procedures  . Comprehensive metabolic panel    Standing Status:   Future    Standing Expiration Date:   09/17/2017  . CBC with Differential/Platelet    Standing Status:   Future    Standing Expiration Date:   09/17/2017  . Multiple Myeloma Panel (SPEP&IFE w/QIG)    Standing Status:   Future    Standing Expiration Date:   09/17/2017  . Kappa/lambda light chains    Standing Status:   Future    Standing Expiration Date:   09/17/2017   All questions were answered. The patient knows to call the clinic with any problems, questions or concerns. No barriers to learning was detected. I spent 15 minutes counseling the patient face to  face. The total time spent in the appointment was 20 minutes and more than 50% was on counseling and review of test results     [facility], [place], MD 08/13/2016 12:58 PM

## 2016-08-13 NOTE — Assessment & Plan Note (Signed)
I review her x-ray which is most consistent with osteoarthritis as a cause of her bone pain. Vitamin D deficiency can cause osteomalacia and cause pain as well. I recommend conservative management with a trial of vitamin D replacement therapy first. I also recommend weight loss if possible

## 2016-08-13 NOTE — Assessment & Plan Note (Signed)
She has severe vitamin D deficiency. I will start her on high-dose replacement therapy and I recommend close follow-up with primary care doctor in the future

## 2016-08-17 DIAGNOSIS — M2550 Pain in unspecified joint: Secondary | ICD-10-CM | POA: Diagnosis not present

## 2016-08-17 DIAGNOSIS — M150 Primary generalized (osteo)arthritis: Secondary | ICD-10-CM | POA: Diagnosis not present

## 2016-08-17 DIAGNOSIS — R700 Elevated erythrocyte sedimentation rate: Secondary | ICD-10-CM | POA: Diagnosis not present

## 2016-08-17 DIAGNOSIS — M25561 Pain in right knee: Secondary | ICD-10-CM | POA: Diagnosis not present

## 2016-08-17 DIAGNOSIS — M5136 Other intervertebral disc degeneration, lumbar region: Secondary | ICD-10-CM | POA: Diagnosis not present

## 2016-08-17 DIAGNOSIS — M25562 Pain in left knee: Secondary | ICD-10-CM | POA: Diagnosis not present

## 2016-08-21 ENCOUNTER — Encounter: Payer: Self-pay | Admitting: Internal Medicine

## 2016-08-21 ENCOUNTER — Ambulatory Visit (INDEPENDENT_AMBULATORY_CARE_PROVIDER_SITE_OTHER): Payer: BLUE CROSS/BLUE SHIELD | Admitting: Internal Medicine

## 2016-08-21 VITALS — BP 114/84 | HR 100 | Temp 98.0°F | Resp 18 | Ht 68.0 in | Wt 364.0 lb

## 2016-08-21 DIAGNOSIS — G8929 Other chronic pain: Secondary | ICD-10-CM

## 2016-08-21 DIAGNOSIS — E559 Vitamin D deficiency, unspecified: Secondary | ICD-10-CM

## 2016-08-21 DIAGNOSIS — Z23 Encounter for immunization: Secondary | ICD-10-CM

## 2016-08-21 DIAGNOSIS — M545 Low back pain, unspecified: Secondary | ICD-10-CM | POA: Insufficient documentation

## 2016-08-21 DIAGNOSIS — I10 Essential (primary) hypertension: Secondary | ICD-10-CM

## 2016-08-21 DIAGNOSIS — K591 Functional diarrhea: Secondary | ICD-10-CM

## 2016-08-21 DIAGNOSIS — R1110 Vomiting, unspecified: Secondary | ICD-10-CM | POA: Insufficient documentation

## 2016-08-21 DIAGNOSIS — R1111 Vomiting without nausea: Secondary | ICD-10-CM

## 2016-08-21 DIAGNOSIS — M2550 Pain in unspecified joint: Secondary | ICD-10-CM

## 2016-08-21 DIAGNOSIS — R0681 Apnea, not elsewhere classified: Secondary | ICD-10-CM

## 2016-08-21 DIAGNOSIS — M549 Dorsalgia, unspecified: Secondary | ICD-10-CM

## 2016-08-21 DIAGNOSIS — R0683 Snoring: Secondary | ICD-10-CM

## 2016-08-21 DIAGNOSIS — D472 Monoclonal gammopathy: Secondary | ICD-10-CM

## 2016-08-21 MED ORDER — HYOSCYAMINE SULFATE 0.125 MG PO TABS: 0.1250 mg | ORAL_TABLET | ORAL | 5 refills | Status: DC | PRN

## 2016-08-21 MED ORDER — TIZANIDINE HCL 4 MG PO TABS: 4.0000 mg | ORAL_TABLET | Freq: Four times a day (QID) | ORAL | 5 refills | Status: DC | PRN

## 2016-08-21 NOTE — Assessment & Plan Note (Signed)
Frequent, no obvious cause May need to see GI Trial of diet modification

## 2016-08-21 NOTE — Patient Instructions (Addendum)
  All other Health Maintenance issues reviewed.   All recommended immunizations and age-appropriate screenings are up-to-date or discussed.  Flu vaccine administered today.   Medications reviewed and updated.  Changes include a muscle relaxer for your back and an anti-spasm muscle for your intestines.   Your prescription(s) have been submitted to your pharmacy. Please take as directed and contact our office if you believe you are having problem(s) with the medication(s).  A referral was ordered for pulmonary to evaluate you for sleep apnea.   Please followup in 3 months

## 2016-08-21 NOTE — Progress Notes (Signed)
Pre visit review using our clinic review tool, if applicable. No additional management support is needed unless otherwise documented below in the visit note. 

## 2016-08-21 NOTE — Assessment & Plan Note (Signed)
Placed on vitamin D by Dr Alvy Bimler which may be contributing to her pain Will see ortho for evaluation of osteoarthritis

## 2016-08-21 NOTE — Assessment & Plan Note (Signed)
Arthritis with possible muscle spasms  Trial of tizanidine Has an appt to see orthopedics

## 2016-08-21 NOTE — Assessment & Plan Note (Signed)
BP well controlled Current regimen effective and well tolerated Continue current medications at current doses  

## 2016-08-21 NOTE — Progress Notes (Signed)
Subjective:    [name], female    DOB: 1971-01-03, 45 y.o.   MRN: [id number]  HPI The patient is here for follow up.  Hypertension: She is taking her medication daily. She is compliant with a low sodium diet.  She denies chest pain, palpitations, edema, shortness of breath. She is not exercising regularly.  She does not monitor her blood pressure at home.    MGUS:  She is following with oncology and will see Dr Alvy Bimler once a year.  Vitamin D deficiency:  She is taking her vitamin D daily.  She has not noticed any change in her bone pain but has only taken two doses.   Joint pain, back pain:  She will be seeing orthopedics for frurther evaluation of her back and hip pain.  She has sharp pain in her back that stays for a while and then eases up.  She has never had a muscle spasm and is not sure if that is what it is.  The hot water helps.    She has diarrhea since having the GB out, prior to that she did not have any diarrhea.  It is intermittent, but it will come for weeks at a time.  She has abdominal cramping during a bowel movement that resolves shortly after having a bowel movement.  She vomits a few times a week.  She denies nausea.  If vomits in the morning it is sometimes just fluid and sometimes the food from the night prior.    Weight loss:  She is eating less - feels full quickly.  She feels her appetite is normal.  She is losing weight.  She is not exercising.     Medications and allergies reviewed with patient and updated if appropriate.  Patient Active Problem List   Diagnosis Date Noted  . Chronic pain of multiple joints 08/13/2016  . MGUS (monoclonal gammopathy of unknown significance) 07/02/2016  . Vitamin D deficiency 07/02/2016  . Diarrhea 07/02/2016  . Joint pain 04/03/2016  . HTN (hypertension), benign 11/11/2015  . Morbid obesity ([place]) 11/11/2015  . Adrenal mass ([place]) 08/30/2015  . Ovarian cyst 08/30/2015    Current Outpatient Prescriptions on  File Prior to Visit  Medication Sig Dispense Refill  . amLODipine (NORVASC) 10 MG tablet Take 1 tablet (10 mg total) by mouth daily. 90 tablet 3  . ergocalciferol (VITAMIN D2) 50000 units capsule Take 1 capsule (50,000 Units total) by mouth once a week. 12 capsule 0  . hydrochlorothiazide (HYDRODIURIL) 25 MG tablet Take 1 tablet (25 mg total) by mouth daily. 90 tablet 3  . ibuprofen (ADVIL,MOTRIN) 200 MG tablet Take 200 mg by mouth every 6 (six) hours as needed.    Marland Kitchen lisinopril (PRINIVIL,ZESTRIL) 30 MG tablet Take 1 tablet (30 mg total) by mouth daily. 90 tablet 3  . potassium chloride SA (K-DUR,KLOR-CON) 20 MEQ tablet Take 1 tablet (20 mEq total) by mouth daily. 30 tablet 3  . [DISCONTINUED] losartan-hydrochlorothiazide (HYZAAR) 50-12.5 MG per tablet Take 1 tablet by mouth daily. 30 tablet 5   No current facility-administered medications on file prior to visit.     Past Medical History:  Diagnosis Date  . Hypertension   . Obesity     Past Surgical History:  Procedure Laterality Date  . CHOLECYSTECTOMY N/A 12/21/2015   Procedure: LAPAROSCOPIC CHOLECYSTECTOMY;  Surgeon: Georganna Skeans, MD;  Location: [facility];  Service: General;  Laterality: N/A;  . UMBILICAL HERNIA REPAIR N/A 12/27/2015   Procedure:  INCARCERATED UMBILICAL HERNIA REPAIR;  Surgeon: Coralie Keens, MD;  Location: [facility];  Service: General;  Laterality: N/A;    Social History   Social History  . Marital status: Married    Spouse name: N/A  . Number of children: 2  . Years of education: N/A   Occupational History  .      unemploed   Social History Main Topics  . Smoking status: Current Every Day Smoker    Packs/day: 0.50    Years: 24.00    Types: Cigarettes  . Smokeless tobacco: Never Used     Comment: plans on quitting new years  . Alcohol use 2.4 oz/week    4 Shots of liquor per week     Comment: sometimes  . Drug use:     Types: Marijuana  . Sexual activity: Not on file   Other Topics Concern  . Not on  file   Social History Narrative   Lives with husband, Berneta Sages.    Family History  Problem Relation Age of Onset  . Arthritis Mother   . Cancer Paternal Aunt     breast ca  . Adrenal disorder Neg Hx     Review of Systems  Constitutional: Positive for unexpected weight change (weight loss, eating less, feels full quicker). Negative for appetite change (appetite normal), chills and fever.  Respiratory: Positive for apnea and cough. Negative for shortness of breath and wheezing.        Snores  Cardiovascular: Negative for chest pain, palpitations and leg swelling.  Gastrointestinal: Positive for abdominal pain (cramping during a bowel, resolves after bowel movement) and diarrhea. Negative for blood in stool.       Jerrye Bushy 1/month  Neurological: Positive for light-headedness (occasional) and headaches.       Objective:   Vitals:   08/21/16 1139  BP: 114/84  Pulse: 100  Resp: 18  Temp: 98 F (36.7 C)   Filed Weights   08/21/16 1139  Weight: (!) 364 lb (165.1 kg)   Body mass index is 55.35 kg/m.   Physical Exam    Constitutional: Appears well-developed and well-nourished. No distress.  HENT:  Head: Normocephalic and atraumatic.  Neck: Neck supple. No tracheal deviation present. No thyromegaly present.  Cardiovascular: Normal rate, regular rhythm and normal heart sounds.   No murmur heard. No carotid bruit  Pulmonary/Chest: Effort normal and breath sounds normal. No respiratory distress. No has no wheezes. No rales.  Musculoskeletal: No edema.  Lymphadenopathy: No cervical adenopathy.  Skin: Skin is warm and dry. Not diaphoretic.  Psychiatric: Normal mood and affect. Behavior is normal.     Assessment & Plan:   Flu vaccine   See Problem List for Assessment and Plan of chronic medical problems.    F/u in 3 months

## 2016-08-21 NOTE — Assessment & Plan Note (Signed)
It started after: B her GB  was removed Stressed dietary modification Has lower abd cramping - trial of hyoscamine May need to see GI

## 2016-08-27 ENCOUNTER — Telehealth: Payer: Self-pay | Admitting: Hematology and Oncology

## 2016-08-27 NOTE — Telephone Encounter (Signed)
Spoke with patient re sept 2018 appointments

## 2016-09-22 ENCOUNTER — Other Ambulatory Visit: Payer: Self-pay | Admitting: *Deleted

## 2016-09-22 MED ORDER — AMLODIPINE BESYLATE 10 MG PO TABS: 10.0000 mg | ORAL_TABLET | Freq: Every day | ORAL | 3 refills | Status: DC

## 2016-09-22 NOTE — Telephone Encounter (Signed)
Rec'd call pt states her BP med has expired, and needing rx sent to pharmacy. Verified which med she is needing inform will send amlodipine to [facility].Rx has been sent electronically....Alexandra Powers

## 2016-09-23 DIAGNOSIS — M47817 Spondylosis without myelopathy or radiculopathy, lumbosacral region: Secondary | ICD-10-CM | POA: Diagnosis not present

## 2016-09-23 DIAGNOSIS — Z6841 Body Mass Index (BMI) 40.0 and over, adult: Secondary | ICD-10-CM | POA: Diagnosis not present

## 2016-10-13 ENCOUNTER — Telehealth: Payer: Self-pay | Admitting: Internal Medicine

## 2016-10-13 MED ORDER — METHOCARBAMOL 500 MG PO TABS: 500.0000 mg | ORAL_TABLET | Freq: Four times a day (QID) | ORAL | 1 refills | Status: DC | PRN

## 2016-10-13 NOTE — Telephone Encounter (Signed)
Please advise 

## 2016-10-13 NOTE — Telephone Encounter (Signed)
Patient called and said the muscle relaxers are not working. She has been on them for a month. She would like to try something stronger. Please follow up with patient. Thank you.

## 2016-10-13 NOTE — Telephone Encounter (Signed)
I sent a different muscle relaxer to her pof - if it does not help she should follow up in the office.

## 2016-10-14 NOTE — Telephone Encounter (Signed)
Called patient and informed her that she had a new rx ready to be picked up. As well if she is not better she would need to make an appointment.

## 2016-10-14 NOTE — Telephone Encounter (Signed)
Spoke with pt to inform.  

## 2016-10-15 DIAGNOSIS — M47817 Spondylosis without myelopathy or radiculopathy, lumbosacral region: Secondary | ICD-10-CM | POA: Diagnosis not present

## 2016-10-20 ENCOUNTER — Telehealth: Payer: Self-pay | Admitting: Internal Medicine

## 2016-10-20 DIAGNOSIS — R700 Elevated erythrocyte sedimentation rate: Secondary | ICD-10-CM | POA: Diagnosis not present

## 2016-10-20 DIAGNOSIS — M5136 Other intervertebral disc degeneration, lumbar region: Secondary | ICD-10-CM | POA: Diagnosis not present

## 2016-10-20 DIAGNOSIS — M25561 Pain in right knee: Secondary | ICD-10-CM | POA: Diagnosis not present

## 2016-10-20 DIAGNOSIS — R197 Diarrhea, unspecified: Secondary | ICD-10-CM

## 2016-10-20 DIAGNOSIS — M150 Primary generalized (osteo)arthritis: Secondary | ICD-10-CM | POA: Diagnosis not present

## 2016-10-20 DIAGNOSIS — M25562 Pain in left knee: Secondary | ICD-10-CM | POA: Diagnosis not present

## 2016-10-20 DIAGNOSIS — M2550 Pain in unspecified joint: Secondary | ICD-10-CM | POA: Diagnosis not present

## 2016-10-20 NOTE — Telephone Encounter (Signed)
Patient states she was told to call back once ready for referral to GI.  Patient is ready.

## 2016-10-20 NOTE — Telephone Encounter (Signed)
Please advise 

## 2016-10-21 NOTE — Telephone Encounter (Signed)
Referral ordered

## 2016-10-21 NOTE — Telephone Encounter (Signed)
Spoke with pt to inform.  

## 2016-10-29 ENCOUNTER — Encounter: Payer: Self-pay | Admitting: Pulmonary Disease

## 2016-10-29 ENCOUNTER — Ambulatory Visit (INDEPENDENT_AMBULATORY_CARE_PROVIDER_SITE_OTHER): Payer: BLUE CROSS/BLUE SHIELD | Admitting: Pulmonary Disease

## 2016-10-29 DIAGNOSIS — G4710 Hypersomnia, unspecified: Secondary | ICD-10-CM | POA: Diagnosis not present

## 2016-10-29 NOTE — Patient Instructions (Signed)

## 2016-10-29 NOTE — Progress Notes (Signed)
Subjective:    [name], female    DOB: [birth year], 45 y.o.   MRN: [id number]  HPI   This is the case of Alexandra Powers, 45 y.o. Female, who was referred by Dr. Billey Gosling  in consultation regarding possible OSA.   As you very well know, patient has a 15 PY smoking history, currently smoking 1/2 PPD, not been diagnosed with asthm or copd.   Patient has snoring, witnessed apneas, gasping, choking, frequent awakenings.  Sleeps 7 hrs per night.  Wakes up unrefreshed.  Has daytime naps. She moves a lot in her sleep. (-) abnormal behavior in sleep.  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 ESS 12.     Review of Systems  Constitutional: Positive for unexpected weight change. Negative for fever.  HENT: Negative.  Negative for congestion, dental problem, ear pain, nosebleeds, postnasal drip, rhinorrhea, sinus pressure, sneezing, sore throat and trouble swallowing.   Eyes: Negative.  Negative for redness and itching.  Respiratory: Positive for shortness of breath. Negative for cough, chest tightness and wheezing.   Cardiovascular: Positive for leg swelling. Negative for palpitations.  Gastrointestinal: Positive for nausea. Negative for vomiting.  Endocrine: Negative.   Genitourinary: Negative.  Negative for dysuria.  Musculoskeletal: Positive for joint swelling.  Skin: Negative.  Negative for rash.  Allergic/Immunologic: Negative.   Neurological: Positive for dizziness and headaches.  Hematological: Bruises/bleeds easily.  Psychiatric/Behavioral: Negative.  Negative for dysphoric mood. The patient is not nervous/anxious.    Past Medical History:  Diagnosis Date  . Hypertension   . Obesity    (-) CA. Has MGUS.  (-) DVT.   Family History  Problem Relation Age of Onset  . Arthritis Mother   . Cancer Paternal Aunt     breast ca  . Adrenal disorder Neg Hx      Past Surgical History:  Procedure Laterality Date  . CHOLECYSTECTOMY N/A 12/21/2015   Procedure: LAPAROSCOPIC  CHOLECYSTECTOMY;  Surgeon: Georganna Skeans, MD;  Location: [facility];  Service: General;  Laterality: N/A;  . UMBILICAL HERNIA REPAIR N/A 12/27/2015   Procedure: INCARCERATED UMBILICAL HERNIA REPAIR;  Surgeon: Coralie Keens, MD;  Location: [facility];  Service: General;  Laterality: N/A;    Social History   Social History  . Marital status: Married    Spouse name: N/A  . Number of children: 2  . Years of education: N/A   Occupational History  .      unemploed   Social History Main Topics  . Smoking status: Current Every Day Smoker    Packs/day: 0.50    Years: 24.00    Types: Cigarettes  . Smokeless tobacco: Never Used     Comment: plans on quitting new years  . Alcohol use 2.4 oz/week    4 Shots of liquor per week     Comment: sometimes  . Drug use:     Types: Marijuana  . Sexual activity: Not on file   Other Topics Concern  . Not on file   Social History Narrative   Lives with husband, Berneta Sages.   Married with 2 children, (-) smoking, occasional ETOH.   No Known Allergies   Outpatient Medications Prior to Visit  Medication Sig Dispense Refill  . amLODipine (NORVASC) 10 MG tablet Take 1 tablet (10 mg total) by mouth daily. 90 tablet 3  . ergocalciferol (VITAMIN D2) 50000 units capsule Take 1 capsule (50,000 Units total) by mouth once a week. 12 capsule 0  . hydrochlorothiazide (HYDRODIURIL) 25 MG tablet  Take 1 tablet (25 mg total) by mouth daily. 90 tablet 3  . hyoscyamine (LEVSIN, ANASPAZ) 0.125 MG tablet Take 1 tablet (0.125 mg total) by mouth every 4 (four) hours as needed for cramping. 90 tablet 5  . ibuprofen (ADVIL,MOTRIN) 200 MG tablet Take 200 mg by mouth every 6 (six) hours as needed.    Marland Kitchen lisinopril (PRINIVIL,ZESTRIL) 30 MG tablet Take 1 tablet (30 mg total) by mouth daily. 90 tablet 3  . methocarbamol (ROBAXIN) 500 MG tablet Take 1 tablet (500 mg total) by mouth every 6 (six) hours as needed for muscle spasms. 90 tablet 1  . potassium chloride SA (K-DUR,KLOR-CON) 20  MEQ tablet Take 1 tablet (20 mEq total) by mouth daily. 30 tablet 3   No facility-administered medications prior to visit.    No orders of the defined types were placed in this encounter.        Objective:   Physical Exam  Vitals:  Vitals:   10/29/16 0954  BP: 114/78  Pulse: (!) 107  SpO2: 99%  Weight: (!) 362 lb 9.6 oz (164.5 kg)  Height: 5\' 8"  (1.727 m)    Constitutional/General:  Pleasant, well-nourished, well-developed, not in any distress,  Comfortably seating.  Well kempt  Body mass index is 55.13 kg/m. Wt Readings from Last 3 Encounters:  10/29/16 (!) 362 lb 9.6 oz (164.5 kg)  08/21/16 (!) 364 lb (165.1 kg)  08/13/16 (!) 369 lb 8 oz (167.6 kg)     HEENT: Pupils equal and reactive to light and accommodation. Anicteric sclerae. Normal nasal mucosa.   No oral  lesions,  mouth clear,  oropharynx clear, no postnasal drip. (-) Oral thrush. No dental caries.  Airway - Mallampati class III  Neck: No masses. Midline trachea. No JVD, (-) LAD. (-) bruits appreciated.  Respiratory/Chest: Grossly normal chest. (-) deformity. (-) Accessory muscle use.  Symmetric expansion. (-) Tenderness on palpation.  Resonant on percussion.  Diminished BS on both lower lung zones. (-) wheezing, crackles, rhonchi (-) egophony  Cardiovascular: Regular rate and  rhythm, heart sounds normal, no murmur or gallops, no peripheral edema  Gastrointestinal:  Normal bowel sounds. Soft, non-tender. No hepatosplenomegaly.  (-) masses.   Musculoskeletal:  Normal muscle tone. Normal gait.   Extremities: Grossly normal. (-) clubbing, cyanosis.  (-) edema  Skin: (-) rash,lesions seen.   Neurological/Psychiatric : alert, oriented to time, place, person. Normal mood and affect          Assessment & Plan:  Hypersomnia Patient has snoring, witnessed apneas, gasping, choking, frequent awakenings.  Sleeps 7 hrs per night.  Wakes up unrefreshed.  Has daytime naps. She moves a lot in her  sleep. (-) abnormal behavior in sleep.  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 ESS 12.   Pt with HTN several yrs now.   Plan :  We discussed about the diagnosis of Obstructive Sleep Apnea (OSA) and implications of untreated OSA. We discussed about CPAP and BiPaP as possible treatment options.    We will schedule the patient for a sleep study. I wanted to get a lab study but she said she might have logistics issues. We'll try to get home sleep test. Her cousin has sleep apnea and is on CPAP. Likely with moderate. Anticipate no issues with CPAP.   Patient was instructed to call the office if he/she has not heard back from the office 1-2 weeks after the sleep study.   Patient was instructed to call the office if he/she is having issues with the  PAP device.   We discussed good sleep hygiene.   Patient was advised not to engage in activities requiring concentration and/or vigilance if he/she is sleepy.  Patient was advised not to drive if he/she is sleepy.     Morbid obesity ([place]) Weight reduction     Thank you very much for letting me participate in this patient's care. Please do not hesitate to give me a call if you have any questions or concerns regarding the treatment plan.   Patient will follow up with me in 6-8 weeks.     Monica Becton, MD 10/29/2016   10:37 AM Pulmonary and [place] Pager: [phone] Office: [phone], Fax: [phone]

## 2016-10-29 NOTE — Assessment & Plan Note (Signed)
Weight reduction 

## 2016-10-29 NOTE — Assessment & Plan Note (Signed)
Patient has snoring, witnessed apneas, gasping, choking, frequent awakenings.  Sleeps 7 hrs per night.  Wakes up unrefreshed.  Has daytime naps. She moves a lot in her sleep. (-) abnormal behavior in sleep.  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 ESS 12.   Pt with HTN several yrs now.   Plan :  We discussed about the diagnosis of Obstructive Sleep Apnea (OSA) and implications of untreated OSA. We discussed about CPAP and BiPaP as possible treatment options.    We will schedule the patient for a sleep study. I wanted to get a lab study but she said she might have logistics issues. We'll try to get home sleep test. Her cousin has sleep apnea and is on CPAP. Likely with moderate. Anticipate no issues with CPAP.   Patient was instructed to call the office if he/she has not heard back from the office 1-2 weeks after the sleep study.   Patient was instructed to call the office if he/she is having issues with the PAP device.   We discussed good sleep hygiene.   Patient was advised not to engage in activities requiring concentration and/or vigilance if he/she is sleepy.  Patient was advised not to drive if he/she is sleepy.

## 2016-10-30 ENCOUNTER — Encounter: Payer: Self-pay | Admitting: Internal Medicine

## 2016-11-12 DIAGNOSIS — M47817 Spondylosis without myelopathy or radiculopathy, lumbosacral region: Secondary | ICD-10-CM | POA: Diagnosis not present

## 2016-11-25 ENCOUNTER — Ambulatory Visit: Payer: BLUE CROSS/BLUE SHIELD | Admitting: Internal Medicine

## 2016-11-26 DIAGNOSIS — G4733 Obstructive sleep apnea (adult) (pediatric): Secondary | ICD-10-CM | POA: Diagnosis not present

## 2016-11-27 ENCOUNTER — Telehealth: Payer: Self-pay | Admitting: Pulmonary Disease

## 2016-11-27 DIAGNOSIS — G4733 Obstructive sleep apnea (adult) (pediatric): Secondary | ICD-10-CM

## 2016-11-27 NOTE — Telephone Encounter (Signed)
  Please call the pt and tell the pt the [place]  showed OSA   Pt stops breathing  33  times an hour.   Home sleep study was done on : 11/27/16  Please order autoCPAP 5-15 cm H2O. Patient will need a mask fitting session. Patient will need a 1 month download.   Patient needs to be seen by me or any of the NPs/APPs  4-6 weeks after obtaining the cpap machine. Let me know if you receive this.   Thanks!   J. Shirl Harris, MD 11/27/2016, 5:28 PM

## 2016-12-01 NOTE — Telephone Encounter (Signed)
Spoke with pt. And made her aware of AD message and recc. Pt. Understood and has agreed to the order being placed for cpap. The order was placed. Pt. Is aware to call us once she has received her machine to call us and get a follow up appointment. Nothing further is needed at this time.

## 2016-12-04 ENCOUNTER — Other Ambulatory Visit (INDEPENDENT_AMBULATORY_CARE_PROVIDER_SITE_OTHER): Payer: BLUE CROSS/BLUE SHIELD

## 2016-12-04 ENCOUNTER — Encounter: Payer: Self-pay | Admitting: Internal Medicine

## 2016-12-04 ENCOUNTER — Ambulatory Visit (INDEPENDENT_AMBULATORY_CARE_PROVIDER_SITE_OTHER): Payer: BLUE CROSS/BLUE SHIELD | Admitting: Internal Medicine

## 2016-12-04 VITALS — BP 124/84 | HR 114 | Temp 98.7°F | Resp 18 | Wt 361.0 lb

## 2016-12-04 DIAGNOSIS — I10 Essential (primary) hypertension: Secondary | ICD-10-CM | POA: Diagnosis not present

## 2016-12-04 DIAGNOSIS — K219 Gastro-esophageal reflux disease without esophagitis: Secondary | ICD-10-CM | POA: Diagnosis not present

## 2016-12-04 DIAGNOSIS — G8929 Other chronic pain: Secondary | ICD-10-CM

## 2016-12-04 DIAGNOSIS — M545 Low back pain: Secondary | ICD-10-CM | POA: Diagnosis not present

## 2016-12-04 LAB — COMPREHENSIVE METABOLIC PANEL
ALBUMIN: 3.8 g/dL (ref 3.5–5.2)
ALK PHOS: 68 U/L (ref 39–117)
ALT: 9 U/L (ref 0–35)
AST: 13 U/L (ref 0–37)
BILIRUBIN TOTAL: 0.4 mg/dL (ref 0.2–1.2)
BUN: 17 mg/dL (ref 6–23)
CO2: 29 mEq/L (ref 19–32)
Calcium: 9.5 mg/dL (ref 8.4–10.5)
Chloride: 103 mEq/L (ref 96–112)
Creatinine, Ser: 1.21 mg/dL — ABNORMAL HIGH (ref 0.40–1.20)
GFR: 61.47 mL/min (ref >60.00)
Glucose, Bld: 104 mg/dL — ABNORMAL HIGH (ref 70–99)
POTASSIUM: 3.6 meq/L (ref 3.5–5.1)
Sodium: 140 mEq/L (ref 135–145)
TOTAL PROTEIN: 8.4 g/dL — AB (ref 6.0–8.3)

## 2016-12-04 MED ORDER — POTASSIUM CHLORIDE CRYS ER 20 MEQ PO TBCR: 20.0000 meq | EXTENDED_RELEASE_TABLET | Freq: Every day | ORAL | 3 refills | Status: DC

## 2016-12-04 MED ORDER — RANITIDINE HCL 150 MG PO TABS: 150.0000 mg | ORAL_TABLET | Freq: Two times a day (BID) | ORAL | 5 refills | Status: DC

## 2016-12-04 MED ORDER — TIZANIDINE HCL 6 MG PO CAPS: 6.0000 mg | ORAL_CAPSULE | Freq: Three times a day (TID) | ORAL | 5 refills | Status: DC | PRN

## 2016-12-04 MED ORDER — ERGOCALCIFEROL 1.25 MG (50000 UT) PO CAPS: 50000.0000 [IU] | ORAL_CAPSULE | ORAL | 0 refills | Status: DC

## 2016-12-04 NOTE — Progress Notes (Signed)
Subjective:    [name], female    DOB: [birth year], 46 y.o.   MRN: [id number]  HPI The patient is here for follow up.  Hypertension: She is taking her medication daily. She is compliant with a low sodium diet.  She denies chest pain, palpitations, edema, shortness of breath and regular headaches. She is not exercising regularly.  She does not monitor her blood pressure at home.    GERD:    She has GERD symptoms frequently.  She does not take any medication for her symptoms.  She is not taking Ibuprofen because it did not help. She denies the night, eating excessively spicy foods.  Back pain, chronic:  She is following Dr Maryjean Ka from neurosurgery. She is having injections.  She was taking tizanidine and then called her medication switch. We switched her to methocarbamol, but she states it did not work as well. She states she actually wanted an increase in the tizanidine dose. She felt that medication works well. She does continue to have back spasms.  Medications and allergies reviewed with patient and updated if appropriate.  Patient Active Problem List   Diagnosis Date Noted  . GERD (gastroesophageal reflux disease) 12/04/2016  . Hypersomnia 10/29/2016  . Back pain 08/21/2016  . Vomiting 08/21/2016  . Chronic pain of multiple joints 08/13/2016  . MGUS (monoclonal gammopathy of unknown significance) 07/02/2016  . Vitamin D deficiency 07/02/2016  . Diarrhea 07/02/2016  . Joint pain 04/03/2016  . HTN (hypertension), benign 11/11/2015  . Morbid obesity ([place]) 11/11/2015  . Adrenal mass ([place]) 08/30/2015  . Ovarian cyst 08/30/2015    Current Outpatient Prescriptions on File Prior to Visit  Medication Sig Dispense Refill  . amLODipine (NORVASC) 10 MG tablet Take 1 tablet (10 mg total) by mouth daily. 90 tablet 3  . hydrochlorothiazide (HYDRODIURIL) 25 MG tablet Take 1 tablet (25 mg total) by mouth daily. 90 tablet 3  . hyoscyamine (LEVSIN, ANASPAZ) 0.125 MG tablet Take  1 tablet (0.125 mg total) by mouth every 4 (four) hours as needed for cramping. 90 tablet 5  . lisinopril (PRINIVIL,ZESTRIL) 30 MG tablet Take 1 tablet (30 mg total) by mouth daily. 90 tablet 3  . [DISCONTINUED] losartan-hydrochlorothiazide (HYZAAR) 50-12.5 MG per tablet Take 1 tablet by mouth daily. 30 tablet 5   No current facility-administered medications on file prior to visit.     Past Medical History:  Diagnosis Date  . Hypertension   . Obesity     Past Surgical History:  Procedure Laterality Date  . CHOLECYSTECTOMY N/A 12/21/2015   Procedure: LAPAROSCOPIC CHOLECYSTECTOMY;  Surgeon: Georganna Skeans, MD;  Location: [facility];  Service: General;  Laterality: N/A;  . UMBILICAL HERNIA REPAIR N/A 12/27/2015   Procedure: INCARCERATED UMBILICAL HERNIA REPAIR;  Surgeon: Coralie Keens, MD;  Location: [place];  Service: General;  Laterality: N/A;    Social History   Social History  . Marital status: Married    Spouse name: N/A  . Number of children: 2  . Years of education: N/A   Occupational History  .      unemploed   Social History Main Topics  . Smoking status: Current Every Day Smoker    Packs/day: 0.50    Years: 24.00    Types: Cigarettes  . Smokeless tobacco: Never Used     Comment: plans on quitting new years  . Alcohol use 2.4 oz/week    4 Shots of liquor per week     Comment: sometimes  .  Drug use:     Types: Marijuana  . Sexual activity: Not Asked   Other Topics Concern  . None   Social History Narrative   Lives with husband, Berneta Sages.    Family History  Problem Relation Age of Onset  . Arthritis Mother   . Cancer Paternal Aunt     breast ca  . Adrenal disorder Neg Hx     Review of Systems  Constitutional: Negative for fever.  Respiratory: Positive for cough (chronic). Negative for shortness of breath and wheezing.   Cardiovascular: Negative for chest pain, palpitations and leg swelling.  Gastrointestinal: Positive for abdominal pain (upper abdomen).         Gerd frequently  Musculoskeletal: Positive for arthralgias and back pain.  Neurological: Negative for light-headedness and headaches.       Objective:   Vitals:   12/04/16 1529  BP: 124/84  Pulse: (!) 114  Resp: 18  Temp: 98.7 F (37.1 C)   Wt Readings from Last 3 Encounters:  12/04/16 (!) 361 lb (163.7 kg)  10/29/16 (!) 362 lb 9.6 oz (164.5 kg)  08/21/16 (!) 364 lb (165.1 kg)   Body mass index is 54.89 kg/m.   Physical Exam    Constitutional: Appears well-developed and well-nourished. No distress.  HENT:  Head: Normocephalic and atraumatic.  Neck: Neck supple. No tracheal deviation present. No thyromegaly present.  No cervical lymphadenopathy Cardiovascular: Normal rate, regular rhythm and normal heart sounds.   No murmur heard. No carotid bruit .  Trace edema Pulmonary/Chest: Effort normal and breath sounds normal. No respiratory distress. No has no wheezes. No rales.  Skin: Skin is warm and dry. Not diaphoretic.  Psychiatric: Normal mood and affect. Behavior is normal.      Assessment & Plan:    See Problem List for Assessment and Plan of chronic medical problems.

## 2016-12-04 NOTE — Assessment & Plan Note (Addendum)
Frequent GERD Advised weight loss Avoiding nsaids Avoiding laying down too soon after eating Start zantac 150 mg BID Has GI appointment coming up-can follow-up with them

## 2016-12-04 NOTE — Patient Instructions (Addendum)
  Test(s) ordered today. Your results will be released to [place] (or called to you) after review, usually within 72hours after test completion. If any changes need to be made, you will be notified at that same time.  All other Health Maintenance issues reviewed.   All recommended immunizations and age-appropriate screenings are up-to-date or discussed.  No immunizations administered today.   Medications reviewed and updated.  Start zantac 150 mg twice daily.  Tizanidine sent to the pharmacy.    Your prescription(s) have been submitted to your pharmacy. Please take as directed and contact our office if you believe you are having problem(s) with the medication(s).   Please followup in 6 months

## 2016-12-04 NOTE — Progress Notes (Signed)
Pre visit review using our clinic review tool, if applicable. No additional management support is needed unless otherwise documented below in the visit note. 

## 2016-12-04 NOTE — Assessment & Plan Note (Signed)
BP well controlled Current regimen effective and well tolerated Continue current medications at current doses  

## 2016-12-04 NOTE — Assessment & Plan Note (Addendum)
Has back spasms - wants to go back to tizanidine - it worked better than methocarbamol Start tizanidine 6 mg TID prn, stop methocarbamol  Following with neurosurgery-receiving injections

## 2016-12-07 DIAGNOSIS — G4733 Obstructive sleep apnea (adult) (pediatric): Secondary | ICD-10-CM | POA: Diagnosis not present

## 2016-12-08 ENCOUNTER — Other Ambulatory Visit: Payer: Self-pay | Admitting: *Deleted

## 2016-12-08 DIAGNOSIS — G4710 Hypersomnia, unspecified: Secondary | ICD-10-CM

## 2016-12-10 ENCOUNTER — Encounter: Payer: Self-pay | Admitting: Acute Care

## 2016-12-10 ENCOUNTER — Ambulatory Visit (INDEPENDENT_AMBULATORY_CARE_PROVIDER_SITE_OTHER): Payer: BLUE CROSS/BLUE SHIELD | Admitting: Acute Care

## 2016-12-10 DIAGNOSIS — G4733 Obstructive sleep apnea (adult) (pediatric): Secondary | ICD-10-CM

## 2016-12-10 DIAGNOSIS — F17210 Nicotine dependence, cigarettes, uncomplicated: Secondary | ICD-10-CM | POA: Diagnosis not present

## 2016-12-10 NOTE — Assessment & Plan Note (Addendum)
Initiation of Therapy, awaiting delivery of device. Plan: Your DME for your CPAP device is [facility].  The order was received by them 12/01/2016. We have given you the contact number to call to arrange for obtaining your device. Goal is to wear for at least 6 hours each night for maximal clinical benefit. Continue to work on weight loss, as the link between excess weight  and sleep apnea is well established.  Do not drive if sleepy. Remember to clean your machine with soap and water every week. ( Tubing , mask and reservoir) Reviewed health risks associated with untreated OSA. Follow up in  8  weeks with Alexandra Form, NP or before as needed.  We will get a download at that visit. Please contact office for sooner follow up if symptoms do not improve or worsen or seek emergency care

## 2016-12-10 NOTE — Patient Instructions (Addendum)
It is nice to meet you today. Your DME for your CPAP device is [facility].  The order was received by them 12/01/2016. We will give you the contact number to call to arrange for obtaining your device. Goal is to wear for at least 6 hours each night for maximal clinical benefit. Continue to work on weight loss, as the link between excess weight  and sleep apnea is well established.  Do not drive if sleepy. Remember to clean your machine with soap and water every week. ( Tubing , mask and reservoir) Follow up in  8  weeks with Eric Form, NP or before as needed.  We will get a download at that visit. Please contact office for sooner follow up if symptoms do not improve or worsen or seek emergency care

## 2016-12-10 NOTE — Progress Notes (Signed)
History of Present Illness Alexandra Powers is a 46 y.o. female current  1/2 pack day smoker with OSA on CPAP therapy ( initiated 2017). She is followed by Dr. Corrie Dandy.   12/10/2016 6-8 week follow up after initiation of therapy. Pt. Presents today for follow up of initiation of CPAP Therapy. Home sleep study done 11/27/2016  showed OSA.  AHI was 33 /Hr. Auto CPAP 5-15 cm H2O was initiated.She is here for download and evaluation of CPAP tolerance.She states she got a call stating she should be receiving a CPAP. She has just never heard back from the office. She has not had a fitting or started any of the process to obtain a device.She has continued to have daytime sleepiness requiring daytime naps. When we inquired about why the patient had not received her device, [facility] noted they received the order 12/01/2016. It may take a  few weeks to process the patient's insurance. We have provided the patient with the contact information for the company and have requested that she call and arrange for the machine.  Tests Home Sleep Test 11/26/2016 AHI= 33/hr ( 18 obstructive/ 19 Central) Lowest SaO2 = 81% Average SAO2= 94% Recommendation for starting CPAP is Auto CPAP 5-15 cm H2O   Past medical hx Past Medical History:  Diagnosis Date  . Hypertension   . Obesity      Past surgical hx, Family hx, Social hx all reviewed.  Current Outpatient Prescriptions on File Prior to Visit  Medication Sig  . amLODipine (NORVASC) 10 MG tablet Take 1 tablet (10 mg total) by mouth daily.  . ergocalciferol (VITAMIN D2) 50000 units capsule Take 1 capsule (50,000 Units total) by mouth once a week.  . hydrochlorothiazide (HYDRODIURIL) 25 MG tablet Take 1 tablet (25 mg total) by mouth daily.  . hyoscyamine (LEVSIN, ANASPAZ) 0.125 MG tablet Take 1 tablet (0.125 mg total) by mouth every 4 (four) hours as needed for cramping.  Marland Kitchen lisinopril (PRINIVIL,ZESTRIL) 30 MG tablet Take 1 tablet (30 mg total) by mouth daily.  .  potassium chloride SA (K-DUR,KLOR-CON) 20 MEQ tablet Take 1 tablet (20 mEq total) by mouth daily.  . ranitidine (ZANTAC) 150 MG tablet Take 1 tablet (150 mg total) by mouth 2 (two) times daily.  . tizanidine (ZANAFLEX) 6 MG capsule Take 1 capsule (6 mg total) by mouth 3 (three) times daily as needed for muscle spasms.  . [DISCONTINUED] losartan-hydrochlorothiazide (HYZAAR) 50-12.5 MG per tablet Take 1 tablet by mouth daily.   No current facility-administered medications on file prior to visit.      No Known Allergies  Review Of Systems:  Constitutional:   No  weight loss, night sweats,  Fevers, chills, fatigue, or  lassitude.  HEENT:   No headaches,  Difficulty swallowing,  Tooth/dental problems, or  Sore throat,                No sneezing, itching, ear ache, nasal congestion, post nasal drip,   CV:  No chest pain,  Orthopnea, PND, swelling in lower extremities, anasarca, dizziness, palpitations, syncope.   GI  No heartburn, indigestion, abdominal pain, nausea, vomiting, diarrhea, change in bowel habits, loss of appetite, bloody stools.   Resp: No shortness of breath with exertion or at rest.  No excess mucus, no productive cough,  No non-productive cough,  No coughing up of blood.  No change in color of mucus.  No wheezing.  No chest wall deformity  Skin: no rash or lesions.  GU: no dysuria,  change in color of urine, no urgency or frequency.  No flank pain, no hematuria   MS:  No joint pain or swelling.  No decreased range of motion.  No back pain.  Psych:  No change in mood or affect. No depression or anxiety.  No memory loss.   Vital Signs BP 114/80 (BP Location: Left Arm, Cuff Size: Large)   Pulse 96   Temp 98.3 F (36.8 C) (Oral)   Ht 5\' 8"  (1.727 m)   Wt (!) 355 lb (161 kg)   SpO2 98%   BMI 53.98 kg/m    Physical Exam:  General- No distress,  A&Ox3 ENT: No sinus tenderness, TM clear, pale nasal mucosa, no oral exudate,no post nasal drip, no LAN Cardiac: S1, S2,  regular rate and rhythm, no murmur Chest: No wheeze/ rales/ dullness; no accessory muscle use, no nasal flaring, no sternal retractions Abd.: Soft Non-tender Ext: No clubbing cyanosis, edema Neuro:  normal strength Skin: No rashes, warm and dry Psych: normal mood and behavior   Assessment/Plan  OSA (obstructive sleep apnea) Initiation of Therapy, awaiting delivery of device. Plan: Your DME for your CPAP device is [facility].  The order was received by them 12/01/2016. We have given you the contact number to call to arrange for obtaining your device. Goal is to wear for at least 6 hours each night for maximal clinical benefit. Continue to work on weight loss, as the link between excess weight  and sleep apnea is well established.  Do not drive if sleepy. Remember to clean your machine with soap and water every week. ( Tubing , mask and reservoir) Reviewed health risks associated with untreated OSA. Follow up in  8  weeks with Eric Form, NP or before as needed.  We will get a download at that visit. Please contact office for sooner follow up if symptoms do not improve or worsen or seek emergency care       Magdalen Spatz, NP 12/10/2016  1:11 PM

## 2016-12-15 ENCOUNTER — Encounter: Payer: Self-pay | Admitting: Internal Medicine

## 2016-12-15 ENCOUNTER — Ambulatory Visit (INDEPENDENT_AMBULATORY_CARE_PROVIDER_SITE_OTHER): Payer: BLUE CROSS/BLUE SHIELD | Admitting: Internal Medicine

## 2016-12-15 VITALS — BP 112/68 | HR 88 | Ht 68.0 in | Wt 358.4 lb

## 2016-12-15 DIAGNOSIS — R112 Nausea with vomiting, unspecified: Secondary | ICD-10-CM

## 2016-12-15 DIAGNOSIS — K3184 Gastroparesis: Secondary | ICD-10-CM

## 2016-12-15 DIAGNOSIS — K219 Gastro-esophageal reflux disease without esophagitis: Secondary | ICD-10-CM | POA: Diagnosis not present

## 2016-12-15 DIAGNOSIS — R197 Diarrhea, unspecified: Secondary | ICD-10-CM

## 2016-12-15 MED ORDER — COLESTIPOL HCL 1 G PO TABS
1.0000 g | ORAL_TABLET | Freq: Two times a day (BID) | ORAL | 3 refills | Status: DC
Start: 2016-12-15 — End: 2017-03-29

## 2016-12-15 NOTE — Patient Instructions (Addendum)
We have sent the following medications to your pharmacy for you to pick up at your convenience: Colestid  Continue your Zantac twice day   Please follow up with Dr. Henrene Pastor on 02/02/2017 at 1:45pm

## 2016-12-15 NOTE — Progress Notes (Signed)
HISTORY OF PRESENT ILLNESS:  Alexandra Powers is a 46 y.o. female, [place] [phone] fan, with morbid obesity, sleep apnea, hypertension, and osteoarthritis who is referred today by her primary care provider Dr. Quay Burow with multiple chief complaints including belching, bloating, intermittent nausea with vomiting, diarrhea, weight loss, and abdominal discomfort. Patient had abdominal complaints that began a little over 1 year. Found to have cholelithiasis and subsequently underwent cholecystectomy in January 2017. Patient reports that since that time she has had some problems with regurgitation, pyrosis, and intermittent nausea with vomiting. She will often notice undigested food in her vomitus many hours after eating. Occasionally the next day. She does report about 50 pound weight loss over the past 18 months. She states that she relates this to eating much less than reviews. Describes early satiety. Was recently placed on ranitidine twice daily. This has significantly improved her reflux symptoms and issues with vomiting. Next, she reports loose bowel movements since her cholecystectomy. Has this almost every day. She describes them as diarrhea. Exacerbated by meals. No bleeding. No nocturnal symptoms. She did have a CT scan of the abdomen and pelvis January 2017. Small fat-containing supraumbilical hernia and evidence of prior cholecystectomy. Other incidental findings as noted. Comprehensive metabolic panel January [id number] remarkable for mild renal insufficiency. Blood counts in August revealed hemoglobin 11.7. There is no family history of colon cancer.  REVIEW OF SYSTEMS:  All non-GI ROS negative except for arthritis, back pain, fatigue, menstrual pain, menstrual cramps, muscle cramps,  Past Medical History:  Diagnosis Date  . Arthritis   . Gallstones   . Hypertension   . Obesity   . Sleep apnea     Past Surgical History:  Procedure Laterality Date  . CHOLECYSTECTOMY N/A 12/21/2015   Procedure:  LAPAROSCOPIC CHOLECYSTECTOMY;  Surgeon: Georganna Skeans, MD;  Location: [facility];  Service: General;  Laterality: N/A;  . UMBILICAL HERNIA REPAIR N/A 12/27/2015   Procedure: INCARCERATED UMBILICAL HERNIA REPAIR;  Surgeon: Coralie Keens, MD;  Location: [place];  Service: General;  Laterality: N/A;    Social History Alexandra Powers  reports that she has been smoking Cigarettes.  She has a 12.00 pack-year smoking history. She has never used smokeless tobacco. She reports that she drinks about 1.2 oz of alcohol per week . She reports that she uses drugs, including Marijuana.  family history includes Arthritis in her mother; Breast cancer in her paternal aunt.  No Known Allergies     PHYSICAL EXAMINATION: Vital signs: BP 112/68   Pulse 88   Ht 5\' 8"  (1.727 m)   Wt (!) 358 lb 6.4 oz (162.6 kg)   BMI 54.49 kg/m   Constitutional: Pleasant, markedly obese, but generally well-appearing, no acute distress Psychiatric: alert and oriented x3, cooperative Eyes: extraocular movements intact, anicteric, conjunctiva pink Mouth: oral pharynx moist, no lesions Neck: supple without thyromegaly Lymph: no lymphadenopathy Cardiovascular: heart regular rate and rhythm, no murmur Lungs: clear to auscultation bilaterally Abdomen: soft, obese, nontender, nondistended, no obvious ascites, no peritoneal signs, normal bowel sounds, no organomegaly Rectal: Omitted, Extremities: no clubbing cyanosis or lower extremity edema bilaterally Skin: no lesions on visible extremities. Multiple tattoos Neuro: No focal deficits. Cranial nerves intact. No asterixis.   ASSESSMENT:  #1. GERD. Improved on ranitidine #2. Probable gastroparesis to explain intermittent nausea with vomiting of undigested food #3. Diarrhea. Suspect post cholecystectomy related to bile salts #4. Morbid obesity. Ongoing #5. Weight loss. Seemingly related to decreased by mouth intake #6. Status post cholecystectomy   PLAN:  #  1. Reflux precautions  with attention to weight loss #2. Continue ranitidine twice daily. May prescribed PPI this becomes ineffective #3. Prescribed Colestid 1 g twice a day for diarrhea. Increased to 2 g twice a day if needed #4. Routine GI follow-up in 6 weeks to assess response to therapies  A copy of this consultation note has been sent to Dr. Quay Burow

## 2016-12-21 ENCOUNTER — Telehealth: Payer: Self-pay | Admitting: Pulmonary Disease

## 2016-12-21 NOTE — Telephone Encounter (Signed)
I spoke with Deatra Canter with [facility] in [place]. Deatra Canter provided with me with Heather's number at the [facility] office, as it appears Nira Conn has been working with pt. Deatra Canter states according to notes on 12-10-16, pt called in check on status of order. Heather informed pt that she was still trying to verify benefits and would contact pt soon to schedule set up. I have left a message for Nira Conn to return our call to f/u on this.  Heather's contact number is [phone]

## 2016-12-23 NOTE — Telephone Encounter (Signed)
Called [facility] and spoke with Melissa. Nira Conn was not in the office today. Melissa states that they have been in contact with the pt, they are still waiting on authorization from the pt's insurance company for her CPAP benefits. They last contacted the pt on 12/10/16. Pt is aware of this information and will continue to check back with [facility]. Nothing further was needed.

## 2016-12-29 DIAGNOSIS — I10 Essential (primary) hypertension: Secondary | ICD-10-CM | POA: Diagnosis not present

## 2016-12-29 DIAGNOSIS — Z6841 Body Mass Index (BMI) 40.0 and over, adult: Secondary | ICD-10-CM | POA: Diagnosis not present

## 2016-12-29 DIAGNOSIS — M47817 Spondylosis without myelopathy or radiculopathy, lumbosacral region: Secondary | ICD-10-CM | POA: Diagnosis not present

## 2017-01-04 ENCOUNTER — Telehealth: Payer: Self-pay | Admitting: Pulmonary Disease

## 2017-01-04 NOTE — Telephone Encounter (Signed)
Spoke with pt, states she still has not been contacted for new cpap machine setup.   Called [facility] (where order was sent per pt chart), lmtcb X1 for Heather at [facility] to check status of order.  Wcb.

## 2017-01-05 ENCOUNTER — Ambulatory Visit (INDEPENDENT_AMBULATORY_CARE_PROVIDER_SITE_OTHER): Payer: BLUE CROSS/BLUE SHIELD | Admitting: Orthopaedic Surgery

## 2017-01-05 ENCOUNTER — Ambulatory Visit (INDEPENDENT_AMBULATORY_CARE_PROVIDER_SITE_OTHER): Payer: Self-pay

## 2017-01-05 DIAGNOSIS — G8929 Other chronic pain: Secondary | ICD-10-CM

## 2017-01-05 DIAGNOSIS — M25562 Pain in left knee: Secondary | ICD-10-CM | POA: Diagnosis not present

## 2017-01-05 DIAGNOSIS — M25561 Pain in right knee: Secondary | ICD-10-CM

## 2017-01-05 DIAGNOSIS — M25511 Pain in right shoulder: Secondary | ICD-10-CM | POA: Diagnosis not present

## 2017-01-05 DIAGNOSIS — M25552 Pain in left hip: Secondary | ICD-10-CM | POA: Diagnosis not present

## 2017-01-05 MED ORDER — LIDOCAINE HCL 1 % IJ SOLN
2.0000 mL | INTRAMUSCULAR | Status: AC | PRN
  Administered 2017-01-05: 2 mL

## 2017-01-05 MED ORDER — BUPIVACAINE HCL 0.5 % IJ SOLN
2.0000 mL | INTRAMUSCULAR | Status: AC | PRN
  Administered 2017-01-05: 2 mL via INTRA_ARTICULAR

## 2017-01-05 MED ORDER — METHYLPREDNISOLONE ACETATE 40 MG/ML IJ SUSP
40.0000 mg | INTRAMUSCULAR | Status: AC | PRN
  Administered 2017-01-05: 40 mg via INTRA_ARTICULAR

## 2017-01-05 NOTE — Telephone Encounter (Signed)
lmtcb for [employer] at [employer].

## 2017-01-05 NOTE — Telephone Encounter (Signed)
Heather w/[facility] returning call, CB is [phone].  She states she spoke with patient this am and she was on the way to a doctor's appointment and she is waiting on a call back from the patient to schedule.

## 2017-01-05 NOTE — Progress Notes (Signed)
Office Visit Note   Patient: Alexandra Powers           Date of Birth: 02-Jun-1971           MRN: [id number] Visit Date: 01/05/2017              Requested by: Binnie Rail, MD [place], [place] [id number] PCP: Binnie Rail, MD   Assessment & Plan: Visit Diagnoses:  1. Chronic right shoulder pain   2. Chronic pain of both knees   3. 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 Both knees were injected today with cortisone. We'll refer to Dr. Ernestina Patches for left hip injection. Follow up with me as needed. In discussed the importance of weight loss. Total face to face encounter time was greater than 45 minutes and over half of this time was spent in counseling and/or coordination of care.  Follow-Up Instructions: No Follow-up on file.   Orders:  Orders Placed This Encounter  Procedures  . XR Pelvis 1-2 Views  . XR Knee 1-2 Views Right  . XR Knee 1-2 Views Left  . XR Shoulder Right   No orders of the defined types were placed in this encounter.     Procedures: Large Joint Inj Date/Time: 01/05/2017 5:46 PM Performed by: Leandrew Koyanagi Authorized by: Leandrew Koyanagi   Consent Given by:  Patient Timeout: prior to procedure the correct patient, procedure, and site was verified   Indications:  Pain Location:  Knee Site:  R knee Prep: patient was prepped and draped in usual sterile fashion   Needle Size:  22 G Ultrasound Guidance: No   Fluoroscopic Guidance: No   Arthrogram: No   Patient tolerance:  Patient tolerated the procedure well with no immediate complications Large Joint Inj Date/Time: 01/05/2017 5:46 PM Performed by: Leandrew Koyanagi Authorized by: Leandrew Koyanagi   Consent Given by:  Patient Timeout: prior to procedure the correct patient, procedure, and site was verified   Indications:  Pain Location:  Knee Site:  R knee Prep: patient was prepped and draped in usual sterile fashion   Needle Size:  22 G Ultrasound  Guidance: No   Fluoroscopic Guidance: No   Arthrogram: No   Medications:  2 mL lidocaine 1 %; 2 mL bupivacaine 0.5 %; 40 mg methylPREDNISolone acetate 40 MG/ML Patient tolerance:  Patient tolerated the procedure well with no immediate complications     Clinical Data: No additional findings.   Subjective: Chief Complaint  Patient presents with  . Right Knee - Pain  . Left Knee - Pain  . Left Hip - Pain  . Right Shoulder - Pain    Alexandra Powers is a 46 yo female who presents with chief complaints of right shoulder pain, left hip pain, and bilateral knee pain. Her shoulder pain has been present for the past two weeks. She describes it as intermittent, pulling pain that is worse with movement. She ranks this a 4-7 out of 10 pain. The patient complains of intermittent left hip and groin pain for the past 4-6 months that is worse with walking. She has tried ibuprofen and Vicodin with minimal relief. Her pain is 6/10 and is limiting her daily activities.     Review of Systems Complete review of systems negative except for history of present illness  Objective: Vital Signs: There were no vitals taken for this visit.  Physical Exam  Constitutional:  She is oriented to person, place, and time. She appears well-developed and well-nourished.  HENT:  Head: Normocephalic and atraumatic.  Eyes: EOM are normal.  Neck: Neck supple.  Pulmonary/Chest: Effort normal.  Abdominal: Soft.  Neurological: She is alert and oriented to person, place, and time.  Skin: Skin is warm. Capillary refill takes less than 2 seconds.  Psychiatric: She has a normal mood and affect. Her behavior is normal. Judgment and thought content normal.  Nursing note and vitals reviewed.   Ortho Exam Exam of left hip shows positive Stinchfield sign. Pain with rotation of the hip. Total hip is nontender. No radicular signs. Exam of bilateral knee shows no joint effusion. She has normal range of motion. Collaterals and  cruciates are stable. Exam of the right shoulder shows good range of motion. She has no focal or motor sensory deficits. Muscular testing is normal. Specialty Comments:  No specialty comments available.  Imaging: Xr Knee 1-2 Views Left  Result Date: 01/05/2017 Severe DJD  Xr Knee 1-2 Views Right  Result Date: 01/05/2017 Severe DJD  Xr Pelvis 1-2 Views  Result Date: 01/05/2017 Mild DJD of hips  Xr Shoulder Right  Result Date: 01/05/2017 No acute findings    PMFS History: Patient Active Problem List   Diagnosis Date Noted  . OSA (obstructive sleep apnea) 12/10/2016  . GERD (gastroesophageal reflux disease) 12/04/2016  . Hypersomnia 10/29/2016  . Back pain 08/21/2016  . Vomiting 08/21/2016  . Chronic pain of multiple joints 08/13/2016  . MGUS (monoclonal gammopathy of unknown significance) 07/02/2016  . Vitamin D deficiency 07/02/2016  . Diarrhea 07/02/2016  . Joint pain 04/03/2016  . HTN (hypertension), benign 11/11/2015  . Morbid obesity ([place]) 11/11/2015  . Adrenal mass ([place]) 08/30/2015  . Ovarian cyst 08/30/2015   Past Medical History:  Diagnosis Date  . Arthritis   . Gallstones   . Hypertension   . Obesity   . Sleep apnea     Family History  Problem Relation Age of Onset  . Arthritis Mother   . Breast cancer Paternal Aunt   . Adrenal disorder Neg Hx   . Colon cancer Neg Hx   . Esophageal cancer Neg Hx   . Pancreatic cancer Neg Hx   . Stomach cancer Neg Hx   . Liver disease Neg Hx     Past Surgical History:  Procedure Laterality Date  . CHOLECYSTECTOMY N/A 12/21/2015   Procedure: LAPAROSCOPIC CHOLECYSTECTOMY;  Surgeon: Georganna Skeans, MD;  Location: [facility];  Service: General;  Laterality: N/A;  . UMBILICAL HERNIA REPAIR N/A 12/27/2015   Procedure: INCARCERATED UMBILICAL HERNIA REPAIR;  Surgeon: Coralie Keens, MD;  Location: [facility];  Service: General;  Laterality: N/A;   Social History   Occupational History  .      unemploed   Social History Main  Topics  . Smoking status: Current Every Day Smoker    Packs/day: 0.50    Years: 24.00    Types: Cigarettes  . Smokeless tobacco: Never Used  . Alcohol use 1.2 oz/week    2 Shots of liquor per week     Comment: per week  . Drug use: Yes    Types: Marijuana  . Sexual activity: Not on file

## 2017-01-05 NOTE — Telephone Encounter (Signed)
[place] x 1 with [facility] office to check on cpap set up for pt.

## 2017-01-05 NOTE — Telephone Encounter (Signed)
Heather from [facility], states that she contacted patient this morning.   Spoke with pt, verified this.  Nothing further needed at this time.

## 2017-01-11 ENCOUNTER — Encounter (INDEPENDENT_AMBULATORY_CARE_PROVIDER_SITE_OTHER): Payer: Self-pay | Admitting: Physical Medicine and Rehabilitation

## 2017-01-11 ENCOUNTER — Ambulatory Visit (INDEPENDENT_AMBULATORY_CARE_PROVIDER_SITE_OTHER): Payer: BLUE CROSS/BLUE SHIELD | Admitting: Physical Medicine and Rehabilitation

## 2017-01-11 ENCOUNTER — Ambulatory Visit (INDEPENDENT_AMBULATORY_CARE_PROVIDER_SITE_OTHER): Payer: Self-pay

## 2017-01-11 VITALS — BP 129/81

## 2017-01-11 DIAGNOSIS — M25552 Pain in left hip: Secondary | ICD-10-CM

## 2017-01-11 MED ORDER — TRIAMCINOLONE ACETONIDE 40 MG/ML IJ SUSP
80.0000 mg | INTRAMUSCULAR | Status: AC | PRN
  Administered 2017-01-11: 80 mg via INTRA_ARTICULAR

## 2017-01-11 MED ORDER — LIDOCAINE HCL 2 % IJ SOLN
4.0000 mL | INTRAMUSCULAR | Status: AC | PRN
  Administered 2017-01-11: 4 mL

## 2017-01-11 NOTE — Progress Notes (Signed)
Alexandra Powers - 46 y.o. female MRN [id number]  Date of birth: [birth year]  Office Visit Note: Visit Date: 01/11/2017 PCP: Alexandra Rail, MD Referred by: Alexandra Rail, MD  Subjective: Chief Complaint  Patient presents with  . Left Hip - Pain   HPI: Alexandra Powers is a 46 year old pleasant female who unfortunately is morbidly obese with chronic hip and knee pain. Left hip pain on and off for around 1 year. Worse for the last month. Groin pain radiating down thigh. Worse with walking and laying on that side. Alexandra Powers requests a diagnostic and hopefully therapeutic anesthetic hip arthrogram.    ROS Otherwise per HPI.  Assessment & Plan: Visit Diagnoses:  1. Pain in left hip     Plan: Findings:  Left hip anesthetic arthrogram. Patient did have some relief with the anesthetic portion of the injection.    Meds & Orders: No orders of the defined types were placed in this encounter.   Orders Placed This Encounter  Procedures  . Large Joint Injection/Arthrocentesis  . XR C-ARM NO REPORT    Follow-up: Return if symptoms worsen or fail to improve, for Alexandra Powers.   Procedures: Large Joint Inj Date/Time: 01/11/2017 8:43 AM Performed by: Alexandra Powers Authorized by: Alexandra Powers   Consent Given by:  Patient Site marked: the procedure site was marked   Timeout: prior to procedure the correct patient, procedure, and site was verified   Indications:  Pain and diagnostic evaluation Location:  Hip Site:  L hip joint Prep: patient was prepped and draped in usual sterile fashion   Needle Size:  22 G (5.10minches) Approach:  Anterior Ultrasound Guidance: No   Fluoroscopic Guidance: No   Arthrogram: Yes   Medications:  4 mL lidocaine 2 %; 80 mg triamcinolone acetonide 40 MG/ML Aspiration Attempted: Yes   Patient tolerance:  Patient tolerated the procedure well with no immediate complications  Arthrogram demonstrated excellent flow of contrast throughout the joint surface without  extravasation or obvious defect.  The patient had relief of symptoms during the anesthetic phase of the injection.     No notes on file   Clinical History: No specialty comments available.  She reports that she has been smoking Cigarettes.  She has a 12.00 pack-year smoking history. She has never used smokeless tobacco. No results for input(s): HGBA1C, LABURIC in the last 8760 hours.  Objective:  VS:  HT:    WT:   BMI:     BP:129/81  HR: bpm  TEMP: ( )  RESP:  Physical Exam  Musculoskeletal:  Patient is morbidly obese she has pain with internal rotation of the left hip.    Ortho Exam Imaging: Xr C-arm No Report  Result Date: 01/11/2017 Please see Notes or Procedures tab for imaging impression.   Past Medical/Family/Surgical/Social History: Medications & Allergies reviewed per EMR Patient Active Problem List   Diagnosis Date Noted  . OSA (obstructive sleep apnea) 12/10/2016  . GERD (gastroesophageal reflux disease) 12/04/2016  . Hypersomnia 10/29/2016  . Back pain 08/21/2016  . Vomiting 08/21/2016  . Chronic pain of multiple joints 08/13/2016  . MGUS (monoclonal gammopathy of unknown significance) 07/02/2016  . Vitamin D deficiency 07/02/2016  . Diarrhea 07/02/2016  . Joint pain 04/03/2016  . HTN (hypertension), benign 11/11/2015  . Morbid obesity ([place]) 11/11/2015  . Adrenal mass ([place]) 08/30/2015  . Ovarian cyst 08/30/2015   Past Medical History:  Diagnosis Date  . Arthritis   . Gallstones   . Hypertension   .  Obesity   . Sleep apnea    Family History  Problem Relation Age of Onset  . Arthritis Mother   . Breast cancer Paternal Aunt   . Adrenal disorder Neg Hx   . Colon cancer Neg Hx   . Esophageal cancer Neg Hx   . Pancreatic cancer Neg Hx   . Stomach cancer Neg Hx   . Liver disease Neg Hx    Past Surgical History:  Procedure Laterality Date  . CHOLECYSTECTOMY N/A 12/21/2015   Procedure: LAPAROSCOPIC CHOLECYSTECTOMY;  Surgeon: Alexandra Skeans, MD;   Location: [facility];  Service: General;  Laterality: N/A;  . UMBILICAL HERNIA REPAIR N/A 12/27/2015   Procedure: INCARCERATED UMBILICAL HERNIA REPAIR;  Surgeon: Alexandra Keens, MD;  Location: [facility];  Service: General;  Laterality: N/A;   Social History   Occupational History  .      unemploed   Social History Main Topics  . Smoking status: Current Every Day Smoker    Packs/day: 0.50    Years: 24.00    Types: Cigarettes  . Smokeless tobacco: Never Used  . Alcohol use 1.2 oz/week    2 Shots of liquor per week     Comment: per week  . Drug use: Yes    Types: Marijuana  . Sexual activity: Not on file

## 2017-01-11 NOTE — Patient Instructions (Signed)

## 2017-01-18 DIAGNOSIS — M47817 Spondylosis without myelopathy or radiculopathy, lumbosacral region: Secondary | ICD-10-CM | POA: Diagnosis not present

## 2017-01-18 DIAGNOSIS — G4733 Obstructive sleep apnea (adult) (pediatric): Secondary | ICD-10-CM | POA: Diagnosis not present

## 2017-02-02 ENCOUNTER — Ambulatory Visit: Payer: BLUE CROSS/BLUE SHIELD | Admitting: Internal Medicine

## 2017-02-04 ENCOUNTER — Other Ambulatory Visit: Payer: Self-pay | Admitting: Internal Medicine

## 2017-02-05 MED ORDER — ERGOCALCIFEROL 1.25 MG (50000 UT) PO CAPS: 50000.0000 [IU] | ORAL_CAPSULE | ORAL | 0 refills | Status: DC

## 2017-02-10 DIAGNOSIS — M25571 Pain in right ankle and joints of right foot: Secondary | ICD-10-CM | POA: Diagnosis not present

## 2017-02-10 DIAGNOSIS — M2550 Pain in unspecified joint: Secondary | ICD-10-CM | POA: Diagnosis not present

## 2017-02-10 DIAGNOSIS — R700 Elevated erythrocyte sedimentation rate: Secondary | ICD-10-CM | POA: Diagnosis not present

## 2017-02-10 DIAGNOSIS — M5136 Other intervertebral disc degeneration, lumbar region: Secondary | ICD-10-CM | POA: Diagnosis not present

## 2017-02-10 DIAGNOSIS — M150 Primary generalized (osteo)arthritis: Secondary | ICD-10-CM | POA: Diagnosis not present

## 2017-02-11 ENCOUNTER — Ambulatory Visit: Payer: BLUE CROSS/BLUE SHIELD | Admitting: Acute Care

## 2017-02-11 DIAGNOSIS — M170 Bilateral primary osteoarthritis of knee: Secondary | ICD-10-CM | POA: Diagnosis not present

## 2017-02-11 DIAGNOSIS — M25562 Pain in left knee: Secondary | ICD-10-CM | POA: Diagnosis not present

## 2017-02-11 DIAGNOSIS — G8929 Other chronic pain: Secondary | ICD-10-CM | POA: Diagnosis not present

## 2017-02-11 DIAGNOSIS — M25561 Pain in right knee: Secondary | ICD-10-CM | POA: Diagnosis not present

## 2017-02-15 ENCOUNTER — Telehealth: Payer: Self-pay | Admitting: Internal Medicine

## 2017-02-15 DIAGNOSIS — G4733 Obstructive sleep apnea (adult) (pediatric): Secondary | ICD-10-CM | POA: Diagnosis not present

## 2017-02-15 NOTE — Telephone Encounter (Signed)
Patient needs something to help her lose weight. She needs to lose 100 lbs.

## 2017-02-15 NOTE — Telephone Encounter (Signed)
Please advise 

## 2017-02-15 NOTE — Telephone Encounter (Signed)
She should call her insurance to find out what weight loss medications are covered and then schedule an appt with me to discuss

## 2017-02-16 NOTE — Telephone Encounter (Signed)
Spoke with pt, she will contact her insurance company and will call us back to schedule appt.

## 2017-02-20 NOTE — Assessment & Plan Note (Addendum)
With complications including OSA, htn, gerd Discussed weight loss options, including surgery, medications, lifestyle,  She is not interested in surgery Her insurance will cover phentermine, but this is short term and will likely not be very effective We will try to get saxenda approved - she needs a long term medication Will use phentermine for 3 months if we have to Stressed lifestyle changes - dietary changes discussed, stressed regular exercise Discussed medication side effects of both medications Referred to nutrition Follow up in 5 weeks

## 2017-02-20 NOTE — Patient Instructions (Addendum)
  Medications reviewed and updated.  Changes include trying saxenda for weight loss  Your prescription(s) have been submitted to your pharmacy. Please take as directed and contact our office if you believe you are having problem(s) with the medication(s).   Please followup in 5 weeks

## 2017-02-20 NOTE — Progress Notes (Signed)
Subjective:    [name], female    DOB: Jun 01, 1971, 46 y.o.   MRN: [id number]  HPI She is here for an acute visit to discuss weight loss medications.   Morbid obesity:  She is not currently exercising but plans on joining [employer] and doing water exercise.  She has started to eat more vegetables.  She uses no salt. She does not want to consider surgery.  She would like to try medication and call her insurance - they will cover short term medications only.   She is still smoking, but trying to cut down.    Medications and allergies reviewed with patient and updated if appropriate.  Patient Active Problem List   Diagnosis Date Noted  . OSA (obstructive sleep apnea) 12/10/2016  . GERD (gastroesophageal reflux disease) 12/04/2016  . Hypersomnia 10/29/2016  . Back pain 08/21/2016  . Vomiting 08/21/2016  . Chronic pain of multiple joints 08/13/2016  . MGUS (monoclonal gammopathy of unknown significance) 07/02/2016  . Vitamin D deficiency 07/02/2016  . Diarrhea 07/02/2016  . Joint pain 04/03/2016  . HTN (hypertension), benign 11/11/2015  . Morbid obesity ([place]) 11/11/2015  . Adrenal mass ([place]) 08/30/2015  . Ovarian cyst 08/30/2015    Current Outpatient Prescriptions on File Prior to Visit  Medication Sig Dispense Refill  . amLODipine (NORVASC) 10 MG tablet Take 1 tablet (10 mg total) by mouth daily. 90 tablet 3  . colestipol (COLESTID) 1 g tablet Take 1 tablet (1 g total) by mouth 2 (two) times daily. 60 tablet 3  . ergocalciferol (VITAMIN D2) 50000 units capsule Take 1 capsule (50,000 Units total) by mouth once a week. 12 capsule 0  . hydrochlorothiazide (HYDRODIURIL) 25 MG tablet Take 1 tablet (25 mg total) by mouth daily. 90 tablet 3  . hyoscyamine (LEVSIN, ANASPAZ) 0.125 MG tablet Take 1 tablet (0.125 mg total) by mouth every 4 (four) hours as needed for cramping. 90 tablet 5  . lisinopril (PRINIVIL,ZESTRIL) 30 MG tablet Take 1 tablet (30 mg total) by mouth daily. 90  tablet 3  . potassium chloride SA (K-DUR,KLOR-CON) 20 MEQ tablet Take 1 tablet (20 mEq total) by mouth daily. 30 tablet 3  . tizanidine (ZANAFLEX) 6 MG capsule Take 1 capsule (6 mg total) by mouth 3 (three) times daily as needed for muscle spasms. 90 capsule 5  . [DISCONTINUED] losartan-hydrochlorothiazide (HYZAAR) 50-12.5 MG per tablet Take 1 tablet by mouth daily. 30 tablet 5   No current facility-administered medications on file prior to visit.     Past Medical History:  Diagnosis Date  . Arthritis   . Gallstones   . Hypertension   . Obesity   . Sleep apnea     Past Surgical History:  Procedure Laterality Date  . CHOLECYSTECTOMY N/A 12/21/2015   Procedure: LAPAROSCOPIC CHOLECYSTECTOMY;  Surgeon: Georganna Skeans, MD;  Location: [facility];  Service: General;  Laterality: N/A;  . UMBILICAL HERNIA REPAIR N/A 12/27/2015   Procedure: INCARCERATED UMBILICAL HERNIA REPAIR;  Surgeon: Coralie Keens, MD;  Location: [facility];  Service: General;  Laterality: N/A;    Social History   Social History  . Marital status: Married    Spouse name: N/A  . Number of children: 2  . Years of education: N/A   Occupational History  .      unemploed   Social History Main Topics  . Smoking status: Current Every Day Smoker    Packs/day: 0.50    Years: 24.00    Types:  Cigarettes  . Smokeless tobacco: Never Used  . Alcohol use 1.2 oz/week    2 Shots of liquor per week     Comment: per week  . Drug use: Yes    Types: Marijuana  . Sexual activity: Not on file   Other Topics Concern  . Not on file   Social History Narrative   Lives with husband, Berneta Sages.    Family History  Problem Relation Age of Onset  . Arthritis Mother   . Breast cancer Paternal Aunt   . Adrenal disorder Neg Hx   . Colon cancer Neg Hx   . Esophageal cancer Neg Hx   . Pancreatic cancer Neg Hx   . Stomach cancer Neg Hx   . Liver disease Neg Hx     Review of Systems  Constitutional: Negative for fever.  Respiratory:  Positive for cough (dry ). Negative for shortness of breath and wheezing.   Cardiovascular: Negative for chest pain, palpitations and leg swelling.  Musculoskeletal: Positive for arthralgias (osteoarthritis).  Neurological: Negative for dizziness, light-headedness and headaches.       Objective:   Vitals:   02/22/17 0805  BP: 138/86  Pulse: 97  Resp: 18  Temp: 97.7 F (36.5 C)   Filed Weights   02/22/17 0805  Weight: (!) 359 lb (162.8 kg)   Body mass index is 54.59 kg/m.  Wt Readings from Last 3 Encounters:  02/22/17 (!) 359 lb (162.8 kg)  12/15/16 (!) 358 lb 6.4 oz (162.6 kg)  12/10/16 (!) 355 lb (161 kg)     Physical Exam Constitutional: Appears well-developed and well-nourished. No distress.  HENT:  Head: Normocephalic and atraumatic.  Neck: Neck supple. No tracheal deviation present. No thyromegaly present.  No cervical lymphadenopathy Cardiovascular: Normal rate, regular rhythm and normal heart sounds.   No murmur heard. No carotid bruit .  No edema Pulmonary/Chest: Effort normal and breath sounds normal. No respiratory distress. No has no wheezes. No rales.  Skin: Skin is warm and dry. Not diaphoretic.  Psychiatric: Normal mood and affect. Behavior is normal.         Assessment & Plan:   See Problem List for Assessment and Plan of chronic medical problems.  FU one month after starting new medication

## 2017-02-22 ENCOUNTER — Encounter: Payer: Self-pay | Admitting: Internal Medicine

## 2017-02-22 ENCOUNTER — Ambulatory Visit (INDEPENDENT_AMBULATORY_CARE_PROVIDER_SITE_OTHER): Payer: BLUE CROSS/BLUE SHIELD | Admitting: Internal Medicine

## 2017-02-22 DIAGNOSIS — I10 Essential (primary) hypertension: Secondary | ICD-10-CM | POA: Diagnosis not present

## 2017-02-22 MED ORDER — INSULIN PEN NEEDLE 31G X 8 MM MISC: 5 refills | Status: DC

## 2017-02-22 MED ORDER — LIRAGLUTIDE -WEIGHT MANAGEMENT 18 MG/3ML ~~LOC~~ SOPN: 0.6000 mg | PEN_INJECTOR | Freq: Every day | SUBCUTANEOUS | 5 refills | Status: DC

## 2017-02-22 NOTE — Progress Notes (Signed)
Pre visit review using our clinic review tool, if applicable. No additional management support is needed unless otherwise documented below in the visit note. 

## 2017-02-22 NOTE — Assessment & Plan Note (Signed)
Borderline high, no change in meds today Follow up 4 weeks after starting weight loss medication Monitor BP at home if possible

## 2017-02-23 ENCOUNTER — Telehealth: Payer: Self-pay | Admitting: Emergency Medicine

## 2017-02-23 NOTE — Telephone Encounter (Signed)
PA has been approved for Saxenda until 06/23/17. Pharmacy notified.

## 2017-02-24 ENCOUNTER — Telehealth: Payer: Self-pay | Admitting: *Deleted

## 2017-02-24 ENCOUNTER — Ambulatory Visit: Payer: BLUE CROSS/BLUE SHIELD | Admitting: Acute Care

## 2017-02-24 NOTE — Telephone Encounter (Signed)
Rec'd call pt states she did not receive the pen for the weight loss. Inform pt per chart the Saxenda needed a prior authorization,and which medication has been approved. She should be able to pick-up today...Johny Chess

## 2017-03-01 DIAGNOSIS — M47817 Spondylosis without myelopathy or radiculopathy, lumbosacral region: Secondary | ICD-10-CM | POA: Diagnosis not present

## 2017-03-01 DIAGNOSIS — I10 Essential (primary) hypertension: Secondary | ICD-10-CM | POA: Diagnosis not present

## 2017-03-01 DIAGNOSIS — Z6841 Body Mass Index (BMI) 40.0 and over, adult: Secondary | ICD-10-CM | POA: Diagnosis not present

## 2017-03-28 NOTE — Progress Notes (Signed)
Subjective:    [name], female    DOB: 1970/12/04, 46 y.o.   MRN: [id number]  HPI The patient is here for follow up.  Obesity:  She was started on Saxenda.  At first she had nausea/vomiting but that has resolved.  She is exercising minimally.  She plans on joining [employer] and doing water exercise.  She is having back, knee and hip pain.  She will have gel injections into her knees.  She is eating more healthy - smaller meals and has not been snacking.  She gets full faster.    Hypertension: She is taking her medication daily. She is compliant with a low sodium diet.  She denies chest pain, palpitations, shortness of breath and regular headaches. She does not monitor her blood pressure at home.     Medications and allergies reviewed with patient and updated if appropriate.  Patient Active Problem List   Diagnosis Date Noted  . OSA (obstructive sleep apnea) 12/10/2016  . GERD (gastroesophageal reflux disease) 12/04/2016  . Hypersomnia 10/29/2016  . Back pain 08/21/2016  . Vomiting 08/21/2016  . Chronic pain of multiple joints 08/13/2016  . MGUS (monoclonal gammopathy of unknown significance) 07/02/2016  . Vitamin D deficiency 07/02/2016  . Diarrhea 07/02/2016  . Joint pain 04/03/2016  . HTN (hypertension), benign 11/11/2015  . Morbid obesity ([place]) 11/11/2015  . Adrenal mass ([place]) 08/30/2015  . Ovarian cyst 08/30/2015    Current Outpatient Prescriptions on File Prior to Visit  Medication Sig Dispense Refill  . amLODipine (NORVASC) 10 MG tablet Take 1 tablet (10 mg total) by mouth daily. 90 tablet 3  . ergocalciferol (VITAMIN D2) 50000 units capsule Take 1 capsule (50,000 Units total) by mouth once a week. 12 capsule 0  . hydrochlorothiazide (HYDRODIURIL) 25 MG tablet Take 1 tablet (25 mg total) by mouth daily. 90 tablet 3  . hyoscyamine (LEVSIN, ANASPAZ) 0.125 MG tablet Take 1 tablet (0.125 mg total) by mouth every 4 (four) hours as needed for cramping. 90  tablet 5  . Insulin Pen Needle (B-D ULTRAFINE III SHORT PEN) 31G X 8 MM MISC Use as directed daily with saxenda 30 each 5  . Liraglutide -Weight Management (SAXENDA) 18 MG/3ML SOPN Inject 0.6 mg into the skin daily. Take for one week and then increase by 0.6 mg Q wk until 3 mg [place] daily 3 pen 5  . lisinopril (PRINIVIL,ZESTRIL) 30 MG tablet Take 1 tablet (30 mg total) by mouth daily. 90 tablet 3  . potassium chloride SA (K-DUR,KLOR-CON) 20 MEQ tablet Take 1 tablet (20 mEq total) by mouth daily. 30 tablet 3  . tizanidine (ZANAFLEX) 6 MG capsule Take 1 capsule (6 mg total) by mouth 3 (three) times daily as needed for muscle spasms. 90 capsule 5  . [DISCONTINUED] losartan-hydrochlorothiazide (HYZAAR) 50-12.5 MG per tablet Take 1 tablet by mouth daily. 30 tablet 5   No current facility-administered medications on file prior to visit.     Past Medical History:  Diagnosis Date  . Arthritis   . Gallstones   . Hypertension   . Obesity   . Sleep apnea     Past Surgical History:  Procedure Laterality Date  . CHOLECYSTECTOMY N/A 12/21/2015   Procedure: LAPAROSCOPIC CHOLECYSTECTOMY;  Surgeon: Georganna Skeans, MD;  Location: [facility];  Service: General;  Laterality: N/A;  . UMBILICAL HERNIA REPAIR N/A 12/27/2015   Procedure: INCARCERATED UMBILICAL HERNIA REPAIR;  Surgeon: Coralie Keens, MD;  Location: [facility];  Service: General;  Laterality: N/A;    Social History   Social History  . Marital status: Married    Spouse name: N/A  . Number of children: 2  . Years of education: N/A   Occupational History  .      unemploed   Social History Main Topics  . Smoking status: Current Every Day Smoker    Packs/day: 0.50    Years: 24.00    Types: Cigarettes  . Smokeless tobacco: Never Used  . Alcohol use 1.2 oz/week    2 Shots of liquor per week     Comment: per week  . Drug use: Yes    Types: Marijuana  . Sexual activity: Not on file   Other Topics Concern  . Not on file   Social History  Narrative   Lives with husband, Berneta Sages.    Family History  Problem Relation Age of Onset  . Arthritis Mother   . Breast cancer Paternal Aunt   . Adrenal disorder Neg Hx   . Colon cancer Neg Hx   . Esophageal cancer Neg Hx   . Pancreatic cancer Neg Hx   . Stomach cancer Neg Hx   . Liver disease Neg Hx     Review of Systems  Constitutional: Negative for fever.  Respiratory: Negative for cough, shortness of breath and wheezing.   Cardiovascular: Positive for leg swelling (from knee pain). Negative for chest pain and palpitations.  Gastrointestinal: Negative for abdominal pain, nausea and vomiting.  Neurological: Negative for light-headedness and headaches.       Objective:   Vitals:   03/29/17 1037  BP: 130/84  Pulse: (!) 107  Resp: 16  Temp: 98.6 F (37 C)   Wt Readings from Last 3 Encounters:  03/29/17 (!) 347 lb (157.4 kg)  02/22/17 (!) 359 lb (162.8 kg)  12/15/16 (!) 358 lb 6.4 oz (162.6 kg)   Body mass index is 52.76 kg/m.   Physical Exam    Constitutional: Appears well-developed and well-nourished. No distress.  HENT:  Head: Normocephalic and atraumatic.  Neck: Neck supple. No tracheal deviation present. No thyromegaly present.  No cervical lymphadenopathy Cardiovascular: Normal rate, regular rhythm and normal heart sounds.   No murmur heard. No carotid bruit .  No edema Pulmonary/Chest: Effort normal and breath sounds normal. No respiratory distress. No has no wheezes. No rales.  Skin: Skin is warm and dry. Not diaphoretic.  Psychiatric: Normal mood and affect. Behavior is normal.      Assessment & Plan:    See Problem List for Assessment and Plan of chronic medical problems.

## 2017-03-28 NOTE — Patient Instructions (Addendum)
  Medications reviewed and updated.  No changes recommended at this time.  Your prescription(s) have been submitted to your pharmacy. Please take as directed and contact our office if you believe you are having problem(s) with the medication(s).    Please followup in 6 months   

## 2017-03-29 ENCOUNTER — Ambulatory Visit (INDEPENDENT_AMBULATORY_CARE_PROVIDER_SITE_OTHER): Payer: BLUE CROSS/BLUE SHIELD | Admitting: Internal Medicine

## 2017-03-29 ENCOUNTER — Encounter: Payer: Self-pay | Admitting: Internal Medicine

## 2017-03-29 ENCOUNTER — Other Ambulatory Visit: Payer: Self-pay | Admitting: Internal Medicine

## 2017-03-29 DIAGNOSIS — I10 Essential (primary) hypertension: Secondary | ICD-10-CM

## 2017-03-29 MED ORDER — LISINOPRIL 30 MG PO TABS: 30.0000 mg | ORAL_TABLET | Freq: Every day | ORAL | 3 refills | Status: DC

## 2017-03-29 MED ORDER — AMLODIPINE BESYLATE 10 MG PO TABS: 10.0000 mg | ORAL_TABLET | Freq: Every day | ORAL | 3 refills | Status: DC

## 2017-03-29 MED ORDER — HYDROCHLOROTHIAZIDE 25 MG PO TABS: 25.0000 mg | ORAL_TABLET | Freq: Every day | ORAL | 3 refills | Status: DC

## 2017-03-29 MED ORDER — LIRAGLUTIDE -WEIGHT MANAGEMENT 18 MG/3ML ~~LOC~~ SOPN: 3.0000 mg | PEN_INJECTOR | Freq: Every day | SUBCUTANEOUS | 5 refills | Status: DC

## 2017-03-29 NOTE — Progress Notes (Signed)
Pre visit review using our clinic review tool, if applicable. No additional management support is needed unless otherwise documented below in the visit note. 

## 2017-03-29 NOTE — Assessment & Plan Note (Signed)
BP well controlled Current regimen effective and well tolerated Continue current medications at current doses  

## 2017-03-29 NOTE — Assessment & Plan Note (Signed)
Has lost 12 lbs since she was here last with saxenda Continue saxenda, tolerating it well Stressed regular exercise continue decreased portions, healthy diet Follow up in 6 months, sooner if needed

## 2017-04-01 DIAGNOSIS — G8929 Other chronic pain: Secondary | ICD-10-CM | POA: Diagnosis not present

## 2017-04-01 DIAGNOSIS — M25562 Pain in left knee: Secondary | ICD-10-CM | POA: Diagnosis not present

## 2017-04-01 DIAGNOSIS — M170 Bilateral primary osteoarthritis of knee: Secondary | ICD-10-CM | POA: Diagnosis not present

## 2017-04-01 DIAGNOSIS — M25561 Pain in right knee: Secondary | ICD-10-CM | POA: Diagnosis not present

## 2017-04-08 DIAGNOSIS — M170 Bilateral primary osteoarthritis of knee: Secondary | ICD-10-CM | POA: Diagnosis not present

## 2017-04-13 ENCOUNTER — Ambulatory Visit: Payer: BLUE CROSS/BLUE SHIELD | Admitting: Dietician

## 2017-04-14 DIAGNOSIS — M47817 Spondylosis without myelopathy or radiculopathy, lumbosacral region: Secondary | ICD-10-CM | POA: Diagnosis not present

## 2017-04-14 DIAGNOSIS — M25552 Pain in left hip: Secondary | ICD-10-CM | POA: Diagnosis not present

## 2017-04-15 DIAGNOSIS — M170 Bilateral primary osteoarthritis of knee: Secondary | ICD-10-CM | POA: Diagnosis not present

## 2017-05-18 DIAGNOSIS — M47817 Spondylosis without myelopathy or radiculopathy, lumbosacral region: Secondary | ICD-10-CM | POA: Diagnosis not present

## 2017-05-28 DIAGNOSIS — M170 Bilateral primary osteoarthritis of knee: Secondary | ICD-10-CM | POA: Diagnosis not present

## 2017-05-28 DIAGNOSIS — M25562 Pain in left knee: Secondary | ICD-10-CM | POA: Diagnosis not present

## 2017-05-28 DIAGNOSIS — G8929 Other chronic pain: Secondary | ICD-10-CM | POA: Diagnosis not present

## 2017-05-28 DIAGNOSIS — M25561 Pain in right knee: Secondary | ICD-10-CM | POA: Diagnosis not present

## 2017-05-28 DIAGNOSIS — M25552 Pain in left hip: Secondary | ICD-10-CM | POA: Diagnosis not present

## 2017-05-28 DIAGNOSIS — M25551 Pain in right hip: Secondary | ICD-10-CM | POA: Diagnosis not present

## 2017-06-28 DIAGNOSIS — M47817 Spondylosis without myelopathy or radiculopathy, lumbosacral region: Secondary | ICD-10-CM | POA: Diagnosis not present

## 2017-06-29 ENCOUNTER — Telehealth: Payer: Self-pay | Admitting: Internal Medicine

## 2017-06-29 NOTE — Telephone Encounter (Signed)
Ok to refill 

## 2017-06-29 NOTE — Telephone Encounter (Signed)
pls advise if ok to refill../lmb 

## 2017-06-29 NOTE — Telephone Encounter (Signed)
Pt called in and needs refill on her Liraglutide -Weight Management (SAXENDA) 18 MG/3ML SOPN [[id number]]    [facility] on [address]

## 2017-06-30 MED ORDER — LIRAGLUTIDE -WEIGHT MANAGEMENT 18 MG/3ML ~~LOC~~ SOPN: 3.0000 mg | PEN_INJECTOR | Freq: Every day | SUBCUTANEOUS | 5 refills | Status: DC

## 2017-06-30 NOTE — Telephone Encounter (Signed)
Called pt to verify pharmacy per chart [facility] is not listed. Pt uses [facility] inform MD did approve faxing script to [place]...Alexandra Powers

## 2017-07-01 ENCOUNTER — Telehealth: Payer: Self-pay | Admitting: Emergency Medicine

## 2017-07-01 NOTE — Telephone Encounter (Signed)
PA completed for Saxenda. Awaiting Response. [id number]

## 2017-07-06 NOTE — Telephone Encounter (Signed)
SPoke with [employer], additional information faxed to [facility]

## 2017-07-06 NOTE — Telephone Encounter (Signed)
Pt called back regarding this, she was told we are waiting for a response

## 2017-07-08 NOTE — Telephone Encounter (Signed)
Spoke with [employer], PA is under determination and could take up to 15 days. Called pt to inform.

## 2017-07-13 NOTE — Telephone Encounter (Signed)
Pt called regarding this, Pt informed of message below

## 2017-07-14 NOTE — Telephone Encounter (Signed)
ok 

## 2017-07-14 NOTE — Telephone Encounter (Signed)
Received fax from [facility] stating that they need documentiation that the pt has lost 4% of body weight within the 1st 16 weeks of treatment. Can pt come in for a weight check on the nurse schedule so that we have proper documentation that she has lost weight since starting medication. Pt states she went to neuro surgery on 06/28/17 and weighed 315lbs

## 2017-07-15 NOTE — Telephone Encounter (Signed)
Spoke with pt, she is going to come in for a weight check. Pt is to ask for Geni Bers or Stefannie to obtain proper documentation.

## 2017-07-16 ENCOUNTER — Telehealth: Payer: Self-pay

## 2017-07-16 NOTE — Telephone Encounter (Signed)
Please start PA - I think it was denied initially because we did not have her weight

## 2017-07-16 NOTE — Telephone Encounter (Signed)
Patient came in for a weight check.  Her current weight today is 310lb 04oz and height is 5\' 8"  tall.  On 03/29/2017 her weight was 347lb and height was 5\' 8"  tall. Her reduction of weight is greater than 4%.    Forwarding to Dr. Quay Burow for your recommendation to continue the Saxenda based on current and past weight comparison. Please advise on whether to start PA.

## 2017-07-20 ENCOUNTER — Encounter: Payer: Self-pay | Admitting: Emergency Medicine

## 2017-07-20 NOTE — Telephone Encounter (Signed)
Faxed documentation to [facility] for [place] appeal.   [phone]

## 2017-07-22 NOTE — Telephone Encounter (Signed)
Pt called asking for a update on PA,

## 2017-07-26 MED ORDER — LIRAGLUTIDE -WEIGHT MANAGEMENT 18 MG/3ML ~~LOC~~ SOPN: 3.0000 mg | PEN_INJECTOR | Freq: Every day | SUBCUTANEOUS | 5 refills | Status: DC

## 2017-07-26 NOTE — Telephone Encounter (Signed)
Spoke with pt to inform we received a fax today stating that PA was approved through 07/23/2018. Pharmacy notified

## 2017-08-05 ENCOUNTER — Encounter: Payer: Self-pay | Admitting: Nurse Practitioner

## 2017-08-05 ENCOUNTER — Other Ambulatory Visit (HOSPITAL_BASED_OUTPATIENT_CLINIC_OR_DEPARTMENT_OTHER): Payer: BLUE CROSS/BLUE SHIELD

## 2017-08-05 ENCOUNTER — Ambulatory Visit (INDEPENDENT_AMBULATORY_CARE_PROVIDER_SITE_OTHER): Payer: BLUE CROSS/BLUE SHIELD | Admitting: Nurse Practitioner

## 2017-08-05 VITALS — BP 102/70 | HR 114 | Temp 98.3°F | Ht 68.0 in | Wt 300.8 lb

## 2017-08-05 DIAGNOSIS — D472 Monoclonal gammopathy: Secondary | ICD-10-CM

## 2017-08-05 DIAGNOSIS — N6342 Unspecified lump in left breast, subareolar: Secondary | ICD-10-CM

## 2017-08-05 DIAGNOSIS — N912 Amenorrhea, unspecified: Secondary | ICD-10-CM

## 2017-08-05 DIAGNOSIS — N6452 Nipple discharge: Secondary | ICD-10-CM

## 2017-08-05 LAB — COMPREHENSIVE METABOLIC PANEL
ALT: 10 U/L (ref 0–55)
AST: 18 U/L (ref 5–34)
Albumin: 3.2 g/dL — ABNORMAL LOW (ref 3.5–5.0)
Alkaline Phosphatase: 62 U/L (ref 40–150)
Anion Gap: 12 mEq/L — ABNORMAL HIGH (ref 3–11)
BILIRUBIN TOTAL: 0.46 mg/dL (ref 0.20–1.20)
BUN: 31.2 mg/dL — ABNORMAL HIGH (ref 7.0–26.0)
CO2: 33 meq/L — AB (ref 22–29)
CREATININE: 1.8 mg/dL — AB (ref 0.6–1.1)
Calcium: 10.4 mg/dL (ref 8.4–10.4)
Chloride: 97 mEq/L — ABNORMAL LOW (ref 98–109)
EGFR: 38 mL/min/{1.73_m2} — ABNORMAL LOW (ref >90)
GLUCOSE: 103 mg/dL (ref 70–140)
Potassium: 2.7 mEq/L — CL (ref 3.5–5.1)
SODIUM: 142 meq/L (ref 136–145)
TOTAL PROTEIN: 8.3 g/dL (ref 6.4–8.3)

## 2017-08-05 LAB — CBC WITH DIFFERENTIAL/PLATELET
BASO%: 0.2 % (ref 0.0–2.0)
BASOS ABS: 0 10*3/uL (ref 0.0–0.1)
EOS ABS: 0.2 10*3/uL (ref 0.0–0.5)
EOS%: 1.2 % (ref 0.0–7.0)
HEMATOCRIT: 39.2 % (ref 34.8–46.6)
HGB: 12.8 g/dL (ref 11.6–15.9)
LYMPH%: 28 % (ref 14.0–49.7)
MCH: 32.6 pg (ref 25.1–34.0)
MCHC: 32.7 g/dL (ref 31.5–36.0)
MCV: 99.7 fL (ref 79.5–101.0)
MONO#: 0.7 10*3/uL (ref 0.1–0.9)
MONO%: 5 % (ref 0.0–14.0)
NEUT#: 8.9 10*3/uL — ABNORMAL HIGH (ref 1.5–6.5)
NEUT%: 65.6 % (ref 38.4–76.8)
Platelets: 354 10*3/uL (ref 145–400)
RBC: 3.93 10*6/uL (ref 3.70–5.45)
RDW: 15 % — ABNORMAL HIGH (ref 11.2–14.5)
WBC: 13.5 10*3/uL — ABNORMAL HIGH (ref 3.9–10.3)
lymph#: 3.8 10*3/uL — ABNORMAL HIGH (ref 0.9–3.3)

## 2017-08-05 LAB — POCT URINE PREGNANCY: PREG TEST UR: NEGATIVE

## 2017-08-05 MED ORDER — CEPHALEXIN 500 MG PO CAPS: 500.0000 mg | ORAL_CAPSULE | Freq: Three times a day (TID) | ORAL | 0 refills | Status: DC

## 2017-08-05 NOTE — Progress Notes (Signed)
Subjective:  [name], female    DOB: May 20, 1971  Age: 46 y.o. MRN: [id number]  CC: Cyst (left breast, wamr to touch x 1 week)   HPI Left Breast pain: Chronic, onset over 1year ago, intermittent. Worse in last 2weeks, Associated with nipple discharge, tenderness and increased warmth. Denies any chest wall injury.  Outpatient Medications Prior to Visit  Medication Sig Dispense Refill  . amLODipine (NORVASC) 10 MG tablet Take 1 tablet (10 mg total) by mouth daily. 90 tablet 3  . hydrochlorothiazide (HYDRODIURIL) 25 MG tablet Take 1 tablet (25 mg total) by mouth daily. 90 tablet 3  . Insulin Pen Needle (B-D ULTRAFINE III SHORT PEN) 31G X 8 MM MISC Use as directed daily with saxenda 30 each 5  . Liraglutide -Weight Management (SAXENDA) 18 MG/3ML SOPN Inject 3 mg into the skin daily. 3 pen 5  . lisinopril (PRINIVIL,ZESTRIL) 30 MG tablet Take 1 tablet (30 mg total) by mouth daily. 90 tablet 3  . tizanidine (ZANAFLEX) 6 MG capsule Take 1 capsule (6 mg total) by mouth 3 (three) times daily as needed for muscle spasms. 90 capsule 5  . ergocalciferol (VITAMIN D2) 50000 units capsule Take 1 capsule (50,000 Units total) by mouth once a week. (Patient not taking: Reported on 08/05/2017) 12 capsule 0  . hyoscyamine (LEVSIN, ANASPAZ) 0.125 MG tablet Take 1 tablet (0.125 mg total) by mouth every 4 (four) hours as needed for cramping. (Patient not taking: Reported on 08/05/2017) 90 tablet 5  . potassium chloride SA (K-DUR,KLOR-CON) 20 MEQ tablet Take 1 tablet (20 mEq total) by mouth daily. (Patient not taking: Reported on 08/05/2017) 30 tablet 3  . ranitidine (ZANTAC) 150 MG tablet      No facility-administered medications prior to visit.     ROS See HPI  Objective:  BP 102/70   Pulse (!) 114   Temp 98.3 F (36.8 C) (Oral)   Ht '5\' 8"'  (1.727 m)   Wt (!) 300 lb 12.8 oz (136.4 kg)   LMP 06/18/2017 (Approximate)   SpO2 98%   BMI 45.74 kg/m   BP Readings from Last 3 Encounters:    08/05/17 102/70  03/29/17 130/84  02/22/17 138/86    Wt Readings from Last 3 Encounters:  08/05/17 (!) 300 lb 12.8 oz (136.4 kg)  03/29/17 (!) 347 lb (157.4 kg)  02/22/17 (!) 359 lb (162.8 kg)    Physical Exam  Pulmonary/Chest: Right breast exhibits no inverted nipple, no mass, no nipple discharge, no skin change and no tenderness. Left breast exhibits mass, nipple discharge, skin change and tenderness. Left breast exhibits no inverted nipple. Breasts are asymmetrical.      Lab Results  Component Value Date   WBC 13.5 (H) 08/05/2017   HGB 12.8 08/05/2017   HCT 39.2 08/05/2017   PLT 354 08/05/2017   GLUCOSE 103 08/05/2017   CHOL 182 08/29/2015   TRIG 98 08/29/2015   HDL 40 (L) 08/29/2015   LDLCALC 122 08/29/2015   ALT 10 08/05/2017   AST 18 08/05/2017   NA 142 08/05/2017   K 2.7 (LL) 08/05/2017   CL 103 12/04/2016   CREATININE 1.8 (H) 08/05/2017   BUN 31.2 (H) 08/05/2017   CO2 33 (H) 08/05/2017   TSH 0.975 08/29/2015    Dg Bone Survey Met  Result Date: 07/13/2016 CLINICAL DATA:  Staging for multiple myeloma. History of hypertension. EXAM: METASTATIC BONE SURVEY COMPARISON:  None. FINDINGS: There are no lytic/lucent bone lesions to suggest multiple myeloma. There  are no osteoblastic lesions. Bones appear normally mineralized. 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 IMPRESSION: 1. No evidence of multiple myeloma. No evidence of neoplastic disease to bone. Electronically Signed   By: Lajean Manes M.D.   On: 07/13/2016 12:58    Assessment & Plan:   Alexandra Powers was seen today for cyst.  Diagnoses and all orders for this visit:  Subareolar mass of left breast -     POCT urine pregnancy -     cephALEXin (KEFLEX) 500 MG capsule; Take 1 capsule (500 mg total) by mouth 3 (three) times daily. -     MM DIAG BREAST TOMO UNI LEFT; Future -     US BREAST LTD UNI LEFT INC AXILLA; Future  Nipple discharge in female -     POCT urine pregnancy -      cephALEXin (KEFLEX) 500 MG capsule; Take 1 capsule (500 mg total) by mouth 3 (three) times daily. -     MM DIAG BREAST TOMO UNI LEFT; Future -     US BREAST LTD UNI LEFT INC AXILLA; Future  Amenorrhea -     POCT urine pregnancy -     cephALEXin (KEFLEX) 500 MG capsule; Take 1 capsule (500 mg total) by mouth 3 (three) times daily.   I am having Alexandra Powers start on cephALEXin. I am also having her maintain her hyoscyamine, potassium chloride SA, tizanidine, ergocalciferol, Insulin Pen Needle, ranitidine, amLODipine, hydrochlorothiazide, lisinopril, and Liraglutide -Weight Management.  Meds ordered this encounter  Medications  . cephALEXin (KEFLEX) 500 MG capsule    Sig: Take 1 capsule (500 mg total) by mouth 3 (three) times daily.    Dispense:  21 capsule    Refill:  0    Order Specific Question:   Supervising Provider    Answer:   Cassandria Anger [[id number]]    Follow-up: Return if symptoms worsen or fail to improve.  Wilfred Lacy, NP

## 2017-08-05 NOTE — Patient Instructions (Addendum)
You will be contacted to schedule mammogram and breast ultrasound.   Breast Tenderness Breast tenderness is a common problem for women of all ages. Breast tenderness may cause mild discomfort to severe pain. The pain usually comes and goes in association with your menstrual cycle, but it can be constant. Breast tenderness has many possible causes, including hormone changes and some medicines. Your health care provider may order tests, such as a mammogram or an ultrasound, to check for any unusual findings. Having breast tenderness usually does not mean that you have breast cancer. Follow these instructions at home: Sometimes, reassurance that you do not have breast cancer is all that is needed. In general, follow these home care instructions: Managing pain and discomfort  If directed, apply ice to the area: ? Put ice in a plastic bag. ? Place a towel between your skin and the bag. ? Leave the ice on for 20 minutes, 2-3 times a day.  Make sure you are wearing a supportive bra, especially during exercise. You may also want to wear a supportive bra while sleeping if your breasts are very tender. Medicines  Take over-the-counter and prescription medicines only as told by your health care provider. If the cause of your pain is infection, you may be prescribed an antibiotic medicine.  If you were prescribed an antibiotic, take it as told by your health care provider. Do not stop taking the antibiotic even if you start to feel better. General instructions  Your health care provider may recommend that you reduce the amount of fat in your diet. You can do this by: ? Limiting fried foods. ? Cooking foods using methods, such as baking, boiling, grilling, and broiling.  Decrease the amount of caffeine in your diet. You can do this by drinking more water and choosing caffeine-free options.  Keep a log of the days and times when your breasts are most tender.  Ask your health care provider how to do  breast exams at home. This will help you notice if you have an unusual growth or lump. Contact a health care provider if:  Any part of your breast is hard, red, and hot to the touch. This may be a sign of infection.  You are not breastfeeding and you have fluid, especially blood or pus, coming out of your nipples.  You have a fever.  You have a new or painful lump in your breast that remains after your menstrual period ends.  Your pain does not improve or it gets worse.  Your pain is interfering with your daily activities. This information is not intended to replace advice given to you by your health care provider. Make sure you discuss any questions you have with your health care provider. Document Released: 10/29/2008 Document Revised: 08/14/2016 Document Reviewed: 08/14/2016 Elsevier Interactive Patient Education  [employer].

## 2017-08-06 LAB — KAPPA/LAMBDA LIGHT CHAINS
IG KAPPA FREE LIGHT CHAIN: 61.9 mg/L — AB (ref 3.3–19.4)
IG LAMBDA FREE LIGHT CHAIN: 32.6 mg/L — AB (ref 5.7–26.3)
Kappa/Lambda FluidC Ratio: 1.9 — ABNORMAL HIGH (ref 0.26–1.65)

## 2017-08-09 LAB — MULTIPLE MYELOMA PANEL, SERUM
ALBUMIN SERPL ELPH-MCNC: 3.4 g/dL (ref 2.9–4.4)
ALPHA 1: 0.3 g/dL (ref 0.0–0.4)
ALPHA2 GLOB SERPL ELPH-MCNC: 0.9 g/dL (ref 0.4–1.0)
Albumin/Glob SerPl: 0.9 (ref 0.7–1.7)
B-GLOBULIN SERPL ELPH-MCNC: 1.7 g/dL — AB (ref 0.7–1.3)
Gamma Glob SerPl Elph-Mcnc: 1.1 g/dL (ref 0.4–1.8)
Globulin, Total: 4 g/dL — ABNORMAL HIGH (ref 2.2–3.9)
IgA, Qn, Serum: 479 mg/dL — ABNORMAL HIGH (ref 87–352)
IgM, Qn, Serum: 81 mg/dL (ref 26–217)
M PROTEIN SERPL ELPH-MCNC: 0.1 g/dL — AB
TOTAL PROTEIN: 7.4 g/dL (ref 6.0–8.5)

## 2017-08-11 ENCOUNTER — Other Ambulatory Visit: Payer: Self-pay | Admitting: Nurse Practitioner

## 2017-08-11 ENCOUNTER — Ambulatory Visit
Admission: RE | Admit: 2017-08-11 | Discharge: 2017-08-11 | Disposition: A | Discharge status: Home / Self Care | Payer: BLUE CROSS/BLUE SHIELD | Source: Ambulatory Visit | Attending: Nurse Practitioner | Admitting: Nurse Practitioner

## 2017-08-11 DIAGNOSIS — N6489 Other specified disorders of breast: Secondary | ICD-10-CM | POA: Diagnosis not present

## 2017-08-11 DIAGNOSIS — N6342 Unspecified lump in left breast, subareolar: Secondary | ICD-10-CM

## 2017-08-11 DIAGNOSIS — N6452 Nipple discharge: Secondary | ICD-10-CM

## 2017-08-11 DIAGNOSIS — R928 Other abnormal and inconclusive findings on diagnostic imaging of breast: Secondary | ICD-10-CM | POA: Diagnosis not present

## 2017-08-11 DIAGNOSIS — N611 Abscess of the breast and nipple: Secondary | ICD-10-CM

## 2017-08-12 ENCOUNTER — Ambulatory Visit: Payer: BLUE CROSS/BLUE SHIELD | Admitting: Hematology and Oncology

## 2017-08-14 ENCOUNTER — Emergency Department (HOSPITAL_COMMUNITY)
Admission: EM | Admit: 2017-08-14 | Discharge: 2017-08-14 | Disposition: A | Discharge status: Home / Self Care | Payer: BLUE CROSS/BLUE SHIELD | Attending: Emergency Medicine | Admitting: Emergency Medicine

## 2017-08-14 ENCOUNTER — Encounter (HOSPITAL_COMMUNITY): Payer: Self-pay | Admitting: Emergency Medicine

## 2017-08-14 DIAGNOSIS — N611 Abscess of the breast and nipple: Secondary | ICD-10-CM | POA: Insufficient documentation

## 2017-08-14 MED ORDER — OXYCODONE-ACETAMINOPHEN 5-325 MG PO TABS
1.0000 | ORAL_TABLET | Freq: Once | ORAL | Status: AC
  Administered 2017-08-14: 1 via ORAL
  Filled 2017-08-14: qty 1

## 2017-08-14 MED ORDER — LIDOCAINE HCL (PF) 1 % IJ SOLN
5.0000 mL | Freq: Once | INTRAMUSCULAR | Status: AC
  Administered 2017-08-14: 5 mL via INTRADERMAL
  Filled 2017-08-14: qty 5

## 2017-08-14 NOTE — ED Provider Notes (Signed)
[facility] DEPT Provider Note   CSN: [id number] Arrival date & time: 08/14/17  1310     History   Chief Complaint Chief Complaint  Patient presents with  . Abscess    HPI Alexandra Powers is a 46 y.o. female.  HPI  Patient presents with concern of increasing pain about her left breast. Patient has a previously diagnosed left breast abscess. She notes over the past day the pain has become intolerable, severe, sharp, focally about the left lateral.  Nipple area. No new fever, chills. Notably, the patient hashistory of similar lesions, and saw the breast center 4 days ago. At that point the patient had a needle aspiration, was started on antibiotics, and is scheduled to return there in 4 days. However, the patient notes that in spite of taking her antibiotics, and OTC medication, she has had the aforementioned pain.    Past Medical History:  Diagnosis Date  . Arthritis   . Gallstones   . Hypertension   . Obesity   . Sleep apnea     Patient Active Problem List   Diagnosis Date Noted  . OSA (obstructive sleep apnea) 12/10/2016  . GERD (gastroesophageal reflux disease) 12/04/2016  . Hypersomnia 10/29/2016  . Back pain 08/21/2016  . Vomiting 08/21/2016  . Chronic pain of multiple joints 08/13/2016  . MGUS (monoclonal gammopathy of unknown significance) 07/02/2016  . Vitamin D deficiency 07/02/2016  . Diarrhea 07/02/2016  . Joint pain 04/03/2016  . HTN (hypertension), benign 11/11/2015  . Morbid obesity ([place]) 11/11/2015  . Adrenal mass ([place]) 08/30/2015  . Ovarian cyst 08/30/2015    Past Surgical History:  Procedure Laterality Date  . CHOLECYSTECTOMY N/A 12/21/2015   Procedure: LAPAROSCOPIC CHOLECYSTECTOMY;  Surgeon: Georganna Skeans, MD;  Location: [facility];  Service: General;  Laterality: N/A;  . UMBILICAL HERNIA REPAIR N/A 12/27/2015   Procedure: INCARCERATED UMBILICAL HERNIA REPAIR;  Surgeon: Coralie Keens, MD;  Location: [facility];  Service: General;  Laterality: N/A;      OB History    No data available       Home Medications    Prior to Admission medications   Medication Sig Start Date End Date Taking? Authorizing Provider  amLODipine (NORVASC) 10 MG tablet Take 1 tablet (10 mg total) by mouth daily. 03/29/17   Binnie Rail, MD  cephALEXin (KEFLEX) 500 MG capsule Take 1 capsule (500 mg total) by mouth 3 (three) times daily. 08/05/17   Nche, Charlene Brooke, NP  ergocalciferol (VITAMIN D2) 50000 units capsule Take 1 capsule (50,000 Units total) by mouth once a week. Patient not taking: Reported on 08/05/2017 02/05/17   Binnie Rail, MD  hydrochlorothiazide (HYDRODIURIL) 25 MG tablet Take 1 tablet (25 mg total) by mouth daily. 03/29/17   Binnie Rail, MD  hyoscyamine (LEVSIN, ANASPAZ) 0.125 MG tablet Take 1 tablet (0.125 mg total) by mouth every 4 (four) hours as needed for cramping. Patient not taking: Reported on 08/05/2017 08/21/16   Binnie Rail, MD  Insulin Pen Needle (B-D ULTRAFINE III SHORT PEN) 31G X 8 MM MISC Use as directed daily with saxenda 02/22/17   Binnie Rail, MD  Liraglutide -Weight Management (SAXENDA) 18 MG/3ML SOPN Inject 3 mg into the skin daily. 07/26/17   Binnie Rail, MD  lisinopril (PRINIVIL,ZESTRIL) 30 MG tablet Take 1 tablet (30 mg total) by mouth daily. 03/29/17   Binnie Rail, MD  potassium chloride SA (K-DUR,KLOR-CON) 20 MEQ tablet Take 1 tablet (20 mEq total) by mouth daily. Patient  not taking: Reported on 08/05/2017 12/04/16   Binnie Rail, MD  ranitidine (ZANTAC) 150 MG tablet  01/01/17   [provider]  tizanidine (ZANAFLEX) 6 MG capsule Take 1 capsule (6 mg total) by mouth 3 (three) times daily as needed for muscle spasms. 12/04/16   Binnie Rail, MD    Family History Family History  Problem Relation Age of Onset  . Arthritis Mother   . Breast cancer Paternal Aunt   . Adrenal disorder Neg Hx   . Colon cancer Neg Hx   . Esophageal cancer Neg Hx   . Pancreatic cancer Neg Hx   . Stomach cancer Neg Hx   .  Liver disease Neg Hx     Social History Social History  Substance Use Topics  . Smoking status: Current Every Day Smoker    Packs/day: 0.50    Years: 24.00    Types: Cigarettes  . Smokeless tobacco: Never Used  . Alcohol use 1.2 oz/week    2 Shots of liquor per week     Comment: per week     Allergies   Patient has no known allergies.   Review of Systems Review of Systems  Constitutional:       Per HPI, otherwise negative  HENT:       Per HPI, otherwise negative  Respiratory:       Per HPI, otherwise negative  Cardiovascular:       Per HPI, otherwise negative  Gastrointestinal: Negative for vomiting.  Endocrine:       Negative aside from HPI  Genitourinary:       Neg aside from HPI   Musculoskeletal:       Per HPI, otherwise negative  Skin: Positive for wound.  Neurological: Negative for syncope.     Physical Exam Updated Vital Signs BP 108/79   Pulse 92   Temp 99 F (37.2 C) (Oral)   Resp 16   LMP 07/14/2017   SpO2 95%   Physical Exam  Constitutional: She is oriented to person, place, and time. She appears well-developed and well-nourished. No distress.  HENT:  Head: Normocephalic and atraumatic.  Eyes: Conjunctivae and EOM are normal.  Cardiovascular: Normal rate and regular rhythm.   Pulmonary/Chest: Effort normal and breath sounds normal. No stridor. No respiratory distress.  Abdominal: She exhibits no distension.  Musculoskeletal: She exhibits no edema.  Neurological: She is alert and oriented to person, place, and time. No cranial nerve deficit.  Skin: Skin is warm and dry.     Psychiatric: She has a normal mood and affect.  Nursing note and vitals reviewed.    ED Treatments / Results  Labs (all labs ordered are listed, but only abnormal results are displayed) Labs Reviewed - No data to display  EKG  EKG Interpretation None       Radiology No results found.  Procedures .Marland KitchenIncision and Drainage Date/Time: 08/14/2017 5:49  PM Performed by: Carmin Muskrat Authorized by: Carmin Muskrat   Consent:    Consent obtained:  Verbal   Consent given by:  Patient   Risks discussed:  Incomplete drainage, damage to other organs, pain and infection   Alternatives discussed:  No treatment, delayed treatment and alternative treatment Location:    Type:  Abscess   Size:  5cm   Location:  Trunk   Trunk location:  L breast Pre-procedure details:    Skin preparation:  Chloraprep Anesthesia (see MAR for exact dosages):    Anesthesia method:  Local infiltration  Local anesthetic:  Lidocaine 1% w/o epi Procedure type:    Complexity:  Complex Procedure details:    Needle aspiration: no     Incision types:  Single straight   Incision depth:  Subcutaneous   Scalpel blade:  11   Wound management:  Probed and deloculated, irrigated with saline and extensive cleaning   Drainage:  Purulent   Drainage amount:  Copious   Wound treatment:  Drain placed   Packing materials:  1/4 in gauze   Amount 1/4":  12 Post-procedure details:    Patient tolerance of procedure:  Tolerated well, no immediate complications   (including critical care time)  Medications Ordered in ED Medications  lidocaine (PF) (XYLOCAINE) 1 % injection 5 mL (5 mLs Intradermal Given by Other 08/14/17 1630)  oxyCODONE-acetaminophen (PERCOCET/ROXICET) 5-325 MG per tablet 1 tablet (1 tablet Oral Given 08/14/17 1630)     Initial Impression / Assessment and Plan / ED Course  I have reviewed the triage vital signs and the nursing notes.  Pertinent labs & imaging results that were available during my care of the patient were reviewed by me and considered in my medical decision making (see chart for details).  Patient with breast abscess presents with increasing pain about the abscess area. Patient is already on antibiotics, and will continue to take them. I had a lengthy conversation about risks and benefits of performing incision and drainage in a breast  abscess, particularly discussing cosmetic effects. Patient requests pain relief, states that she understands risks of incision and drainage, and patient then had successful drainage of substantial amounts of purulent discharge from her abscess. Patient already has outpatient follow-up scheduled, was encouraged to have one wound check performed in the interim. With no evidence for bacteremia or sepsis, the patient was discharged in stable condition.  Final Clinical Impressions(s) / ED Diagnoses   Final diagnoses:  Abscess of breast, left    New Prescriptions New Prescriptions   No medications on file     Carmin Muskrat, MD 08/14/17 1751

## 2017-08-14 NOTE — ED Triage Notes (Addendum)
Pt states abscess to breast, area near nipple, about 34mm wide. No active drainage. Pt sates hx of same and had to have it drained and packed. Denies hx of breast cancer.

## 2017-08-14 NOTE — Discharge Instructions (Signed)
As discussed, it is very important that you monitor your condition carefully, and have your abscess evaluated in 2 days, and again as scheduled on Wednesday.  Return here for concerning changes in your condition.

## 2017-08-14 NOTE — ED Notes (Signed)
ED Provider at bedside. 

## 2017-08-16 ENCOUNTER — Other Ambulatory Visit: Payer: BLUE CROSS/BLUE SHIELD

## 2017-08-16 LAB — AEROBIC/ANAEROBIC CULTURE (SURGICAL/DEEP WOUND)

## 2017-08-16 LAB — AEROBIC/ANAEROBIC CULTURE W GRAM STAIN (SURGICAL/DEEP WOUND)

## 2017-08-18 ENCOUNTER — Ambulatory Visit
Admission: RE | Admit: 2017-08-18 | Discharge: 2017-08-18 | Disposition: A | Discharge status: Home / Self Care | Payer: BLUE CROSS/BLUE SHIELD | Source: Ambulatory Visit | Attending: Nurse Practitioner | Admitting: Nurse Practitioner

## 2017-08-18 ENCOUNTER — Other Ambulatory Visit: Payer: Self-pay | Admitting: Nurse Practitioner

## 2017-08-18 DIAGNOSIS — N611 Abscess of the breast and nipple: Secondary | ICD-10-CM

## 2017-08-18 DIAGNOSIS — N6489 Other specified disorders of breast: Secondary | ICD-10-CM | POA: Diagnosis not present

## 2017-08-31 ENCOUNTER — Encounter: Payer: Self-pay | Admitting: Internal Medicine

## 2017-08-31 ENCOUNTER — Ambulatory Visit (INDEPENDENT_AMBULATORY_CARE_PROVIDER_SITE_OTHER): Payer: BLUE CROSS/BLUE SHIELD | Admitting: Internal Medicine

## 2017-08-31 VITALS — BP 132/94 | HR 95 | Temp 98.2°F | Resp 16 | Wt 299.0 lb

## 2017-08-31 DIAGNOSIS — Z23 Encounter for immunization: Secondary | ICD-10-CM

## 2017-08-31 DIAGNOSIS — H5789 Other specified disorders of eye and adnexa: Secondary | ICD-10-CM

## 2017-08-31 NOTE — Progress Notes (Signed)
Subjective:    [name], female    DOB: [birth year], 46 y.o.   MRN: [id number]  HPI She is here for an acute visit.   3 days ago she felt like something was in her eye and was rubbing it. She thinks eye liner or mascara may have gotten in the eye.  That night the eye felt irritated, but she thinks it looked normal.  The next day, two days ago, the eye was red and the eye lid was swollen.  The eye has been watering.  The eyeball is sore, she has blurriness from the watering and feeling like something is in the eye.  She has been using visine and tried flushing it.  Today when she was using it it caused burning in the eye.      Medications and allergies reviewed with patient and updated if appropriate.  Patient Active Problem List   Diagnosis Date Noted  . OSA (obstructive sleep apnea) 12/10/2016  . GERD (gastroesophageal reflux disease) 12/04/2016  . Hypersomnia 10/29/2016  . Back pain 08/21/2016  . Vomiting 08/21/2016  . Chronic pain of multiple joints 08/13/2016  . MGUS (monoclonal gammopathy of unknown significance) 07/02/2016  . Vitamin D deficiency 07/02/2016  . Diarrhea 07/02/2016  . Joint pain 04/03/2016  . HTN (hypertension), benign 11/11/2015  . Morbid obesity ([place]) 11/11/2015  . Adrenal mass ([place]) 08/30/2015  . Ovarian cyst 08/30/2015    Current Outpatient Prescriptions on File Prior to Visit  Medication Sig Dispense Refill  . amLODipine (NORVASC) 10 MG tablet Take 1 tablet (10 mg total) by mouth daily. 90 tablet 3  . DULoxetine (CYMBALTA) 30 MG capsule     . ergocalciferol (VITAMIN D2) 50000 units capsule Take 1 capsule (50,000 Units total) by mouth once a week. 12 capsule 0  . hydrochlorothiazide (HYDRODIURIL) 25 MG tablet Take 1 tablet (25 mg total) by mouth daily. 90 tablet 3  . hyoscyamine (LEVSIN, ANASPAZ) 0.125 MG tablet Take 1 tablet (0.125 mg total) by mouth every 4 (four) hours as needed for cramping. 90 tablet 5  . Insulin Pen Needle (B-D  ULTRAFINE III SHORT PEN) 31G X 8 MM MISC Use as directed daily with saxenda 30 each 5  . Liraglutide -Weight Management (SAXENDA) 18 MG/3ML SOPN Inject 3 mg into the skin daily. 3 pen 5  . lisinopril (PRINIVIL,ZESTRIL) 30 MG tablet Take 1 tablet (30 mg total) by mouth daily. 90 tablet 3  . meloxicam (MOBIC) 7.5 MG tablet     . ranitidine (ZANTAC) 150 MG tablet     . tizanidine (ZANAFLEX) 6 MG capsule Take 1 capsule (6 mg total) by mouth 3 (three) times daily as needed for muscle spasms. 90 capsule 5  . traMADol (ULTRAM) 50 MG tablet     . [DISCONTINUED] losartan-hydrochlorothiazide (HYZAAR) 50-12.5 MG per tablet Take 1 tablet by mouth daily. 30 tablet 5   No current facility-administered medications on file prior to visit.     Past Medical History:  Diagnosis Date  . Arthritis   . Gallstones   . Hypertension   . Obesity   . Sleep apnea     Past Surgical History:  Procedure Laterality Date  . CHOLECYSTECTOMY N/A 12/21/2015   Procedure: LAPAROSCOPIC CHOLECYSTECTOMY;  Surgeon: Georganna Skeans, MD;  Location: [facility];  Service: General;  Laterality: N/A;  . UMBILICAL HERNIA REPAIR N/A 12/27/2015   Procedure: INCARCERATED UMBILICAL HERNIA REPAIR;  Surgeon: Coralie Keens, MD;  Location: [facility];  Service: General;  Laterality: N/A;    Social History   Social History  . Marital status: Married    Spouse name: N/A  . Number of children: 2  . Years of education: N/A   Occupational History  .      unemploed   Social History Main Topics  . Smoking status: Current Every Day Smoker    Packs/day: 0.50    Years: 24.00    Types: Cigarettes  . Smokeless tobacco: Never Used  . Alcohol use 1.2 oz/week    2 Shots of liquor per week     Comment: per week  . Drug use: Yes    Types: Marijuana  . Sexual activity: Not on file   Other Topics Concern  . Not on file   Social History Narrative   Lives with husband, Berneta Sages.    Family History  Problem Relation Age of Onset  . Arthritis  Mother   . Breast cancer Paternal Aunt   . Adrenal disorder Neg Hx   . Colon cancer Neg Hx   . Esophageal cancer Neg Hx   . Pancreatic cancer Neg Hx   . Stomach cancer Neg Hx   . Liver disease Neg Hx     Review of Systems  Eyes: Positive for photophobia, pain, discharge (watering), redness, itching and visual disturbance (only from watering).       Objective:   Vitals:   08/31/17 1613  BP: (!) 132/94  Pulse: 95  Resp: 16  Temp: 98.2 F (36.8 C)  SpO2: 98%   Filed Weights   08/31/17 1613  Weight: 299 lb (135.6 kg)   Body mass index is 45.46 kg/m.  Wt Readings from Last 3 Encounters:  08/31/17 299 lb (135.6 kg)  08/05/17 (!) 300 lb 12.8 oz (136.4 kg)  03/29/17 (!) 347 lb (157.4 kg)     Physical Exam  Constitutional: She appears well-developed and well-nourished. No distress.  HENT:  Head: Normocephalic and atraumatic.  Eyes:  Left conjunctiva with diffuse erythema, watery discharge, EOMi b/l but pain on medial aspect of left eye with movement, left upper eyelid swelling, no visibile foreign object  Skin: Skin is warm and dry. No rash noted. She is not diaphoretic. No erythema.          Assessment & Plan:   See Problem List for Assessment and Plan of chronic medical problems.

## 2017-08-31 NOTE — Patient Instructions (Signed)
You need to see an eye doctor as soon as possible.

## 2017-08-31 NOTE — Assessment & Plan Note (Signed)
Symptoms more severe and not consistent with conjunctivitis Concern for scleritis or episcleritis Needs to see ophthalmology asap - will order an urgent referral since she does not have an eye doctor - advised her to try calling tonight to get an appointment as well

## 2017-09-01 ENCOUNTER — Other Ambulatory Visit: Payer: Self-pay | Admitting: Nurse Practitioner

## 2017-09-01 ENCOUNTER — Ambulatory Visit
Admission: RE | Admit: 2017-09-01 | Discharge: 2017-09-01 | Disposition: A | Discharge status: Home / Self Care | Payer: BLUE CROSS/BLUE SHIELD | Source: Ambulatory Visit | Attending: Nurse Practitioner | Admitting: Nurse Practitioner

## 2017-09-01 DIAGNOSIS — N6321 Unspecified lump in the left breast, upper outer quadrant: Secondary | ICD-10-CM | POA: Diagnosis not present

## 2017-09-01 DIAGNOSIS — N611 Abscess of the breast and nipple: Secondary | ICD-10-CM

## 2017-09-02 ENCOUNTER — Telehealth: Payer: Self-pay | Admitting: Hematology and Oncology

## 2017-09-02 ENCOUNTER — Ambulatory Visit (HOSPITAL_BASED_OUTPATIENT_CLINIC_OR_DEPARTMENT_OTHER): Payer: BLUE CROSS/BLUE SHIELD | Admitting: Hematology and Oncology

## 2017-09-02 DIAGNOSIS — N179 Acute kidney failure, unspecified: Secondary | ICD-10-CM

## 2017-09-02 DIAGNOSIS — E876 Hypokalemia: Secondary | ICD-10-CM

## 2017-09-02 DIAGNOSIS — D472 Monoclonal gammopathy: Secondary | ICD-10-CM

## 2017-09-02 DIAGNOSIS — B308 Other viral conjunctivitis: Secondary | ICD-10-CM | POA: Diagnosis not present

## 2017-09-02 NOTE — Telephone Encounter (Signed)
Scheduled appt per 10/4 los - sent reminder letter in the mail. Lab and f/u in one year

## 2017-09-03 ENCOUNTER — Encounter: Payer: Self-pay | Admitting: Hematology and Oncology

## 2017-09-03 DIAGNOSIS — N179 Acute kidney failure, unspecified: Secondary | ICD-10-CM | POA: Insufficient documentation

## 2017-09-03 DIAGNOSIS — E876 Hypokalemia: Secondary | ICD-10-CM | POA: Insufficient documentation

## 2017-09-03 NOTE — Assessment & Plan Note (Signed)
She has mild renal failure, likely due to diuretic therapy I recommend discontinuation of hydrochlorothiazide and appointment to see her primary care doctor for medication management

## 2017-09-03 NOTE — Progress Notes (Signed)
[facility] OFFICE PROGRESS NOTE  Patient Care Team: Binnie Rail, MD as PCP - General (Internal Medicine) Hennie Duos, MD as Consulting Physician (Rheumatology)  SUMMARY OF ONCOLOGIC HISTORY:  Alexandra Powers is here because of chronic joint pain and abnormal protein electrophoresis. The patient had problems with chronic back pain. She denies prior history of trauma. The pain has been present for over a year. She was prescribed intermittent doses of NSAID and Vicodin for this. The pains is located in the shoulder, the back, and the knees with associated morning stiffness. The patient had cholecystectomy several months ago and since then had postprandial nausea, vomiting usually within 30 minutes and associated diarrhea 4-5 times a day with abdominal cramps. She have lost almost 40 pounds over the past few months. She was subsequently referred to rheumatologist. On 05/20/2016, blood work show high sedimentation rate of 67, C-reactive protein of 11.9, abnormal serum protein electrophoresis with detectable M spike but negative autoimmune screen with ANA. Hepatitis screen was negative. She denies history of abnormal bone fracture. Patient denies history of recurrent infection or atypical infections such as shingles of meningitis. Denies chills, night sweats or anorexia  In August 2017, she underwent extensive workup which excluded multiple myeloma. X-ray is most consistent with osteoarthritis and she have IgG lambda MGUS with polyclonal IgA  INTERVAL HISTORY: Please see below for problem oriented charting. She feels well. She continues to complain of diffuse bone pain Denies recent bone fracture No recent recurrent infection or shingles  REVIEW OF SYSTEMS:   Constitutional: Denies fevers, chills or abnormal weight loss Eyes: Denies blurriness of vision Ears, nose, mouth, throat, and face: Denies mucositis or sore throat Respiratory: Denies cough, dyspnea or  wheezes Cardiovascular: Denies palpitation, chest discomfort or lower extremity swelling Gastrointestinal:  Denies nausea, heartburn or change in bowel habits Skin: Denies abnormal skin rashes Lymphatics: Denies new lymphadenopathy or easy bruising Neurological:Denies numbness, tingling or new weaknesses Behavioral/Psych: Mood is stable, no new changes  All other systems were reviewed with the patient and are negative.  I have reviewed the past medical history, past surgical history, social history and family history with the patient and they are unchanged from previous note.  ALLERGIES:  has No Known Allergies.  MEDICATIONS:  Current Outpatient Prescriptions  Medication Sig Dispense Refill  . amLODipine (NORVASC) 10 MG tablet Take 1 tablet (10 mg total) by mouth daily. 90 tablet 3  . DULoxetine (CYMBALTA) 30 MG capsule     . ergocalciferol (VITAMIN D2) 50000 units capsule Take 1 capsule (50,000 Units total) by mouth once a week. 12 capsule 0  . hydrochlorothiazide (HYDRODIURIL) 25 MG tablet Take 1 tablet (25 mg total) by mouth daily. 90 tablet 3  . hyoscyamine (LEVSIN, ANASPAZ) 0.125 MG tablet Take 1 tablet (0.125 mg total) by mouth every 4 (four) hours as needed for cramping. 90 tablet 5  . Insulin Pen Needle (B-D ULTRAFINE III SHORT PEN) 31G X 8 MM MISC Use as directed daily with saxenda 30 each 5  . Liraglutide -Weight Management (SAXENDA) 18 MG/3ML SOPN Inject 3 mg into the skin daily. 3 pen 5  . lisinopril (PRINIVIL,ZESTRIL) 30 MG tablet Take 1 tablet (30 mg total) by mouth daily. 90 tablet 3  . meloxicam (MOBIC) 7.5 MG tablet     . ranitidine (ZANTAC) 150 MG tablet     . tizanidine (ZANAFLEX) 6 MG capsule Take 1 capsule (6 mg total) by mouth 3 (three) times daily as needed for muscle spasms.  90 capsule 5  . traMADol (ULTRAM) 50 MG tablet      No current facility-administered medications for this visit.     PHYSICAL EXAMINATION: ECOG PERFORMANCE STATUS: 1 - Symptomatic but  completely ambulatory  Vitals:   09/02/17 1329  BP: 108/77  Pulse: 88  Resp: 20  Temp: 98.4 F (36.9 C)  SpO2: 100%   Filed Weights   09/02/17 1329  Weight: 296 lb 11.2 oz (134.6 kg)    GENERAL:alert, no distress and comfortable SKIN: skin color, texture, turgor are normal, no rashes or significant lesions EYES: normal, Conjunctiva are pink and non-injected, sclera clear Musculoskeletal:no cyanosis of digits and no clubbing  NEURO: alert & oriented x 3 with fluent speech, no focal motor/sensory deficits  LABORATORY DATA:  I have reviewed the data as listed    Component Value Date/Time   NA 142 08/05/2017 1057   K 2.7 (LL) 08/05/2017 1057   CL 103 12/04/2016 1619   CO2 33 (H) 08/05/2017 1057   GLUCOSE 103 08/05/2017 1057   BUN 31.2 (H) 08/05/2017 1057   CREATININE 1.8 (H) 08/05/2017 1057   CALCIUM 10.4 08/05/2017 1057   PROT 8.3 08/05/2017 1057   PROT 7.4 08/05/2017 1057   ALBUMIN 3.2 (L) 08/05/2017 1057   AST 18 08/05/2017 1057   ALT 10 08/05/2017 1057   ALKPHOS 62 08/05/2017 1057   BILITOT 0.46 08/05/2017 1057   GFRNONAA 60 (L) 12/26/2015 2351   GFRNONAA 83 08/29/2015 1457   GFRAA >60 12/26/2015 2351   GFRAA >89 08/29/2015 1457    No results found for: SPEP, UPEP  Lab Results  Component Value Date   WBC 13.5 (H) 08/05/2017   NEUTROABS 8.9 (H) 08/05/2017   HGB 12.8 08/05/2017   HCT 39.2 08/05/2017   MCV 99.7 08/05/2017   PLT 354 08/05/2017      Chemistry      Component Value Date/Time   NA 142 08/05/2017 1057   K 2.7 (LL) 08/05/2017 1057   CL 103 12/04/2016 1619   CO2 33 (H) 08/05/2017 1057   BUN 31.2 (H) 08/05/2017 1057   CREATININE 1.8 (H) 08/05/2017 1057      Component Value Date/Time   CALCIUM 10.4 08/05/2017 1057   ALKPHOS 62 08/05/2017 1057   AST 18 08/05/2017 1057   ALT 10 08/05/2017 1057   BILITOT 0.46 08/05/2017 1057       RADIOGRAPHIC STUDIES: I have personally reviewed the radiological images as listed and agreed with the  findings in the report. US Breast Ltd Uni Left Inc Axilla  Result Date: 09/01/2017 CLINICAL DATA:  46 year old female presenting for re-evaluation of a left breast abscess status post incision and drainage and antibiotic treatment. Patient reports marked interval improvement in her symptoms including decreasing size of the subareolar mass with resolution of the overlying skin redness and edema. EXAM: ULTRASOUND OF THE LEFT BREAST COMPARISON:  Previous exam(s). FINDINGS: On physical exam, a 1-2 cm firm nodules identified in the left subareolar region. There is no significant overlying skin thickening, redness or edema. Targeted ultrasound is performed, showing an ill-defined, hypoechoic mass at the 2 o'clock subareolar position. It currently measures 2 x 1.8 x 0.7 cm. There is no significant internal vascularity. Note is made of mild overlying skin thickening. The overall appearance is markedly improved from prior examination likely represents treated infection with residual inflammatory changes. The second heterogenous collection identified at the 4 o'clock position 2 cm from the nipple is no longer identified. IMPRESSION: Interval decrease in  size of the patient's left subareolar heterogenous collection, likely representing treated infection with residual inflammatory changes. Recommendation is for ultrasound follow-up in 4 weeks to document continued improvement/resolution. RECOMMENDATION: Left breast ultrasound in 4 weeks. I have discussed the findings and recommendations with the patient. Results were also provided in writing at the conclusion of the visit. If applicable, a reminder letter will be sent to the patient regarding the next appointment. BI-RADS CATEGORY  3: Probably benign. Electronically Signed   By: Kristopher Oppenheim M.D.   On: 09/01/2017 10:39   US Breast Ltd Uni Left Inc Axilla  Result Date: 08/18/2017 CLINICAL DATA:  Followup for left breast infection. Patient had aspiration of an abscess on  08/11/2017. This revealed Staphylococcus Lugdunensis, sensitive to multiple Class of antibiotics. She was placed on doxycycline. She density improved, but symptoms worsened, for which she went to the emergency room 3 days ago. Incision and drainage was performed in the emergency room. She is currently improved, but still has swelling and discomfort in the left breast. EXAM: ULTRASOUND OF THE LEFT BREAST COMPARISON:  08/11/2017 FINDINGS: On physical exam, there is a raised firm area in the upper outer retroareolar region, with an overlying skin incision reflecting the abscess site and the recent incision performed in the emergency room. Targeted ultrasound is performed, showing a heterogeneous hypoechoic area with irregular margins extending from the nipple towards the upper outer left breast, centered on 2 o'clock retroareolar region. There is some anechoic interval centrally, predominantly this is soft tissue appearance. Measures approximately 3.4 x 1.1 x 1.9 cm. This represents the area of the aspirated abscess seen on the prior study, which previously measured 6 x 2.1 x 4.5 cm. In the 4 o'clock position, 2 cm the nipple, there is a small heterogeneous hypoechoic lesion consistent with a complicated fluid collection measuring 11 x 5 x 8 mm. The fat surrounding these areas is hyperechoic consistent with inflammation. IMPRESSION: 1. Improved left breast abscess and mastitis, without resolution. There is no current drainable abscess. RECOMMENDATION: 1. Patient will be placed on a 10 day course of Augmentin, 875 mg, 1 p.o. b.i.d. for 10 days. 2. Patient will be reassessed in 14 days. Patient instructed to return earlier if symptoms worsen. I have discussed the findings and recommendations with the patient. Results were also provided in writing at the conclusion of the visit. If applicable, a reminder letter will be sent to the patient regarding the next appointment. BI-RADS CATEGORY  2: Benign. Electronically Signed    By: Lajean Manes M.D.   On: 08/18/2017 10:41   US Breast Ltd Uni Left Inc Axilla  Result Date: 08/11/2017 CLINICAL DATA:  46 year old female with left subareolar pain and lump for approximately 1 week. The patient does not report any systemic symptoms such is fevers or chills. She had a single episode of vomiting at the time of mammogram. Vital signs were obtained and were within normal limits. The patient appeared comfortable at the time of examination. She saw her primary care physician approximately 4-5 days ago. She was given a prescription for Keflex, but discontinued it due to diarrhea. EXAM: 2D DIGITAL DIAGNOSTIC BILATERAL MAMMOGRAM WITH CAD AND ADJUNCT TOMO ULTRASOUND LEFT BREAST COMPARISON:  Previous exam(s). ACR Breast Density Category b: There are scattered areas of fibroglandular density. FINDINGS: Please note, the patient could not tolerate optimal positioning due to body habitus and pain. The best possible images were obtained. An irregular mass with associated trabecular thickening is noted in the left subareolar region at  the site of the patient's palpable abnormality. There is associated nipple retraction. Additional evaluation with ultrasound was performed. No mammographic evidence of malignancy on the right. Mammographic images were processed with CAD. On physical exam, I palpate a firm 3-4 cm lump in the left subareolar region. Targeted ultrasound is performed, showing a heterogenous fluid collection at the 12 o'clock subareolar position. It measures 4.5 x 2.1 x 6 cm. There is suggestion of multiple small pockets of drainable fluid. Note is made of peripheral vascularity and skin thickening up to 1.5 cm. IMPRESSION: 1. Left breast abscess for which subsequent ultrasound-guided drainage was performed. Please see additional dictation. The patient was given a prescription for doxycycline given her intolerant of Keflex. She is scheduled to return in 5-7 business days for re-evaluation and  possible re- aspiration as needed. 2. No mammographic evidence of malignancy on the right. RECOMMENDATION: 1. Ultrasound-guided drainage of the left breast abscess. This was performed to follow. Please see additional dictation. 2. The patient was given a prescription for doxycycline and is scheduled to return in 5-7 business days for re-evaluation and possible re- aspiration is needed. I have discussed the findings and recommendations with the patient. Results were also provided in writing at the conclusion of the visit. If applicable, a reminder letter will be sent to the patient regarding the next appointment. BI-RADS CATEGORY  3: Probably benign. Electronically Signed   By: Kristopher Oppenheim M.D.   On: 08/11/2017 17:06   Mm Diag Breast Tomo Bilateral  Result Date: 08/11/2017 CLINICAL DATA:  46 year old female with left subareolar pain and lump for approximately 1 week. The patient does not report any systemic symptoms such is fevers or chills. She had a single episode of vomiting at the time of mammogram. Vital signs were obtained and were within normal limits. The patient appeared comfortable at the time of examination. She saw her primary care physician approximately 4-5 days ago. She was given a prescription for Keflex, but discontinued it due to diarrhea. EXAM: 2D DIGITAL DIAGNOSTIC BILATERAL MAMMOGRAM WITH CAD AND ADJUNCT TOMO ULTRASOUND LEFT BREAST COMPARISON:  Previous exam(s). ACR Breast Density Category b: There are scattered areas of fibroglandular density. FINDINGS: Please note, the patient could not tolerate optimal positioning due to body habitus and pain. The best possible images were obtained. An irregular mass with associated trabecular thickening is noted in the left subareolar region at the site of the patient's palpable abnormality. There is associated nipple retraction. Additional evaluation with ultrasound was performed. No mammographic evidence of malignancy on the right. Mammographic  images were processed with CAD. On physical exam, I palpate a firm 3-4 cm lump in the left subareolar region. Targeted ultrasound is performed, showing a heterogenous fluid collection at the 12 o'clock subareolar position. It measures 4.5 x 2.1 x 6 cm. There is suggestion of multiple small pockets of drainable fluid. Note is made of peripheral vascularity and skin thickening up to 1.5 cm. IMPRESSION: 1. Left breast abscess for which subsequent ultrasound-guided drainage was performed. Please see additional dictation. The patient was given a prescription for doxycycline given her intolerant of Keflex. She is scheduled to return in 5-7 business days for re-evaluation and possible re- aspiration as needed. 2. No mammographic evidence of malignancy on the right. RECOMMENDATION: 1. Ultrasound-guided drainage of the left breast abscess. This was performed to follow. Please see additional dictation. 2. The patient was given a prescription for doxycycline and is scheduled to return in 5-7 business days for re-evaluation and possible re- aspiration  is needed. I have discussed the findings and recommendations with the patient. Results were also provided in writing at the conclusion of the visit. If applicable, a reminder letter will be sent to the patient regarding the next appointment. BI-RADS CATEGORY  3: Probably benign. Electronically Signed   By: Kristopher Oppenheim M.D.   On: 08/11/2017 17:06   US Breast Aspiration Left  Addendum Date: 08/18/2017   ADDENDUM REPORT: 08/18/2017 14:12 ADDENDUM: Left breast aspiration yielded Gram Stain-ABUNDANT WBC PRESENT, PREDOMINANTLY PMN, ABUNDANT GRAM POSITIVE COCCI IN PAIRS, ABUNDANT GRAM NEGATIVE COCCOBACILLI Culture-ABUNDANT STAPHYLOCOCCUS LUGDUNENSIS, MIXED ANAEROBIC FLORA PRESENT. The patient was notified of results in person by Dr. Lajean Manes. The patient returned on August 18, 2017 for a left breast ultrasound. The results from that procedure are dictated in a separate  report. The patient is scheduled to return to [facility] on September 01, 2017 for a Left breast ultrasound with possible aspiration to ensure continued resolution and healing of the abscess. She was instructed to call for any additional questions or concerns. Pathology results reported by Terie Purser, RN on 08/18/2017. Electronically Signed   By: Fidela [facility] M.D.   On: 08/18/2017 14:12   Result Date: 08/18/2017 CLINICAL DATA:  Left breast o'clock subareolar abscess. EXAM: ULTRASOUND GUIDED LEFT BREAST CYST ASPIRATION COMPARISON:  Previous exams. PROCEDURE: Using sterile technique, 1% lidocaine, under direct ultrasound visualization, needle aspiration of left breast o'clock subareolar abscess was performed. 4 cc of phlegmonous fluid was aspirated and sent to microbiology. IMPRESSION: Ultrasound-guided aspiration of left breast No apparent complications. RECOMMENDATIONS: The patient is being scheduled to return in 5-7 business days for an ultrasound follow-up of her left breast abscess. She is also being given a prescription for oral antibiotic treatment. Electronically Signed: By: Fidela [facility] M.D. On: 08/11/2017 16:22    ASSESSMENT & PLAN:  MGUS (monoclonal gammopathy of unknown significance) Her blood work is most consistent with MGUS. I discussed with her the natural history of MGUS. Her bone pain is not related to this. Recent blood work showed no significant signs of disease progression She would need close follow-up once a year with history, blood work and examination. She agreed  Hypokalemia She had recurrent hypokalemia, likely related to her medication I recommend discussion with her primary care doctor to consider discontinuation of hydrochlorothiazide  Acute prerenal failure ([place]) She has mild renal failure, likely due to diuretic therapy I recommend discontinuation of hydrochlorothiazide and appointment to see her primary care doctor for  medication management   No orders of the defined types were placed in this encounter.  All questions were answered. The patient knows to call the clinic with any problems, questions or concerns. No barriers to learning was detected. I spent 10 minutes counseling the patient face to face. The total time spent in the appointment was 15 minutes and more than 50% was on counseling and review of test results     Heath Lark, MD 09/03/2017 4:20 PM

## 2017-09-03 NOTE — Assessment & Plan Note (Signed)
She had recurrent hypokalemia, likely related to her medication I recommend discussion with her primary care doctor to consider discontinuation of hydrochlorothiazide

## 2017-09-03 NOTE — Assessment & Plan Note (Signed)
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 I discussed with her the natural history of MGUS. Her bone pain is not related to this. Recent blood work showed no significant signs of disease progression She would need close follow-up once a year with history, blood work and examination. She agreed 

## 2017-09-06 ENCOUNTER — Telehealth: Payer: Self-pay | Admitting: Internal Medicine

## 2017-09-06 ENCOUNTER — Telehealth: Payer: Self-pay | Admitting: *Deleted

## 2017-09-06 DIAGNOSIS — I10 Essential (primary) hypertension: Secondary | ICD-10-CM

## 2017-09-06 MED ORDER — LISINOPRIL 40 MG PO TABS: 40.0000 mg | ORAL_TABLET | Freq: Every day | ORAL | 3 refills | Status: DC

## 2017-09-06 NOTE — Telephone Encounter (Signed)
Spoke with pt to inform. RX sent to [facility]

## 2017-09-06 NOTE — Telephone Encounter (Signed)
Dr Quay Burow notified of message below. CMET results

## 2017-09-06 NOTE — Telephone Encounter (Signed)
-----   Message from Heath Lark, MD sent at 09/03/2017  4:21 PM EDT ----- Regarding: abnromal labs Pls call PCP office next week to alert them of her abnormal CMET, need to consider medication changes for her BP

## 2017-09-06 NOTE — Telephone Encounter (Signed)
States that patient has had an abnormal B Met.  States that potassium was 2.7 Suggest that Dr. Quay Burow change patients BP meds.

## 2017-09-06 NOTE — Telephone Encounter (Signed)
Stop hctz.    Increase lisinopril to 40 mg daily.  Pending - send to desired pharmacy.   Bmp in one week.    Has f/u appt with me on 10/24 and we will recheck her BP then.

## 2017-09-07 ENCOUNTER — Telehealth: Payer: Self-pay | Admitting: Internal Medicine

## 2017-09-07 NOTE — Telephone Encounter (Signed)
Pt notified and understood, will call if she have BP issues.

## 2017-09-07 NOTE — Telephone Encounter (Signed)
Yes she needs to take all of them and monitor her bp

## 2017-09-07 NOTE — Telephone Encounter (Signed)
Pt called and states she is on 3 BP medications and Dr Quay Burow prescribed her a new one, she would like to know if she is supposed to take all three. Please advise and call back

## 2017-09-13 DIAGNOSIS — M25562 Pain in left knee: Secondary | ICD-10-CM | POA: Diagnosis not present

## 2017-09-13 DIAGNOSIS — M25561 Pain in right knee: Secondary | ICD-10-CM | POA: Diagnosis not present

## 2017-09-13 DIAGNOSIS — G8929 Other chronic pain: Secondary | ICD-10-CM | POA: Diagnosis not present

## 2017-09-13 DIAGNOSIS — M170 Bilateral primary osteoarthritis of knee: Secondary | ICD-10-CM | POA: Diagnosis not present

## 2017-09-17 ENCOUNTER — Other Ambulatory Visit (INDEPENDENT_AMBULATORY_CARE_PROVIDER_SITE_OTHER): Payer: BLUE CROSS/BLUE SHIELD

## 2017-09-17 DIAGNOSIS — I10 Essential (primary) hypertension: Secondary | ICD-10-CM

## 2017-09-17 LAB — BASIC METABOLIC PANEL
BUN: 18 mg/dL (ref 6–23)
CALCIUM: 9.2 mg/dL (ref 8.4–10.5)
CO2: 28 meq/L (ref 19–32)
Chloride: 102 mEq/L (ref 96–112)
Creatinine, Ser: 1.29 mg/dL — ABNORMAL HIGH (ref 0.40–1.20)
GFR: 56.9 mL/min — ABNORMAL LOW (ref >60.00)
GLUCOSE: 91 mg/dL (ref 70–99)
Potassium: 3.3 mEq/L — ABNORMAL LOW (ref 3.5–5.1)
SODIUM: 139 meq/L (ref 135–145)

## 2017-09-19 ENCOUNTER — Encounter: Payer: Self-pay | Admitting: Internal Medicine

## 2017-09-20 ENCOUNTER — Other Ambulatory Visit: Payer: Self-pay | Admitting: Internal Medicine

## 2017-09-20 ENCOUNTER — Other Ambulatory Visit: Payer: Self-pay | Admitting: Nurse Practitioner

## 2017-09-20 ENCOUNTER — Telehealth: Payer: Self-pay | Admitting: Internal Medicine

## 2017-09-20 DIAGNOSIS — N611 Abscess of the breast and nipple: Secondary | ICD-10-CM

## 2017-09-20 NOTE — Telephone Encounter (Signed)
B/l [place] of breasts ordered - let her know and please let Mary/ Cecille Rubin know

## 2017-09-20 NOTE — Telephone Encounter (Signed)
[name] aware of orders.

## 2017-09-20 NOTE — Telephone Encounter (Signed)
Patient called stating that Dr. Quay Burow had referred her for a cyst on her left breast to the breast center to be drained.  Patient states she has another one coming up on her left breast and that the right one is coming back again.  States the breast center needs a referral to drain the right breat.  Can this be done without patient coming in to be seen again?

## 2017-09-21 ENCOUNTER — Ambulatory Visit
Admission: RE | Admit: 2017-09-21 | Discharge: 2017-09-21 | Disposition: A | Discharge status: Home / Self Care | Payer: BLUE CROSS/BLUE SHIELD | Source: Ambulatory Visit | Attending: Nurse Practitioner | Admitting: Nurse Practitioner

## 2017-09-21 ENCOUNTER — Ambulatory Visit
Admission: RE | Admit: 2017-09-21 | Discharge: 2017-09-21 | Disposition: A | Discharge status: Home / Self Care | Payer: BLUE CROSS/BLUE SHIELD | Source: Ambulatory Visit | Attending: Internal Medicine | Admitting: Internal Medicine

## 2017-09-21 DIAGNOSIS — N611 Abscess of the breast and nipple: Secondary | ICD-10-CM | POA: Diagnosis not present

## 2017-09-21 DIAGNOSIS — K219 Gastro-esophageal reflux disease without esophagitis: Secondary | ICD-10-CM | POA: Diagnosis not present

## 2017-09-21 DIAGNOSIS — G4730 Sleep apnea, unspecified: Secondary | ICD-10-CM | POA: Diagnosis not present

## 2017-09-21 DIAGNOSIS — N6001 Solitary cyst of right breast: Secondary | ICD-10-CM | POA: Diagnosis not present

## 2017-09-21 DIAGNOSIS — Z79899 Other long term (current) drug therapy: Secondary | ICD-10-CM | POA: Diagnosis not present

## 2017-09-21 DIAGNOSIS — N6002 Solitary cyst of left breast: Secondary | ICD-10-CM | POA: Diagnosis not present

## 2017-09-21 DIAGNOSIS — F17200 Nicotine dependence, unspecified, uncomplicated: Secondary | ICD-10-CM | POA: Diagnosis not present

## 2017-09-21 DIAGNOSIS — N6489 Other specified disorders of breast: Secondary | ICD-10-CM | POA: Diagnosis not present

## 2017-09-21 DIAGNOSIS — I10 Essential (primary) hypertension: Secondary | ICD-10-CM | POA: Diagnosis not present

## 2017-09-22 ENCOUNTER — Ambulatory Visit (INDEPENDENT_AMBULATORY_CARE_PROVIDER_SITE_OTHER): Payer: BLUE CROSS/BLUE SHIELD | Admitting: Internal Medicine

## 2017-09-22 ENCOUNTER — Encounter: Payer: Self-pay | Admitting: Internal Medicine

## 2017-09-22 VITALS — BP 128/82 | HR 99 | Temp 98.2°F | Resp 16 | Ht 68.0 in | Wt 298.0 lb

## 2017-09-22 DIAGNOSIS — K219 Gastro-esophageal reflux disease without esophagitis: Secondary | ICD-10-CM

## 2017-09-22 DIAGNOSIS — I10 Essential (primary) hypertension: Secondary | ICD-10-CM

## 2017-09-22 DIAGNOSIS — Z0000 Encounter for general adult medical examination without abnormal findings: Secondary | ICD-10-CM

## 2017-09-22 DIAGNOSIS — R739 Hyperglycemia, unspecified: Secondary | ICD-10-CM | POA: Diagnosis not present

## 2017-09-22 DIAGNOSIS — E876 Hypokalemia: Secondary | ICD-10-CM | POA: Diagnosis not present

## 2017-09-22 NOTE — Progress Notes (Signed)
Subjective:    [name], female    DOB: 11/25/1971, 46 y.o.   MRN: [id number]  HPI She is here for a physical exam.   She had b/l breast abscess aspiration yesterday.  She will see surgery in a few days to consider surgical removal of the abscess since one is recurring.     Medications and allergies reviewed with patient and updated if appropriate.  Patient Active Problem List   Diagnosis Date Noted  . Hypokalemia 09/03/2017  . Acute prerenal failure ([place]) 09/03/2017  . Redness of eye, left 08/31/2017  . OSA (obstructive sleep apnea) 12/10/2016  . GERD (gastroesophageal reflux disease) 12/04/2016  . Hypersomnia 10/29/2016  . Back pain 08/21/2016  . Vomiting 08/21/2016  . Chronic pain of multiple joints 08/13/2016  . MGUS (monoclonal gammopathy of unknown significance) 07/02/2016  . Vitamin D deficiency 07/02/2016  . Diarrhea 07/02/2016  . Joint pain 04/03/2016  . HTN (hypertension), benign 11/11/2015  . Morbid obesity ([place]) 11/11/2015  . Adrenal mass ([place]) 08/30/2015  . Ovarian cyst 08/30/2015    Current Outpatient Prescriptions on File Prior to Visit  Medication Sig Dispense Refill  . amLODipine (NORVASC) 10 MG tablet Take 1 tablet (10 mg total) by mouth daily. 90 tablet 3  . DULoxetine (CYMBALTA) 30 MG capsule     . ergocalciferol (VITAMIN D2) 50000 units capsule Take 1 capsule (50,000 Units total) by mouth once a week. 12 capsule 0  . hyoscyamine (LEVSIN, ANASPAZ) 0.125 MG tablet Take 1 tablet (0.125 mg total) by mouth every 4 (four) hours as needed for cramping. 90 tablet 5  . Insulin Pen Needle (B-D ULTRAFINE III SHORT PEN) 31G X 8 MM MISC Use as directed daily with saxenda 30 each 5  . Liraglutide -Weight Management (SAXENDA) 18 MG/3ML SOPN Inject 3 mg into the skin daily. 3 pen 5  . lisinopril (PRINIVIL,ZESTRIL) 40 MG tablet Take 1 tablet (40 mg total) by mouth daily. 90 tablet 3  . meloxicam (MOBIC) 7.5 MG tablet     . ranitidine (ZANTAC) 150 MG  tablet     . tizanidine (ZANAFLEX) 6 MG capsule Take 1 capsule (6 mg total) by mouth 3 (three) times daily as needed for muscle spasms. 90 capsule 5  . traMADol (ULTRAM) 50 MG tablet     . [DISCONTINUED] losartan-hydrochlorothiazide (HYZAAR) 50-12.5 MG per tablet Take 1 tablet by mouth daily. 30 tablet 5   No current facility-administered medications on file prior to visit.     Past Medical History:  Diagnosis Date  . Arthritis   . Gallstones   . Hypertension   . Obesity   . Sleep apnea     Past Surgical History:  Procedure Laterality Date  . CHOLECYSTECTOMY N/A 12/21/2015   Procedure: LAPAROSCOPIC CHOLECYSTECTOMY;  Surgeon: Georganna Skeans, MD;  Location: [facility];  Service: General;  Laterality: N/A;  . UMBILICAL HERNIA REPAIR N/A 12/27/2015   Procedure: INCARCERATED UMBILICAL HERNIA REPAIR;  Surgeon: Coralie Keens, MD;  Location: [facility];  Service: General;  Laterality: N/A;    Social History   Social History  . Marital status: Married    Spouse name: N/A  . Number of children: 2  . Years of education: N/A   Occupational History  .      unemploed   Social History Main Topics  . Smoking status: Current Every Day Smoker    Packs/day: 0.50    Years: 24.00    Types: Cigarettes  . Smokeless tobacco: Never  Used  . Alcohol use 1.2 oz/week    2 Shots of liquor per week     Comment: per week  . Drug use: Yes    Types: Marijuana  . Sexual activity: Not on file   Other Topics Concern  . Not on file   Social History Narrative   Lives with husband, Berneta Sages.    Family History  Problem Relation Age of Onset  . Arthritis Mother   . Breast cancer Paternal Aunt   . Adrenal disorder Neg Hx   . Colon cancer Neg Hx   . Esophageal cancer Neg Hx   . Pancreatic cancer Neg Hx   . Stomach cancer Neg Hx   . Liver disease Neg Hx     Review of Systems  Constitutional: Negative for chills and fever.  Eyes: Negative for visual disturbance.  Respiratory: Negative for cough,  shortness of breath and wheezing.   Cardiovascular: Negative for chest pain, palpitations and leg swelling.  Gastrointestinal: Positive for nausea (from medication - not daily - saxenda). Negative for abdominal pain, blood in stool, constipation and diarrhea.  Genitourinary: Negative for dysuria and hematuria.  Musculoskeletal: Positive for arthralgias (knee) and back pain.  Skin: Negative for color change and rash.  Neurological: Negative for light-headedness and headaches.  Psychiatric/Behavioral: Negative for dysphoric mood. The patient is not nervous/anxious.        Objective:   Vitals:   09/22/17 0928  BP: 128/82  Pulse: 99  Resp: 16  Temp: 98.2 F (36.8 C)  SpO2: 98%   Wt Readings from Last 3 Encounters:  09/22/17 298 lb (135.2 kg)  09/02/17 296 lb 11.2 oz (134.6 kg)  08/31/17 299 lb (135.6 kg)   Body mass index is 45.31 kg/m.   Physical Exam    Constitutional: She appears well-developed and well-nourished. No distress.  HENT:  Head: Normocephalic and atraumatic.  Right Ear: External ear normal. Normal ear canal and TM Left Ear: External ear normal.  Normal ear canal and TM Mouth/Throat: Oropharynx is clear and moist.  Eyes: Conjunctivae and EOM are normal.  Neck: Neck supple. No tracheal deviation present. No thyromegaly present.  No carotid bruit  Cardiovascular: Normal rate, regular rhythm and normal heart sounds.   No murmur heard.  No edema. Pulmonary/Chest: Effort normal and breath sounds normal. No respiratory distress. She has no wheezes. She has no rales.  Breast: deferred to Gyn/ surgery Abdominal: Soft. She exhibits no distension. There is no tenderness.  Lymphadenopathy: She has no cervical adenopathy.  Skin: Skin is warm and dry. She is not diaphoretic.  Psychiatric: She has a normal mood and affect. Her behavior is normal.       Assessment & Plan:    Physical exam: Screening blood work  ordered Immunizations   Up to date  Mammogram  Up  to date - just had drainage of abscess via [place] [facility]  Not up to date - will schedule Eye exams   Up to date  EKG   Last done 12/2015 Exercise not regularly - was swimming over the summer, plans on joining a gym to swim due to joint pain Weight  - working on weight loss - taking saxenda and tolerating it well.   Skin no concerns Substance abuse    none  See Problem List for Assessment and Plan of chronic medical problems.  FU in 6 months

## 2017-09-22 NOTE — Assessment & Plan Note (Signed)
BP well controlled Current regimen effective and well tolerated Continue current medications at current doses cmp  

## 2017-09-22 NOTE — Assessment & Plan Note (Addendum)
hctz discontinued - last K was still slightly low - will recheck today

## 2017-09-22 NOTE — Assessment & Plan Note (Signed)
On saxenda  not currently exercising and weight loss has stopped - stressed regular exercise, decreased portions and healthy diet Needs to lose weight to have knee replaced - needs BMI of 40 Continue saxenda

## 2017-09-22 NOTE — Assessment & Plan Note (Signed)
Takes medication as needed Continue prn

## 2017-09-22 NOTE — Patient Instructions (Addendum)
Alexandra Powers  [place]  [place], [place] [address]  Main: [phone]    Test(s) ordered today. Your results will be released to [place] (or called to you) after review, usually within 72hours after test completion. If any changes need to be made, you will be notified at that same time.  All other Health Maintenance issues reviewed.   All recommended immunizations and age-appropriate screenings are up-to-date or discussed.  No immunizations administered today.   Medications reviewed and updated.  No changes recommended at this time.    Please followup in 6 months   Health Maintenance, Female Adopting a healthy lifestyle and getting preventive care can go a long way to promote health and wellness. Talk with your health care provider about what schedule of regular examinations is right for you. This is a good chance for you to check in with your provider about disease prevention and staying healthy. In between checkups, there are plenty of things you can do on your own. Experts have done a lot of research about which lifestyle changes and preventive measures are most likely to keep you healthy. Ask your health care provider for more information. Weight and diet Eat a healthy diet  Be sure to include plenty of vegetables, fruits, low-fat dairy products, and lean protein.  Do not eat a lot of foods high in solid fats, added sugars, or salt.  Get regular exercise. This is one of the most important things you can do for your health. ? Most adults should exercise for at least 150 minutes each week. The exercise should increase your heart rate and make you sweat (moderate-intensity exercise). ? Most adults should also do strengthening exercises at least twice a week. This is in addition to the moderate-intensity exercise.  Maintain a healthy weight  Body mass index (BMI) is a measurement that can be used to identify possible weight problems. It  estimates body fat based on height and weight. Your health care provider can help determine your BMI and help you achieve or maintain a healthy weight.  For females 20 years of age and older: ? A BMI below 18.5 is considered underweight. ? A BMI of 18.5 to 24.9 is normal. ? A BMI of 25 to 29.9 is considered overweight. ? A BMI of 30 and above is considered obese.  Watch levels of cholesterol and blood lipids  You should start having your blood tested for lipids and cholesterol at 47 years of age, then have this test every 5 years.  You may need to have your cholesterol levels checked more often if: ? Your lipid or cholesterol levels are high. ? You are older than 47 years of age. ? You are at high risk for heart disease.  Cancer screening Lung Cancer  Lung cancer screening is recommended for adults 29-62 years old who are at high risk for lung cancer because of a history of smoking.  A yearly low-dose CT scan of the lungs is recommended for people who: ? Currently smoke. ? Have quit within the past 15 years. ? Have at least a 30-pack-year history of smoking. A pack year is smoking an average of one pack of cigarettes a day for 1 year.  Yearly screening should continue until it has been 15 years since you quit.  Yearly screening should stop if you develop a health problem that would prevent you from having lung cancer treatment.  Breast Cancer  Practice breast self-awareness. This means understanding how  your breasts normally appear and feel.  It also means doing regular breast self-exams. Let your health care provider know about any changes, no matter how small.  If you are in your 20s or 30s, you should have a clinical breast exam (CBE) by a health care provider every 1-3 years as part of a regular health exam.  If you are 53 or older, have a CBE every year. Also consider having a breast X-ray (mammogram) every year.  If you have a family history of breast cancer, talk to  your health care provider about genetic screening.  If you are at high risk for breast cancer, talk to your health care provider about having an MRI and a mammogram every year.  Breast cancer gene (BRCA) assessment is recommended for women who have family members with BRCA-related cancers. BRCA-related cancers include: ? Breast. ? Ovarian. ? Tubal. ? Peritoneal cancers.  Results of the assessment will determine the need for genetic counseling and BRCA1 and BRCA2 testing.  Cervical Cancer Your health care provider may recommend that you be screened regularly for cancer of the pelvic organs (ovaries, uterus, and vagina). This screening involves a pelvic examination, including checking for microscopic changes to the surface of your cervix (Pap test). You may be encouraged to have this screening done every 3 years, beginning at age 3.  For women ages 8-65, health care providers may recommend pelvic exams and Pap testing every 3 years, or they may recommend the Pap and pelvic exam, combined with testing for human papilloma virus (HPV), every 5 years. Some types of HPV increase your risk of cervical cancer. Testing for HPV may also be done on women of any age with unclear Pap test results.  Other health care providers may not recommend any screening for nonpregnant women who are considered low risk for pelvic cancer and who do not have symptoms. Ask your health care provider if a screening pelvic exam is right for you.  If you have had past treatment for cervical cancer or a condition that could lead to cancer, you need Pap tests and screening for cancer for at least 20 years after your treatment. If Pap tests have been discontinued, your risk factors (such as having a new sexual partner) need to be reassessed to determine if screening should resume. Some women have medical problems that increase the chance of getting cervical cancer. In these cases, your health care provider may recommend more  frequent screening and Pap tests.  Colorectal Cancer  This type of cancer can be detected and often prevented.  Routine colorectal cancer screening usually begins at 48 years of age and continues through 47 years of age.  Your health care provider may recommend screening at an earlier age if you have risk factors for colon cancer.  Your health care provider may also recommend using home test kits to check for hidden blood in the stool.  A small camera at the end of a tube can be used to examine your colon directly (sigmoidoscopy or colonoscopy). This is done to check for the earliest forms of colorectal cancer.  Routine screening usually begins at age 43.  Direct examination of the colon should be repeated every 5-10 years through 47 years of age. However, you may need to be screened more often if early forms of precancerous polyps or small growths are found.  Skin Cancer  Check your skin from head to toe regularly.  Tell your health care provider about any new moles or changes  in moles, especially if there is a change in a mole's shape or color.  Also tell your health care provider if you have a mole that is larger than the size of a pencil eraser.  Always use sunscreen. Apply sunscreen liberally and repeatedly throughout the day.  Protect yourself by wearing long sleeves, pants, a wide-brimmed hat, and sunglasses whenever you are outside.  Heart disease, diabetes, and high blood pressure  High blood pressure causes heart disease and increases the risk of stroke. High blood pressure is more likely to develop in: ? People who have blood pressure in the high end of the normal range (130-139/85-89 mm Hg). ? People who are overweight or obese. ? People who are African American.  If you are 40-26 years of age, have your blood pressure checked every 3-5 years. If you are 65 years of age or older, have your blood pressure checked every year. You should have your blood pressure measured  twice-once when you are at a hospital or clinic, and once when you are not at a hospital or clinic. Record the average of the two measurements. To check your blood pressure when you are not at a hospital or clinic, you can use: ? An automated blood pressure machine at a pharmacy. ? A home blood pressure monitor.  If you are between 37 years and 87 years old, ask your health care provider if you should take aspirin to prevent strokes.  Have regular diabetes screenings. This involves taking a blood sample to check your fasting blood sugar level. ? If you are at a normal weight and have a low risk for diabetes, have this test once every three years after 47 years of age. ? If you are overweight and have a high risk for diabetes, consider being tested at a younger age or more often. Preventing infection Hepatitis B  If you have a higher risk for hepatitis B, you should be screened for this virus. You are considered at high risk for hepatitis B if: ? You were born in a country where hepatitis B is common. Ask your health care provider which countries are considered high risk. ? Your parents were born in a high-risk country, and you have not been immunized against hepatitis B (hepatitis B vaccine). ? You have HIV or AIDS. ? You use needles to inject street drugs. ? You live with someone who has hepatitis B. ? You have had sex with someone who has hepatitis B. ? You get hemodialysis treatment. ? You take certain medicines for conditions, including cancer, organ transplantation, and autoimmune conditions.  Hepatitis C  Blood testing is recommended for: ? Everyone born from [birth year] through 1966. ? Anyone with known risk factors for hepatitis C.  Sexually transmitted infections (STIs)  You should be screened for sexually transmitted infections (STIs) including gonorrhea and chlamydia if: ? You are sexually active and are younger than 47 years of age. ? You are older than 47 years of age and your  health care provider tells you that you are at risk for this type of infection. ? Your sexual activity has changed since you were last screened and you are at an increased risk for chlamydia or gonorrhea. Ask your health care provider if you are at risk.  If you do not have HIV, but are at risk, it may be recommended that you take a prescription medicine daily to prevent HIV infection. This is called pre-exposure prophylaxis (PrEP). You are considered at risk if: ? You  are sexually active and do not regularly use condoms or know the HIV status of your partner(s). ? You take drugs by injection. ? You are sexually active with a partner who has HIV.  Talk with your health care provider about whether you are at high risk of being infected with HIV. If you choose to begin PrEP, you should first be tested for HIV. You should then be tested every 3 months for as long as you are taking PrEP. Pregnancy  If you are premenopausal and you may become pregnant, ask your health care provider about preconception counseling.  If you may become pregnant, take 400 to 800 micrograms (mcg) of folic acid every day.  If you want to prevent pregnancy, talk to your health care provider about birth control (contraception). Osteoporosis and menopause  Osteoporosis is a disease in which the bones lose minerals and strength with aging. This can result in serious bone fractures. Your risk for osteoporosis can be identified using a bone density scan.  If you are 51 years of age or older, or if you are at risk for osteoporosis and fractures, ask your health care provider if you should be screened.  Ask your health care provider whether you should take a calcium or vitamin D supplement to lower your risk for osteoporosis.  Menopause may have certain physical symptoms and risks.  Hormone replacement therapy may reduce some of these symptoms and risks. Talk to your health care provider about whether hormone replacement  therapy is right for you. Follow these instructions at home:  Schedule regular health, dental, and eye exams.  Stay current with your immunizations.  Do not use any tobacco products including cigarettes, chewing tobacco, or electronic cigarettes.  If you are pregnant, do not drink alcohol.  If you are breastfeeding, limit how much and how often you drink alcohol.  Limit alcohol intake to no more than 1 drink per day for nonpregnant women. One drink equals 12 ounces of beer, 5 ounces of wine, or 1 ounces of hard liquor.  Do not use street drugs.  Do not share needles.  Ask your health care provider for help if you need support or information about quitting drugs.  Tell your health care provider if you often feel depressed.  Tell your health care provider if you have ever been abused or do not feel safe at home. This information is not intended to replace advice given to you by your health care provider. Make sure you discuss any questions you have with your health care provider. Document Released: 06/01/2011 Document Revised: 04/23/2016 Document Reviewed: 08/20/2015 Elsevier Interactive Patient Education  [employer].

## 2017-09-23 ENCOUNTER — Other Ambulatory Visit (INDEPENDENT_AMBULATORY_CARE_PROVIDER_SITE_OTHER): Payer: BLUE CROSS/BLUE SHIELD

## 2017-09-23 DIAGNOSIS — Z0000 Encounter for general adult medical examination without abnormal findings: Secondary | ICD-10-CM | POA: Diagnosis not present

## 2017-09-23 DIAGNOSIS — R739 Hyperglycemia, unspecified: Secondary | ICD-10-CM | POA: Diagnosis not present

## 2017-09-23 DIAGNOSIS — I10 Essential (primary) hypertension: Secondary | ICD-10-CM | POA: Diagnosis not present

## 2017-09-23 LAB — LIPID PANEL
CHOL/HDL RATIO: 4
Cholesterol: 179 mg/dL (ref 0–200)
HDL: 45.4 mg/dL (ref >39.00)
LDL CALC: 120 mg/dL — AB (ref 0–99)
NONHDL: 134.01
TRIGLYCERIDES: 70 mg/dL (ref 0.0–149.0)
VLDL: 14 mg/dL (ref 0.0–40.0)

## 2017-09-23 LAB — CBC WITH DIFFERENTIAL/PLATELET
BASOS ABS: 0.1 10*3/uL (ref 0.0–0.1)
Basophils Relative: 0.5 % (ref 0.0–3.0)
EOS PCT: 1.6 % (ref 0.0–5.0)
Eosinophils Absolute: 0.2 10*3/uL (ref 0.0–0.7)
HCT: 35.7 % — ABNORMAL LOW (ref 36.0–46.0)
Hemoglobin: 11.7 g/dL — ABNORMAL LOW (ref 12.0–15.0)
LYMPHS ABS: 3.3 10*3/uL (ref 0.7–4.0)
Lymphocytes Relative: 30.4 % (ref 12.0–46.0)
MCHC: 32.9 g/dL (ref 30.0–36.0)
MCV: 98.5 fl (ref 78.0–100.0)
MONO ABS: 0.8 10*3/uL (ref 0.1–1.0)
Monocytes Relative: 6.9 % (ref 3.0–12.0)
NEUTROS ABS: 6.7 10*3/uL (ref 1.4–7.7)
Neutrophils Relative %: 60.6 % (ref 43.0–77.0)
PLATELETS: 386 10*3/uL (ref 150.0–400.0)
RBC: 3.62 Mil/uL — AB (ref 3.87–5.11)
RDW: 15.1 % (ref 11.5–15.5)
WBC: 11 10*3/uL — ABNORMAL HIGH (ref 4.0–10.5)

## 2017-09-23 LAB — COMPREHENSIVE METABOLIC PANEL
ALK PHOS: 57 U/L (ref 39–117)
ALT: 5 U/L (ref 0–35)
AST: 10 U/L (ref 0–37)
Albumin: 3.5 g/dL (ref 3.5–5.2)
BUN: 11 mg/dL (ref 6–23)
CHLORIDE: 101 meq/L (ref 96–112)
CO2: 29 meq/L (ref 19–32)
Calcium: 9.3 mg/dL (ref 8.4–10.5)
Creatinine, Ser: 1.22 mg/dL — ABNORMAL HIGH (ref 0.40–1.20)
GFR: 60.68 mL/min (ref >60.00)
GLUCOSE: 81 mg/dL (ref 70–99)
POTASSIUM: 3.1 meq/L — AB (ref 3.5–5.1)
SODIUM: 138 meq/L (ref 135–145)
TOTAL PROTEIN: 7.6 g/dL (ref 6.0–8.3)
Total Bilirubin: 0.4 mg/dL (ref 0.2–1.2)

## 2017-09-23 LAB — HEMOGLOBIN A1C: Hgb A1c MFr Bld: 5.5 % (ref 4.6–6.5)

## 2017-09-26 ENCOUNTER — Other Ambulatory Visit: Payer: Self-pay | Admitting: Internal Medicine

## 2017-09-26 ENCOUNTER — Encounter: Payer: Self-pay | Admitting: Internal Medicine

## 2017-09-26 DIAGNOSIS — E876 Hypokalemia: Secondary | ICD-10-CM

## 2017-09-26 LAB — AEROBIC/ANAEROBIC CULTURE W GRAM STAIN (SURGICAL/DEEP WOUND): Special Requests: NORMAL

## 2017-09-26 LAB — AEROBIC/ANAEROBIC CULTURE (SURGICAL/DEEP WOUND)

## 2017-09-26 MED ORDER — POTASSIUM CHLORIDE CRYS ER 20 MEQ PO TBCR: 20.0000 meq | EXTENDED_RELEASE_TABLET | Freq: Every day | ORAL | 3 refills | Status: DC

## 2017-09-29 LAB — AEROBIC/ANAEROBIC CULTURE (SURGICAL/DEEP WOUND)

## 2017-09-29 LAB — AEROBIC/ANAEROBIC CULTURE W GRAM STAIN (SURGICAL/DEEP WOUND): Special Requests: NORMAL

## 2017-09-30 ENCOUNTER — Other Ambulatory Visit (INDEPENDENT_AMBULATORY_CARE_PROVIDER_SITE_OTHER): Payer: BLUE CROSS/BLUE SHIELD

## 2017-09-30 DIAGNOSIS — E876 Hypokalemia: Secondary | ICD-10-CM | POA: Diagnosis not present

## 2017-09-30 LAB — BASIC METABOLIC PANEL
BUN: 17 mg/dL (ref 6–23)
CALCIUM: 9.3 mg/dL (ref 8.4–10.5)
CO2: 32 meq/L (ref 19–32)
Chloride: 102 mEq/L (ref 96–112)
Creatinine, Ser: 1.28 mg/dL — ABNORMAL HIGH (ref 0.40–1.20)
GFR: 57.4 mL/min — ABNORMAL LOW (ref >60.00)
GLUCOSE: 96 mg/dL (ref 70–99)
Potassium: 3.2 mEq/L — ABNORMAL LOW (ref 3.5–5.1)
Sodium: 142 mEq/L (ref 135–145)

## 2017-10-01 ENCOUNTER — Encounter: Payer: Self-pay | Admitting: Internal Medicine

## 2017-10-01 DIAGNOSIS — E876 Hypokalemia: Secondary | ICD-10-CM

## 2017-10-04 ENCOUNTER — Ambulatory Visit: Payer: Self-pay | Admitting: Surgery

## 2017-10-04 ENCOUNTER — Other Ambulatory Visit: Payer: BLUE CROSS/BLUE SHIELD

## 2017-10-04 DIAGNOSIS — N6019 Diffuse cystic mastopathy of unspecified breast: Secondary | ICD-10-CM | POA: Diagnosis not present

## 2017-10-04 DIAGNOSIS — N611 Abscess of the breast and nipple: Secondary | ICD-10-CM | POA: Diagnosis not present

## 2017-10-04 NOTE — H&P (View-Only) (Signed)
on [facility] 10/04/2017 9:23 AM Location: [facility] Patient #: [id number] DOB: 1971/04/02 Married / Language: English / Race: Black or African American Female  History of Present Illness Marcello Moores A. Korie Streat MD; 10/04/2017 9:44 AM) Patient words: Patient sent at the request of Dr. Celso Amy for bilateral breast abscesses. She has a history of undergoing aspiration of the left breast abscess in September 2018. She's been on multiple trials of antibiotics. She has a second breast abscess on the right which is improved but not resolved on antibiotics. She underwent mammogram and ultrasound which showed a left breast abscess was aspirated. She's had some initial improvement but the symptoms are not totally resolved. Both areas are tender and located under both nipples. The one on the left o'clock on the right and left. Patient has no history of nipple drainage but does have history of pain and redness in both breasts consistent with mastitis as well. She's had marginal improvement on antibiotics but not complete resolution of all of her symptoms.  The patient is a 46 year old female.   Diagnostic Studies History (Tanisha A. Owens Shark, [place]; 10/04/2017 9:23 AM) Colonoscopy never Mammogram within last year 1-3 years ago Pap Smear 1-5 years ago  Allergies (Tanisha A. Owens Shark, [place]; 10/04/2017 9:25 AM) No Known Drug Allergies 12/05/2015 Allergies Reconciled  Medication History (Tanisha A. Owens Shark, [place]; 10/04/2017 9:26 AM) Lisinopril-Hydrochlorothiazide (20-25MG  Tablet, Oral daily) Active. AmLODIPine Besylate (10MG  Tablet, Oral daily) Active. TraMADol HCl (50MG  Tablet, Oral) Active. Klor-Con M20 [facility] Tablet ER, Oral) Active. Lisinopril (40MG  Tablet, Oral) Active. Zirgan (0.15% Gel, Ophthalmic) Active. Medications Reconciled  Social History (Tanisha A. Owens Shark, [place]; 10/04/2017 9:23 AM) Alcohol use Occasional alcohol use. Illicit drug use Uses daily, Uses weekly. No caffeine  use Tobacco use Current every day smoker.  Family History (Tanisha A. Owens Shark, [place]; 10/04/2017 9:23 AM) Arthritis Mother. Hypertension Father, Mother.  Pregnancy / Birth History (Tanisha A. Owens Shark, [place]; 10/04/2017 9:23 AM) Age at menarche 57 years. Gravida 2 Irregular periods Maternal age 3-20 Para 2  Other Problems (Tanisha A. Owens Shark, [place]; 10/04/2017 9:23 AM) Back Pain Cholelithiasis Gastroesophageal Reflux Disease High blood pressure     Review of Systems (Tanisha A. Brown RMA; 10/04/2017 9:24 AM) General Present- Weight Loss. Not Present- Appetite Loss, Chills, Fatigue, Fever, Night Sweats and Weight Gain. Skin Not Present- Change in Wart/Mole, Dryness, Hives, Jaundice, New Lesions, Non-Healing Wounds, Rash and Ulcer. HEENT Not Present- Earache, Hearing Loss, Hoarseness, Nose Bleed, Oral Ulcers, Ringing in the Ears, Seasonal Allergies, Sinus Pain, Sore Throat, Visual Disturbances, Wears glasses/contact lenses and Yellow Eyes. Respiratory Not Present- Bloody sputum, Chronic Cough, Difficulty Breathing, Snoring and Wheezing. Breast Present- Breast Mass, Breast Pain and Nipple Discharge. Not Present- Skin Changes. Cardiovascular Not Present- Chest Pain, Difficulty Breathing Lying Down, Leg Cramps, Palpitations, Rapid Heart Rate, Shortness of Breath and Swelling of Extremities. Gastrointestinal Present- Vomiting. Not Present- Abdominal Pain, Bloating, Bloody Stool, Change in Bowel Habits, Chronic diarrhea, Constipation, Difficulty Swallowing, Excessive gas, Gets full quickly at meals, Hemorrhoids, Indigestion, Nausea and Rectal Pain. Female Genitourinary Not Present- Frequency, Nocturia, Painful Urination, Pelvic Pain and Urgency. Musculoskeletal Present- Back Pain. Not Present- Joint Pain, Joint Stiffness, Muscle Pain, Muscle Weakness and Swelling of Extremities. Neurological Present- Fainting. Not Present- Decreased Memory, Headaches, Numbness, Seizures, Tingling, Tremor,  Trouble walking and Weakness. Psychiatric Not Present- Anxiety, Bipolar, Change in Sleep Pattern, Depression, Fearful and Frequent crying. Endocrine Not Present- Cold Intolerance, Excessive Hunger, Hair Changes, Heat Intolerance, Hot flashes and New Diabetes. Hematology Not Present- Blood  Thinners, Easy Bruising, Excessive bleeding, Gland problems, HIV and Persistent Infections.  Vitals (Tanisha A. Brown RMA; 10/04/2017 9:25 AM) 10/04/2017 9:24 AM Weight: 302.8 lb Height: 68in Body Surface Area: 2.44 m Body Mass Index: 46.04 kg/m  Temp.: 98.3F  Pulse: 100 (Regular)  BP: 128/88 (Sitting, Left Arm, Standard)      Physical Exam (Jakiya Bookbinder A. Isaish Alemu MD; 10/04/2017 9:46 AM)  General Mental Status-Alert. General Appearance-Consistent with stated age. Hydration-Well hydrated. Voice-Normal.  Head and Neck Head-normocephalic, atraumatic with no lesions or palpable masses. Trachea-midline. Thyroid Gland Characteristics - normal size and consistency.  Chest and Lung Exam Chest and lung exam reveals -quiet, even and easy respiratory effort with no use of accessory muscles and on auscultation, normal breath sounds, no adventitious sounds and normal vocal resonance. Inspection Chest Wall - Normal. Back - normal.  Breast Note: Mass left breast 3:00 is tender. No redness or drainage. Mass right breast 11:00 is mildly tender without redness. There is no nipple drainage. No other masses are noted. .   Cardiovascular Cardiovascular examination reveals -normal heart sounds, regular rate and rhythm with no murmurs and normal pedal pulses bilaterally.  Neurologic Neurologic evaluation reveals -alert and oriented x 3 with no impairment of recent or remote memory. Mental Status-Normal.  Musculoskeletal Normal Exam - Left-Upper Extremity Strength Normal and Lower Extremity Strength Normal. Normal Exam - Right-Upper Extremity Strength Normal and Lower  Extremity Strength Normal.  Lymphatic Head & Neck  General Head & Neck Lymphatics: Bilateral - Description - Normal. Axillary  General Axillary Region: Bilateral - Description - Normal. Tenderness - Non Tender.    Assessment & Plan (Joniel Graumann A. Amiayah Giebel MD; 10/04/2017 9:47 AM)  BREAST ABSCESS (N61.1) Impression: Risk of lumpectomy include bleeding, infection, seroma, more surgery, use of seed/wire, wound care, cosmetic deformity and the need for other treatments, death , blood clots, death. Pt agrees to proceed. Patient is failed nonoperative management. She did aspirations and antibiotics and her symptoms have not improved. Recommend bilateral central duct excision to excise these areas for complete resolution. Other alternatives include aspiration antibiotics but she is retried this and her symptoms have not resolved.  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 The anatomy and the physiology was discussed. The pathophysiology and natural history of the disease was discussed. Risk of bleeding infection numbness of nipple , loss of sensation, cosmetic deformity and the need for other surgery. Options were discussed and recommendations were made. Technique, risks, benefits, & alternatives were discussed. Risks such as stroke, heart attack, bleeding, indection, death, and other risks discussed. Questions answered. The patient agrees to proceed.

## 2017-10-04 NOTE — H&P (Signed)
on [facility] 10/04/2017 9:23 AM Location: [facility] Patient #: [id number] DOB: 05-May-1971 Married / Language: English / Race: Black or African American Female  History of Present Illness Alexandra Powers A. Alexandra Strader MD; 10/04/2017 9:44 AM) Patient words: Patient sent at the request of Dr. Celso Amy for bilateral breast abscesses. She has a history of undergoing aspiration of the left breast abscess in September 2018. She's been on multiple trials of antibiotics. She has a second breast abscess on the right which is improved but not resolved on antibiotics. She underwent mammogram and ultrasound which showed a left breast abscess was aspirated. She's had some initial improvement but the symptoms are not totally resolved. Both areas are tender and located under both nipples. The one on the left o'clock on the right and left. Patient has no history of nipple drainage but does have history of pain and redness in both breasts consistent with mastitis as well. She's had marginal improvement on antibiotics but not complete resolution of all of her symptoms.  The patient is a 46 year old female.   Diagnostic Studies History (Alexandra Powers, [place]; 10/04/2017 9:23 AM) Colonoscopy never Mammogram within last year 1-3 years ago Pap Smear 1-5 years ago  Allergies (Alexandra Powers, [place]; 10/04/2017 9:25 AM) No Known Drug Allergies 12/05/2015 Allergies Reconciled  Medication History (Alexandra Powers, [place]; 10/04/2017 9:26 AM) Lisinopril-Hydrochlorothiazide (20-25MG  Tablet, Oral daily) Active. AmLODIPine Besylate (10MG  Tablet, Oral daily) Active. TraMADol HCl (50MG  Tablet, Oral) Active. Klor-Con M20 [facility] Tablet ER, Oral) Active. Lisinopril (40MG  Tablet, Oral) Active. Zirgan (0.15% Gel, Ophthalmic) Active. Medications Reconciled  Social History (Alexandra Powers, [place]; 10/04/2017 9:23 AM) Alcohol use Occasional alcohol use. Illicit drug use Uses daily, Uses weekly. No caffeine  use Tobacco use Current every day smoker.  Family History (Alexandra Powers, [place]; 10/04/2017 9:23 AM) Arthritis Mother. Hypertension Father, Mother.  Pregnancy / Birth History (Alexandra Powers, [place]; 10/04/2017 9:23 AM) Age at menarche 52 years. Gravida 2 Irregular periods Maternal age 31-20 Para 2  Other Problems (Alexandra Powers, [place]; 10/04/2017 9:23 AM) Back Pain Cholelithiasis Gastroesophageal Reflux Disease High blood pressure     Review of Systems (Alexandra Powers RMA; 10/04/2017 9:24 AM) General Present- Weight Loss. Not Present- Appetite Loss, Chills, Fatigue, Fever, Night Sweats and Weight Gain. Skin Not Present- Change in Wart/Mole, Dryness, Hives, Jaundice, New Lesions, Non-Healing Wounds, Rash and Ulcer. HEENT Not Present- Earache, Hearing Loss, Hoarseness, Nose Bleed, Oral Ulcers, Ringing in the Ears, Seasonal Allergies, Sinus Pain, Sore Throat, Visual Disturbances, Wears glasses/contact lenses and Yellow Eyes. Respiratory Not Present- Bloody sputum, Chronic Cough, Difficulty Breathing, Snoring and Wheezing. Breast Present- Breast Mass, Breast Pain and Nipple Discharge. Not Present- Skin Changes. Cardiovascular Not Present- Chest Pain, Difficulty Breathing Lying Down, Leg Cramps, Palpitations, Rapid Heart Rate, Shortness of Breath and Swelling of Extremities. Gastrointestinal Present- Vomiting. Not Present- Abdominal Pain, Bloating, Bloody Stool, Change in Bowel Habits, Chronic diarrhea, Constipation, Difficulty Swallowing, Excessive gas, Gets full quickly at meals, Hemorrhoids, Indigestion, Nausea and Rectal Pain. Female Genitourinary Not Present- Frequency, Nocturia, Painful Urination, Pelvic Pain and Urgency. Musculoskeletal Present- Back Pain. Not Present- Joint Pain, Joint Stiffness, Muscle Pain, Muscle Weakness and Swelling of Extremities. Neurological Present- Fainting. Not Present- Decreased Memory, Headaches, Numbness, Seizures, Tingling, Tremor,  Trouble walking and Weakness. Psychiatric Not Present- Anxiety, Bipolar, Change in Sleep Pattern, Depression, Fearful and Frequent crying. Endocrine Not Present- Cold Intolerance, Excessive Hunger, Hair Changes, Heat Intolerance, Hot flashes and New Diabetes. Hematology Not Present- Blood  Thinners, Easy Bruising, Excessive bleeding, Gland problems, HIV and Persistent Infections.  Vitals (Alexandra Powers RMA; 10/04/2017 9:25 AM) 10/04/2017 9:24 AM Weight: 302.8 lb Height: 68in Body Surface Area: 2.44 m Body Mass Index: 46.04 kg/m  Temp.: 98.68F  Pulse: 100 (Regular)  BP: 128/88 (Sitting, Left Arm, Standard)      Physical Exam (Tayte Mcwherter A. Hollan Philipp MD; 10/04/2017 9:46 AM)  General Mental Status-Alert. General Appearance-Consistent with stated age. Hydration-Well hydrated. Voice-Normal.  Head and Neck Head-normocephalic, atraumatic with no lesions or palpable masses. Trachea-midline. Thyroid Gland Characteristics - normal size and consistency.  Chest and Lung Exam Chest and lung exam reveals -quiet, even and easy respiratory effort with no use of accessory muscles and on auscultation, normal breath sounds, no adventitious sounds and normal vocal resonance. Inspection Chest Wall - Normal. Back - normal.  Breast Note: Mass left breast 3:00 is tender. No redness or drainage. Mass right breast 11:00 is mildly tender without redness. There is no nipple drainage. No other masses are noted. .   Cardiovascular Cardiovascular examination reveals -normal heart sounds, regular rate and rhythm with no murmurs and normal pedal pulses bilaterally.  Neurologic Neurologic evaluation reveals -alert and oriented x 3 with no impairment of recent or remote memory. Mental Status-Normal.  Musculoskeletal Normal Exam - Left-Upper Extremity Strength Normal and Lower Extremity Strength Normal. Normal Exam - Right-Upper Extremity Strength Normal and Lower  Extremity Strength Normal.  Lymphatic Head & Neck  General Head & Neck Lymphatics: Bilateral - Description - Normal. Axillary  General Axillary Region: Bilateral - Description - Normal. Tenderness - Non Tender.    Assessment & Plan (Jhony Antrim A. Chantavia Bazzle MD; 10/04/2017 9:47 AM)  BREAST ABSCESS (N61.1) Impression: Risk of lumpectomy include bleeding, infection, seroma, more surgery, use of seed/wire, wound care, cosmetic deformity and the need for other treatments, death , blood clots, death. Pt agrees to proceed. Patient is failed nonoperative management. She did aspirations and antibiotics and her symptoms have not improved. Recommend bilateral central duct excision to excise these areas for complete resolution. Other alternatives include aspiration antibiotics but she is retried this and her symptoms have not resolved.  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 The anatomy and the physiology was discussed. The pathophysiology and natural history of the disease was discussed. Risk of bleeding infection numbness of nipple , loss of sensation, cosmetic deformity and the need for other surgery. Options were discussed and recommendations were made. Technique, risks, benefits, & alternatives were discussed. Risks such as stroke, heart attack, bleeding, indection, death, and other risks discussed. Questions answered. The patient agrees to proceed.

## 2017-10-05 DIAGNOSIS — M533 Sacrococcygeal disorders, not elsewhere classified: Secondary | ICD-10-CM | POA: Diagnosis not present

## 2017-10-05 DIAGNOSIS — M47817 Spondylosis without myelopathy or radiculopathy, lumbosacral region: Secondary | ICD-10-CM | POA: Diagnosis not present

## 2017-10-05 NOTE — Progress Notes (Signed)
Pt. ON LIRAGLUTIDE (SAXENDA) FOR WEIGH LOSS.

## 2017-10-05 NOTE — Pre-Procedure Instructions (Addendum)
Kameela Leipold  10/05/2017      [place] ([place] [place] [facility] [address] [place] ([place]) [place] [address] Phone: [phone] Fax: [phone]    Your procedure is scheduled on Tues. Nov. 13  Report to [facility] Admitting at 5:30 A.M.  Call this number if you have problems the morning of surgery:  [phone]   Remember:  Do not eat food or drink liquids after midnight on Mon. Nov. 10                       DRINK BOOST BREEZE AT 3:30 A.M.                   DO NOT TAKE LIRAGLUTIDE (SAXENDA) THE DAY OF SURGERY   Take these medicines the morning of surgery with A SIP OF WATER : amlodipine (norvasc), hyoscyamine (levsin,anaspaz), zantac(ranitidine), tizanidine(zanaflex)  7 days prior to surgery STOP taking any Aspirin (unless otherwise instructed by your surgeon), Aleve, Naproxen, Ibuprofen, Motrin, Advil, Goody's, BC's, all herbal medications, fish oil, and all vitamins   Do not wear jewelry, make-up or nail polish.  Do not wear lotions, powders, or perfumes, or deoderant.  Do not shave 48 hours prior to surgery.  Men may shave face and neck.  Do not bring valuables to the hospital.  [facility] is not responsible for any belongings or valuables.  Contacts, dentures or bridgework may not be worn into surgery.  Leave your suitcase in the car.  After surgery it may be brought to your room.  For patients admitted to the hospital, discharge time will be determined by your treatment team.  Patients discharged the day of surgery will not be allowed to drive home.    Special instructions:  [place]- Preparing For Surgery  Before surgery, you can play an important role. Because skin is not sterile, your skin needs to be as free of germs as possible. You can reduce the number of germs on your skin by washing with CHG (chlorahexidine gluconate) Soap before surgery.  CHG is an antiseptic cleaner which kills germs and bonds with the  skin to continue killing germs even after washing.  Please do not use if you have an allergy to CHG or antibacterial soaps. If your skin becomes reddened/irritated stop using the CHG.  Do not shave (including legs and underarms) for at least 48 hours prior to first CHG shower. It is OK to shave your face.  Please follow these instructions carefully.   1. Shower the NIGHT BEFORE SURGERY and the MORNING OF SURGERY with CHG.   2. If you chose to wash your hair, wash your hair first as usual with your normal shampoo.  3. After you shampoo, rinse your hair and body thoroughly to remove the shampoo.  4. Use CHG as you would any other liquid soap. You can apply CHG directly to the skin and wash gently with a scrungie or a clean washcloth.   5. Apply the CHG Soap to your body ONLY FROM THE NECK DOWN.  Do not use on open wounds or open sores. Avoid contact with your eyes, ears, mouth and genitals (private parts). Wash Face and genitals (private parts)  with your normal soap.  6. Wash thoroughly, paying special attention to the area where your surgery will be performed.  7. Thoroughly rinse your body with warm water from the neck down.  8. DO NOT shower/wash  with your normal soap after using and rinsing off the CHG Soap.  9. Pat yourself dry with a CLEAN TOWEL.  10. Wear CLEAN PAJAMAS to bed the night before surgery, wear comfortable clothes the morning of surgery  11. Place CLEAN SHEETS on your bed the night of your first shower and DO NOT SLEEP WITH PETS.    Day of Surgery: Do not apply any deodorants/lotions. Please wear clean clothes to the hospital/surgery center.      Please read over the following fact sheets that you were given. Coughing and Deep Breathing and Surgical Site Infection Prevention

## 2017-10-06 ENCOUNTER — Encounter (HOSPITAL_COMMUNITY): Payer: Self-pay

## 2017-10-06 ENCOUNTER — Encounter (HOSPITAL_COMMUNITY)
Admission: RE | Admit: 2017-10-06 | Discharge: 2017-10-06 | Disposition: A | Discharge status: Home / Self Care | Payer: BLUE CROSS/BLUE SHIELD | Source: Ambulatory Visit | Attending: Surgery | Admitting: Surgery

## 2017-10-06 ENCOUNTER — Other Ambulatory Visit: Payer: Self-pay

## 2017-10-06 DIAGNOSIS — I10 Essential (primary) hypertension: Secondary | ICD-10-CM | POA: Insufficient documentation

## 2017-10-06 DIAGNOSIS — N611 Abscess of the breast and nipple: Secondary | ICD-10-CM | POA: Diagnosis not present

## 2017-10-06 HISTORY — DX: Nausea with vomiting, unspecified: R11.2

## 2017-10-06 HISTORY — DX: Gastro-esophageal reflux disease without esophagitis: K21.9

## 2017-10-06 HISTORY — DX: Other specified postprocedural states: Z98.890

## 2017-10-06 LAB — CBC WITH DIFFERENTIAL/PLATELET
Basophils Absolute: 0 10*3/uL (ref 0.0–0.1)
Basophils Relative: 0 %
EOS ABS: 0.2 10*3/uL (ref 0.0–0.7)
EOS PCT: 2 %
HEMATOCRIT: 37.7 % (ref 36.0–46.0)
HEMOGLOBIN: 12.4 g/dL (ref 12.0–15.0)
LYMPHS PCT: 35 %
Lymphs Abs: 4.3 10*3/uL — ABNORMAL HIGH (ref 0.7–4.0)
MCH: 32 pg (ref 26.0–34.0)
MCHC: 32.9 g/dL (ref 30.0–36.0)
MCV: 97.2 fL (ref 78.0–100.0)
MONOS PCT: 8 %
Monocytes Absolute: 1 10*3/uL (ref 0.1–1.0)
NEUTROS ABS: 6.8 10*3/uL (ref 1.7–7.7)
NEUTROS PCT: 55 %
Platelets: 360 10*3/uL (ref 150–400)
RBC: 3.88 MIL/uL (ref 3.87–5.11)
RDW: 15.3 % (ref 11.5–15.5)
WBC: 12.3 10*3/uL — ABNORMAL HIGH (ref 4.0–10.5)

## 2017-10-06 LAB — COMPREHENSIVE METABOLIC PANEL
ALK PHOS: 55 U/L (ref 38–126)
ALT: 10 U/L — AB (ref 14–54)
AST: 18 U/L (ref 15–41)
Albumin: 3.3 g/dL — ABNORMAL LOW (ref 3.5–5.0)
Anion gap: 7 (ref 5–15)
BILIRUBIN TOTAL: 0.5 mg/dL (ref 0.3–1.2)
BUN: 12 mg/dL (ref 6–20)
CALCIUM: 9.4 mg/dL (ref 8.9–10.3)
CO2: 26 mmol/L (ref 22–32)
CREATININE: 1.35 mg/dL — AB (ref 0.44–1.00)
Chloride: 105 mmol/L (ref 101–111)
GFR calc non Af Amer: 46 mL/min — ABNORMAL LOW (ref >60)
GFR, EST AFRICAN AMERICAN: 53 mL/min — AB (ref >60)
Glucose, Bld: 97 mg/dL (ref 65–99)
Potassium: 3.6 mmol/L (ref 3.5–5.1)
SODIUM: 138 mmol/L (ref 135–145)
TOTAL PROTEIN: 7.7 g/dL (ref 6.5–8.1)

## 2017-10-06 LAB — HCG, SERUM, QUALITATIVE: Preg, Serum: NEGATIVE

## 2017-10-06 NOTE — Addendum Note (Signed)
Addended by: Binnie Rail on: 10/06/2017 12:54 PM   Modules accepted: Orders

## 2017-10-11 MED ORDER — DEXTROSE 5 % IV SOLN
3.0000 g | INTRAVENOUS | Status: AC
  Administered 2017-10-12: 3 g via INTRAVENOUS
  Filled 2017-10-11: qty 3

## 2017-10-12 ENCOUNTER — Ambulatory Visit (HOSPITAL_COMMUNITY): Payer: BLUE CROSS/BLUE SHIELD | Admitting: Certified Registered Nurse Anesthetist

## 2017-10-12 ENCOUNTER — Ambulatory Visit (HOSPITAL_COMMUNITY)
Admission: RE | Admit: 2017-10-12 | Discharge: 2017-10-12 | Disposition: A | Discharge status: Home / Self Care | Payer: BLUE CROSS/BLUE SHIELD | Source: Ambulatory Visit | Attending: Surgery | Admitting: Surgery

## 2017-10-12 ENCOUNTER — Encounter (HOSPITAL_COMMUNITY)
Admission: RE | Disposition: A | Discharge status: Home / Self Care | Payer: Self-pay | Source: Ambulatory Visit | Attending: Surgery

## 2017-10-12 ENCOUNTER — Encounter (HOSPITAL_COMMUNITY): Payer: Self-pay

## 2017-10-12 DIAGNOSIS — N6002 Solitary cyst of left breast: Secondary | ICD-10-CM | POA: Diagnosis not present

## 2017-10-12 DIAGNOSIS — G4730 Sleep apnea, unspecified: Secondary | ICD-10-CM | POA: Diagnosis not present

## 2017-10-12 DIAGNOSIS — F17200 Nicotine dependence, unspecified, uncomplicated: Secondary | ICD-10-CM | POA: Insufficient documentation

## 2017-10-12 DIAGNOSIS — N611 Abscess of the breast and nipple: Secondary | ICD-10-CM | POA: Diagnosis not present

## 2017-10-12 DIAGNOSIS — N6001 Solitary cyst of right breast: Secondary | ICD-10-CM | POA: Diagnosis not present

## 2017-10-12 DIAGNOSIS — N83209 Unspecified ovarian cyst, unspecified side: Secondary | ICD-10-CM | POA: Diagnosis not present

## 2017-10-12 DIAGNOSIS — K219 Gastro-esophageal reflux disease without esophagitis: Secondary | ICD-10-CM | POA: Insufficient documentation

## 2017-10-12 DIAGNOSIS — Z79899 Other long term (current) drug therapy: Secondary | ICD-10-CM | POA: Insufficient documentation

## 2017-10-12 DIAGNOSIS — I10 Essential (primary) hypertension: Secondary | ICD-10-CM | POA: Diagnosis not present

## 2017-10-12 DIAGNOSIS — N61 Mastitis without abscess: Secondary | ICD-10-CM | POA: Diagnosis not present

## 2017-10-12 DIAGNOSIS — L723 Sebaceous cyst: Secondary | ICD-10-CM | POA: Diagnosis not present

## 2017-10-12 HISTORY — PX: BREAST DUCTAL SYSTEM EXCISION: SHX5242

## 2017-10-12 SURGERY — EXCISION DUCTAL SYSTEM BREAST
Anesthesia: General | Site: Breast | Laterality: Bilateral

## 2017-10-12 MED ORDER — GABAPENTIN 300 MG PO CAPS
300.0000 mg | ORAL_CAPSULE | ORAL | Status: AC
  Administered 2017-10-12: 300 mg via ORAL

## 2017-10-12 MED ORDER — DEXMEDETOMIDINE HCL 200 MCG/2ML IV SOLN
INTRAVENOUS | Status: DC | PRN
  Administered 2017-10-12 (×2): 8 ug via INTRAVENOUS

## 2017-10-12 MED ORDER — OXYCODONE HCL 5 MG PO TABS: 5.0000 mg | ORAL_TABLET | Freq: Four times a day (QID) | ORAL | 0 refills | Status: DC | PRN

## 2017-10-12 MED ORDER — BUPIVACAINE HCL (PF) 0.25 % IJ SOLN
INTRAMUSCULAR | Status: AC
  Filled 2017-10-12: qty 30

## 2017-10-12 MED ORDER — ONDANSETRON HCL 4 MG/2ML IJ SOLN
INTRAMUSCULAR | Status: DC | PRN
  Administered 2017-10-12 (×2): 4 mg via INTRAVENOUS

## 2017-10-12 MED ORDER — LACTATED RINGERS IV SOLN
INTRAVENOUS | Status: DC | PRN
  Administered 2017-10-12 (×2): via INTRAVENOUS

## 2017-10-12 MED ORDER — ACETAMINOPHEN 500 MG PO TABS
ORAL_TABLET | ORAL | Status: AC
  Administered 2017-10-12: 1000 mg via ORAL
  Filled 2017-10-12: qty 2

## 2017-10-12 MED ORDER — MIDAZOLAM HCL 2 MG/2ML IJ SOLN
INTRAMUSCULAR | Status: DC | PRN
  Administered 2017-10-12: 2 mg via INTRAVENOUS

## 2017-10-12 MED ORDER — LIDOCAINE 2% (20 MG/ML) 5 ML SYRINGE
INTRAMUSCULAR | Status: DC | PRN
  Administered 2017-10-12: 100 mg via INTRAVENOUS

## 2017-10-12 MED ORDER — CHLORHEXIDINE GLUCONATE CLOTH 2 % EX PADS: 6.0000 | MEDICATED_PAD | Freq: Once | CUTANEOUS | Status: DC

## 2017-10-12 MED ORDER — PHENYLEPHRINE HCL 10 MG/ML IJ SOLN: INTRAVENOUS | Status: DC | PRN

## 2017-10-12 MED ORDER — 0.9 % SODIUM CHLORIDE (POUR BTL) OPTIME
TOPICAL | Status: DC | PRN
  Administered 2017-10-12: 1000 mL

## 2017-10-12 MED ORDER — PROPOFOL 10 MG/ML IV BOLUS
INTRAVENOUS | Status: AC
  Filled 2017-10-12: qty 20

## 2017-10-12 MED ORDER — CELECOXIB 200 MG PO CAPS
ORAL_CAPSULE | ORAL | Status: AC
  Administered 2017-10-12: 200 mg via ORAL
  Filled 2017-10-12: qty 1

## 2017-10-12 MED ORDER — PROPOFOL 10 MG/ML IV BOLUS
INTRAVENOUS | Status: DC | PRN
  Administered 2017-10-12: 200 mg via INTRAVENOUS

## 2017-10-12 MED ORDER — SCOPOLAMINE 1 MG/3DAYS TD PT72
MEDICATED_PATCH | TRANSDERMAL | Status: DC
Start: 2017-10-12 — End: 2017-10-12
  Administered 2017-10-12: 1.5 mg via TRANSDERMAL
  Filled 2017-10-12: qty 1

## 2017-10-12 MED ORDER — SCOPOLAMINE 1 MG/3DAYS TD PT72
1.0000 | MEDICATED_PATCH | TRANSDERMAL | Status: DC
  Administered 2017-10-12: 1.5 mg via TRANSDERMAL

## 2017-10-12 MED ORDER — OXYCODONE HCL 5 MG/5ML PO SOLN: 5.0000 mg | Freq: Once | ORAL | Status: DC | PRN

## 2017-10-12 MED ORDER — PHENYLEPHRINE HCL 10 MG/ML IJ SOLN
INTRAMUSCULAR | Status: DC | PRN
  Administered 2017-10-12 (×2): 80 ug via INTRAVENOUS

## 2017-10-12 MED ORDER — ACETAMINOPHEN 500 MG PO TABS
1000.0000 mg | ORAL_TABLET | ORAL | Status: AC
  Administered 2017-10-12: 1000 mg via ORAL

## 2017-10-12 MED ORDER — FENTANYL CITRATE (PF) 250 MCG/5ML IJ SOLN
INTRAMUSCULAR | Status: AC
  Filled 2017-10-12: qty 5

## 2017-10-12 MED ORDER — OXYCODONE HCL 5 MG PO TABS: 5.0000 mg | ORAL_TABLET | Freq: Once | ORAL | 0 refills | Status: DC | PRN

## 2017-10-12 MED ORDER — MIDAZOLAM HCL 2 MG/2ML IJ SOLN
INTRAMUSCULAR | Status: AC
  Filled 2017-10-12: qty 2

## 2017-10-12 MED ORDER — CELECOXIB 200 MG PO CAPS
200.0000 mg | ORAL_CAPSULE | ORAL | Status: AC
  Administered 2017-10-12: 200 mg via ORAL

## 2017-10-12 MED ORDER — IBUPROFEN 800 MG PO TABS: 800.0000 mg | ORAL_TABLET | Freq: Three times a day (TID) | ORAL | 0 refills | Status: DC | PRN

## 2017-10-12 MED ORDER — FENTANYL CITRATE (PF) 250 MCG/5ML IJ SOLN
INTRAMUSCULAR | Status: DC | PRN
  Administered 2017-10-12 (×5): 50 ug via INTRAVENOUS

## 2017-10-12 MED ORDER — DEXAMETHASONE SODIUM PHOSPHATE 10 MG/ML IJ SOLN
INTRAMUSCULAR | Status: DC | PRN
  Administered 2017-10-12: 10 mg via INTRAVENOUS

## 2017-10-12 MED ORDER — HYDROMORPHONE HCL 1 MG/ML IJ SOLN: 0.2500 mg | INTRAMUSCULAR | Status: DC | PRN

## 2017-10-12 MED ORDER — OXYCODONE HCL 5 MG PO TABS: 5.0000 mg | ORAL_TABLET | Freq: Once | ORAL | Status: DC | PRN

## 2017-10-12 MED ORDER — EPHEDRINE SULFATE 50 MG/ML IJ SOLN
INTRAMUSCULAR | Status: DC | PRN
  Administered 2017-10-12 (×3): 5 mg via INTRAVENOUS

## 2017-10-12 MED ORDER — GABAPENTIN 300 MG PO CAPS
ORAL_CAPSULE | ORAL | Status: AC
  Administered 2017-10-12: 300 mg via ORAL
  Filled 2017-10-12: qty 1

## 2017-10-12 MED ORDER — BUPIVACAINE HCL (PF) 0.25 % IJ SOLN
INTRAMUSCULAR | Status: DC | PRN
  Administered 2017-10-12: 16 mL
  Administered 2017-10-12: 20 mL

## 2017-10-12 SURGICAL SUPPLY — 42 items
BINDER BREAST 3XL (GAUZE/BANDAGES/DRESSINGS) ×2 IMPLANT
BLADE SURG 10 STRL SS (BLADE) ×2 IMPLANT
BLADE SURG 15 STRL LF DISP TIS (BLADE) ×1 IMPLANT
BLADE SURG 15 STRL SS (BLADE) ×1
CANISTER SUCT 3000ML PPV (MISCELLANEOUS) ×2 IMPLANT
CHLORAPREP W/TINT 26ML (MISCELLANEOUS) ×2 IMPLANT
COVER SURGICAL LIGHT HANDLE (MISCELLANEOUS) ×2 IMPLANT
DERMABOND ADVANCED (GAUZE/BANDAGES/DRESSINGS) ×2
DERMABOND ADVANCED .7 DNX12 (GAUZE/BANDAGES/DRESSINGS) ×2 IMPLANT
DRAPE LAPAROTOMY 100X72 PEDS (DRAPES) ×2 IMPLANT
DRAPE UTILITY XL STRL (DRAPES) ×4 IMPLANT
DRSG PAD ABDOMINAL 8X10 ST (GAUZE/BANDAGES/DRESSINGS) ×2 IMPLANT
ELECT CAUTERY BLADE 6.4 (BLADE) ×2 IMPLANT
ELECT REM PT RETURN 9FT ADLT (ELECTROSURGICAL) ×2
ELECTRODE REM PT RTRN 9FT ADLT (ELECTROSURGICAL) ×1 IMPLANT
GAUZE SPONGE 4X4 12PLY STRL (GAUZE/BANDAGES/DRESSINGS) ×2 IMPLANT
GLOVE BIO SURGEON STRL SZ8 (GLOVE) ×2 IMPLANT
GLOVE BIOGEL PI IND STRL 8 (GLOVE) ×1 IMPLANT
GLOVE BIOGEL PI INDICATOR 8 (GLOVE) ×1
GOWN STRL REUS W/ TWL LRG LVL3 (GOWN DISPOSABLE) ×2 IMPLANT
GOWN STRL REUS W/ TWL XL LVL3 (GOWN DISPOSABLE) ×1 IMPLANT
GOWN STRL REUS W/TWL LRG LVL3 (GOWN DISPOSABLE) ×2
GOWN STRL REUS W/TWL XL LVL3 (GOWN DISPOSABLE) ×1
KIT BASIN OR (CUSTOM PROCEDURE TRAY) ×2 IMPLANT
KIT ROOM TURNOVER OR (KITS) ×2 IMPLANT
NEEDLE HYPO 25GX1X1/2 BEV (NEEDLE) ×2 IMPLANT
NS IRRIG 1000ML POUR BTL (IV SOLUTION) ×2 IMPLANT
PACK SURGICAL SETUP 50X90 (CUSTOM PROCEDURE TRAY) ×2 IMPLANT
PAD ARMBOARD 7.5X6 YLW CONV (MISCELLANEOUS) ×4 IMPLANT
PENCIL BUTTON HOLSTER BLD 10FT (ELECTRODE) ×2 IMPLANT
SPONGE LAP 18X18 X RAY DECT (DISPOSABLE) ×2 IMPLANT
SPONGE LAP 4X18 X RAY DECT (DISPOSABLE) ×2 IMPLANT
SUT MNCRL AB 4-0 PS2 18 (SUTURE) ×4 IMPLANT
SUT VIC AB 3-0 SH 27 (SUTURE) ×2
SUT VIC AB 3-0 SH 27X BRD (SUTURE) ×2 IMPLANT
SYR BULB 3OZ (MISCELLANEOUS) ×2 IMPLANT
SYR CONTROL 10ML LL (SYRINGE) ×2 IMPLANT
TOWEL OR 17X24 6PK STRL BLUE (TOWEL DISPOSABLE) ×2 IMPLANT
TOWEL OR 17X26 10 PK STRL BLUE (TOWEL DISPOSABLE) ×2 IMPLANT
TUBE CONNECTING 12X1/4 (SUCTIONS) ×2 IMPLANT
WATER STERILE IRR 1000ML POUR (IV SOLUTION) IMPLANT
YANKAUER SUCT BULB TIP NO VENT (SUCTIONS) ×2 IMPLANT

## 2017-10-12 NOTE — Op Note (Signed)
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  Surgeon: Erroll Luna MD  Assistant: None  Anesthesia: LMA with 0.25% Sensorcaine local with epinephrine  Drains: None  EBL: 20 cc  Specimen: Bilateral central duct tissue to pathology  IV fluids: Per anesthesia record  Indications for procedure: The patient is a 46 year old female with chronic subareolar breast abscesses.  She has a history of multiple small abscesses that drain on their own and are cleared up with antibiotics but recur.  On examination she had 2 areas under both nipple areolar complexes were these are originating from in the central duct system.  I felt that excision was necessary at this point time since these are recurrent to medical management is failed.The procedure has been discussed with the patient. Alternatives to surgery have been discussed with the patient.  Risks of surgery include bleeding,  Infection,  Seroma formation, death,  and the need for further surgery.   The patient understands and wishes to proceed.    Description of procedure: The patient was met in the holding area.  Questions were answered.  The procedure was reviewed with the patient as well as risks and benefits.  She agreed to proceed.The procedure has been discussed with the patient. Alternatives to surgery have been discussed with the patient.  Risks of surgery include bleeding,  Infection,  Seroma formation, death,  and the need for further surgery.   The patient understands and wishes to proceed.   Patient was taken to the operating room.  She was placed supine.  After induction of LMA anesthesia both breasts were prepped and draped in sterile fashion.  Timeout was done.  She received 3 g of Ancef.  The left side was done first.  Local anesthetic was infiltrated in the  nipple areolar complex.  Curvilinear incision was made along the lateral border of the nipple areolar complex.  There is an area of thickened tissue under the nipple areolar complex contained within the central duct system.  This was all excised.  This was passed off the field.  It is cheap with cautery.  The wound was closed the deep layer of 3-0 Vicryl and 4-0 Monocryl.  The right breast was approached in a similar fashion.  After infiltration of local anesthetic under the nipple curvilinear incision was made on the lateral border of the nipple areolar complex.  Dissection was carried down in the central duct system.  There is a larger area of thickness where a chronic cavity and some element of a sebaceous cyst were localized.  There is all excised from the nipple.  This was passed off the field.  Hemostasis achieved with cautery.  Wound closed with 3-0 Vicryl and 4-0 Monocryl.  Dermabond applied to both incisions.  Breast binder placed.  Dry gauze placed on top of the incisions prior to placement of binder.  All final counts were found to be correct.  Patient was awoke extubated taken recovery in satisfactory condition.

## 2017-10-12 NOTE — Anesthesia Procedure Notes (Signed)
Procedure Name: LMA Insertion Date/Time: 10/12/2017 7:35 AM Performed by: Bryson Corona, CRNA Pre-anesthesia Checklist: Patient identified, Emergency Drugs available, Suction available and Patient being monitored Patient Re-evaluated:Patient Re-evaluated prior to induction Oxygen Delivery Method: Circle System Utilized Preoxygenation: Pre-oxygenation with 100% oxygen Induction Type: IV induction Ventilation: Mask ventilation without difficulty LMA: LMA inserted LMA Size: 5.0 Number of attempts: 1 Airway Equipment and Method: Bite block Placement Confirmation: positive ETCO2 Tube secured with: Tape Dental Injury: Teeth and Oropharynx as per pre-operative assessment

## 2017-10-12 NOTE — Anesthesia Postprocedure Evaluation (Signed)
Anesthesia Post Note  Patient: Alexandra Powers  Procedure(s) Performed: EXCISION OF BILATERAL CENTRAL DUCT (Bilateral Breast)     Patient location during evaluation: PACU Anesthesia Type: General Level of consciousness: awake and sedated Pain management: pain level controlled Vital Signs Assessment: post-procedure vital signs reviewed and stable Respiratory status: spontaneous breathing, nonlabored ventilation, respiratory function stable and patient connected to nasal cannula oxygen Cardiovascular status: blood pressure returned to baseline and stable Postop Assessment: no apparent nausea or vomiting Anesthetic complications: no    Last Vitals:  Vitals:   10/12/17 1025 10/12/17 1030  BP: (!) 132/91   Pulse: 83 88  Resp: 16 (!) 23  Temp:    SpO2: 97% 97%    Last Pain:  Vitals:   10/12/17 0856  TempSrc:   PainSc: 0-No pain                 Kalee Broxton,JAMES TERRILL

## 2017-10-12 NOTE — Interval H&P Note (Signed)
History and Physical Interval Note:  10/12/2017 7:13 AM  Alexandra Powers  has presented today for surgery, with the diagnosis of bilateral mastitis and abscess  The various methods of treatment have been discussed with the patient and family. After consideration of risks, benefits and other options for treatment, the patient has consented to  Procedure(s): EXCISION OF BILATERAL CENTRAL DUCT (Bilateral) as a surgical intervention .  The patient's history has been reviewed, patient examined, no change in status, stable for surgery.  I have reviewed the patient's chart and labs.  Questions were answered to the patient's satisfaction.     [name] A.

## 2017-10-12 NOTE — Transfer of Care (Signed)
Immediate Anesthesia Transfer of Care Note  Patient: Alexandra Powers  Procedure(s) Performed: EXCISION OF BILATERAL CENTRAL DUCT (Bilateral Breast)  Patient Location: PACU  Anesthesia Type:General  Level of Consciousness: awake, alert  and oriented  Airway & Oxygen Therapy: Patient Spontanous Breathing and Patient connected to face mask oxygen  Post-op Assessment: Report given to RN and Post -op Vital signs reviewed and stable  Post vital signs: Reviewed and stable  Last Vitals:  Vitals:   10/12/17 0552 10/12/17 0856  BP: 118/82 (!) 131/98  Pulse: 77 80  Resp: 20 18  Temp: 36.7 C   SpO2: 99% 95%    Last Pain:  Vitals:   10/12/17 0613  TempSrc:   PainSc: 4          Complications: No apparent anesthesia complications

## 2017-10-12 NOTE — Anesthesia Preprocedure Evaluation (Addendum)
Anesthesia Evaluation  Patient identified by MRN, date of birth, ID band Patient awake    Reviewed: Allergy & Precautions, NPO status   History of Anesthesia Complications (+) PONV and history of anesthetic complications  Airway Mallampati: I  TM Distance: >3 FB Neck ROM: Full    Dental no notable dental hx.    Pulmonary sleep apnea , Current Smoker,    breath sounds clear to auscultation       Cardiovascular hypertension,  Rhythm:Regular Rate:Normal     Neuro/Psych    GI/Hepatic GERD  ,  Endo/Other    Renal/GU      Musculoskeletal   Abdominal (+) + obese,   Peds  Hematology   Anesthesia Other Findings   Reproductive/Obstetrics                            Anesthesia Physical Anesthesia Plan  ASA: II  Anesthesia Plan: General   Post-op Pain Management:    Induction: Intravenous  PONV Risk Score and Plan: 4 or greater and Treatment may vary due to age or medical condition, Scopolamine patch - Pre-op, Ondansetron and Dexamethasone  Airway Management Planned: LMA  Additional Equipment:   Intra-op Plan:   Post-operative Plan: Extubation in OR  Informed Consent: I have reviewed the patients History and Physical, chart, labs and discussed the procedure including the risks, benefits and alternatives for the proposed anesthesia with the patient or authorized representative who has indicated his/her understanding and acceptance.   Dental advisory given  Plan Discussed with: CRNA  Anesthesia Plan Comments:         Anesthesia Quick Evaluation

## 2017-10-12 NOTE — Discharge Instructions (Signed)
[facility] °Office Phone Number [phone] ° °BREAST BIOPSY/ PARTIAL MASTECTOMY: POST OP INSTRUCTIONS ° °Always review your discharge instruction sheet given to you by the facility where your surgery was performed. ° °IF YOU HAVE DISABILITY OR FAMILY LEAVE FORMS, YOU MUST BRING THEM TO THE OFFICE FOR PROCESSING.  DO NOT GIVE THEM TO YOUR DOCTOR. ° °1. A prescription for pain medication may be given to you upon discharge.  Take your pain medication as prescribed, if needed.  If narcotic pain medicine is not needed, then you may take acetaminophen (Tylenol) or ibuprofen (Advil) as needed. °2. Take your usually prescribed medications unless otherwise directed °3. If you need a refill on your pain medication, please contact your pharmacy.  They will contact our office to request authorization.  Prescriptions will not be filled after 5pm or on week-ends. °4. You should eat very light the first 24 hours after surgery, such as soup, crackers, pudding, etc.  Resume your normal diet the day after surgery. °5. Most patients will experience some swelling and bruising in the breast.  Ice packs and a good support bra will help.  Swelling and bruising can take several days to resolve.  °6. It is common to experience some constipation if taking pain medication after surgery.  Increasing fluid intake and taking a stool softener will usually help or prevent this problem from occurring.  A mild laxative (Milk of Magnesia or Miralax) should be taken according to package directions if there are no bowel movements after 48 hours. °7. Unless discharge instructions indicate otherwise, you may remove your bandages 24-48 hours after surgery, and you may shower at that time.  You may have steri-strips (small skin tapes) in place directly over the incision.  These strips should be left on the skin for 7-10 days.  If your surgeon used skin glue on the incision, you may shower in 24 hours.  The glue will flake off over the  next 2-3 weeks.  Any sutures or staples will be removed at the office during your follow-up visit. °8. ACTIVITIES:  You may resume regular daily activities (gradually increasing) beginning the next day.  Wearing a good support bra or sports bra minimizes pain and swelling.  You may have sexual intercourse when it is comfortable. °a. You may drive when you no longer are taking prescription pain medication, you can comfortably wear a seatbelt, and you can safely maneuver your car and apply brakes. °b. RETURN TO WORK:  ______________________________________________________________________________________ °9. You should see your doctor in the office for a follow-up appointment approximately two weeks after your surgery.  Your doctor’s nurse will typically make your follow-up appointment when she calls you with your pathology report.  Expect your pathology report 2-3 business days after your surgery.  You may call to check if you do not hear from us after three days. °10. OTHER INSTRUCTIONS: _______________________________________________________________________________________________ _____________________________________________________________________________________________________________________________________ °_____________________________________________________________________________________________________________________________________ °_____________________________________________________________________________________________________________________________________ ° °WHEN TO CALL YOUR DOCTOR: °1. Fever over 101.0 °2. Nausea and/or vomiting. °3. Extreme swelling or bruising. °4. Continued bleeding from incision. °5. Increased pain, redness, or drainage from the incision. ° °The clinic staff is available to answer your questions during regular business hours.  Please don’t hesitate to call and ask to speak to one of the nurses for clinical concerns.  If you have a medical emergency, go to the nearest  emergency room or call 911.  A surgeon from [facility] is always on call at the hospital. ° °For further questions, please visit [email or web address]  °

## 2017-10-13 ENCOUNTER — Encounter (HOSPITAL_COMMUNITY): Payer: Self-pay | Admitting: Surgery

## 2017-10-17 ENCOUNTER — Telehealth: Payer: Self-pay | Admitting: General Surgery

## 2017-10-17 NOTE — Telephone Encounter (Signed)
Pt called reporting some blood tinged fluid draining from wound.  No erythema noted.  No increase in pain.  Recommended changing dressing as needed.  Call the office in AM if symptoms worsen or do not improve.  Rosario Adie, MD  [facility]

## 2017-10-20 DIAGNOSIS — M533 Sacrococcygeal disorders, not elsewhere classified: Secondary | ICD-10-CM | POA: Diagnosis not present

## 2017-11-15 ENCOUNTER — Encounter: Payer: Self-pay | Admitting: Internal Medicine

## 2017-11-15 ENCOUNTER — Other Ambulatory Visit (INDEPENDENT_AMBULATORY_CARE_PROVIDER_SITE_OTHER): Payer: BLUE CROSS/BLUE SHIELD

## 2017-11-15 DIAGNOSIS — E876 Hypokalemia: Secondary | ICD-10-CM

## 2017-11-15 LAB — BASIC METABOLIC PANEL
BUN: 14 mg/dL (ref 6–23)
CALCIUM: 9.1 mg/dL (ref 8.4–10.5)
CO2: 26 meq/L (ref 19–32)
Chloride: 107 mEq/L (ref 96–112)
Creatinine, Ser: 1.23 mg/dL — ABNORMAL HIGH (ref 0.40–1.20)
GFR: 60.07 mL/min (ref >60.00)
Glucose, Bld: 84 mg/dL (ref 70–99)
POTASSIUM: 3.6 meq/L (ref 3.5–5.1)
SODIUM: 141 meq/L (ref 135–145)

## 2017-11-17 ENCOUNTER — Other Ambulatory Visit (HOSPITAL_COMMUNITY)
Admission: RE | Admit: 2017-11-17 | Discharge: 2017-11-17 | Disposition: A | Discharge status: Home / Self Care | Payer: BLUE CROSS/BLUE SHIELD | Source: Ambulatory Visit | Attending: Obstetrics and Gynecology | Admitting: Obstetrics and Gynecology

## 2017-11-17 ENCOUNTER — Encounter: Payer: Self-pay | Admitting: Obstetrics and Gynecology

## 2017-11-17 ENCOUNTER — Ambulatory Visit: Payer: BLUE CROSS/BLUE SHIELD | Admitting: Obstetrics and Gynecology

## 2017-11-17 ENCOUNTER — Other Ambulatory Visit: Payer: Self-pay

## 2017-11-17 VITALS — BP 124/78 | HR 84 | Resp 18 | Ht 68.75 in | Wt 294.0 lb

## 2017-11-17 DIAGNOSIS — N914 Secondary oligomenorrhea: Secondary | ICD-10-CM | POA: Diagnosis not present

## 2017-11-17 DIAGNOSIS — Z124 Encounter for screening for malignant neoplasm of cervix: Secondary | ICD-10-CM | POA: Insufficient documentation

## 2017-11-17 DIAGNOSIS — R197 Diarrhea, unspecified: Secondary | ICD-10-CM

## 2017-11-17 DIAGNOSIS — R102 Pelvic and perineal pain: Secondary | ICD-10-CM | POA: Diagnosis not present

## 2017-11-17 DIAGNOSIS — L680 Hirsutism: Secondary | ICD-10-CM | POA: Diagnosis not present

## 2017-11-17 DIAGNOSIS — N949 Unspecified condition associated with female genital organs and menstrual cycle: Secondary | ICD-10-CM | POA: Diagnosis not present

## 2017-11-17 DIAGNOSIS — Z01419 Encounter for gynecological examination (general) (routine) without abnormal findings: Secondary | ICD-10-CM

## 2017-11-17 DIAGNOSIS — N941 Unspecified dyspareunia: Secondary | ICD-10-CM | POA: Diagnosis not present

## 2017-11-17 MED ORDER — DOXYCYCLINE HYCLATE 100 MG PO CAPS: 100.0000 mg | ORAL_CAPSULE | Freq: Two times a day (BID) | ORAL | 0 refills | Status: DC

## 2017-11-17 NOTE — Patient Instructions (Signed)
EXERCISE AND DIET:  We recommended that you start or continue a regular exercise program for good health. Regular exercise means any activity that makes your heart beat faster and makes you sweat.  We recommend exercising at least 30 minutes per day at least 3 days a week, preferably 4 or 5.  We also recommend a diet low in fat and sugar.  Inactivity, poor dietary choices and obesity can cause diabetes, heart attack, stroke, and kidney damage, among others.    ALCOHOL AND SMOKING:  Women should limit their alcohol intake to no more than 7 drinks/beers/glasses of wine (combined, not each!) per week. Moderation of alcohol intake to this level decreases your risk of breast cancer and liver damage. And of course, no recreational drugs are part of a healthy lifestyle.  And absolutely no smoking or even second hand smoke. Most people know smoking can cause heart and lung diseases, but did you know it also contributes to weakening of your bones? Aging of your skin?  Yellowing of your teeth and nails?  CALCIUM AND VITAMIN D:  Adequate intake of calcium and Vitamin D are recommended.  The recommendations for exact amounts of these supplements seem to change often, but generally speaking 600 mg of calcium (either carbonate or citrate) and 800 units of Vitamin D per day seems prudent. Certain women may benefit from higher intake of Vitamin D.  If you are among these women, your doctor will have told you during your visit.    PAP SMEARS:  Pap smears, to check for cervical cancer or precancers,  have traditionally been done yearly, although recent scientific advances have shown that most women can have pap smears less often.  However, every woman still should have a physical exam from her gynecologist every year. It will include a breast check, inspection of the vulva and vagina to check for abnormal growths or skin changes, a visual exam of the cervix, and then an exam to evaluate the size and shape of the uterus and  ovaries.  And after 47 years of age, a rectal exam is indicated to check for rectal cancers. We will also provide age appropriate advice regarding health maintenance, like when you should have certain vaccines, screening for sexually transmitted diseases, bone density testing, colonoscopy, mammograms, etc.   MAMMOGRAMS:  All women over 40 years old should have a yearly mammogram. Many facilities now offer a "3D" mammogram, which may cost around $50 extra out of pocket. If possible,  we recommend you accept the option to have the 3D mammogram performed.  It both reduces the number of women who will be called back for extra views which then turn out to be normal, and it is better than the routine mammogram at detecting truly abnormal areas.    COLONOSCOPY:  Colonoscopy to screen for colon cancer is recommended for all women at age 50.  We know, you hate the idea of the prep.  We agree, BUT, having colon cancer and not knowing it is worse!!  Colon cancer so often starts as a polyp that can be seen and removed at colonscopy, which can quite literally save your life!  And if your first colonoscopy is normal and you have no family history of colon cancer, most women don't have to have it again for 10 years.  Once every ten years, you can do something that may end up saving your life, right?  We will be happy to help you get it scheduled when you are ready.    Be sure to check your insurance coverage so you understand how much it will cost.  It may be covered as a preventative service at no cost, but you should check your particular policy.      Breast Self-Awareness Breast self-awareness means being familiar with how your breasts look and feel. It involves checking your breasts regularly and reporting any changes to your health care provider. Practicing breast self-awareness is important. A change in your breasts can be a sign of a serious medical problem. Being familiar with how your breasts look and feel allows  you to find any problems early, when treatment is more likely to be successful. All women should practice breast self-awareness, including women who have had breast implants. How to do a breast self-exam One way to learn what is normal for your breasts and whether your breasts are changing is to do a breast self-exam. To do a breast self-exam: Look for Changes  1. Remove all the clothing above your waist. 2. Stand in front of a mirror in a room with good lighting. 3. Put your hands on your hips. 4. Push your hands firmly downward. 5. Compare your breasts in the mirror. Look for differences between them (asymmetry), such as: ? Differences in shape. ? Differences in size. ? Puckers, dips, and bumps in one breast and not the other. 6. Look at each breast for changes in your skin, such as: ? Redness. ? Scaly areas. 7. Look for changes in your nipples, such as: ? Discharge. ? Bleeding. ? Dimpling. ? Redness. ? A change in position. Feel for Changes  Carefully feel your breasts for lumps and changes. It is best to do this while lying on your back on the floor and again while sitting or standing in the shower or tub with soapy water on your skin. Feel each breast in the following way:  Place the arm on the side of the breast you are examining above your head.  Feel your breast with the other hand.  Start in the nipple area and make  inch (2 cm) overlapping circles to feel your breast. Use the pads of your three middle fingers to do this. Apply light pressure, then medium pressure, then firm pressure. The light pressure will allow you to feel the tissue closest to the skin. The medium pressure will allow you to feel the tissue that is a little deeper. The firm pressure will allow you to feel the tissue close to the ribs.  Continue the overlapping circles, moving downward over the breast until you feel your ribs below your breast.  Move one finger-width toward the center of the body.  Continue to use the  inch (2 cm) overlapping circles to feel your breast as you move slowly up toward your collarbone.  Continue the up and down exam using all three pressures until you reach your armpit.  Write Down What You Find  Write down what is normal for each breast and any changes that you find. Keep a written record with breast changes or normal findings for each breast. By writing this information down, you do not need to depend only on memory for size, tenderness, or location. Write down where you are in your menstrual cycle, if you are still menstruating. If you are having trouble noticing differences in your breasts, do not get discouraged. With time you will become more familiar with the variations in your breasts and more comfortable with the exam. How often should I examine my breasts? Examine   your breasts every month. If you are breastfeeding, the best time to examine your breasts is after a feeding or after using a breast pump. If you menstruate, the best time to examine your breasts is 5-7 days after your period is over. During your period, your breasts are lumpier, and it may be more difficult to notice changes. When should I see my health care provider? See your health care provider if you notice:  A change in shape or size of your breasts or nipples.  A change in the skin of your breast or nipples, such as a reddened or scaly area.  Unusual discharge from your nipples.  A lump or thick area that was not there before.  Pain in your breasts.  Anything that concerns you.  This information is not intended to replace advice given to you by your health care provider. Make sure you discuss any questions you have with your health care provider. Document Released: 11/16/2005 Document Revised: 04/23/2016 Document Reviewed: 10/06/2015 Elsevier Interactive Patient Education  2018 Elsevier Inc.  

## 2017-11-17 NOTE — Progress Notes (Addendum)
46 y.o. [id number] MarriedAfrican AmericanF here for annual exam.  She c/o a one year h/o intermittent RLQ abdominal pain. Comes prior to her cycles. She is having pain with intercourse every time, deep inside on the right. Not positional in general.  She was told she had an ovarian cyst last year, didn't go back for f/u. She does report 4-5 BM's a day over the last year, some abdominal cramping with BM.  She c/o irregular for the last year. LMP was in August, prior to that it was a couple of months. Prior to the last year they were monthly. Only bleeds for a couple of days. She does c/o hot flashes and some night sweats.  Her youngest child is 58, no contraception since since about 2 years after that.  She has lost 136 lbs with medication, exercise, and dietary change over the last year.  She needs knee replacement, can't have it until she looses another 30 lbs. She has bad bilateral knee pain and bad back pain.  Period Duration (Days): 2 days when she has a cycle- no cycle since August Period Pattern: (!) Irregular Menstrual Flow: Moderate Menstrual Control: Maxi pad Menstrual Control Change Freq (Hours): changes pad every 3-4 hours  Dysmenorrhea: (!) Moderate Dysmenorrhea Symptoms: Cramping  Patient's last menstrual period was 06/30/2017 (approximate).          Sexually active: Yes.    The current method of family planning is none.    Exercising: No.  The patient does not participate in regular exercise at present. Smoker:  Yes, 1 pack in 2.5 days, she has cut down. Trying to quit.   Health Maintenance: Pap:  2017 WNL per patient  History of abnormal Pap:  no MMG:  08-22-17 breast infection  Colonoscopy:  Never BMD:   Never TDaP:  08-26-15 Gardasil: N/A   reports that she has been smoking cigarettes.  She has a 12.00 pack-year smoking history. she has never used smokeless tobacco. She reports that she drinks about 1.2 oz of alcohol per week. She reports that she uses drugs. Drug:  Marijuana. She isn't currently working, trying to get disability. 19 year old son, and 91 year old grand daughter. 2 grandchildren. All in [place].   Past Medical History:  Diagnosis Date  . Arthritis   . Gallstones   . GERD (gastroesophageal reflux disease)   . Hypertension   . Obesity   . PONV (postoperative nausea and vomiting)   . Sleep apnea    SENT CPAP BACK, NOT WORN IN 7-8 MONTHS    Past Surgical History:  Procedure Laterality Date  . BREAST DUCTAL SYSTEM EXCISION Bilateral 10/12/2017   Procedure: EXCISION OF BILATERAL CENTRAL DUCT;  Surgeon: Erroll Luna, MD;  Location: [facility];  Service: General;  Laterality: Bilateral;  . CHOLECYSTECTOMY N/A 12/21/2015   Procedure: LAPAROSCOPIC CHOLECYSTECTOMY;  Surgeon: Georganna Skeans, MD;  Location: [facility];  Service: General;  Laterality: N/A;  . UMBILICAL HERNIA REPAIR N/A 12/27/2015   Procedure: INCARCERATED UMBILICAL HERNIA REPAIR;  Surgeon: Coralie Keens, MD;  Location: [facility];  Service: General;  Laterality: N/A;    Current Outpatient Medications  Medication Sig Dispense Refill  . amLODipine (NORVASC) 10 MG tablet Take 1 tablet (10 mg total) by mouth daily. 90 tablet 3  . hyoscyamine (LEVSIN, ANASPAZ) 0.125 MG tablet Take 1 tablet (0.125 mg total) by mouth every 4 (four) hours as needed for cramping. 90 tablet 5  . ibuprofen (ADVIL,MOTRIN) 800 MG tablet Take 1 tablet (800 mg total)  every 8 (eight) hours as needed by mouth. 30 tablet 0  . Insulin Pen Needle (B-D ULTRAFINE III SHORT PEN) 31G X 8 MM MISC Use as directed daily with saxenda 30 each 5  . Liraglutide -Weight Management (SAXENDA) 18 MG/3ML SOPN Inject 3 mg into the skin daily. (Patient taking differently: Inject 7.2 mg daily into the skin. ) 3 pen 5  . lisinopril (PRINIVIL,ZESTRIL) 40 MG tablet Take 1 tablet (40 mg total) by mouth daily. 90 tablet 3  . potassium chloride SA (K-DUR,KLOR-CON) 20 MEQ tablet Take 1 tablet (20 mEq total) by mouth daily. 30 tablet 3  . ranitidine  (ZANTAC) 150 MG tablet Take 150 mg daily as needed by mouth for heartburn.     . tizanidine (ZANAFLEX) 6 MG capsule Take 1 capsule (6 mg total) by mouth 3 (three) times daily as needed for muscle spasms. 90 capsule 5   No current facility-administered medications for this visit.     Family History  Problem Relation Age of Onset  . Arthritis Mother   . Breast cancer Paternal Aunt   . Adrenal disorder Neg Hx   . Colon cancer Neg Hx   . Esophageal cancer Neg Hx   . Pancreatic cancer Neg Hx   . Stomach cancer Neg Hx   . Liver disease Neg Hx     Review of Systems  Constitutional: Negative.   HENT: Negative.   Eyes: Negative.   Respiratory: Negative.   Cardiovascular: Negative.   Gastrointestinal: Negative.   Endocrine: Negative.   Genitourinary: Positive for dyspareunia.  Musculoskeletal: Negative.   Skin: Negative.   Allergic/Immunologic: Negative.   Neurological: Negative.   Psychiatric/Behavioral: Negative.     Exam:   BP 124/78 (BP Location: Right Arm, Patient Position: Sitting, Cuff Size: Large)   Pulse 84   Resp 18   Ht 5' 8.75" (1.746 m)   Wt 294 lb (133.4 kg)   LMP 06/30/2017 (Approximate)   BMI 43.73 kg/m   Weight change: @WEIGHTCHANGE @ Height:   Height: 5' 8.75" (174.6 cm)  Ht Readings from Last 3 Encounters:  11/17/17 5' 8.75" (1.746 m)  10/06/17 5\' 8"  (1.727 m)  09/22/17 5\' 8"  (1.727 m)    General appearance: alert, cooperative and appears stated age Head: Normocephalic, without obvious abnormality, atraumatic Neck: no adenopathy, supple, symmetrical, trachea midline and thyroid normal to inspection and palpation Lungs: clear to auscultation bilaterally Cardiovascular: regular rate and rhythm Breasts: normal appearance, no masses or tenderness Abdomen: soft, mildly tender in the RLQ, no rebound, no guarding, less tender with tensing abdominal muscles; non distended,  no masses,  no organomegaly Extremities: extremities normal, atraumatic, no cyanosis or  edema Skin: Skin color, texture, turgor normal. No rashes or lesions. She was noted to have hirsutism on her chin (on questioning she states it has gotten worse with time) Lymph nodes: Cervical, supraclavicular, and axillary nodes normal. No abnormal inguinal nodes palpated Neurologic: Grossly normal   Pelvic: External genitalia:  no lesions              Urethra:  normal appearing urethra with no masses, tenderness or lesions              Bartholins and Skenes: normal                 Vagina: normal appearing vagina with normal color and discharge, no lesions. Mild atrophy              Cervix: no cervical motion tenderness and no  lesions               Bimanual Exam:  Uterus:  anteverted, mobile, tender, normal sized              Adnexa: no masses, tender bilaterally, R>L               Rectovaginal: Confirms               Anus:  normal sphincter tone, no lesions  Chaperone was present for exam.  A:  Well Woman exam  Oligomenorrhea in the last year, some vasomotor symptoms  Deep dyspareunia on the right  Intermittent pelvic pain in the RLQ  Tender on pelvic exam, most tender in the right adnexa, but also with uterine and left adnexal tenderness  Hirsutism  H/O ovarian cysts  Diarrhea, pain with BM  P:   Pap with hpv, gc/ct  FSH, estradiol, TSH, prolactin, BhcG  Hirsutism labs  Return for a GYN ultrasound  Will refer to GI  Addendum: The patient was treated with doxycycline for possible endometritis

## 2017-11-19 LAB — CYTOLOGY - PAP
CHLAMYDIA, DNA PROBE: NEGATIVE
DIAGNOSIS: NEGATIVE
HPV (WINDOPATH): DETECTED — AB
HPV 16/18/45 GENOTYPING: NEGATIVE
NEISSERIA GONORRHEA: NEGATIVE

## 2017-11-21 LAB — PROLACTIN: Prolactin: 16.9 ng/mL (ref 4.8–23.3)

## 2017-11-21 LAB — FOLLICLE STIMULATING HORMONE: FSH: 62.2 m[IU]/mL

## 2017-11-21 LAB — BETA HCG QUANT (REF LAB)

## 2017-11-21 LAB — DHEA-SULFATE: DHEA-SO4: 16.5 ug/dL — ABNORMAL LOW (ref 41.2–243.7)

## 2017-11-21 LAB — TSH: TSH: 0.825 u[IU]/mL (ref 0.450–4.500)

## 2017-11-21 LAB — TESTOSTERONE: Testosterone: <3 ng/dL — ABNORMAL LOW (ref 8–48)

## 2017-11-21 LAB — 17-HYDROXYPROGESTERONE: 17-Hydroxyprogesterone: 21 ng/dL

## 2017-11-21 LAB — ESTRADIOL: Estradiol: 14.9 pg/mL

## 2017-11-24 ENCOUNTER — Telehealth: Payer: Self-pay | Admitting: Obstetrics and Gynecology

## 2017-11-24 NOTE — Telephone Encounter (Signed)
Spoke with patient regarding recommended ultrasound. Patient has been scheduled for ultrasound appointment in 12/07/17 with Dr Talbert Nan. Pateint aware of appointment date, arrival time and cancellation policy.  I advised patient we were unable to pre-certify appointment until after 11/30/17. Patient understood she will be called back after 11/30/17 with benefits information for the scheduled appointment.   cc: Dr Talbert Nan

## 2017-11-25 ENCOUNTER — Telehealth: Payer: Self-pay

## 2017-11-25 DIAGNOSIS — R7989 Other specified abnormal findings of blood chemistry: Secondary | ICD-10-CM

## 2017-11-25 NOTE — Telephone Encounter (Signed)
Spoke with patient. Results given. Referral placed to Endocrinology to Dr.[facility]. Aware she will be contacted to schedule an appointment. Add on form faxed to [facility] to add on HPV 16/18/45. Aware she will be contacted with these results when they return.  Routing to provider for final review. Patient agreeable to disposition. Will close encounter.

## 2017-11-25 NOTE — Telephone Encounter (Signed)
-----   Message from Salvadore Dom, MD sent at 11/24/2017  5:05 PM EST ----- Please inform +HPV and add 16/18/45 HPV testing. Depending on the results, she will either need a colposcopy or a f/u pap/hpv next year. Her DHEAS and testosterone levels were very low (not what I was expecting). I'm not sure if there is any significance to this, but please put in a referral to Endocrinology and let he know. Her [place] and estradiol were in a menopausal range, given her cycles, I think she is peri-menopausal. Not considered menopausal until she goes a year without a cycle.  The rest of her lab work was normal.

## 2017-12-07 ENCOUNTER — Ambulatory Visit (INDEPENDENT_AMBULATORY_CARE_PROVIDER_SITE_OTHER): Payer: BLUE CROSS/BLUE SHIELD

## 2017-12-07 ENCOUNTER — Ambulatory Visit (INDEPENDENT_AMBULATORY_CARE_PROVIDER_SITE_OTHER): Payer: BLUE CROSS/BLUE SHIELD | Admitting: Obstetrics and Gynecology

## 2017-12-07 ENCOUNTER — Encounter: Payer: Self-pay | Admitting: Obstetrics and Gynecology

## 2017-12-07 ENCOUNTER — Other Ambulatory Visit: Payer: Self-pay

## 2017-12-07 VITALS — BP 118/80 | HR 88 | Resp 18 | Wt 299.0 lb

## 2017-12-07 DIAGNOSIS — N941 Unspecified dyspareunia: Secondary | ICD-10-CM

## 2017-12-07 DIAGNOSIS — R102 Pelvic and perineal pain: Secondary | ICD-10-CM

## 2017-12-07 NOTE — Progress Notes (Signed)
      Review of Systems  Constitutional: Negative.   HENT: Negative.   Eyes: Negative.   Respiratory: Negative.   Cardiovascular: Negative.   Gastrointestinal: Negative.   Genitourinary: Negative.   Musculoskeletal: Negative.   Skin: Negative.   Neurological: Negative.   Endo/Heme/Allergies: Negative.   Psychiatric/Behavioral: Negative.     The patient left without being seen, within 15-20 minutes of her appointment time.

## 2017-12-09 ENCOUNTER — Telehealth: Payer: Self-pay | Admitting: *Deleted

## 2017-12-09 NOTE — Telephone Encounter (Signed)
Spoke with patient and gave results. Patient states that she is still having the same pain even after the course of antibiotics.

## 2017-12-09 NOTE — Telephone Encounter (Signed)
-----   Message from Alexandra Dom, MD sent at 12/09/2017  8:26 AM EST ----- Please let the patient know that her ultrasound was normal. See if her pain is any better after the course of antibiotics.

## 2017-12-11 NOTE — Telephone Encounter (Signed)
I would be happy to see the patient and discuss potential options of treatment with her. I would like to repeat her pelvic exam since she has been treated with antibiotics, even though her pain is still there.

## 2017-12-13 NOTE — Telephone Encounter (Signed)
Spoke with patient- scheduled for 12-15-17-eh

## 2017-12-14 DIAGNOSIS — M47817 Spondylosis without myelopathy or radiculopathy, lumbosacral region: Secondary | ICD-10-CM | POA: Diagnosis not present

## 2017-12-14 DIAGNOSIS — Z6841 Body Mass Index (BMI) 40.0 and over, adult: Secondary | ICD-10-CM | POA: Diagnosis not present

## 2017-12-14 DIAGNOSIS — I10 Essential (primary) hypertension: Secondary | ICD-10-CM | POA: Diagnosis not present

## 2017-12-14 DIAGNOSIS — M533 Sacrococcygeal disorders, not elsewhere classified: Secondary | ICD-10-CM | POA: Diagnosis not present

## 2017-12-15 ENCOUNTER — Other Ambulatory Visit: Payer: Self-pay

## 2017-12-15 ENCOUNTER — Ambulatory Visit (INDEPENDENT_AMBULATORY_CARE_PROVIDER_SITE_OTHER): Payer: BLUE CROSS/BLUE SHIELD | Admitting: Obstetrics and Gynecology

## 2017-12-15 ENCOUNTER — Encounter: Payer: Self-pay | Admitting: Obstetrics and Gynecology

## 2017-12-15 VITALS — BP 128/80 | HR 64 | Resp 18 | Wt 297.0 lb

## 2017-12-15 DIAGNOSIS — N951 Menopausal and female climacteric states: Secondary | ICD-10-CM

## 2017-12-15 DIAGNOSIS — N914 Secondary oligomenorrhea: Secondary | ICD-10-CM | POA: Diagnosis not present

## 2017-12-15 DIAGNOSIS — R7989 Other specified abnormal findings of blood chemistry: Secondary | ICD-10-CM

## 2017-12-15 DIAGNOSIS — N941 Unspecified dyspareunia: Secondary | ICD-10-CM | POA: Diagnosis not present

## 2017-12-15 DIAGNOSIS — R102 Pelvic and perineal pain: Secondary | ICD-10-CM

## 2017-12-15 DIAGNOSIS — M6289 Other specified disorders of muscle: Secondary | ICD-10-CM

## 2017-12-15 MED ORDER — MEDROXYPROGESTERONE ACETATE 5 MG PO TABS: ORAL_TABLET | ORAL | 1 refills | Status: DC

## 2017-12-15 NOTE — Progress Notes (Signed)
GYNECOLOGY  VISIT   HPI: 47 y.o.   Married  [place] American  female   [phone] with Patient's last menstrual period was 07/07/2017.   here for follow up pelvic pain. She has deep dyspareunia, every time. Intermittent RLQ abdominal pain. She had a normal ultrasound last week. She was treated with antibiotics for possible endometritis.  Negative GC/CT testing.    She continues to have deep dyspareunia since being treated for endometritis, is somewhat positional, but always uncomfortable. She isn't able to enjoy intercourse (same partner since she was 57).  Not having orgasms.  She does report 4-5 BM's a day over the last year with some abdominal cramping with BM, GI referral was placed. Currently reports 3-4 BM's a day, not diarrhea currently.  Patient has been referred to endocrinology for low DHEAS and testosterone levels.  Her [place] was in a menopausal range. Normal TSH and Prolactin.   GYNECOLOGIC HISTORY: Patient's last menstrual period was 07/07/2017. Contraception:none  Menopausal hormone therapy: none         OB History    Gravida Para Term Preterm AB Living   2 2   2   2    SAB TAB Ectopic Multiple Live Births           2         Patient Active Problem List   Diagnosis Date Noted  . Hypokalemia 09/03/2017  . Acute prerenal failure ([place]) 09/03/2017  . OSA (obstructive sleep apnea) 12/10/2016  . GERD (gastroesophageal reflux disease) 12/04/2016  . Hypersomnia 10/29/2016  . Back pain 08/21/2016  . Chronic pain of multiple joints 08/13/2016  . MGUS (monoclonal gammopathy of unknown significance) 07/02/2016  . Vitamin D deficiency 07/02/2016  . Diarrhea 07/02/2016  . Joint pain 04/03/2016  . HTN (hypertension), benign 11/11/2015  . Morbid obesity ([place]) 11/11/2015  . Adrenal mass ([place]) 08/30/2015  . Ovarian cyst 08/30/2015    Past Medical History:  Diagnosis Date  . Arthritis   . Gallstones   . GERD (gastroesophageal reflux disease)   . Hypertension   . Obesity   .  PONV (postoperative nausea and vomiting)   . Sleep apnea    SENT CPAP BACK, NOT WORN IN 7-8 MONTHS    Past Surgical History:  Procedure Laterality Date  . BREAST DUCTAL SYSTEM EXCISION Bilateral 10/12/2017   Procedure: EXCISION OF BILATERAL CENTRAL DUCT;  Surgeon: Erroll Luna, MD;  Location: [place];  Service: General;  Laterality: Bilateral;  . CHOLECYSTECTOMY N/A 12/21/2015   Procedure: LAPAROSCOPIC CHOLECYSTECTOMY;  Surgeon: Georganna Skeans, MD;  Location: [place];  Service: General;  Laterality: N/A;  . UMBILICAL HERNIA REPAIR N/A 12/27/2015   Procedure: INCARCERATED UMBILICAL HERNIA REPAIR;  Surgeon: Coralie Keens, MD;  Location: [place];  Service: General;  Laterality: N/A;    Current Outpatient Medications  Medication Sig Dispense Refill  . amLODipine (NORVASC) 10 MG tablet Take 1 tablet (10 mg total) by mouth daily. 90 tablet 3  . hyoscyamine (LEVSIN, ANASPAZ) 0.125 MG tablet Take 1 tablet (0.125 mg total) by mouth every 4 (four) hours as needed for cramping. 90 tablet 5  . ibuprofen (ADVIL,MOTRIN) 800 MG tablet Take 1 tablet (800 mg total) every 8 (eight) hours as needed by mouth. 30 tablet 0  . Insulin Pen Needle (B-D ULTRAFINE III SHORT PEN) 31G X 8 MM MISC Use as directed daily with saxenda 30 each 5  . Liraglutide -Weight Management (SAXENDA) 18 MG/3ML SOPN Inject 3 mg into the skin daily. (Patient taking differently:  Inject 7.2 mg daily into the skin. ) 3 pen 5  . lisinopril (PRINIVIL,ZESTRIL) 40 MG tablet Take 1 tablet (40 mg total) by mouth daily. 90 tablet 3  . potassium chloride SA (K-DUR,KLOR-CON) 20 MEQ tablet Take 1 tablet (20 mEq total) by mouth daily. 30 tablet 3  . tizanidine (ZANAFLEX) 6 MG capsule Take 1 capsule (6 mg total) by mouth 3 (three) times daily as needed for muscle spasms. 90 capsule 5  . ranitidine (ZANTAC) 150 MG tablet Take 150 mg daily as needed by mouth for heartburn.     . traMADol (ULTRAM) 50 MG tablet      No current facility-administered  medications for this visit.      ALLERGIES: Patient has no known allergies.  Family History  Problem Relation Age of Onset  . Arthritis Mother   . Breast cancer Paternal Aunt   . Adrenal disorder Neg Hx   . Colon cancer Neg Hx   . Esophageal cancer Neg Hx   . Pancreatic cancer Neg Hx   . Stomach cancer Neg Hx   . Liver disease Neg Hx     Social History   Socioeconomic History  . Marital status: Married    Spouse name: Not on file  . Number of children: 2  . Years of education: Not on file  . Highest education level: Not on file  Social Needs  . Financial resource strain: Not on file  . Food insecurity - worry: Not on file  . Food insecurity - inability: Not on file  . Transportation needs - medical: Not on file  . Transportation needs - non-medical: Not on file  Occupational History    Comment: unemploed  Tobacco Use  . Smoking status: Current Every Day Smoker    Packs/day: 0.50    Years: 24.00    Pack years: 12.00    Types: Cigarettes  . Smokeless tobacco: Never Used  Substance and Sexual Activity  . Alcohol use: Yes    Alcohol/week: 1.2 oz    Types: 2 Shots of liquor per week    Comment: per week  . Drug use: Yes    Types: Marijuana  . Sexual activity: Yes    Partners: Male    Birth control/protection: None  Other Topics Concern  . Not on file  Social History Narrative   Lives with husband, Berneta Sages.    Review of Systems  Constitutional: Negative.   HENT: Negative.   Eyes: Negative.   Respiratory: Negative.   Cardiovascular: Negative.   Gastrointestinal: Negative.   Genitourinary:       Pelvic pain  Musculoskeletal: Negative.   Skin: Negative.   Neurological: Negative.   Endo/Heme/Allergies: Negative.   Psychiatric/Behavioral: Negative.     PHYSICAL EXAMINATION:    BP 128/80 (BP Location: Right Arm, Patient Position: Sitting, Cuff Size: Normal)   Pulse 64   Resp 18   Wt 297 lb (134.7 kg)   LMP 07/07/2017   BMI 44.18 kg/m     General  appearance: alert, cooperative and appears stated age Abdomen: soft, mildly tender RLQ, no rebound, no guarding, non distended, no masses,  no organomegaly  Pelvic: External genitalia:  no lesions              Urethra:  normal appearing urethra with no masses, tenderness or lesions              Bartholins and Skenes: normal  Cervix: no cervical motion tenderness              Bimanual Exam:  Uterus:  normal size, contour, position, consistency, mobility, non-tender and anteverted              Adnexa: no masses, mildly tender on the right              Pelvic floor: mildly tender on the right  Chaperone was present for exam.  ASSESSMENT Deep dyspareunia, no longer with uterine tenderness, but does have some right pelvic floor tenderness RLQ abdominal pain, 3-4 BM's per day, negative GYN ultrasound and negative genprobe Perimenopausal, irregular cycles Orgasmic dysfunction, reading information given    PLAN Will refer to PT pelvic floor tenderness and dyspareunia Referral already placed for GI Will treat with cyclic provera    An After Visit Summary was printed and given to the patient.  ~20 minutes face to face time of which over 50% was spent in counseling.

## 2017-12-16 ENCOUNTER — Telehealth: Payer: Self-pay | Admitting: Obstetrics and Gynecology

## 2017-12-16 NOTE — Telephone Encounter (Signed)
Received call from [facility] with Dr Altheimer's office, in regards to the endocrinology referral received from our office.  Per [name], Dr Altheimer is asking for clarification as to why the patient is being referral to him. Please return call to [place] at [phone] with this information.  Forwarding to Reesa Chew, RN

## 2017-12-16 NOTE — Telephone Encounter (Signed)
Spoke with [name] at Dr.ALtheimer's office. [name] is unavailable. Message left for return call to the office.

## 2017-12-17 NOTE — Telephone Encounter (Signed)
Left message for Alexandra Powers to call Kaitlyn at [phone].

## 2017-12-17 NOTE — Telephone Encounter (Signed)
Alexandra Powers returned a call to [place].

## 2017-12-20 ENCOUNTER — Telehealth: Payer: Self-pay | Admitting: Obstetrics and Gynecology

## 2017-12-20 NOTE — Telephone Encounter (Signed)
Holly from [facility] is calling requesting that you fax over a paper referral with any office notes to fax [phone] in order to start the referral process.

## 2017-12-21 DIAGNOSIS — R102 Pelvic and perineal pain: Secondary | ICD-10-CM | POA: Diagnosis not present

## 2017-12-21 DIAGNOSIS — N941 Unspecified dyspareunia: Secondary | ICD-10-CM | POA: Diagnosis not present

## 2017-12-21 DIAGNOSIS — M6281 Muscle weakness (generalized): Secondary | ICD-10-CM | POA: Diagnosis not present

## 2017-12-21 DIAGNOSIS — M62838 Other muscle spasm: Secondary | ICD-10-CM | POA: Diagnosis not present

## 2017-12-21 NOTE — Telephone Encounter (Signed)
Requested referral with notes faxed to [facility] to the attention of [facility], as requested. Confirmation received showing the fax transmittal was successful. Ok to close encounter

## 2017-12-22 DIAGNOSIS — M25561 Pain in right knee: Secondary | ICD-10-CM | POA: Diagnosis not present

## 2017-12-22 DIAGNOSIS — M170 Bilateral primary osteoarthritis of knee: Secondary | ICD-10-CM | POA: Diagnosis not present

## 2017-12-22 DIAGNOSIS — M25562 Pain in left knee: Secondary | ICD-10-CM | POA: Diagnosis not present

## 2017-12-27 DIAGNOSIS — R102 Pelvic and perineal pain: Secondary | ICD-10-CM | POA: Diagnosis not present

## 2017-12-27 DIAGNOSIS — M62838 Other muscle spasm: Secondary | ICD-10-CM | POA: Diagnosis not present

## 2017-12-27 DIAGNOSIS — M6281 Muscle weakness (generalized): Secondary | ICD-10-CM | POA: Diagnosis not present

## 2017-12-27 DIAGNOSIS — N941 Unspecified dyspareunia: Secondary | ICD-10-CM | POA: Diagnosis not present

## 2017-12-28 NOTE — Telephone Encounter (Signed)
Left message for Alexandra Powers to call Kaitlyn at [phone].

## 2018-01-03 DIAGNOSIS — M6281 Muscle weakness (generalized): Secondary | ICD-10-CM | POA: Diagnosis not present

## 2018-01-03 DIAGNOSIS — M62838 Other muscle spasm: Secondary | ICD-10-CM | POA: Diagnosis not present

## 2018-01-03 DIAGNOSIS — R102 Pelvic and perineal pain: Secondary | ICD-10-CM | POA: Diagnosis not present

## 2018-01-03 DIAGNOSIS — M6289 Other specified disorders of muscle: Secondary | ICD-10-CM | POA: Diagnosis not present

## 2018-01-10 DIAGNOSIS — M47817 Spondylosis without myelopathy or radiculopathy, lumbosacral region: Secondary | ICD-10-CM | POA: Diagnosis not present

## 2018-01-12 DIAGNOSIS — M6289 Other specified disorders of muscle: Secondary | ICD-10-CM | POA: Diagnosis not present

## 2018-01-12 DIAGNOSIS — R102 Pelvic and perineal pain: Secondary | ICD-10-CM | POA: Diagnosis not present

## 2018-01-12 DIAGNOSIS — M6281 Muscle weakness (generalized): Secondary | ICD-10-CM | POA: Diagnosis not present

## 2018-01-12 DIAGNOSIS — M62838 Other muscle spasm: Secondary | ICD-10-CM | POA: Diagnosis not present

## 2018-01-13 NOTE — Telephone Encounter (Signed)
Patient is scheduled for 01/14/2018 with Dr.Altheimer. Encounter previously closed.

## 2018-01-14 DIAGNOSIS — R7989 Other specified abnormal findings of blood chemistry: Secondary | ICD-10-CM | POA: Diagnosis not present

## 2018-01-14 DIAGNOSIS — N951 Menopausal and female climacteric states: Secondary | ICD-10-CM | POA: Diagnosis not present

## 2018-01-14 DIAGNOSIS — F17200 Nicotine dependence, unspecified, uncomplicated: Secondary | ICD-10-CM | POA: Diagnosis not present

## 2018-01-17 DIAGNOSIS — M47817 Spondylosis without myelopathy or radiculopathy, lumbosacral region: Secondary | ICD-10-CM | POA: Diagnosis not present

## 2018-01-18 ENCOUNTER — Ambulatory Visit: Payer: BLUE CROSS/BLUE SHIELD | Admitting: Internal Medicine

## 2018-01-21 DIAGNOSIS — M6289 Other specified disorders of muscle: Secondary | ICD-10-CM | POA: Diagnosis not present

## 2018-01-21 DIAGNOSIS — M6281 Muscle weakness (generalized): Secondary | ICD-10-CM | POA: Diagnosis not present

## 2018-01-21 DIAGNOSIS — R102 Pelvic and perineal pain: Secondary | ICD-10-CM | POA: Diagnosis not present

## 2018-01-21 DIAGNOSIS — M62838 Other muscle spasm: Secondary | ICD-10-CM | POA: Diagnosis not present

## 2018-01-24 ENCOUNTER — Encounter: Payer: Self-pay | Admitting: Obstetrics and Gynecology

## 2018-02-10 ENCOUNTER — Encounter (INDEPENDENT_AMBULATORY_CARE_PROVIDER_SITE_OTHER): Payer: BLUE CROSS/BLUE SHIELD

## 2018-02-10 DIAGNOSIS — M170 Bilateral primary osteoarthritis of knee: Secondary | ICD-10-CM | POA: Diagnosis not present

## 2018-02-14 DIAGNOSIS — N941 Unspecified dyspareunia: Secondary | ICD-10-CM | POA: Diagnosis not present

## 2018-02-14 DIAGNOSIS — M62838 Other muscle spasm: Secondary | ICD-10-CM | POA: Diagnosis not present

## 2018-02-14 DIAGNOSIS — R102 Pelvic and perineal pain: Secondary | ICD-10-CM | POA: Diagnosis not present

## 2018-02-14 DIAGNOSIS — M6281 Muscle weakness (generalized): Secondary | ICD-10-CM | POA: Diagnosis not present

## 2018-02-15 ENCOUNTER — Encounter (INDEPENDENT_AMBULATORY_CARE_PROVIDER_SITE_OTHER): Payer: BLUE CROSS/BLUE SHIELD

## 2018-02-28 ENCOUNTER — Ambulatory Visit (INDEPENDENT_AMBULATORY_CARE_PROVIDER_SITE_OTHER): Payer: BLUE CROSS/BLUE SHIELD | Admitting: Family Medicine

## 2018-02-28 DIAGNOSIS — M170 Bilateral primary osteoarthritis of knee: Secondary | ICD-10-CM | POA: Diagnosis not present

## 2018-03-07 ENCOUNTER — Ambulatory Visit (INDEPENDENT_AMBULATORY_CARE_PROVIDER_SITE_OTHER): Payer: BLUE CROSS/BLUE SHIELD | Admitting: Family Medicine

## 2018-03-07 ENCOUNTER — Encounter (INDEPENDENT_AMBULATORY_CARE_PROVIDER_SITE_OTHER): Payer: Self-pay

## 2018-03-07 NOTE — Progress Notes (Signed)
Subjective:    [name], female    DOB: 10/15/1971, 47 y.o.   MRN: [id number]  HPI   Medications and allergies reviewed with patient and updated if appropriate.  Patient Active Problem List   Diagnosis Date Noted  . Hypokalemia 09/03/2017  . Acute prerenal failure ([place]) 09/03/2017  . OSA (obstructive sleep apnea) 12/10/2016  . GERD (gastroesophageal reflux disease) 12/04/2016  . Hypersomnia 10/29/2016  . Back pain 08/21/2016  . Chronic pain of multiple joints 08/13/2016  . MGUS (monoclonal gammopathy of unknown significance) 07/02/2016  . Vitamin D deficiency 07/02/2016  . Diarrhea 07/02/2016  . Joint pain 04/03/2016  . HTN (hypertension), benign 11/11/2015  . Morbid obesity ([place]) 11/11/2015  . Adrenal mass ([place]) 08/30/2015  . Ovarian cyst 08/30/2015    Current Outpatient Medications on File Prior to Visit  Medication Sig Dispense Refill  . amLODipine (NORVASC) 10 MG tablet Take 1 tablet (10 mg total) by mouth daily. 90 tablet 3  . hyoscyamine (LEVSIN, ANASPAZ) 0.125 MG tablet Take 1 tablet (0.125 mg total) by mouth every 4 (four) hours as needed for cramping. 90 tablet 5  . ibuprofen (ADVIL,MOTRIN) 800 MG tablet Take 1 tablet (800 mg total) every 8 (eight) hours as needed by mouth. 30 tablet 0  . Insulin Pen Needle (B-D ULTRAFINE III SHORT PEN) 31G X 8 MM MISC Use as directed daily with saxenda 30 each 5  . Liraglutide -Weight Management (SAXENDA) 18 MG/3ML SOPN Inject 3 mg into the skin daily. (Patient taking differently: Inject 7.2 mg daily into the skin. ) 3 pen 5  . lisinopril (PRINIVIL,ZESTRIL) 40 MG tablet Take 1 tablet (40 mg total) by mouth daily. 90 tablet 3  . medroxyPROGESTERone (PROVERA) 5 MG tablet Take one tablet a day for 5 days every other month if no spontaneous cycles 15 tablet 1  . potassium chloride SA (K-DUR,KLOR-CON) 20 MEQ tablet Take 1 tablet (20 mEq total) by mouth daily. 30 tablet 3  . ranitidine (ZANTAC) 150 MG tablet Take 150 mg  daily as needed by mouth for heartburn.     . tizanidine (ZANAFLEX) 6 MG capsule Take 1 capsule (6 mg total) by mouth 3 (three) times daily as needed for muscle spasms. 90 capsule 5  . traMADol (ULTRAM) 50 MG tablet     . [DISCONTINUED] losartan-hydrochlorothiazide (HYZAAR) 50-12.5 MG per tablet Take 1 tablet by mouth daily. 30 tablet 5   No current facility-administered medications on file prior to visit.     Past Medical History:  Diagnosis Date  . Arthritis   . Gallstones   . GERD (gastroesophageal reflux disease)   . Hypertension   . Obesity   . PONV (postoperative nausea and vomiting)   . Sleep apnea    SENT CPAP BACK, NOT WORN IN 7-8 MONTHS    Past Surgical History:  Procedure Laterality Date  . BREAST DUCTAL SYSTEM EXCISION Bilateral 10/12/2017   Procedure: EXCISION OF BILATERAL CENTRAL DUCT;  Surgeon: Erroll Luna, MD;  Location: [place];  Service: General;  Laterality: Bilateral;  . CHOLECYSTECTOMY N/A 12/21/2015   Procedure: LAPAROSCOPIC CHOLECYSTECTOMY;  Surgeon: Georganna Skeans, MD;  Location: [place];  Service: General;  Laterality: N/A;  . UMBILICAL HERNIA REPAIR N/A 12/27/2015   Procedure: INCARCERATED UMBILICAL HERNIA REPAIR;  Surgeon: Coralie Keens, MD;  Location: [place];  Service: General;  Laterality: N/A;    Social History   Socioeconomic History  . Marital status: Married    Spouse name: Not on file  .  Number of children: 2  . Years of education: Not on file  . Highest education level: Not on file  Occupational History    Comment: unemploed  Social Needs  . Financial resource strain: Not on file  . Food insecurity:    Worry: Not on file    Inability: Not on file  . Transportation needs:    Medical: Not on file    Non-medical: Not on file  Tobacco Use  . Smoking status: Current Every Day Smoker    Packs/day: 0.50    Years: 24.00    Pack years: 12.00    Types: Cigarettes  . Smokeless tobacco: Never Used  Substance and Sexual Activity  . Alcohol  use: Yes    Alcohol/week: 1.2 oz    Types: 2 Shots of liquor per week    Comment: per week  . Drug use: Yes    Types: Marijuana  . Sexual activity: Yes    Partners: Male    Birth control/protection: None  Lifestyle  . Physical activity:    Days per week: Not on file    Minutes per session: Not on file  . Stress: Not on file  Relationships  . Social connections:    Talks on phone: Not on file    Gets together: Not on file    Attends religious service: Not on file    Active member of club or organization: Not on file    Attends meetings of clubs or organizations: Not on file    Relationship status: Not on file  Other Topics Concern  . Not on file  Social History Narrative   Lives with husband, Berneta Sages.    Family History  Problem Relation Age of Onset  . Arthritis Mother   . Breast cancer Paternal Aunt   . Adrenal disorder Neg Hx   . Colon cancer Neg Hx   . Esophageal cancer Neg Hx   . Pancreatic cancer Neg Hx   . Stomach cancer Neg Hx   . Liver disease Neg Hx     Review of Systems     Objective:  There were no vitals filed for this visit. BP Readings from Last 3 Encounters:  12/15/17 128/80  12/07/17 118/80  11/17/17 124/78   Wt Readings from Last 3 Encounters:  12/15/17 297 lb (134.7 kg)  12/07/17 299 lb (135.6 kg)  11/17/17 294 lb (133.4 kg)   There is no height or weight on file to calculate BMI.   Physical Exam         Assessment & Plan:    See Problem List for Assessment and Plan of chronic medical problems.   This encounter was created in error - please disregard.

## 2018-03-08 ENCOUNTER — Encounter: Payer: BLUE CROSS/BLUE SHIELD | Admitting: Internal Medicine

## 2018-03-08 ENCOUNTER — Encounter: Payer: Self-pay | Admitting: Internal Medicine

## 2018-03-08 ENCOUNTER — Telehealth: Payer: Self-pay | Admitting: Emergency Medicine

## 2018-03-08 DIAGNOSIS — N611 Abscess of the breast and nipple: Secondary | ICD-10-CM

## 2018-03-08 DIAGNOSIS — Z0289 Encounter for other administrative examinations: Secondary | ICD-10-CM

## 2018-03-08 NOTE — Telephone Encounter (Signed)
Spoke with pt to inform.  

## 2018-03-08 NOTE — Telephone Encounter (Signed)
Pt came in for appt today and had to leave due to her husband having to be at work soon. Pt states she had surgery in Feb for the abscess. And it had come back. It is red and hot to the touch. She had left over penicillin at home and has been taking that for a few days. States she needs a referral to the breast center for a biopsy of the left breast.

## 2018-03-08 NOTE — Telephone Encounter (Signed)
Biopsy ordered.

## 2018-03-10 ENCOUNTER — Other Ambulatory Visit: Payer: Self-pay | Admitting: Internal Medicine

## 2018-03-10 DIAGNOSIS — N611 Abscess of the breast and nipple: Secondary | ICD-10-CM

## 2018-03-16 DIAGNOSIS — M25561 Pain in right knee: Secondary | ICD-10-CM | POA: Diagnosis not present

## 2018-03-16 DIAGNOSIS — M25562 Pain in left knee: Secondary | ICD-10-CM | POA: Diagnosis not present

## 2018-03-17 ENCOUNTER — Ambulatory Visit (INDEPENDENT_AMBULATORY_CARE_PROVIDER_SITE_OTHER): Payer: BLUE CROSS/BLUE SHIELD | Admitting: Family Medicine

## 2018-03-17 ENCOUNTER — Encounter (INDEPENDENT_AMBULATORY_CARE_PROVIDER_SITE_OTHER): Payer: Self-pay | Admitting: Family Medicine

## 2018-03-17 VITALS — BP 111/77 | HR 61 | Temp 97.9°F | Ht 69.0 in | Wt 280.0 lb

## 2018-03-17 DIAGNOSIS — Z9189 Other specified personal risk factors, not elsewhere classified: Secondary | ICD-10-CM

## 2018-03-17 DIAGNOSIS — E559 Vitamin D deficiency, unspecified: Secondary | ICD-10-CM

## 2018-03-17 DIAGNOSIS — R0602 Shortness of breath: Secondary | ICD-10-CM

## 2018-03-17 DIAGNOSIS — Z1331 Encounter for screening for depression: Secondary | ICD-10-CM | POA: Diagnosis not present

## 2018-03-17 DIAGNOSIS — Z6841 Body Mass Index (BMI) 40.0 and over, adult: Secondary | ICD-10-CM

## 2018-03-17 DIAGNOSIS — R7989 Other specified abnormal findings of blood chemistry: Secondary | ICD-10-CM | POA: Diagnosis not present

## 2018-03-17 DIAGNOSIS — R5383 Other fatigue: Secondary | ICD-10-CM | POA: Diagnosis not present

## 2018-03-18 ENCOUNTER — Other Ambulatory Visit: Payer: BLUE CROSS/BLUE SHIELD

## 2018-03-18 LAB — COMPREHENSIVE METABOLIC PANEL
ALT: 6 IU/L (ref 0–32)
AST: 14 IU/L (ref 0–40)
Albumin/Globulin Ratio: 1.1 — ABNORMAL LOW (ref 1.2–2.2)
Albumin: 3.9 g/dL (ref 3.5–5.5)
Alkaline Phosphatase: 71 IU/L (ref 39–117)
BUN/Creatinine Ratio: 10 (ref 9–23)
BUN: 12 mg/dL (ref 6–24)
Bilirubin Total: 0.4 mg/dL (ref 0.0–1.2)
CALCIUM: 9.7 mg/dL (ref 8.7–10.2)
CO2: 24 mmol/L (ref 20–29)
Chloride: 101 mmol/L (ref 96–106)
Creatinine, Ser: 1.2 mg/dL — ABNORMAL HIGH (ref 0.57–1.00)
GFR, EST AFRICAN AMERICAN: 62 mL/min/{1.73_m2} (ref >59)
GFR, EST NON AFRICAN AMERICAN: 54 mL/min/{1.73_m2} — AB (ref >59)
GLUCOSE: 92 mg/dL (ref 65–99)
Globulin, Total: 3.4 g/dL (ref 1.5–4.5)
Potassium: 3.6 mmol/L (ref 3.5–5.2)
Sodium: 138 mmol/L (ref 134–144)
TOTAL PROTEIN: 7.3 g/dL (ref 6.0–8.5)

## 2018-03-18 LAB — LIPID PANEL WITH LDL/HDL RATIO
Cholesterol, Total: 192 mg/dL (ref 100–199)
HDL: 47 mg/dL (ref >39)
LDL CALC: 130 mg/dL — AB (ref 0–99)
LDL/HDL RATIO: 2.8 ratio (ref 0.0–3.2)
Triglycerides: 75 mg/dL (ref 0–149)
VLDL CHOLESTEROL CAL: 15 mg/dL (ref 5–40)

## 2018-03-18 LAB — HEMOGLOBIN A1C
Est. average glucose Bld gHb Est-mCnc: 97 mg/dL
Hgb A1c MFr Bld: 5 % (ref 4.8–5.6)

## 2018-03-18 LAB — T3: T3, Total: 117 ng/dL (ref 71–180)

## 2018-03-18 LAB — VITAMIN D 25 HYDROXY (VIT D DEFICIENCY, FRACTURES): Vit D, 25-Hydroxy: 19.2 ng/mL — ABNORMAL LOW (ref 30.0–100.0)

## 2018-03-18 LAB — T4, FREE: Free T4: 1.38 ng/dL (ref 0.82–1.77)

## 2018-03-18 LAB — FOLATE: FOLATE: 4.1 ng/mL (ref >3.0)

## 2018-03-18 LAB — TSH: TSH: 0.628 u[IU]/mL (ref 0.450–4.500)

## 2018-03-18 LAB — VITAMIN B12: VITAMIN B 12: 364 pg/mL (ref 232–1245)

## 2018-03-18 LAB — INSULIN, RANDOM: INSULIN: 13.5 u[IU]/mL (ref 2.6–24.9)

## 2018-03-21 NOTE — Progress Notes (Signed)
Office: [phone]  /  Fax: [phone]   Dear Dr. Quay Burow,   Thank you for referring Alexandra Powers to our clinic. The following note includes my evaluation and treatment recommendations.  HPI:   Chief Complaint: OBESITY    Alexandra Powers has been referred by Binnie Rail, MD for consultation regarding her obesity and obesity related comorbidities.    Alexandra Powers (MR# [id number]) is a 47 y.o. female who presents on 03/21/2018 for obesity evaluation and treatment. Current BMI is Body mass index is 41.35 kg/m.Marland Kitchen Alexandra Powers has been struggling with her weight for many years and has been unsuccessful in either losing weight, maintaining weight loss, or reaching her healthy weight goal.     Khamille attended our information session and states she is currently in the action stage of change and ready to dedicate time achieving and maintaining a healthier weight with goal to lose weight to get bilateral knee replacement. Alexandra Powers is interested in becoming our patient and working on intensive lifestyle modifications including (but not limited to) diet, exercise and weight loss.    Alexandra Powers states her family eats meals together she thinks her family will eat healthier with  her her desired weight loss is 60 lbs she has been heavy most of  her life she started gaining weight in 1989 her heaviest weight ever was 425 lbs. she has significant food cravings issues  she snacks frequently in the evenings she skips meals frequently she is frequently drinking liquids with calories she frequently makes poor food choices she has binge eating behaviors she struggles with emotional eating    Fatigue Alexandra Powers feels her energy is lower than it should be. This has worsened with weight gain and has not worsened recently. Alexandra Powers admits to daytime somnolence and  admits to waking up still tired. Patient has a diagnosis of obstructive sleep apnea which may contribute to her fatigue. Patent has a history of symptoms of daytime fatigue and morning  fatigue. Patient generally gets 6 to 8 hours of sleep per night, and states they generally have restful sleep. Snoring is present. Apneic episodes are present. Epworth Sleepiness Score is 3  EKG was ordered today and shows sinus rhythm within normal limits.  Dyspnea on exertion Averey notes increasing shortness of breath with exercising and seems to be worsening over time with weight gain. She notes getting out of breath sooner with activity than she used to. This has not gotten worse recently. EKG was ordered today and shows sinus rhythm within normal limits. Georgina denies orthopnea.  Vitamin D deficiency Analena has a previous diagnosis of vitamin D deficiency. She is not currently taking vit D and denies nausea, vomiting or muscle weakness.  At risk for osteopenia and osteoporosis Alexandra Powers is at higher risk of osteopenia and osteoporosis due to vitamin D deficiency.   Elevated Creatinine Alexandra Powers has elevated creatinine level and was told this was secondary to Alexandra Powers.  Depression Screen Alexandra Powers's Food and Mood (modified PHQ-9) score was  Depression screen PHQ 2/9 03/17/2018  Decreased Interest 0  Down, Depressed, Hopeless 0  PHQ - 2 Score 0  Altered sleeping 0  Tired, decreased energy 1  Change in appetite 0  Feeling bad or failure about yourself  0  Trouble concentrating 0  Moving slowly or fidgety/restless 0  Suicidal thoughts 0  PHQ-9 Score 1  Difficult doing work/chores Not difficult at all    ALLERGIES: No Known Allergies  MEDICATIONS: Current Outpatient Medications on File Prior to Visit  Medication Sig Dispense  Refill  . amLODipine (NORVASC) 10 MG tablet Take 1 tablet (10 mg total) by mouth daily. 90 tablet 3  . hydrochlorothiazide (HYDRODIURIL) 25 MG tablet Take 25 mg by mouth daily.    Marland Kitchen lisinopril (PRINIVIL,ZESTRIL) 40 MG tablet Take 1 tablet (40 mg total) by mouth daily. 90 tablet 3  . [DISCONTINUED] losartan-hydrochlorothiazide (HYZAAR) 50-12.5 MG per tablet Take 1 tablet by mouth  daily. 30 tablet 5   No current facility-administered medications on file prior to visit.     PAST MEDICAL HISTORY: Past Medical History:  Diagnosis Date  . Arthritis   . Gallstones   . GERD (gastroesophageal reflux disease)   . Hypertension   . Obesity   . Osteoarthritis   . PONV (postoperative nausea and vomiting)   . Sleep apnea    SENT CPAP BACK, NOT WORN IN 7-8 MONTHS    PAST SURGICAL HISTORY: Past Surgical History:  Procedure Laterality Date  . BREAST DUCTAL SYSTEM EXCISION Bilateral 10/12/2017   Procedure: EXCISION OF BILATERAL CENTRAL DUCT;  Surgeon: Erroll Luna, MD;  Location: [facility];  Service: General;  Laterality: Bilateral;  . CHOLECYSTECTOMY N/A 12/21/2015   Procedure: LAPAROSCOPIC CHOLECYSTECTOMY;  Surgeon: Georganna Skeans, MD;  Location: [facility];  Service: General;  Laterality: N/A;  . UMBILICAL HERNIA REPAIR N/A 12/27/2015   Procedure: INCARCERATED UMBILICAL HERNIA REPAIR;  Surgeon: Coralie Keens, MD;  Location: [facility];  Service: General;  Laterality: N/A;    SOCIAL HISTORY: Social History   Tobacco Use  . Smoking status: Current Every Day Smoker    Packs/day: 0.50    Years: 24.00    Pack years: 12.00    Types: Cigarettes  . Smokeless tobacco: Never Used  Substance Use Topics  . Alcohol use: Yes    Alcohol/week: 2.4 oz    Types: 4 Shots of liquor per week    Comment: per week  . Drug use: Yes    Types: Marijuana    FAMILY HISTORY: Family History  Problem Relation Age of Onset  . Arthritis Mother   . High blood pressure Mother   . Sleep apnea Mother   . High blood pressure Father   . Breast cancer Paternal Aunt   . Adrenal disorder Neg Hx   . Colon cancer Neg Hx   . Esophageal cancer Neg Hx   . Pancreatic cancer Neg Hx   . Stomach cancer Neg Hx   . Liver disease Neg Hx     ROS: Review of Systems  Constitutional: Positive for malaise/fatigue.       Breasts discharge Breasts pain  Respiratory: Positive for shortness of breath (on  exertion).   Cardiovascular: Negative for orthopnea.  Gastrointestinal: Positive for heartburn. Negative for nausea and vomiting.  Musculoskeletal: Positive for back pain.       Negative for muscle weakness    PHYSICAL EXAM: Blood pressure 111/77, pulse 61, temperature 97.9 F (36.6 C), temperature source Oral, height 5\' 9"  (1.753 m), weight 280 lb (127 kg), SpO2 98 %. Body mass index is 41.35 kg/m. Physical Exam  Constitutional: She is oriented to person, place, and time. She appears well-developed and well-nourished.  HENT:  Head: Normocephalic and atraumatic.  Nose: Nose normal.  Eyes: EOM are normal. No scleral icterus.  Neck: Normal range of motion. Neck supple. No thyromegaly present.  Cardiovascular: Normal rate and regular rhythm.  Pulmonary/Chest: Effort normal. No respiratory distress.  Abdominal: Soft. There is no tenderness.  + obesity  Musculoskeletal: Normal range of motion.  Range of  Motion normal in all 4 extremities  Neurological: She is alert and oriented to person, place, and time. Coordination normal.  Skin: Skin is warm and dry.  Psychiatric: She has a normal mood and affect. Her behavior is normal.  Vitals reviewed.   RECENT LABS AND TESTS: BMET    Component Value Date/Time   NA 138 03/17/2018 1138   NA 142 08/05/2017 1057   K 3.6 03/17/2018 1138   K 2.7 (LL) 08/05/2017 1057   CL 101 03/17/2018 1138   CO2 24 03/17/2018 1138   CO2 33 (H) 08/05/2017 1057   GLUCOSE 92 03/17/2018 1138   GLUCOSE 84 11/15/2017 0815   GLUCOSE 103 08/05/2017 1057   BUN 12 03/17/2018 1138   BUN 31.2 (H) 08/05/2017 1057   CREATININE 1.20 (H) 03/17/2018 1138   CREATININE 1.8 (H) 08/05/2017 1057   CALCIUM 9.7 03/17/2018 1138   CALCIUM 10.4 08/05/2017 1057   GFRNONAA 54 (L) 03/17/2018 1138   GFRNONAA 83 08/29/2015 1457   GFRAA 62 03/17/2018 1138   GFRAA >89 08/29/2015 1457   Lab Results  Component Value Date   HGBA1C 5.0 03/17/2018   Lab Results  Component Value  Date   INSULIN 13.5 03/17/2018   CBC    Component Value Date/Time   WBC 12.3 (H) 10/06/2017 0934   RBC 3.88 10/06/2017 0934   HGB 12.4 10/06/2017 0934   HGB 12.8 08/05/2017 1057   HCT 37.7 10/06/2017 0934   HCT 39.2 08/05/2017 1057   PLT 360 10/06/2017 0934   PLT 354 08/05/2017 1057   MCV 97.2 10/06/2017 0934   MCV 99.7 08/05/2017 1057   MCH 32.0 10/06/2017 0934   MCHC 32.9 10/06/2017 0934   RDW 15.3 10/06/2017 0934   RDW 15.0 (H) 08/05/2017 1057   LYMPHSABS 4.3 (H) 10/06/2017 0934   LYMPHSABS 3.8 (H) 08/05/2017 1057   MONOABS 1.0 10/06/2017 0934   MONOABS 0.7 08/05/2017 1057   EOSABS 0.2 10/06/2017 0934   EOSABS 0.2 08/05/2017 1057   BASOSABS 0.0 10/06/2017 0934   BASOSABS 0.0 08/05/2017 1057   Iron/TIBC/Ferritin/ %Sat No results found for: IRON, TIBC, FERRITIN, IRONPCTSAT Lipid Panel     Component Value Date/Time   CHOL 192 03/17/2018 1138   TRIG 75 03/17/2018 1138   HDL 47 03/17/2018 1138   CHOLHDL 4 09/23/2017 0924   VLDL 14.0 09/23/2017 0924   LDLCALC 130 (H) 03/17/2018 1138   Hepatic Function Panel     Component Value Date/Time   PROT 7.3 03/17/2018 1138   PROT 8.3 08/05/2017 1057   ALBUMIN 3.9 03/17/2018 1138   ALBUMIN 3.2 (L) 08/05/2017 1057   AST 14 03/17/2018 1138   AST 18 08/05/2017 1057   ALT 6 03/17/2018 1138   ALT 10 08/05/2017 1057   ALKPHOS 71 03/17/2018 1138   ALKPHOS 62 08/05/2017 1057   BILITOT 0.4 03/17/2018 1138   BILITOT 0.46 08/05/2017 1057      Component Value Date/Time   TSH 0.628 03/17/2018 1138   TSH 0.825 11/17/2017 1018   TSH 0.975 08/29/2015 1457    ECG  shows NSR with a rate of 65 BPM INDIRECT CALORIMETER done today shows a VO2 of 263 and a REE of 1834.  Her calculated basal metabolic rate is 6440 thus her basal metabolic rate is worse than expected.    ASSESSMENT AND PLAN: Other fatigue - Plan: EKG 12-Lead, Hemoglobin A1c, Insulin, random, Lipid Panel With LDL/HDL Ratio, Vitamin B12, Folate, T3, T4, free,  TSH  Shortness of breath  on exertion  Vitamin D deficiency - Plan: VITAMIN D 25 Hydroxy (Vit-D Deficiency, Fractures)  Elevated serum creatinine - Plan: Comprehensive metabolic panel  Depression screening  At risk for osteoporosis  Class 3 severe obesity with serious comorbidity and body mass index (BMI) of 40.0 to 44.9 in adult, unspecified obesity type ([place])  PLAN: Fatigue Alexandra Powers was informed that her fatigue may be related to obesity, depression or many other causes. Labs will be ordered, and in the meanwhile Maygen has agreed to work on diet, exercise and weight loss to help with fatigue. Proper sleep hygiene was discussed including the need for 7-8 hours of quality sleep each night. A sleep study was not ordered based on symptoms and Epworth score. We will order indirect calorimetry.  Dyspnea on exertion Alexandra Powers's shortness of breath appears to be obesity related and exercise induced. She has agreed to work on weight loss and gradually increase exercise to treat her exercise induced shortness of breath. If Alexandra Powers follows our instructions and loses weight without improvement of her shortness of breath, we will plan to refer to pulmonology. We will order labs and indirect calorimetry. We will monitor this condition regularly. Alexandra Powers agrees to this plan.  Vitamin D Deficiency Alexandra Powers was informed that low vitamin D levels contributes to fatigue and are associated with obesity, breast, and colon cancer. She will follow up for routine testing of vitamin D, at least 2-3 times per year. We will check vitamin D level and Alexandra Powers agrees to follow up as directed.  At risk for osteopenia and osteoporosis Alexandra Powers is at risk for osteopenia and osteoporosis due to her vitamin D deficiency. She was encouraged to take her vitamin D and follow her higher calcium diet and increase strengthening exercise to help strengthen her bones and decrease her risk of osteopenia and osteoporosis.  Elevated Creatinine We will check CMP  today and Alexandra Powers agrees to follow up with our clinic in 2 weeks.  Depression Screen Alexandra Powers had a negative depression screening. Depression is commonly associated with obesity and often results in emotional eating behaviors. We will monitor this closely and work on CBT to help improve the non-hunger eating patterns. Referral to Psychology may be required if no improvement is seen as she continues in our clinic.  Obesity Alexandra Powers is currently in the action stage of change and her goal is to continue with weight loss efforts. I recommend Alexandra Powers Powers the structured treatment plan as follows:  She has agreed to follow the Category 2 plan +100 calories [place]nee has been instructed to eventually work up to a goal of 150 minutes of combined cardio and strengthening exercise per week for weight loss and overall health benefits. We discussed the following Behavioral Modification Strategies today: planning for success, better snacking choices, increasing lean protein intake, decreasing simple carbohydrates  and work on meal planning and easy cooking plans   She was informed of the importance of frequent follow up visits to maximize her success with intensive lifestyle modifications for her multiple health conditions. She was informed we would discuss her lab results at her next visit unless there is a critical issue that needs to be addressed sooner. Cherye agreed to keep her next visit at the agreed upon time to discuss these results.    OBESITY BEHAVIORAL INTERVENTION VISIT  Today's visit was # 1 out of 22.  Starting weight: 280 lbs Starting date: 03/17/18 Today's weight : 280 lbs Today's date: 03/17/2018 Total lbs lost to date: 0 (Patients must lose 7  lbs in the first 6 months to continue with counseling)   ASK: We discussed the diagnosis of obesity with Cristopher Estimable today and Vasilisa agreed to give [place] permission to discuss obesity behavioral modification therapy today.  ASSESS: Deshea has the diagnosis of obesity and her  BMI today is 41.33 Brihanna is in the action stage of change   ADVISE: Deb was educated on the multiple health risks of obesity as well as the benefit of weight loss to improve her health. She was advised of the need for long term treatment and the importance of lifestyle modifications.  AGREE: Multiple dietary modification options and treatment options were discussed and  Jasey agreed to the above obesity treatment plan.   I, Doreene Nest, am acting as transcriptionist for  Eber Jones, MD   I have reviewed the above documentation for accuracy and completeness, and I agree with the above. - Ilene Qua, MD

## 2018-03-29 DIAGNOSIS — M25561 Pain in right knee: Secondary | ICD-10-CM | POA: Diagnosis not present

## 2018-03-29 DIAGNOSIS — Z716 Tobacco abuse counseling: Secondary | ICD-10-CM | POA: Diagnosis not present

## 2018-03-29 DIAGNOSIS — F17210 Nicotine dependence, cigarettes, uncomplicated: Secondary | ICD-10-CM | POA: Diagnosis not present

## 2018-03-29 DIAGNOSIS — M25562 Pain in left knee: Secondary | ICD-10-CM | POA: Diagnosis not present

## 2018-03-30 DIAGNOSIS — M47817 Spondylosis without myelopathy or radiculopathy, lumbosacral region: Secondary | ICD-10-CM | POA: Diagnosis not present

## 2018-03-31 ENCOUNTER — Ambulatory Visit (INDEPENDENT_AMBULATORY_CARE_PROVIDER_SITE_OTHER): Payer: BLUE CROSS/BLUE SHIELD | Admitting: Family Medicine

## 2018-03-31 VITALS — BP 95/68 | HR 72 | Temp 98.0°F | Ht 69.0 in | Wt 279.0 lb

## 2018-03-31 DIAGNOSIS — Z6841 Body Mass Index (BMI) 40.0 and over, adult: Secondary | ICD-10-CM | POA: Diagnosis not present

## 2018-03-31 DIAGNOSIS — E7849 Other hyperlipidemia: Secondary | ICD-10-CM | POA: Diagnosis not present

## 2018-03-31 DIAGNOSIS — Z9189 Other specified personal risk factors, not elsewhere classified: Secondary | ICD-10-CM

## 2018-03-31 DIAGNOSIS — E559 Vitamin D deficiency, unspecified: Secondary | ICD-10-CM

## 2018-03-31 DIAGNOSIS — E8881 Metabolic syndrome: Secondary | ICD-10-CM

## 2018-03-31 DIAGNOSIS — I10 Essential (primary) hypertension: Secondary | ICD-10-CM

## 2018-03-31 MED ORDER — VITAMIN D (ERGOCALCIFEROL) 1.25 MG (50000 UNIT) PO CAPS: 50000.0000 [IU] | ORAL_CAPSULE | ORAL | 0 refills | Status: DC

## 2018-04-01 ENCOUNTER — Ambulatory Visit
Admission: RE | Admit: 2018-04-01 | Discharge: 2018-04-01 | Disposition: A | Discharge status: Home / Self Care | Payer: BLUE CROSS/BLUE SHIELD | Source: Ambulatory Visit | Attending: Internal Medicine | Admitting: Internal Medicine

## 2018-04-01 DIAGNOSIS — R922 Inconclusive mammogram: Secondary | ICD-10-CM | POA: Diagnosis not present

## 2018-04-01 DIAGNOSIS — N611 Abscess of the breast and nipple: Secondary | ICD-10-CM

## 2018-04-05 NOTE — Progress Notes (Signed)
Office: [phone]  /  Fax: [phone]   HPI:   Chief Complaint: OBESITY Alexandra Powers is here to discuss her progress with her obesity treatment plan. She is on the Category 2 plan +100 calories and is following her eating plan approximately 60 to 65 % of the time. She states she is doing water aerobics for 90 minutes 6 times per week. Alexandra Powers liked the meal plan for the most part. She doesn't like eggs, so she had to eat yogurt most days. She has occasional hunger. Alexandra Powers ran out of food a few days ago and couldn't follow the plan. Her weight is 280 lb (127 kg) today and has had a weight loss of 1 pound over a period of 2 weeks since her last visit. She has lost 1 lb since starting treatment with [place].  Vitamin D deficiency Alexandra Powers has a diagnosis of vitamin D deficiency. She is not currently taking vit D and her vitamin D level is at 19.2. Alexandra Powers denies nausea, vomiting or muscle weakness.  At risk for osteopenia and osteoporosis Alexandra Powers is at higher risk of osteopenia and osteoporosis due to vitamin D deficiency.   Hypertension Alexandra Powers is a 47 y.o. female with hypertension. Her blood pressure is low today. Alexandra Powers denies chest pain, chest pressure, headache, dizziness or lightheadedness. She is currently on three medications. She is working weight loss to help control her blood pressure with the goal of decreasing her risk of heart attack and stroke. Alexandra Powers blood pressure is not currently controlled.  Hyperlipidemia Alexandra Powers has hyperlipidemia and is not on statin. Her LDL is at 130. She has been trying to improve her cholesterol levels with intensive lifestyle modification including a low saturated fat diet, exercise and weight loss. She denies any chest pain, claudication or myalgias.  Insulin Resistance Alexandra Powers has a diagnosis of insulin resistance based on her elevated fasting insulin level of 13.5. Although Alexandra Powers's blood glucose readings are still under good control, insulin resistance puts her at greater risk of  metabolic syndrome and diabetes. She is not taking metformin currently and continues to work on diet and exercise to decrease risk of diabetes.  ALLERGIES: No Known Allergies  MEDICATIONS: Current Outpatient Medications on File Prior to Visit  Medication Sig Dispense Refill  . amLODipine (NORVASC) 10 MG tablet Take 1 tablet (10 mg total) by mouth daily. 90 tablet 3  . hydrochlorothiazide (HYDRODIURIL) 25 MG tablet Take 25 mg by mouth daily.    Marland Kitchen lisinopril (PRINIVIL,ZESTRIL) 40 MG tablet Take 1 tablet (40 mg total) by mouth daily. 90 tablet 3   No current facility-administered medications on file prior to visit.     PAST MEDICAL HISTORY: Past Medical History:  Diagnosis Date  . Arthritis   . Gallstones   . GERD (gastroesophageal reflux disease)   . Hypertension   . Obesity   . Osteoarthritis   . PONV (postoperative nausea and vomiting)   . Sleep apnea    SENT CPAP BACK, NOT WORN IN 7-8 MONTHS    PAST SURGICAL HISTORY: Past Surgical History:  Procedure Laterality Date  . BREAST DUCTAL SYSTEM EXCISION Bilateral 10/12/2017   Procedure: EXCISION OF BILATERAL CENTRAL DUCT;  Surgeon: Erroll Luna, MD;  Location: [place];  Service: General;  Laterality: Bilateral;  . BREAST EXCISIONAL BIOPSY    . CHOLECYSTECTOMY N/A 12/21/2015   Procedure: LAPAROSCOPIC CHOLECYSTECTOMY;  Surgeon: Georganna Skeans, MD;  Location: [facility];  Service: General;  Laterality: N/A;  . UMBILICAL HERNIA REPAIR N/A 12/27/2015  Procedure: INCARCERATED UMBILICAL HERNIA REPAIR;  Surgeon: Coralie Keens, MD;  Location: [facility];  Service: General;  Laterality: N/A;    SOCIAL HISTORY: Social History   Tobacco Use  . Smoking status: Current Every Day Smoker    Packs/day: 0.50    Years: 24.00    Pack years: 12.00    Types: Cigarettes  . Smokeless tobacco: Never Used  Substance Use Topics  . Alcohol use: Yes    Alcohol/week: 2.4 oz    Types: 4 Shots of liquor per week    Comment: per week  . Drug use: Yes     Types: Marijuana    FAMILY HISTORY: Family History  Problem Relation Age of Onset  . Arthritis Mother   . High blood pressure Mother   . Sleep apnea Mother   . High blood pressure Father   . Breast cancer Paternal Aunt   . Adrenal disorder Neg Hx   . Colon cancer Neg Hx   . Esophageal cancer Neg Hx   . Pancreatic cancer Neg Hx   . Stomach cancer Neg Hx   . Liver disease Neg Hx     ROS: Review of Systems  Constitutional: Positive for weight loss.  Cardiovascular: Negative for chest pain and claudication.  Gastrointestinal: Negative for nausea and vomiting.  Musculoskeletal: Negative for myalgias.       Negative for muscle weakness  Neurological: Negative for dizziness.       Negative for lightheadedness    PHYSICAL EXAM: Blood pressure 95/68, pulse 72, temperature 98 F (36.7 C), temperature source Oral, height 5\' 9"  (1.753 m), weight 280 lb (127 kg), last menstrual period 03/04/2018, SpO2 99 %. Body mass index is 41.35 kg/m. Physical Exam  Constitutional: She is oriented to person, place, and time. She appears well-developed and well-nourished.  Cardiovascular: Normal rate.  Pulmonary/Chest: Effort normal.  Musculoskeletal: Normal range of motion.  Neurological: She is oriented to person, place, and time.  Skin: Skin is warm and dry.  Psychiatric: She has a normal mood and affect. Her behavior is normal.  Vitals reviewed.   RECENT LABS AND TESTS: BMET    Component Value Date/Time   NA 138 03/17/2018 1138   NA 142 08/05/2017 1057   K 3.6 03/17/2018 1138   K 2.7 (LL) 08/05/2017 1057   CL 101 03/17/2018 1138   CO2 24 03/17/2018 1138   CO2 33 (H) 08/05/2017 1057   GLUCOSE 92 03/17/2018 1138   GLUCOSE 84 11/15/2017 0815   GLUCOSE 103 08/05/2017 1057   BUN 12 03/17/2018 1138   BUN 31.2 (H) 08/05/2017 1057   CREATININE 1.20 (H) 03/17/2018 1138   CREATININE 1.8 (H) 08/05/2017 1057   CALCIUM 9.7 03/17/2018 1138   CALCIUM 10.4 08/05/2017 1057   GFRNONAA 54 (L)  03/17/2018 1138   GFRNONAA 83 08/29/2015 1457   GFRAA 62 03/17/2018 1138   GFRAA >89 08/29/2015 1457   Lab Results  Component Value Date   HGBA1C 5.0 03/17/2018   HGBA1C 5.5 09/23/2017   Lab Results  Component Value Date   INSULIN 13.5 03/17/2018   CBC    Component Value Date/Time   WBC 12.3 (H) 10/06/2017 0934   RBC 3.88 10/06/2017 0934   HGB 12.4 10/06/2017 0934   HGB 12.8 08/05/2017 1057   HCT 37.7 10/06/2017 0934   HCT 39.2 08/05/2017 1057   PLT 360 10/06/2017 0934   PLT 354 08/05/2017 1057   MCV 97.2 10/06/2017 0934   MCV 99.7 08/05/2017 1057  MCH 32.0 10/06/2017 0934   MCHC 32.9 10/06/2017 0934   RDW 15.3 10/06/2017 0934   RDW 15.0 (H) 08/05/2017 1057   LYMPHSABS 4.3 (H) 10/06/2017 0934   LYMPHSABS 3.8 (H) 08/05/2017 1057   MONOABS 1.0 10/06/2017 0934   MONOABS 0.7 08/05/2017 1057   EOSABS 0.2 10/06/2017 0934   EOSABS 0.2 08/05/2017 1057   BASOSABS 0.0 10/06/2017 0934   BASOSABS 0.0 08/05/2017 1057   Iron/TIBC/Ferritin/ %Sat No results found for: IRON, TIBC, FERRITIN, IRONPCTSAT Lipid Panel     Component Value Date/Time   CHOL 192 03/17/2018 1138   TRIG 75 03/17/2018 1138   HDL 47 03/17/2018 1138   CHOLHDL 4 09/23/2017 0924   VLDL 14.0 09/23/2017 0924   LDLCALC 130 (H) 03/17/2018 1138   Hepatic Function Panel     Component Value Date/Time   PROT 7.3 03/17/2018 1138   PROT 8.3 08/05/2017 1057   ALBUMIN 3.9 03/17/2018 1138   ALBUMIN 3.2 (L) 08/05/2017 1057   AST 14 03/17/2018 1138   AST 18 08/05/2017 1057   ALT 6 03/17/2018 1138   ALT 10 08/05/2017 1057   ALKPHOS 71 03/17/2018 1138   ALKPHOS 62 08/05/2017 1057   BILITOT 0.4 03/17/2018 1138   BILITOT 0.46 08/05/2017 1057      Component Value Date/Time   TSH 0.628 03/17/2018 1138   TSH 0.825 11/17/2017 1018   TSH 0.975 08/29/2015 1457   Results for JEANY, SEVILLE (MRN [id number]) as of 04/05/2018 10:16  Ref. Range 03/17/2018 11:38  Vitamin D, 25-Hydroxy Latest Ref Range: 30.0 - 100.0 ng/mL 19.2  (L)   ASSESSMENT AND PLAN: Essential hypertension  Vitamin D deficiency - Plan: Vitamin D, Ergocalciferol, (DRISDOL) 50000 units CAPS capsule  Other hyperlipidemia  Insulin resistance  At risk for osteoporosis  Class 3 severe obesity with serious comorbidity and body mass index (BMI) of 40.0 to 44.9 in adult, unspecified obesity type ([place])  PLAN:  Vitamin D Deficiency Alexandra Powers was informed that low vitamin D levels contributes to fatigue and are associated with obesity, breast, and colon cancer. She agrees to start to take prescription Vit D @50 ,000 IU every week #4 with no refills and we will recheck labs in 3 months. Alexandra Powers will follow up for routine testing of vitamin D, at least 2-3 times per year. She was informed of the risk of over-replacement of vitamin D and agrees to not increase her dose unless she discusses this with [place] first. Alexandra Powers agrees to follow up with our clinic in 2 weeks.  At risk for osteopenia and osteoporosis Alexandra Powers is at risk for osteopenia and osteoporosis due to her vitamin D deficiency. She was encouraged to take her vitamin D and follow her higher calcium diet and increase strengthening exercise to help strengthen her bones and decrease her risk of osteopenia and osteoporosis.  Hypertension We discussed sodium restriction, working on healthy weight loss, and a regular exercise program as the means to achieve improved blood pressure control. Alexandra Powers agreed with this plan and agreed to follow up as directed. We will continue to monitor her blood pressure as well as her progress with the above lifestyle modifications. She will continue her medications as prescribed and will watch for signs of hypotension as she continues her lifestyle modifications. Alexandra Powers is to message [place] if dizzy or lightheaded.  Hyperlipidemia Alexandra Powers was informed of the American Heart Association Guidelines emphasizing intensive lifestyle modifications as the first line treatment for hyperlipidemia. We discussed many  lifestyle modifications today in depth, and Alexandra Powers  will continue to work on decreasing saturated fats such as fatty red meat, butter and many fried foods. She will also increase vegetables and lean protein in her diet and continue to work on exercise and weight loss efforts. We will repeat fasting lipid panel in 3 months.  Insulin Resistance Alexandra Powers will continue to work on weight loss, exercise, and decreasing simple carbohydrates in her diet to help decrease the risk of diabetes. She was informed that eating too many simple carbohydrates or too many calories at one sitting increases the likelihood of GI side effects. We will repeat Hgb A1c and insulin in 3 months and Alexandra Powers agreed to follow up with [place] as directed to monitor her progress.  Obesity Alexandra Powers is currently in the action stage of change. As such, her goal is to continue with weight loss efforts She has agreed to follow the Category 2 plan Alexandra Powers has been instructed to work up to a goal of 150 minutes of combined cardio and strengthening exercise per week for weight loss and overall health benefits. We discussed the following Behavioral Modification Strategies today: increase H2O intake, planning for success, increasing lean protein intake, increasing vegetables and work on meal planning and easy cooking plans  Alexandra Powers has agreed to follow up with our clinic in 2 weeks. She was informed of the importance of frequent follow up visits to maximize her success with intensive lifestyle modifications for her multiple health conditions.   OBESITY BEHAVIORAL INTERVENTION VISIT  Today's visit was # 2 out of 22.  Starting weight: 280 lbs Starting date: 03/17/18 Today's weight : 279 lbs Today's date: 03/31/2018 Total lbs lost to date: 1 (Patients must lose 7 lbs in the first 6 months to continue with counseling)   ASK: We discussed the diagnosis of obesity with Cristopher Estimable today and [place] agreed to give [place] permission to discuss obesity behavioral modification  therapy today.  ASSESS: Amit has the diagnosis of obesity and her BMI today is 41.3 Tiesha is in the action stage of change   ADVISE: Jacquelyne was educated on the multiple health risks of obesity as well as the benefit of weight loss to improve her health. She was advised of the need for long term treatment and the importance of lifestyle modifications.  AGREE: Multiple dietary modification options and treatment options were discussed and  Britiany agreed to the above obesity treatment plan.  I, Doreene Nest, am acting as transcriptionist for Eber Jones, MD  I have reviewed the above documentation for accuracy and completeness, and I agree with the above. - Ilene Qua, MD

## 2018-04-07 NOTE — Telephone Encounter (Signed)
Can you close this please, Thank you.  °

## 2018-04-13 DIAGNOSIS — M25562 Pain in left knee: Secondary | ICD-10-CM | POA: Diagnosis not present

## 2018-04-18 ENCOUNTER — Encounter (INDEPENDENT_AMBULATORY_CARE_PROVIDER_SITE_OTHER): Payer: Self-pay

## 2018-04-18 ENCOUNTER — Ambulatory Visit (INDEPENDENT_AMBULATORY_CARE_PROVIDER_SITE_OTHER): Payer: BLUE CROSS/BLUE SHIELD | Admitting: Family Medicine

## 2018-04-18 DIAGNOSIS — M25562 Pain in left knee: Secondary | ICD-10-CM | POA: Diagnosis not present

## 2018-05-02 ENCOUNTER — Ambulatory Visit (INDEPENDENT_AMBULATORY_CARE_PROVIDER_SITE_OTHER): Payer: BLUE CROSS/BLUE SHIELD | Admitting: Internal Medicine

## 2018-05-02 ENCOUNTER — Encounter: Payer: Self-pay | Admitting: Internal Medicine

## 2018-05-02 VITALS — BP 114/72 | HR 84 | Temp 98.2°F | Resp 16 | Wt 281.0 lb

## 2018-05-02 DIAGNOSIS — J039 Acute tonsillitis, unspecified: Secondary | ICD-10-CM | POA: Diagnosis not present

## 2018-05-02 DIAGNOSIS — J029 Acute pharyngitis, unspecified: Secondary | ICD-10-CM | POA: Diagnosis not present

## 2018-05-02 LAB — POCT RAPID STREP A (OFFICE): Rapid Strep A Screen: NEGATIVE

## 2018-05-02 MED ORDER — AMOXICILLIN-POT CLAVULANATE 875-125 MG PO TABS: 1.0000 | ORAL_TABLET | Freq: Two times a day (BID) | ORAL | 0 refills | Status: DC

## 2018-05-02 NOTE — Assessment & Plan Note (Signed)
Exam consistent with tonsillitis Strep negative, but given appearance of tonsils on exam I will treat Start augmentin advil alternating with tylenol otc cold medications Rest, fluid Call if no improvement

## 2018-05-02 NOTE — Patient Instructions (Signed)
Take the antibiotic as prescribed - complete the entire course.   Take advil with food three times a day for the pain and swelling.   Lots of fluids and rest.    Call if no improvement     Tonsillitis Tonsillitis is an infection of the throat that causes the tonsils to become red, tender, and swollen. Tonsils are collections of lymphoid tissue at the back of the throat. Each tonsil has crevices (crypts). Tonsils help fight nose and throat infections and keep infection from spreading to other parts of the body for the first 18 months of life. What are the causes? Sudden (acute) tonsillitis is usually caused by infection with streptococcal bacteria. Long-lasting (chronic) tonsillitis occurs when the crypts of the tonsils become filled with pieces of food and bacteria, which makes it easy for the tonsils to become repeatedly infected. What are the signs or symptoms? Symptoms of tonsillitis include:  A sore throat, with possible difficulty swallowing.  White patches on the tonsils.  Fever.  Tiredness.  New episodes of snoring during sleep, when you did not snore before.  Small, foul-smelling, yellowish-white pieces of material (tonsilloliths) that you occasionally cough up or spit out. The tonsilloliths can also cause you to have bad breath.  How is this diagnosed? Tonsillitis can be diagnosed through a physical exam. Diagnosis can be confirmed with the results of lab tests, including a throat culture. How is this treated? The goals of tonsillitis treatment include the reduction of the severity and duration of symptoms and prevention of associated conditions. Symptoms of tonsillitis can be improved with the use of steroids to reduce the swelling. Tonsillitis caused by bacteria can be treated with antibiotic medicines. Usually, treatment with antibiotic medicines is started before the cause of the tonsillitis is known. However, if it is determined that the cause is not bacterial,  antibiotic medicines will not treat the tonsillitis. If attacks of tonsillitis are severe and frequent, your health care provider may recommend surgery to remove the tonsils (tonsillectomy). Follow these instructions at home:  Rest as much as possible and get plenty of sleep.  Drink plenty of fluids. While the throat is very sore, eat soft foods or liquids, such as sherbet, soups, or instant breakfast drinks.  Eat frozen ice pops.  Gargle with a warm or cold liquid to help soothe the throat. Mix 1/4 teaspoon of salt and 1/4 teaspoon of baking soda in 8 oz of water. Contact a health care provider if:  Large, tender lumps develop in your neck.  A rash develops.  A green, yellow-brown, or bloody substance is coughed up.  You are unable to swallow liquids or food for 24 hours.  You notice that only one of the tonsils is swollen. Get help right away if:  You develop any new symptoms such as vomiting, severe headache, stiff neck, chest pain, or trouble breathing or swallowing.  You have severe throat pain along with drooling or voice changes.  You have severe pain, unrelieved with recommended medications.  You are unable to fully open the mouth.  You develop redness, swelling, or severe pain anywhere in the neck.  You have a fever. This information is not intended to replace advice given to you by your health care provider. Make sure you discuss any questions you have with your health care provider. Document Released: 08/26/2005 Document Revised: 04/23/2016 Document Reviewed: 05/05/2013 Elsevier Interactive Patient Education  2017 [employer].

## 2018-05-02 NOTE — Progress Notes (Signed)
Subjective:    [name], female    DOB: [birth year], 47 y.o.   MRN: [id number]  HPI She is here for an acute visit for cold symptoms.  Her symptoms started three days ago.  She is experiencing sore throat.  She is also coughing.  She also has a runny nose, trouble swallowing and some diarrhea.  She denies fever, congestion, sinus pain, ear pain, SOB, wheeze and headaches.   She has taken halls cough drops, theraflu.    Medications and allergies reviewed with patient and updated if appropriate.  Patient Active Problem List   Diagnosis Date Noted  . Hypokalemia 09/03/2017  . Acute prerenal failure ([place]) 09/03/2017  . OSA (obstructive sleep apnea) 12/10/2016  . GERD (gastroesophageal reflux disease) 12/04/2016  . Hypersomnia 10/29/2016  . Back pain 08/21/2016  . Chronic pain of multiple joints 08/13/2016  . MGUS (monoclonal gammopathy of unknown significance) 07/02/2016  . Vitamin D deficiency 07/02/2016  . Diarrhea 07/02/2016  . Joint pain 04/03/2016  . HTN (hypertension), benign 11/11/2015  . Morbid obesity ([place]) 11/11/2015  . Adrenal mass ([place]) 08/30/2015  . Ovarian cyst 08/30/2015    Current Outpatient Medications on File Prior to Visit  Medication Sig Dispense Refill  . amLODipine (NORVASC) 10 MG tablet Take 1 tablet (10 mg total) by mouth daily. 90 tablet 3  . hydrochlorothiazide (HYDRODIURIL) 25 MG tablet Take 25 mg by mouth daily.    Marland Kitchen lisinopril (PRINIVIL,ZESTRIL) 40 MG tablet Take 1 tablet (40 mg total) by mouth daily. 90 tablet 3  . Vitamin D, Ergocalciferol, (DRISDOL) 50000 units CAPS capsule Take 1 capsule (50,000 Units total) by mouth every 7 (seven) days. 4 capsule 0   No current facility-administered medications on file prior to visit.     Past Medical History:  Diagnosis Date  . Arthritis   . Gallstones   . GERD (gastroesophageal reflux disease)   . Hypertension   . Obesity   . Osteoarthritis   . PONV (postoperative nausea and  vomiting)   . Sleep apnea    SENT CPAP BACK, NOT WORN IN 7-8 MONTHS    Past Surgical History:  Procedure Laterality Date  . BREAST DUCTAL SYSTEM EXCISION Bilateral 10/12/2017   Procedure: EXCISION OF BILATERAL CENTRAL DUCT;  Surgeon: Erroll Luna, MD;  Location: [facility];  Service: General;  Laterality: Bilateral;  . BREAST EXCISIONAL BIOPSY    . CHOLECYSTECTOMY N/A 12/21/2015   Procedure: LAPAROSCOPIC CHOLECYSTECTOMY;  Surgeon: Georganna Skeans, MD;  Location: [facility];  Service: General;  Laterality: N/A;  . UMBILICAL HERNIA REPAIR N/A 12/27/2015   Procedure: INCARCERATED UMBILICAL HERNIA REPAIR;  Surgeon: Coralie Keens, MD;  Location: [facility];  Service: General;  Laterality: N/A;    Social History   Socioeconomic History  . Marital status: Married    Spouse name: Lamisha Roussell  . Number of children: 2  . Years of education: Not on file  . Highest education level: Not on file  Occupational History  . Occupation: Stay at [employer]: unemploed  Social Needs  . Financial resource strain: Not on file  . Food insecurity:    Worry: Not on file    Inability: Not on file  . Transportation needs:    Medical: Not on file    Non-medical: Not on file  Tobacco Use  . Smoking status: Current Every Day Smoker    Packs/day: 0.50    Years: 24.00    Pack years: 12.00  Types: Cigarettes  . Smokeless tobacco: Never Used  Substance and Sexual Activity  . Alcohol use: Yes    Alcohol/week: 2.4 oz    Types: 4 Shots of liquor per week    Comment: per week  . Drug use: Yes    Types: Marijuana  . Sexual activity: Yes    Partners: Male    Birth control/protection: None  Lifestyle  . Physical activity:    Days per week: Not on file    Minutes per session: Not on file  . Stress: Not on file  Relationships  . Social connections:    Talks on phone: Not on file    Gets together: Not on file    Attends religious service: Not on file    Active member of club or organization: Not on  file    Attends meetings of clubs or organizations: Not on file    Relationship status: Not on file  Other Topics Concern  . Not on file  Social History Narrative   Lives with husband, Berneta Sages.    Family History  Problem Relation Age of Onset  . Arthritis Mother   . High blood pressure Mother   . Sleep apnea Mother   . High blood pressure Father   . Breast cancer Paternal Aunt   . Adrenal disorder Neg Hx   . Colon cancer Neg Hx   . Esophageal cancer Neg Hx   . Pancreatic cancer Neg Hx   . Stomach cancer Neg Hx   . Liver disease Neg Hx     Review of Systems  Constitutional: Negative for chills and fever.  HENT: Positive for rhinorrhea, sore throat and trouble swallowing. Negative for congestion, ear pain, sinus pressure and sinus pain.   Respiratory: Positive for cough (occ productive). Negative for shortness of breath and wheezing.   Gastrointestinal: Positive for diarrhea. Negative for nausea.  Musculoskeletal: Negative for myalgias.  Neurological: Negative for light-headedness and headaches.       Objective:   Vitals:   05/02/18 0845  BP: 114/72  Pulse: 84  Resp: 16  Temp: 98.2 F (36.8 C)  SpO2: 98%   Filed Weights   05/02/18 0845  Weight: 281 lb (127.5 kg)   Body mass index is 41.5 kg/m.  Wt Readings from Last 3 Encounters:  05/02/18 281 lb (127.5 kg)  03/31/18 279 lb (126.6 kg)  03/17/18 280 lb (127 kg)     Physical Exam GENERAL APPEARANCE: Appears stated age, well appearing, NAD EYES: conjunctiva clear, no icterus HEENT: bilateral tympanic membranes and ear canals normal, oropharynx with moderate erythema with large tonsils with exudate, no thyromegaly, trachea midline, tender cervical or supraclavicular lymphadenopathy LUNGS: Clear to auscultation without wheeze or crackles, unlabored breathing, good air entry bilaterally CARDIOVASCULAR: Normal S1,S2 without murmurs, no edema SKIN: warm, dry        Assessment & Plan:   See Problem List for  Assessment and Plan of chronic medical problems.

## 2018-06-24 ENCOUNTER — Other Ambulatory Visit (INDEPENDENT_AMBULATORY_CARE_PROVIDER_SITE_OTHER): Payer: BLUE CROSS/BLUE SHIELD

## 2018-06-24 ENCOUNTER — Ambulatory Visit (INDEPENDENT_AMBULATORY_CARE_PROVIDER_SITE_OTHER)
Admission: RE | Admit: 2018-06-24 | Discharge: 2018-06-24 | Disposition: A | Discharge status: Home / Self Care | Payer: BLUE CROSS/BLUE SHIELD | Source: Ambulatory Visit | Attending: Family | Admitting: Family

## 2018-06-24 ENCOUNTER — Ambulatory Visit (INDEPENDENT_AMBULATORY_CARE_PROVIDER_SITE_OTHER): Payer: BLUE CROSS/BLUE SHIELD | Admitting: Family

## 2018-06-24 ENCOUNTER — Encounter: Payer: Self-pay | Admitting: Family

## 2018-06-24 VITALS — BP 128/86 | HR 64 | Temp 97.7°F | Ht 69.0 in | Wt 288.0 lb

## 2018-06-24 DIAGNOSIS — M79642 Pain in left hand: Secondary | ICD-10-CM | POA: Diagnosis not present

## 2018-06-24 DIAGNOSIS — M79641 Pain in right hand: Secondary | ICD-10-CM

## 2018-06-24 DIAGNOSIS — M791 Myalgia, unspecified site: Secondary | ICD-10-CM

## 2018-06-24 DIAGNOSIS — I10 Essential (primary) hypertension: Secondary | ICD-10-CM | POA: Diagnosis not present

## 2018-06-24 LAB — COMPREHENSIVE METABOLIC PANEL
ALT: 6 U/L (ref 0–35)
AST: 14 U/L (ref 0–37)
Albumin: 3.8 g/dL (ref 3.5–5.2)
Alkaline Phosphatase: 55 U/L (ref 39–117)
BUN: 15 mg/dL (ref 6–23)
CHLORIDE: 105 meq/L (ref 96–112)
CO2: 29 mEq/L (ref 19–32)
Calcium: 9.1 mg/dL (ref 8.4–10.5)
Creatinine, Ser: 1.09 mg/dL (ref 0.40–1.20)
GFR: 68.88 mL/min (ref >60.00)
Glucose, Bld: 79 mg/dL (ref 70–99)
Potassium: 3.7 mEq/L (ref 3.5–5.1)
Sodium: 140 mEq/L (ref 135–145)
Total Bilirubin: 0.4 mg/dL (ref 0.2–1.2)
Total Protein: 7.8 g/dL (ref 6.0–8.3)

## 2018-06-24 LAB — CBC WITH DIFFERENTIAL/PLATELET
Basophils Absolute: 0.1 10*3/uL (ref 0.0–0.1)
Basophils Relative: 1.2 % (ref 0.0–3.0)
Eosinophils Absolute: 0.2 10*3/uL (ref 0.0–0.7)
Eosinophils Relative: 2.8 % (ref 0.0–5.0)
HCT: 38 % (ref 36.0–46.0)
Hemoglobin: 12.9 g/dL (ref 12.0–15.0)
Lymphocytes Relative: 37.1 % (ref 12.0–46.0)
Lymphs Abs: 3.2 10*3/uL (ref 0.7–4.0)
MCHC: 34 g/dL (ref 30.0–36.0)
MCV: 98.9 fl (ref 78.0–100.0)
Monocytes Absolute: 0.6 10*3/uL (ref 0.1–1.0)
Monocytes Relative: 6.9 % (ref 3.0–12.0)
Neutro Abs: 4.5 10*3/uL (ref 1.4–7.7)
Neutrophils Relative %: 52 % (ref 43.0–77.0)
Platelets: 304 10*3/uL (ref 150.0–400.0)
RBC: 3.84 Mil/uL — AB (ref 3.87–5.11)
RDW: 14.8 % (ref 11.5–15.5)
WBC: 8.6 10*3/uL (ref 4.0–10.5)

## 2018-06-24 LAB — SEDIMENTATION RATE: SED RATE: 130 mm/h — AB (ref 0–20)

## 2018-06-24 LAB — URIC ACID: Uric Acid, Serum: 5.7 mg/dL (ref 2.4–7.0)

## 2018-06-24 MED ORDER — MELOXICAM 7.5 MG PO TABS: 7.5000 mg | ORAL_TABLET | Freq: Every day | ORAL | 0 refills | Status: DC

## 2018-06-24 NOTE — Progress Notes (Signed)
Alexandra Powers is a 47 y.o. female with the following history as recorded in EpicCare:  Patient Active Problem List   Diagnosis Date Noted  . Tonsillitis 05/02/2018  . Hypokalemia 09/03/2017  . Acute prerenal failure ([place]) 09/03/2017  . OSA (obstructive sleep apnea) 12/10/2016  . GERD (gastroesophageal reflux disease) 12/04/2016  . Hypersomnia 10/29/2016  . Back pain 08/21/2016  . Chronic pain of multiple joints 08/13/2016  . MGUS (monoclonal gammopathy of unknown significance) 07/02/2016  . Vitamin D deficiency 07/02/2016  . Diarrhea 07/02/2016  . Joint pain 04/03/2016  . HTN (hypertension), benign 11/11/2015  . Morbid obesity ([place]) 11/11/2015  . Adrenal mass ([place]) 08/30/2015  . Ovarian cyst 08/30/2015    Current Outpatient Medications  Medication Sig Dispense Refill  . amLODipine (NORVASC) 10 MG tablet Take 1 tablet (10 mg total) by mouth daily. 90 tablet 3  . hydrochlorothiazide (HYDRODIURIL) 25 MG tablet Take 25 mg by mouth daily.    Marland Kitchen lisinopril (PRINIVIL,ZESTRIL) 40 MG tablet Take 1 tablet (40 mg total) by mouth daily. 90 tablet 3  . potassium chloride SA (KLOR-CON M20) 20 MEQ tablet Klor-Con M20 mEq tablet,extended release    . Vitamin D, Ergocalciferol, (DRISDOL) 50000 units CAPS capsule Take 1 capsule (50,000 Units total) by mouth every 7 (seven) days. 4 capsule 0  . meloxicam (MOBIC) 7.5 MG tablet Take 1 tablet (7.5 mg total) by mouth daily. 15 tablet 0   No current facility-administered medications for this visit.     Allergies: Patient has no known allergies.  Past Medical History:  Diagnosis Date  . Arthritis   . Gallstones   . GERD (gastroesophageal reflux disease)   . Hypertension   . Obesity   . Osteoarthritis   . PONV (postoperative nausea and vomiting)   . Sleep apnea    SENT CPAP BACK, NOT WORN IN 7-8 MONTHS    Past Surgical History:  Procedure Laterality Date  . BREAST DUCTAL SYSTEM EXCISION Bilateral 10/12/2017   Procedure: EXCISION OF BILATERAL  CENTRAL DUCT;  Surgeon: Erroll Luna, MD;  Location: [place];  Service: General;  Laterality: Bilateral;  . BREAST EXCISIONAL BIOPSY    . CHOLECYSTECTOMY N/A 12/21/2015   Procedure: LAPAROSCOPIC CHOLECYSTECTOMY;  Surgeon: Georganna Skeans, MD;  Location: [place];  Service: General;  Laterality: N/A;  . UMBILICAL HERNIA REPAIR N/A 12/27/2015   Procedure: INCARCERATED UMBILICAL HERNIA REPAIR;  Surgeon: Coralie Keens, MD;  Location: [facility];  Service: General;  Laterality: N/A;    Family History  Problem Relation Age of Onset  . Arthritis Mother   . High blood pressure Mother   . Sleep apnea Mother   . High blood pressure Father   . Breast cancer Paternal Aunt   . Adrenal disorder Neg Hx   . Colon cancer Neg Hx   . Esophageal cancer Neg Hx   . Pancreatic cancer Neg Hx   . Stomach cancer Neg Hx   . Liver disease Neg Hx     Social History   Tobacco Use  . Smoking status: Current Every Day Smoker    Packs/day: 0.50    Years: 24.00    Pack years: 12.00    Types: Cigarettes  . Smokeless tobacco: Never Used  Substance Use Topics  . Alcohol use: Yes    Alcohol/week: 2.4 oz    Types: 4 Shots of liquor per week    Comment: per week    Subjective:  Patient presents with concerns for bilateral hand stiffness/ "hurts to even open a  water bottle." Symptoms x at least 1 month; Note that hands feel still upon waking up and symptoms take more than 30 minutes to resolve; Does experience some mild swelling; also experiencing pain in her feet as well; +FH- mother does have RA; no personal history of gout; has documented OA in back/ knees/ hips- under care of orthopedist; smokes- 1/2 ppd;   Objective:  Vitals:   06/24/18 0939  BP: 128/86  Pulse: 64  Temp: 97.7 F (36.5 C)  TempSrc: Oral  SpO2: 99%  Weight: 288 lb (130.6 kg)  Height: '5\' 9"'  (1.753 m)    General: Well developed, well nourished, in no acute distress  Skin : Warm and dry.  Head: Normocephalic and atraumatic  Lungs: Respirations  unlabored; clear to auscultation bilaterally without wheeze, rales, rhonchi  Musculoskeletal: No deformities; no active joint inflammation  Extremities: No edema, cyanosis, clubbing  Vessels: Symmetric bilaterally  Neurologic: Alert and oriented; speech intact; face symmetrical; moves all extremities well; CNII-XII intact without focal deficit   Assessment:  1. Myalgia   2. Essential hypertension   3. Bilateral hand pain     Plan:  Will update X-rays and labs today; may need to refer to rheumatology; needs to quit smoking; follow-up to be determined. Short-term trial of Mobic 7.5 mg qd- need to update creatinine today;   No follow-ups on file.  Orders Placed This Encounter  Procedures  . DG Hand Complete Left    Standing Status:   Future    Number of Occurrences:   1    Standing Expiration Date:   08/26/2019    Order Specific Question:   Reason for Exam (SYMPTOM  OR DIAGNOSIS REQUIRED)    Answer:   pain    Order Specific Question:   Is patient pregnant?    Answer:   No    Order Specific Question:   Preferred imaging location?    Answer:   [facility]    Order Specific Question:   Radiology Contrast Protocol - do NOT remove file path    Answer:   \\[email or web address]  . DG Hand Complete Right    Standing Status:   Future    Number of Occurrences:   1    Standing Expiration Date:   08/26/2019    Order Specific Question:   Reason for Exam (SYMPTOM  OR DIAGNOSIS REQUIRED)    Answer:   pain    Order Specific Question:   Is patient pregnant?    Answer:   No    Order Specific Question:   Preferred imaging location?    Answer:   [facility]    Order Specific Question:   Radiology Contrast Protocol - do NOT remove file path    Answer:   \\[email or web address].[email or web address]  . Antinuclear Antib (ANA)    Standing Status:   Future    Number of Occurrences:   1    Standing Expiration Date:   06/24/2019  . Rheumatoid  Factor    Standing Status:   Future    Number of Occurrences:   1    Standing Expiration Date:   06/24/2019  . Sed Rate (ESR)    Standing Status:   Future    Number of Occurrences:   1    Standing Expiration Date:   06/24/2019  . Uric acid    Standing Status:   Future    Number of Occurrences:   1    Standing Expiration Date:   06/24/2019  . CBC w/Diff  Standing Status:   Future    Number of Occurrences:   1    Standing Expiration Date:   06/24/2019  . Comp Met (CMET)    Standing Status:   Future    Number of Occurrences:   1    Standing Expiration Date:   06/24/2019    Requested Prescriptions   Signed Prescriptions Disp Refills  . meloxicam (MOBIC) 7.5 MG tablet 15 tablet 0    Sig: Take 1 tablet (7.5 mg total) by mouth daily.

## 2018-06-27 ENCOUNTER — Other Ambulatory Visit: Payer: Self-pay | Admitting: Family

## 2018-06-27 DIAGNOSIS — R7689 Other specified abnormal immunological findings in serum: Secondary | ICD-10-CM

## 2018-06-27 DIAGNOSIS — R768 Other specified abnormal immunological findings in serum: Secondary | ICD-10-CM

## 2018-06-28 LAB — RHEUMATOID FACTOR: RHEUMATOID FACTOR: 83 [IU]/mL — AB (ref <14)

## 2018-06-28 LAB — ANA: Anti Nuclear Antibody(ANA): NEGATIVE

## 2018-07-11 NOTE — Progress Notes (Signed)
Office Visit Note  Patient: Alexandra Powers             Date of Birth: 1971-11-25           MRN: [id number]             PCP: Binnie Rail, MD Referring: Marrian Salvage,* Visit Date: 07/25/2018 Occupation: unemployed  Subjective:  Pain in multiple joints comes now since going   History of Present Illness: Alexandra Powers is a 47 y.o. female seen in consultation per request of her PCP for evaluation of joint pain.  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  She has been getting injections to her back by Dr. Maryjean Ka.  She is also seeing orthopedics for ongoing hip joint joint discomfort.  She has had cortisone injections to her hip joints by Dr. Ronne Binning.  She was evaluated by 2 orthopedic surgeons and was told that she needs bilateral total knee replacement and she is been advised to lose weight.  She has difficulty walking and climbing the stairs.  She states about 2 months ago she developed hand pain and stiffness.  She has been having difficulty making a fist.  She also complains of discomfort in her right shoulder left elbow, bilateral wrist joints, bilateral hands, bilateral knee joints, bilateral knees and her feet.  She has not noticed swelling in her joints but has difficulty making a fist.  Activities of Daily Living:  Patient reports morning stiffness for 1 hour.   Patient Reports nocturnal pain.  Difficulty dressing/grooming: Reports Difficulty climbing stairs: Reports Difficulty getting out of chair: Reports Difficulty using hands for taps, buttons, cutlery, and/or writing: Reports  Review of Systems  Constitutional: Positive for fatigue. Negative for night sweats, weight gain and weight loss.  HENT: Positive for mouth dryness. Negative for mouth sores, trouble swallowing, trouble swallowing and nose dryness.   Eyes: Negative for pain, redness, visual disturbance and dryness.  Respiratory: Negative for cough, shortness of breath  and difficulty breathing.   Cardiovascular: Negative for chest pain, palpitations, hypertension, irregular heartbeat and swelling in legs/feet.  Gastrointestinal: Positive for diarrhea. Negative for blood in stool and constipation.  Endocrine: Negative for increased urination.  Genitourinary: Negative for vaginal dryness.  Musculoskeletal: Positive for arthralgias, joint pain, joint swelling and morning stiffness. Negative for myalgias, muscle weakness, muscle tenderness and myalgias.  Skin: Negative for color change, rash, hair loss, skin tightness, ulcers and sensitivity to sunlight.  Allergic/Immunologic: Negative for susceptible to infections.  Neurological: Negative for dizziness, memory loss, night sweats and weakness.  Hematological: Negative for swollen glands.  Psychiatric/Behavioral: Negative for depressed mood and sleep disturbance. The patient is not nervous/anxious.     PMFS History:  Patient Active Problem List   Diagnosis Date Noted  . Tonsillitis 05/02/2018  . Hypokalemia 09/03/2017  . Acute prerenal failure ([place]) 09/03/2017  . OSA (obstructive sleep apnea) 12/10/2016  . GERD (gastroesophageal reflux disease) 12/04/2016  . Hypersomnia 10/29/2016  . Back pain 08/21/2016  . Chronic pain of multiple joints 08/13/2016  . MGUS (monoclonal gammopathy of unknown significance) 07/02/2016  . Vitamin D deficiency 07/02/2016  . Diarrhea 07/02/2016  . Joint pain 04/03/2016  . HTN (hypertension), benign 11/11/2015  . Morbid obesity ([place]) 11/11/2015  . Adrenal mass ([place]) 08/30/2015  . Ovarian cyst 08/30/2015    Past Medical History:  Diagnosis Date  . Arthritis   . Gallstones   . GERD (gastroesophageal reflux disease)   .  Hypertension   . Obesity   . Osteoarthritis   . PONV (postoperative nausea and vomiting)   . Sleep apnea    SENT CPAP BACK, NOT WORN IN 7-8 MONTHS    Family History  Problem Relation Age of Onset  . Arthritis Mother   . High blood pressure Mother     . Sleep apnea Mother   . High blood pressure Father   . Breast cancer Paternal Aunt   . Arthritis Sister   . Healthy Daughter   . Healthy Son   . Adrenal disorder Neg Hx   . Colon cancer Neg Hx   . Esophageal cancer Neg Hx   . Pancreatic cancer Neg Hx   . Stomach cancer Neg Hx   . Liver disease Neg Hx    Past Surgical History:  Procedure Laterality Date  . BREAST DUCTAL SYSTEM EXCISION Bilateral 10/12/2017   Procedure: EXCISION OF BILATERAL CENTRAL DUCT;  Surgeon: Erroll Luna, MD;  Location: [place];  Service: General;  Laterality: Bilateral;  . BREAST EXCISIONAL BIOPSY    . CHOLECYSTECTOMY N/A 12/21/2015   Procedure: LAPAROSCOPIC CHOLECYSTECTOMY;  Surgeon: Georganna Skeans, MD;  Location: [place];  Service: General;  Laterality: N/A;  . UMBILICAL HERNIA REPAIR N/A 12/27/2015   Procedure: INCARCERATED UMBILICAL HERNIA REPAIR;  Surgeon: Coralie Keens, MD;  Location: [place];  Service: General;  Laterality: N/A;   Social History   Social History Narrative   Lives with husband, Berneta Sages.    Objective: Vital Signs: BP (!) 142/92 (BP Location: Right Arm, Patient Position: Sitting, Cuff Size: Large)   Pulse 90   Resp 16   Ht 5' 8.5" (1.74 m)   Wt 295 lb 12.8 oz (134.2 kg)   BMI 44.32 kg/m    Physical Exam  Constitutional: She is oriented to person, place, and time. She appears well-developed and well-nourished.  HENT:  Head: Normocephalic and atraumatic.  Eyes: Conjunctivae and EOM are normal.  Neck: Normal range of motion.  Cardiovascular: Normal rate, regular rhythm, normal heart sounds and intact distal pulses.  Pulmonary/Chest: Effort normal and breath sounds normal.  Abdominal: Soft. Bowel sounds are normal.  Lymphadenopathy:    She has no cervical adenopathy.  Neurological: She is alert and oriented to person, place, and time.  Skin: Skin is warm and dry. Capillary refill takes less than 2 seconds.  Psychiatric: She has a normal mood and affect. Her behavior is normal.   Nursing note and vitals reviewed.    Musculoskeletal Exam: C-spine and thoracic spine were in good range of motion.  She has limited range of motion of her lumbar spine.  Shoulder joints were in good range of motion with discomfort.  Elbow joints and wrist joints with good range of motion.  She has difficulty making a fist.  She has tenderness on her right first MCP joints.  She has some tenderness over PIP joints.  No synovitis was noted.  She had discomfort range of motion of her hip joints and knee joints.  No warmth swelling or effusion was noted.  She has tenderness across MTP joints without any synovitis.  CDAI Exam: CDAI Score: 5.1  Patient Global Assessment: 7 (mm); Provider Global Assessment: 4 (mm) Swollen: 0 ; Tender: 14  Joint Exam      Right  Left  Glenohumeral   Tender     IP   Tender     Knee   Tender   Tender  MTP 1   Tender   Tender  MTP 2   Tender   Tender  MTP 3   Tender   Tender  MTP 4   Tender   Tender  MTP 5   Tender   Tender     Investigation: Findings:  03/17/18: Vitamin B12 364, folate 4.1, T3 117, T4 1.38, TSH 0.628 06/24/18: Uric acid 5.7, Sed rate 130, RF 83, ANA -  Component     Latest Ref Rng & Units 06/24/2018  Uric Acid, Serum     2.4 - 7.0 mg/dL 5.7  Sed Rate     0 - 20 mm/hr 130 (H)  RA Latex Turbid.     <14 IU/mL 83 (H)  Anti Nuclear Antibody(ANA)     NEGATIVE NEGATIVE   Component     Latest Ref Rng & Units 03/17/2018  Vitamin B12     232 - 1,245 pg/mL 364  Folate     >3.0 ng/mL 4.1  Triiodothyronine (T3)     71 - 180 ng/dL 117  T4,Free(Direct)     0.82 - 1.77 ng/dL 1.38  TSH     0.450 - 4.500 uIU/mL 0.628   Imaging: Xr Foot 2 Views Left  Result Date: 07/25/2018 First MTP, all PIP and DIP narrowing was noted.  No intertarsal joint space narrowing was noted.  Dorsal spurring was noted.  Calcaneal spur was noted.  No erosive changes were noted. Impression: These findings are consistent with osteoarthritis of the foot.  Xr Foot 2  Views Right  Result Date: 07/25/2018 First MTP, all PIP and DIP narrowing was noted.  No intertarsal joint space narrowing was noted.  Dorsal spurring was noted.  Calcaneal spur was noted.  No erosive changes were noted. Impression: These findings are consistent with osteoarthritis of the foot.   Recent Labs: Lab Results  Component Value Date   WBC 8.6 06/24/2018   HGB 12.9 06/24/2018   PLT 304.0 06/24/2018   NA 140 06/24/2018   K 3.7 06/24/2018   CL 105 06/24/2018   CO2 29 06/24/2018   GLUCOSE 79 06/24/2018   BUN 15 06/24/2018   CREATININE 1.09 06/24/2018   BILITOT 0.4 06/24/2018   ALKPHOS 55 06/24/2018   AST 14 06/24/2018   ALT 6 06/24/2018   PROT 7.8 06/24/2018   ALBUMIN 3.8 06/24/2018   CALCIUM 9.1 06/24/2018   GFRAA 62 03/17/2018    Speciality Comments: No specialty comments available.  Procedures:  No procedures performed Allergies: Patient has no known allergies.   Assessment / Plan:     Visit Diagnoses: Polyarthralgia-patient complains of pain in multiple joints.  There was no synovitis on examination.  She has tenderness of the MCPs and PIPs in her hands.  She has tenderness across MTPs but no synovitis was noted.  She also has underlying osteoarthritis which causes discomfort.  Pain in both hands-tenderness was noted on palpation.  I will obtain ultrasound of bilateral hands to evaluate this further.  Rheumatoid factor positive - 06/24/18: Uric acid 5.7, Sed rate 130, RF 83, ANA -patient is significantly sedimentation rate and positive rheumatoid factor.  Primary osteoarthritis of both hips-I reviewed her x-rays from 2018 which showed mild osteoarthritic changes.  Primary osteoarthritis of both knees-patient had x-rays done in January 05, 2017 which were reviewed which showed right knee joint severe lateral compartment narrowing and left knee joint severe medial compartment narrowing.  She also has severe patellofemoral narrowing.  She has been advised total knee  replacement by 2 orthopedic surgeons.  She is trying to lose  weight currently.  Pain in both feet-patient had tenderness on palpation of my joint with no synovitis was noted.  I will obtain x-rays today.  X-rays were consistent with osteoarthritis of the feet.  Essential hypertension  OSA (obstructive sleep apnea)  MGUS (monoclonal gammopathy of unknown significance)-she is followed by oncology.  Vitamin D deficiency-patient from vitamin D supplement.  History of gastroesophageal reflux (GERD)   Orders: Orders Placed This Encounter  Procedures  . XR Foot 2 Views Right  . XR Foot 2 Views Left  . Urinalysis, Routine w reflex microscopic  . CK  . Sedimentation rate  . Uric acid  . Cyclic citrul peptide antibody, IgG  . ANA  . Hepatitis B core antibody, IgM  . Hepatitis B surface antigen  . Hepatitis C antibody  . HIV antibody  . QuantiFERON-TB Gold Plus  . IgG, IgA, IgM  . Glucose 6 phosphate dehydrogenase   No orders of the defined types were placed in this encounter.   Face-to-face time spent with patient was 50 minutes. Greater than 50% of time was spent in counseling and coordination of care.  Follow-Up Instructions: Return for Polyarthralgia, positive rheumatoid factor.   Bo Merino, MD  Note - This record has been created using Editor, commissioning.  Chart creation errors have been sought, but may not always  have been located. Such creation errors do not reflect on  the standard of medical care.

## 2018-07-25 ENCOUNTER — Ambulatory Visit (INDEPENDENT_AMBULATORY_CARE_PROVIDER_SITE_OTHER): Payer: Self-pay

## 2018-07-25 ENCOUNTER — Encounter: Payer: Self-pay | Admitting: Rheumatology

## 2018-07-25 ENCOUNTER — Ambulatory Visit (INDEPENDENT_AMBULATORY_CARE_PROVIDER_SITE_OTHER): Payer: BLUE CROSS/BLUE SHIELD | Admitting: Rheumatology

## 2018-07-25 ENCOUNTER — Other Ambulatory Visit: Payer: Self-pay | Admitting: Pharmacist

## 2018-07-25 VITALS — BP 142/92 | HR 90 | Resp 16 | Ht 68.5 in | Wt 295.8 lb

## 2018-07-25 DIAGNOSIS — Z79899 Other long term (current) drug therapy: Secondary | ICD-10-CM | POA: Diagnosis not present

## 2018-07-25 DIAGNOSIS — M2550 Pain in unspecified joint: Secondary | ICD-10-CM | POA: Diagnosis not present

## 2018-07-25 DIAGNOSIS — M160 Bilateral primary osteoarthritis of hip: Secondary | ICD-10-CM

## 2018-07-25 DIAGNOSIS — M79671 Pain in right foot: Secondary | ICD-10-CM

## 2018-07-25 DIAGNOSIS — M79672 Pain in left foot: Secondary | ICD-10-CM

## 2018-07-25 DIAGNOSIS — R5383 Other fatigue: Secondary | ICD-10-CM | POA: Diagnosis not present

## 2018-07-25 DIAGNOSIS — G4733 Obstructive sleep apnea (adult) (pediatric): Secondary | ICD-10-CM

## 2018-07-25 DIAGNOSIS — I10 Essential (primary) hypertension: Secondary | ICD-10-CM | POA: Diagnosis not present

## 2018-07-25 DIAGNOSIS — R768 Other specified abnormal immunological findings in serum: Secondary | ICD-10-CM | POA: Diagnosis not present

## 2018-07-25 DIAGNOSIS — M170 Bilateral primary osteoarthritis of knee: Secondary | ICD-10-CM

## 2018-07-25 DIAGNOSIS — M5136 Other intervertebral disc degeneration, lumbar region: Secondary | ICD-10-CM

## 2018-07-25 DIAGNOSIS — Z8719 Personal history of other diseases of the digestive system: Secondary | ICD-10-CM

## 2018-07-25 DIAGNOSIS — E559 Vitamin D deficiency, unspecified: Secondary | ICD-10-CM

## 2018-07-25 DIAGNOSIS — D472 Monoclonal gammopathy: Secondary | ICD-10-CM

## 2018-07-26 NOTE — Progress Notes (Signed)
Labs are consistent with rheumatoid arthritis.  I plan to start on treatment at follow-up visit.

## 2018-07-27 LAB — HEPATITIS C ANTIBODY
HEP C AB: NONREACTIVE
SIGNAL TO CUT-OFF: 0.02 (ref <1.00)

## 2018-07-27 LAB — QUANTIFERON-TB GOLD PLUS
NIL: 0.03 IU/mL
QuantiFERON-TB Gold Plus: POSITIVE — AB
TB1-NIL: 0.44 IU/mL
TB2-NIL: 0.36 IU/mL

## 2018-07-27 LAB — URINALYSIS, ROUTINE W REFLEX MICROSCOPIC
Bilirubin Urine: NEGATIVE
GLUCOSE, UA: NEGATIVE
HGB URINE DIPSTICK: NEGATIVE
Ketones, ur: NEGATIVE
LEUKOCYTES UA: NEGATIVE
Nitrite: NEGATIVE
PH: 5.5 (ref 5.0–8.0)
Protein, ur: NEGATIVE
SPECIFIC GRAVITY, URINE: 1.018 (ref 1.001–1.03)

## 2018-07-27 LAB — GLUCOSE 6 PHOSPHATE DEHYDROGENASE: G-6PDH: 13.5 U/g Hgb (ref 7.0–20.5)

## 2018-07-27 LAB — CK: Total CK: 61 U/L (ref 29–143)

## 2018-07-27 LAB — URIC ACID: Uric Acid, Serum: 5.9 mg/dL (ref 2.5–7.0)

## 2018-07-27 LAB — HIV ANTIBODY (ROUTINE TESTING W REFLEX): HIV 1&2 Ab, 4th Generation: NONREACTIVE

## 2018-07-27 LAB — HEPATITIS B SURFACE ANTIGEN: HEP B S AG: NONREACTIVE

## 2018-07-27 LAB — IGG, IGA, IGM
IMMUNOGLOBULIN A: 411 mg/dL — AB (ref 47–310)
IgG (Immunoglobin G), Serum: 1634 mg/dL (ref 600–1640)
IgM, Serum: 76 mg/dL (ref 50–300)

## 2018-07-27 LAB — CYCLIC CITRUL PEPTIDE ANTIBODY, IGG: CYCLIC CITRULLIN PEPTIDE AB: 99 U — AB

## 2018-07-27 LAB — SEDIMENTATION RATE: Sed Rate: 29 mm/h — ABNORMAL HIGH (ref 0–20)

## 2018-07-27 LAB — HEPATITIS B CORE ANTIBODY, IGM: HEP B C IGM: NONREACTIVE

## 2018-07-27 LAB — ANA: ANA: NEGATIVE

## 2018-07-29 ENCOUNTER — Ambulatory Visit (INDEPENDENT_AMBULATORY_CARE_PROVIDER_SITE_OTHER): Payer: BLUE CROSS/BLUE SHIELD | Admitting: Rheumatology

## 2018-07-29 ENCOUNTER — Encounter: Payer: Self-pay | Admitting: Rheumatology

## 2018-07-29 ENCOUNTER — Ambulatory Visit (INDEPENDENT_AMBULATORY_CARE_PROVIDER_SITE_OTHER): Payer: Self-pay

## 2018-07-29 VITALS — BP 98/70 | HR 80 | Resp 16 | Ht 68.0 in | Wt 286.0 lb

## 2018-07-29 DIAGNOSIS — M160 Bilateral primary osteoarthritis of hip: Secondary | ICD-10-CM | POA: Diagnosis not present

## 2018-07-29 DIAGNOSIS — M19072 Primary osteoarthritis, left ankle and foot: Secondary | ICD-10-CM

## 2018-07-29 DIAGNOSIS — M0579 Rheumatoid arthritis with rheumatoid factor of multiple sites without organ or systems involvement: Secondary | ICD-10-CM | POA: Diagnosis not present

## 2018-07-29 DIAGNOSIS — M19071 Primary osteoarthritis, right ankle and foot: Secondary | ICD-10-CM

## 2018-07-29 DIAGNOSIS — M170 Bilateral primary osteoarthritis of knee: Secondary | ICD-10-CM | POA: Insufficient documentation

## 2018-07-29 DIAGNOSIS — Z79899 Other long term (current) drug therapy: Secondary | ICD-10-CM | POA: Diagnosis not present

## 2018-07-29 DIAGNOSIS — K219 Gastro-esophageal reflux disease without esophagitis: Secondary | ICD-10-CM

## 2018-07-29 DIAGNOSIS — D472 Monoclonal gammopathy: Secondary | ICD-10-CM

## 2018-07-29 DIAGNOSIS — I10 Essential (primary) hypertension: Secondary | ICD-10-CM

## 2018-07-29 DIAGNOSIS — M79641 Pain in right hand: Secondary | ICD-10-CM

## 2018-07-29 DIAGNOSIS — E559 Vitamin D deficiency, unspecified: Secondary | ICD-10-CM

## 2018-07-29 DIAGNOSIS — G4733 Obstructive sleep apnea (adult) (pediatric): Secondary | ICD-10-CM

## 2018-07-29 DIAGNOSIS — M79642 Pain in left hand: Secondary | ICD-10-CM

## 2018-07-29 MED ORDER — HYDROXYCHLOROQUINE SULFATE 200 MG PO TABS: 200.0000 mg | ORAL_TABLET | Freq: Two times a day (BID) | ORAL | 0 refills | Status: DC

## 2018-07-29 NOTE — Progress Notes (Signed)
Pharmacy Counseling Note  Patient was counseled on the purpose, proper use, and adverse effects of hydroxychloroquine including nausea/diarrhea, skin rash, headaches, and sun sensitivity.  Discussed importance of annual eye exams while on hydroxychloroquine to monitor to ocular toxicity and discussed importance of frequent laboratory monitoring.  Provided patient with eye exam form for baseline ophthalmologic exam.  Provided patient with educational materials on hydroxychloroquine and answered all questions.  Patient consented to hydroxychloroquine.  Will upload consent in the media tab.    Mariella Saa, PharmD, [facility] Clinical Pharmacist  07/29/2018 1:47 PM

## 2018-07-29 NOTE — Progress Notes (Addendum)
Office Visit Note  Patient: Alexandra Powers             Date of Birth: Oct 04, 1971           MRN: [id number]             PCP: Binnie Rail, MD Referring: Binnie Rail, MD Visit Date: 07/29/2018 Occupation: '@GUAROCC' @  Subjective:  Pain in hands and feet   History of Present Illness: Alexandra Powers is a 47 y.o. female with history of inflammatory arthritis.  She states she continues to have pain and discomfort in her bilateral hands and bilateral feet.  She has difficulty holding objects.  She has difficulty walking in the morning.  She also has discomfort in her right shoulder, left elbow and her bilateral knee joints.  She has ongoing lower back pain.  Activities of Daily Living:  Patient reports morning stiffness for 30 minutes.   Patient Denies nocturnal pain.  Difficulty dressing/grooming: Denies Difficulty climbing stairs: Reports Difficulty getting out of chair: Reports Difficulty using hands for taps, buttons, cutlery, and/or writing: Reports  Review of Systems  Constitutional: Positive for fatigue. Negative for night sweats, weight gain and weight loss.  HENT: Negative for mouth sores, trouble swallowing, trouble swallowing, mouth dryness and nose dryness.   Eyes: Negative for pain, redness, visual disturbance and dryness.  Respiratory: Negative for cough, shortness of breath and difficulty breathing.   Cardiovascular: Positive for hypertension. Negative for chest pain, palpitations, irregular heartbeat and swelling in legs/feet.  Gastrointestinal: Positive for diarrhea and heartburn. Negative for blood in stool and constipation.  Endocrine: Negative for increased urination.  Genitourinary: Negative for vaginal dryness.  Musculoskeletal: Positive for arthralgias, joint pain, joint swelling and morning stiffness. Negative for myalgias, muscle weakness, muscle tenderness and myalgias.  Skin: Negative for color change, rash, hair loss, skin tightness, ulcers and sensitivity to  sunlight.  Allergic/Immunologic: Negative for susceptible to infections.  Neurological: Negative for dizziness, memory loss, night sweats and weakness.  Hematological: Negative for swollen glands.  Psychiatric/Behavioral: Negative for depressed mood and sleep disturbance. The patient is not nervous/anxious.     PMFS History:  Patient Active Problem List   Diagnosis Date Noted  . Primary osteoarthritis of both hips 07/29/2018  . Primary osteoarthritis of both knees 07/29/2018  . Primary osteoarthritis of both feet 07/29/2018  . Tonsillitis 05/02/2018  . Hypokalemia 09/03/2017  . Acute prerenal failure ([facility]) 09/03/2017  . OSA (obstructive sleep apnea) 12/10/2016  . GERD (gastroesophageal reflux disease) 12/04/2016  . Hypersomnia 10/29/2016  . Back pain 08/21/2016  . Chronic pain of multiple joints 08/13/2016  . MGUS (monoclonal gammopathy of unknown significance) 07/02/2016  . Vitamin D deficiency 07/02/2016  . Diarrhea 07/02/2016  . Joint pain 04/03/2016  . HTN (hypertension), benign 11/11/2015  . Morbid obesity ([place] [facility]) 11/11/2015  . Adrenal mass ([facility]) 08/30/2015  . Ovarian cyst 08/30/2015    Past Medical History:  Diagnosis Date  . Arthritis   . Gallstones   . GERD (gastroesophageal reflux disease)   . Hypertension   . Obesity   . Osteoarthritis   . PONV (postoperative nausea and vomiting)   . Sleep apnea    SENT CPAP BACK, NOT WORN IN 7-8 MONTHS    Family History  Problem Relation Age of Onset  . Arthritis Mother   . High blood pressure Mother   . Sleep apnea Mother   . High blood pressure Father   . Breast cancer Paternal Aunt   . Arthritis Sister   .  Healthy Daughter   . Healthy Son   . Adrenal disorder Neg Hx   . Colon cancer Neg Hx   . Esophageal cancer Neg Hx   . Pancreatic cancer Neg Hx   . Stomach cancer Neg Hx   . Liver disease Neg Hx    Past Surgical History:  Procedure Laterality Date  . BREAST DUCTAL SYSTEM EXCISION Bilateral 10/12/2017    Procedure: EXCISION OF BILATERAL CENTRAL DUCT;  Surgeon: Erroll Luna, MD;  Location: [facility];  Service: General;  Laterality: Bilateral;  . BREAST EXCISIONAL BIOPSY    . CHOLECYSTECTOMY N/A 12/21/2015   Procedure: LAPAROSCOPIC CHOLECYSTECTOMY;  Surgeon: Georganna Skeans, MD;  Location: [facility];  Service: General;  Laterality: N/A;  . UMBILICAL HERNIA REPAIR N/A 12/27/2015   Procedure: INCARCERATED UMBILICAL HERNIA REPAIR;  Surgeon: Coralie Keens, MD;  Location: [facility];  Service: General;  Laterality: N/A;   Social History   Social History Narrative   Lives with husband, Berneta Sages.    Objective: Vital Signs: BP 98/70 (BP Location: Left Arm, Patient Position: Sitting, Cuff Size: Large)   Pulse 80   Resp 16   Ht '5\' 8"'  (1.727 m)   Wt 286 lb (129.7 kg)   LMP 05/29/2018 (Approximate)   BMI 43.49 kg/m    Physical Exam  Constitutional: She is oriented to person, place, and time. She appears well-developed and well-nourished.  HENT:  Head: Normocephalic and atraumatic.  Eyes: Conjunctivae and EOM are normal.  Neck: Normal range of motion.  Cardiovascular: Normal rate, regular rhythm, normal heart sounds and intact distal pulses.  Pulmonary/Chest: Effort normal and breath sounds normal.  Abdominal: Soft. Bowel sounds are normal.  Lymphadenopathy:    She has no cervical adenopathy.  Neurological: She is alert and oriented to person, place, and time.  Skin: Skin is warm and dry. Capillary refill takes less than 2 seconds.  Psychiatric: She has a normal mood and affect. Her behavior is normal.  Nursing note and vitals reviewed.    Musculoskeletal Exam: C-spine thoracic lumbar spine good range of motion.  Shoulder joints and elbow joints were in good range of motion.  She discomfort range of motion of her right shoulder.  She had tenderness over MCPs and PIPs and bilateral wrist joints without any obvious synovitis.  Hip joints knee joints ankles MTPs PIPs DIPs been good range of motion.  She  has tenderness across MTPs and PIP joints.  CDAI Exam: CDAI Score: 14  Patient Global Assessment: 5 (mm); Provider Global Assessment: 5 (mm) Swollen: 0 ; Tender: 18  Joint Exam      Right  Left  Wrist   Tender   Tender  MCP 2   Tender   Tender  MCP 3   Tender   Tender  MCP 4   Tender   Tender  MCP 5   Tender   Tender  PIP 2   Tender     PIP 3   Tender   Tender  MTP 2   Tender   Tender  MTP 3   Tender   Tender  MTP 4      Tender     Investigation: No additional findings.  Imaging: [place] Extrem Up Bilat Comp  Result Date: 07/29/2018 Ultrasound examination of bilateral hands was performed per EULAR recommendations. Using 12 MHz transducer, grayscale and power Doppler bilateral second, third, and fifth MCP joints and bilateral wrist joints both dorsal and volar aspects were evaluated to look for synovitis or tenosynovitis. The findings  were there was synovitis over bilateral second, right fifth and bilateral wrist joint, no erosive changes were noted on ultrasound examination. Right median nerve was 0.10 cm squares which was in normal limits and left median nerve was 0.12 cm squares which was at the upper limits of normal. Impression: Ultrasound examination was consistent with inflammatory arthritis.  Left median nerve was at the upper limits of normal.  Xr Foot 2 Views Left  Result Date: 07/25/2018 First MTP, all PIP and DIP narrowing was noted.  No intertarsal joint space narrowing was noted.  Dorsal spurring was noted.  Calcaneal spur was noted.  No erosive changes were noted. Impression: These findings are consistent with osteoarthritis of the foot.  Xr Foot 2 Views Right  Result Date: 07/25/2018 First MTP, all PIP and DIP narrowing was noted.  No intertarsal joint space narrowing was noted.  Dorsal spurring was noted.  Calcaneal spur was noted.  No erosive changes were noted. Impression: These findings are consistent with osteoarthritis of the foot.   Recent Labs: Lab Results    Component Value Date   WBC 8.6 06/24/2018   HGB 12.9 06/24/2018   PLT 304.0 06/24/2018   NA 140 06/24/2018   K 3.7 06/24/2018   CL 105 06/24/2018   CO2 29 06/24/2018   GLUCOSE 79 06/24/2018   BUN 15 06/24/2018   CREATININE 1.09 06/24/2018   BILITOT 0.4 06/24/2018   ALKPHOS 55 06/24/2018   AST 14 06/24/2018   ALT 6 06/24/2018   PROT 7.8 06/24/2018   ALBUMIN 3.8 06/24/2018   CALCIUM 9.1 06/24/2018   GFRAA 62 03/17/2018   QFTBGOLDPLUS POSITIVE (A) 07/25/2018  UA neg, ESR 29, CK 61, ANA negative, anti-CCP 99, hepatitis B-, hepatitis C negative, HIV negative, G6PD normal, immunoglobulins normal  Speciality Comments: No specialty comments available.  Procedures:  No procedures performed Allergies: Patient has no known allergies.   Assessment / Plan:     Visit Diagnoses: Rheumatoid arthritis involving multiple sites with positive rheumatoid factor (HCC) - Positive RF, positive anti-CCP, positive elevated sedimentation rate, positive synovitis on ultrasound.  Detailed counseling regarding rheumatoid arthritis was provided.  Different treatment options and their side effects were discussed.  She was in agreement to proceed with Plaquenil.  She has been advised to get baseline eye examinations and eye exam on yearly basis.  She has been also advised to get baseline immunization.  Handout was given and consent was taken.- Plan: hydroxychloroquine (PLAQUENIL) 200 MG tablet, 1 tablet p.o. twice daily, [place] Extrem Up Bilat Comp showed synovitis.  We will check labs in a month, 3 months and then every 5 months to monitor for drug toxicity.  Patient was counseled on the purpose, proper use, and adverse effects of hydroxychloroquine including nausea/diarrhea, skin rash, headaches, and sun sensitivity.  Discussed importance of annual eye exams while on hydroxychloroquine to monitor to ocular toxicity and discussed importance of frequent laboratory monitoring.  Provided patient with eye exam form for  baseline ophthalmologic exam.  Provided patient with educational materials on hydroxychloroquine and answered all questions.  Patient consented to hydroxychloroquine.  Will upload consent in the media tab.    Pain in both hands-patient has synovitis on examination.  Primary osteoarthritis of both hips - Bilateral mild  Primary osteoarthritis of both knees - Right severe lateral and left severe medial compartment narrowing, bilateral severe patellofemoral narrowing  Primary osteoarthritis of both feet  Positive TB Gold-I will repeat the test today.  If her repeat test is positive we  will refer her to health department.  HTN (hypertension), benign  Gastroesophageal reflux disease, esophagitis presence not specified  MGUS (monoclonal gammopathy of unknown significance)  OSA (obstructive sleep apnea)  Vitamin D deficiency  High risk medication use - Plan: QuantiFERON-TB Gold Plus   Orders: Orders Placed This Encounter  Procedures  . [place] Extrem Up Bilat Comp  . QuantiFERON-TB Gold Plus   Meds ordered this encounter  Medications  . hydroxychloroquine (PLAQUENIL) 200 MG tablet    Sig: Take 1 tablet (200 mg total) by mouth 2 (two) times daily.    Dispense:  180 tablet    Refill:  0    Face-to-face time spent with patient was 30 minutes. Greater than 50% of time was spent in counseling and coordination of care.  Follow-Up Instructions: Return in about 3 months (around 10/29/2018) for Rheumatoid arthritis.   Bo Merino, MD  Note - This record has been created using Editor, commissioning.  Chart creation errors have been sought, but may not always  have been located. Such creation errors do not reflect on  the standard of medical care.

## 2018-07-29 NOTE — Patient Instructions (Addendum)
Follow-Up  Please make follow up visit in 3 months.  Standing Labs We placed an order today for your standing lab work.    Please come back and get your standing labs in 1 month, 3 months and then every 5 months.  We have open lab Monday through Friday from 8:30-11:30 AM and 1:30-4:00 PM  at the office of Dr. Bo Merino.   You may experience shorter wait times on Monday and Friday afternoons. The office is located at [address], [place], [place], [place] [address] No appointment is necessary.   Labs are drawn by [facility].  You may receive a bill from [facility] for your lab work. If you have any questions regarding directions or hours of operation,  please call [phone].    Hydroxychloroquine tablets What is this medicine? HYDROXYCHLOROQUINE (hye drox ee KLOR oh kwin) is used to treat rheumatoid arthritis and systemic lupus erythematosus. It is also used to treat malaria. This medicine may be used for other purposes; ask your health care provider or pharmacist if you have questions. COMMON BRAND NAME(S): Plaquenil, Quineprox What should I tell my health care provider before I take this medicine? They need to know if you have any of these conditions: -diabetes -eye disease, vision problems -G6PD deficiency -history of blood diseases -history of irregular heartbeat -if you often drink alcohol -kidney disease -liver disease -porphyria -psoriasis -seizures -an unusual or allergic reaction to chloroquine, hydroxychloroquine, other medicines, foods, dyes, or preservatives -pregnant or trying to get pregnant -breast-feeding How should I use this medicine? Take this medicine by mouth with a glass of water. Follow the directions on the prescription label. Avoid taking antacids within 4 hours of taking this medicine. It is best to separate these medicines by at least 4 hours. Do not cut, crush or chew this medicine. You can take it with or without food. If it upsets your  stomach, take it with food. Take your medicine at regular intervals. Do not take your medicine more often than directed. Take all of your medicine as directed even if you think you are better. Do not skip doses or stop your medicine early. Talk to your pediatrician regarding the use of this medicine in children. While this drug may be prescribed for selected conditions, precautions do apply. Overdosage: If you think you have taken too much of this medicine contact a poison control center or emergency room at once. NOTE: This medicine is only for you. Do not share this medicine with others. What if I miss a dose? If you miss a dose, take it as soon as you can. If it is almost time for your next dose, take only that dose. Do not take double or extra doses. What may interact with this medicine? Do not take this medicine with any of the following medications: -cisapride -dofetilide -dronedarone -live virus vaccines -penicillamine -pimozide -thioridazine -ziprasidone This medicine may also interact with the following medications: -ampicillin -antacids -cimetidine -cyclosporine -digoxin -medicines for diabetes, like insulin, glipizide, glyburide -medicines for seizures like carbamazepine, phenobarbital, phenytoin -mefloquine -methotrexate -other medicines that prolong the QT interval (cause an abnormal heart rhythm) -praziquantel This list may not describe all possible interactions. Give your health care provider a list of all the medicines, herbs, non-prescription drugs, or dietary supplements you use. Also tell them if you smoke, drink alcohol, or use illegal drugs. Some items may interact with your medicine. What should I watch for while using this medicine? Tell your doctor or healthcare professional if your symptoms  do not start to get better or if they get worse. Avoid taking antacids within 4 hours of taking this medicine. It is best to separate these medicines by at least 4  hours. Tell your doctor or health care professional right away if you have any change in your eyesight. Your vision and blood may be tested before and during use of this medicine. This medicine can make you more sensitive to the sun. Keep out of the sun. If you cannot avoid being in the sun, wear protective clothing and use sunscreen. Do not use sun lamps or tanning beds/booths. What side effects may I notice from receiving this medicine? Side effects that you should report to your doctor or health care professional as soon as possible: -allergic reactions like skin rash, itching or hives, swelling of the face, lips, or tongue -changes in vision -decreased hearing or ringing of the ears -redness, blistering, peeling or loosening of the skin, including inside the mouth -seizures -sensitivity to light -signs and symptoms of a dangerous change in heartbeat or heart rhythm like chest pain; dizziness; fast or irregular heartbeat; palpitations; feeling faint or lightheaded, falls; breathing problems -signs and symptoms of liver injury like dark yellow or brown urine; general ill feeling or flu-like symptoms; light-colored stools; loss of appetite; nausea; right upper belly pain; unusually weak or tired; yellowing of the eyes or skin -signs and symptoms of low blood sugar such as feeling anxious; confusion; dizziness; increased hunger; unusually weak or tired; sweating; shakiness; cold; irritable; headache; blurred vision; fast heartbeat; loss of consciousness -uncontrollable head, mouth, neck, arm, or leg movements Side effects that usually do not require medical attention (report to your doctor or health care professional if they continue or are bothersome): -anxious -diarrhea -dizziness -hair loss -headache -irritable -loss of appetite -nausea, vomiting -stomach pain This list may not describe all possible side effects. Call your doctor for medical advice about side effects. You may report side  effects to FDA at 1-800-FDA-1088. Where should I keep my medicine? Keep out of the reach of children. In children, this medicine can cause overdose with small doses. Store at room temperature between 15 and 30 degrees C (59 and 86 degrees F). Protect from moisture and light. Throw away any unused medicine after the expiration date. NOTE: This sheet is a summary. It may not cover all possible information. If you have questions about this medicine, talk to your doctor, pharmacist, or health care provider.  2018 Elsevier/Gold Standard (2016-07-01 14:16:15)

## 2018-07-31 LAB — QUANTIFERON-TB GOLD PLUS
NIL: 0.06 IU/mL
QuantiFERON-TB Gold Plus: POSITIVE — AB
TB1-NIL: 0.41 IU/mL
TB2-NIL: 0.43 [IU]/mL

## 2018-08-02 ENCOUNTER — Telehealth: Payer: Self-pay | Admitting: *Deleted

## 2018-08-02 DIAGNOSIS — R7612 Nonspecific reaction to cell mediated immunity measurement of gamma interferon antigen response without active tuberculosis: Secondary | ICD-10-CM

## 2018-08-02 NOTE — Progress Notes (Signed)
Please place referral to health department for evaluation of positive TB gold x2.

## 2018-08-02 NOTE — Telephone Encounter (Signed)
-----   Message from Ofilia Neas, PA-C sent at 08/02/2018 10:28 AM EDT ----- Please place referral to health department for evaluation of positive TB gold x2.

## 2018-08-03 ENCOUNTER — Other Ambulatory Visit: Payer: BLUE CROSS/BLUE SHIELD | Admitting: Rheumatology

## 2018-08-05 ENCOUNTER — Other Ambulatory Visit: Payer: Self-pay | Admitting: Internal Medicine

## 2018-08-05 ENCOUNTER — Ambulatory Visit
Admission: RE | Admit: 2018-08-05 | Discharge: 2018-08-05 | Disposition: A | Discharge status: Home / Self Care | Payer: No Typology Code available for payment source | Source: Ambulatory Visit | Attending: Internal Medicine | Admitting: Internal Medicine

## 2018-08-05 DIAGNOSIS — A150 Tuberculosis of lung: Secondary | ICD-10-CM

## 2018-08-12 ENCOUNTER — Encounter: Payer: Self-pay | Admitting: Internal Medicine

## 2018-08-12 DIAGNOSIS — M059 Rheumatoid arthritis with rheumatoid factor, unspecified: Secondary | ICD-10-CM | POA: Diagnosis not present

## 2018-08-12 DIAGNOSIS — H25043 Posterior subcapsular polar age-related cataract, bilateral: Secondary | ICD-10-CM | POA: Diagnosis not present

## 2018-08-12 DIAGNOSIS — Z79899 Other long term (current) drug therapy: Secondary | ICD-10-CM | POA: Diagnosis not present

## 2018-08-12 LAB — HM DIABETES EYE EXAM

## 2018-08-26 ENCOUNTER — Other Ambulatory Visit: Payer: Self-pay | Admitting: Hematology and Oncology

## 2018-08-26 ENCOUNTER — Inpatient Hospital Stay: Payer: BLUE CROSS/BLUE SHIELD | Attending: Hematology and Oncology

## 2018-08-26 DIAGNOSIS — D472 Monoclonal gammopathy: Secondary | ICD-10-CM | POA: Diagnosis not present

## 2018-08-26 LAB — COMPREHENSIVE METABOLIC PANEL
ALT: 9 U/L (ref 0–44)
AST: 18 U/L (ref 15–41)
Albumin: 3.2 g/dL — ABNORMAL LOW (ref 3.5–5.0)
Alkaline Phosphatase: 56 U/L (ref 38–126)
Anion gap: 6 (ref 5–15)
BUN: 15 mg/dL (ref 6–20)
CHLORIDE: 108 mmol/L (ref 98–111)
CO2: 28 mmol/L (ref 22–32)
Calcium: 9 mg/dL (ref 8.9–10.3)
Creatinine, Ser: 1.07 mg/dL — ABNORMAL HIGH (ref 0.44–1.00)
Glucose, Bld: 93 mg/dL (ref 70–99)
POTASSIUM: 3.7 mmol/L (ref 3.5–5.1)
Sodium: 142 mmol/L (ref 135–145)
Total Bilirubin: 0.2 mg/dL — ABNORMAL LOW (ref 0.3–1.2)
Total Protein: 7 g/dL (ref 6.5–8.1)

## 2018-08-26 LAB — CBC WITH DIFFERENTIAL/PLATELET
BASOS ABS: 0.1 10*3/uL (ref 0.0–0.1)
Basophils Relative: 1 %
Eosinophils Absolute: 0.3 10*3/uL (ref 0.0–0.5)
Eosinophils Relative: 4 %
HCT: 33.7 % — ABNORMAL LOW (ref 34.8–46.6)
Hemoglobin: 11.4 g/dL — ABNORMAL LOW (ref 11.6–15.9)
LYMPHS ABS: 3.5 10*3/uL — AB (ref 0.9–3.3)
LYMPHS PCT: 42 %
MCH: 32.8 pg (ref 25.1–34.0)
MCHC: 33.6 g/dL (ref 31.5–36.0)
MCV: 97.6 fL (ref 79.5–101.0)
MONO ABS: 0.5 10*3/uL (ref 0.1–0.9)
Monocytes Relative: 6 %
Neutro Abs: 3.9 10*3/uL (ref 1.5–6.5)
Neutrophils Relative %: 47 %
PLATELETS: 269 10*3/uL (ref 145–400)
RBC: 3.46 MIL/uL — ABNORMAL LOW (ref 3.70–5.45)
RDW: 13.8 % (ref 11.2–14.5)
WBC: 8.3 10*3/uL (ref 3.9–10.3)

## 2018-08-27 LAB — BETA 2 MICROGLOBULIN, SERUM: Beta-2 Microglobulin: 2.1 mg/L (ref 0.6–2.4)

## 2018-08-29 ENCOUNTER — Ambulatory Visit: Payer: BLUE CROSS/BLUE SHIELD | Admitting: Rheumatology

## 2018-08-29 LAB — MULTIPLE MYELOMA PANEL, SERUM
ALBUMIN SERPL ELPH-MCNC: 3.1 g/dL (ref 2.9–4.4)
ALPHA 1: 0.2 g/dL (ref 0.0–0.4)
ALPHA2 GLOB SERPL ELPH-MCNC: 0.6 g/dL (ref 0.4–1.0)
Albumin/Glob SerPl: 1 (ref 0.7–1.7)
B-GLOBULIN SERPL ELPH-MCNC: 1.1 g/dL (ref 0.7–1.3)
GAMMA GLOB SERPL ELPH-MCNC: 1.4 g/dL (ref 0.4–1.8)
GLOBULIN, TOTAL: 3.3 g/dL (ref 2.2–3.9)
IGG (IMMUNOGLOBIN G), SERUM: 1419 mg/dL (ref 700–1600)
IgA: 366 mg/dL — ABNORMAL HIGH (ref 87–352)
IgM (Immunoglobulin M), Srm: 68 mg/dL (ref 26–217)
M PROTEIN SERPL ELPH-MCNC: 0.2 g/dL — AB
TOTAL PROTEIN ELP: 6.4 g/dL (ref 6.0–8.5)

## 2018-08-29 LAB — KAPPA/LAMBDA LIGHT CHAINS
Kappa free light chain: 40.2 mg/L — ABNORMAL HIGH (ref 3.3–19.4)
Kappa, lambda light chain ratio: 2.26 — ABNORMAL HIGH (ref 0.26–1.65)
Lambda free light chains: 17.8 mg/L (ref 5.7–26.3)

## 2018-09-02 ENCOUNTER — Telehealth: Payer: Self-pay | Admitting: Hematology and Oncology

## 2018-09-02 ENCOUNTER — Inpatient Hospital Stay: Payer: BLUE CROSS/BLUE SHIELD | Attending: Hematology and Oncology | Admitting: Hematology and Oncology

## 2018-09-02 ENCOUNTER — Encounter: Payer: Self-pay | Admitting: Hematology and Oncology

## 2018-09-02 DIAGNOSIS — G8929 Other chronic pain: Secondary | ICD-10-CM | POA: Insufficient documentation

## 2018-09-02 DIAGNOSIS — M1990 Unspecified osteoarthritis, unspecified site: Secondary | ICD-10-CM | POA: Diagnosis not present

## 2018-09-02 DIAGNOSIS — Z79899 Other long term (current) drug therapy: Secondary | ICD-10-CM | POA: Insufficient documentation

## 2018-09-02 DIAGNOSIS — E559 Vitamin D deficiency, unspecified: Secondary | ICD-10-CM | POA: Diagnosis not present

## 2018-09-02 DIAGNOSIS — D472 Monoclonal gammopathy: Secondary | ICD-10-CM | POA: Insufficient documentation

## 2018-09-02 DIAGNOSIS — M549 Dorsalgia, unspecified: Secondary | ICD-10-CM | POA: Insufficient documentation

## 2018-09-02 NOTE — Progress Notes (Signed)
[facility] OFFICE PROGRESS NOTE  Patient Care Team: Binnie Rail, MD as PCP - General (Internal Medicine) Hennie Duos, MD as Consulting Physician (Rheumatology)  ASSESSMENT & PLAN:  MGUS (monoclonal gammopathy of unknown significance) Her blood work is most consistent with MGUS. I discussed with her the natural history of MGUS. Her bone pain is not related to this. Recent blood work showed no significant signs of disease progression She would need close follow-up once a year with history, blood work and examination. She agreed  Vitamin D deficiency She has history of vitamin D deficiency I recommend oral vitamin D replacement therapy, over-the-counter   No orders of the defined types were placed in this encounter.   INTERVAL HISTORY: Please see below for problem oriented charting. She feels well.  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 The patient had problems with chronic back pain. She denies prior history of trauma. The pain has been present for over a year. She was prescribed intermittent doses of NSAID and Vicodin for this. The pains is located in the shoulder, the back, and the knees with associated morning stiffness. The patient had cholecystectomy several months ago and since then had postprandial nausea, vomiting usually within 30 minutes and associated diarrhea 4-5 times a day with abdominal cramps. She have lost almost 40 pounds over the past few months. She was subsequently referred to rheumatologist. On 05/20/2016, blood work show high sedimentation rate of 67, C-reactive protein of 11.9, abnormal serum protein electrophoresis with detectable M spike but negative autoimmune screen with ANA. Hepatitis screen was  negative. She denies history of abnormal bone fracture. Patient denies history of recurrent infection or atypical infections such as shingles of meningitis. Denies chills, night sweats or anorexia  In August 2017, she underwent extensive workup which excluded multiple myeloma. X-ray is most consistent with osteoarthritis and she have IgG lambda MGUS with polyclonal IgA  REVIEW OF SYSTEMS:   Constitutional: Denies fevers, chills or abnormal weight loss Eyes: Denies blurriness of vision Ears, nose, mouth, throat, and face: Denies mucositis or sore throat Respiratory: Denies cough, dyspnea or wheezes Cardiovascular: Denies palpitation, chest discomfort or lower extremity swelling Gastrointestinal:  Denies nausea, heartburn or change in bowel habits Skin: Denies abnormal skin rashes Lymphatics: Denies new lymphadenopathy or easy bruising Neurological:Denies numbness, tingling or new weaknesses Behavioral/Psych: Mood is stable, no new changes  All other systems were reviewed with the patient and are negative.  I have reviewed the past medical history, past surgical history, social history and family history with the patient and they are unchanged from previous note.  ALLERGIES:  has No Known Allergies.  MEDICATIONS:  Current Outpatient Medications  Medication Sig Dispense Refill  . amLODipine (NORVASC) 10 MG tablet Take 1 tablet (10 mg total) by mouth daily. 90 tablet 3  . hydrochlorothiazide (HYDRODIURIL) 25 MG tablet Take 25 mg by mouth daily.    . hydroxychloroquine (PLAQUENIL) 200 MG tablet Take 1 tablet (200 mg total) by mouth 2 (two) times daily. 180 tablet 0  . lisinopril (PRINIVIL,ZESTRIL) 40 MG tablet Take 1 tablet (40 mg total) by mouth daily. 90 tablet 3  . meloxicam (MOBIC) 7.5 MG tablet Take 1 tablet (7.5 mg total) by mouth daily. 15 tablet 0  . potassium chloride  SA (KLOR-CON M20) 20 MEQ tablet Klor-Con M20 mEq tablet,extended release    . Vitamin D, Ergocalciferol, (DRISDOL)  50000 units CAPS capsule Take 1 capsule (50,000 Units total) by mouth every 7 (seven) days. (Patient not taking: Reported on 07/25/2018) 4 capsule 0   No current facility-administered medications for this visit.     PHYSICAL EXAMINATION: ECOG PERFORMANCE STATUS: 1 - Symptomatic but completely ambulatory  Vitals:   09/02/18 1341  BP: 132/81  Pulse: 71  Resp: 18  Temp: 98.3 F (36.8 C)  SpO2: 100%   Filed Weights   09/02/18 1341  Weight: 291 lb 12.8 oz (132.4 kg)    GENERAL:alert, no distress and comfortable Musculoskeletal:no cyanosis of digits and no clubbing  NEURO: alert & oriented x 3 with fluent speech, no focal motor/sensory deficits  LABORATORY DATA:  I have reviewed the data as listed    Component Value Date/Time   NA 142 08/26/2018 1309   NA 138 03/17/2018 1138   NA 142 08/05/2017 1057   K 3.7 08/26/2018 1309   K 2.7 (LL) 08/05/2017 1057   CL 108 08/26/2018 1309   CO2 28 08/26/2018 1309   CO2 33 (H) 08/05/2017 1057   GLUCOSE 93 08/26/2018 1309   GLUCOSE 103 08/05/2017 1057   BUN 15 08/26/2018 1309   BUN 12 03/17/2018 1138   BUN 31.2 (H) 08/05/2017 1057   CREATININE 1.07 (H) 08/26/2018 1309   CREATININE 1.8 (H) 08/05/2017 1057   CALCIUM 9.0 08/26/2018 1309   CALCIUM 10.4 08/05/2017 1057   PROT 7.0 08/26/2018 1309   PROT 7.3 03/17/2018 1138   PROT 8.3 08/05/2017 1057   ALBUMIN 3.2 (L) 08/26/2018 1309   ALBUMIN 3.9 03/17/2018 1138   ALBUMIN 3.2 (L) 08/05/2017 1057   AST 18 08/26/2018 1309   AST 18 08/05/2017 1057   ALT 9 08/26/2018 1309   ALT 10 08/05/2017 1057   ALKPHOS 56 08/26/2018 1309   ALKPHOS 62 08/05/2017 1057   BILITOT 0.2 (L) 08/26/2018 1309   BILITOT 0.4 03/17/2018 1138   BILITOT 0.46 08/05/2017 1057   GFRNONAA >60 08/26/2018 1309   GFRNONAA 83 08/29/2015 1457   GFRAA >60 08/26/2018 1309   GFRAA >89 08/29/2015 1457    No results found for: SPEP, UPEP  Lab Results  Component Value Date   WBC 8.3 08/26/2018   NEUTROABS 3.9  08/26/2018   HGB 11.4 (L) 08/26/2018   HCT 33.7 (L) 08/26/2018   MCV 97.6 08/26/2018   PLT 269 08/26/2018      Chemistry      Component Value Date/Time   NA 142 08/26/2018 1309   NA 138 03/17/2018 1138   NA 142 08/05/2017 1057   K 3.7 08/26/2018 1309   K 2.7 (LL) 08/05/2017 1057   CL 108 08/26/2018 1309   CO2 28 08/26/2018 1309   CO2 33 (H) 08/05/2017 1057   BUN 15 08/26/2018 1309   BUN 12 03/17/2018 1138   BUN 31.2 (H) 08/05/2017 1057   CREATININE 1.07 (H) 08/26/2018 1309   CREATININE 1.8 (H) 08/05/2017 1057      Component Value Date/Time   CALCIUM 9.0 08/26/2018 1309   CALCIUM 10.4 08/05/2017 1057   ALKPHOS 56 08/26/2018 1309   ALKPHOS 62 08/05/2017 1057   AST 18 08/26/2018 1309   AST 18 08/05/2017 1057   ALT 9 08/26/2018 1309   ALT 10 08/05/2017 1057   BILITOT 0.2 (L) 08/26/2018 1309   BILITOT 0.4 03/17/2018 1138   BILITOT 0.46 08/05/2017 1057  RADIOGRAPHIC STUDIES: I have personally reviewed the radiological images as listed and agreed with the findings in the report. Dg Chest 1 View  Result Date: 08/06/2018 CLINICAL DATA:  Pos TB blood test / no chest complaints / smoker / jdh 315 EXAM: CHEST  1 VIEW COMPARISON:  none FINDINGS: Lungs are clear. Heart size and mediastinal contours are within normal limits. No effusion. Visualized bones unremarkable.  Cholecystectomy clips. IMPRESSION: No acute cardiopulmonary disease. Electronically Signed   By: Lucrezia Europe M.D.   On: 08/06/2018 11:06    All questions were answered. The patient knows to call the clinic with any problems, questions or concerns. No barriers to learning was detected.  I spent 10 minutes counseling the patient face to face. The total time spent in the appointment was 15 minutes and more than 50% was on counseling and review of test results  Heath Lark, MD 09/02/2018 2:46 PM

## 2018-09-02 NOTE — Telephone Encounter (Signed)
Pt declined avs and calendar.  Will receive update on mychart.

## 2018-09-02 NOTE — Assessment & Plan Note (Signed)
She has history of vitamin D deficiency I recommend oral vitamin D replacement therapy, over-the-counter

## 2018-09-02 NOTE — Assessment & Plan Note (Signed)
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 I discussed with her the natural history of MGUS. Her bone pain is not related to this. Recent blood work showed no significant signs of disease progression She would need close follow-up once a year with history, blood work and examination. She agreed

## 2018-09-30 ENCOUNTER — Other Ambulatory Visit: Payer: Self-pay | Admitting: Internal Medicine

## 2018-09-30 NOTE — Telephone Encounter (Signed)
Requested medication (s) are due for refill today: yes  Requested medication (s) are on the active medication list: yes  Last refill:  Lisinopril 09/06/17  (expired), HCTZ (historical provider), Amlodipine 03/29/17 (expired)  Future visit scheduled: no  Notes to clinic:  Expired     Requested Prescriptions  Pending Prescriptions Disp Refills   amLODipine (NORVASC) 10 MG tablet 90 tablet 3    Sig: Take 1 tablet (10 mg total) by mouth daily.     Cardiovascular:  Calcium Channel Blockers Passed - 09/30/2018  1:08 PM      Passed - Last BP in normal range    BP Readings from Last 1 Encounters:  09/02/18 132/81         Passed - Valid encounter within last 6 months    Recent Outpatient Visits          3 months ago [place], Marvis Repress, [place]   5 months ago [place], [place]   6 months ago    [place], MD   1 year ago Preventative health care   [place], MD   1 year ago Redness of eye, left   [place], MD            hydrochlorothiazide (HYDRODIURIL) 25 MG tablet 90 tablet 0    Sig: Take 1 tablet (25 mg total) by mouth daily.     Cardiovascular: Diuretics - Thiazide Failed - 09/30/2018  1:08 PM      Failed - Cr in normal range and within 360 days    Creatinine  Date Value Ref Range Status  08/05/2017 1.8 (H) 0.6 - 1.1 mg/dL Final   Creatinine, Ser  Date Value Ref Range Status  08/26/2018 1.07 (H) 0.44 - 1.00 mg/dL Final         Passed - Ca in normal range and within 360 days    Calcium  Date Value Ref Range Status  08/26/2018 9.0 8.9 - 10.3 mg/dL Final  08/05/2017 10.4 8.4 - 10.4 mg/dL Final         Passed - K in normal range and within 360 days    Potassium  Date Value Ref Range Status  08/26/2018 3.7 3.5 - 5.1 mmol/L Final   08/05/2017 2.7 (LL) 3.5 - 5.1 mEq/L Final         Passed - Na in normal range and within 360 days    Sodium  Date Value Ref Range Status  08/26/2018 142 135 - 145 mmol/L Final  03/17/2018 138 134 - 144 mmol/L Final  08/05/2017 142 136 - 145 mEq/L Final         Passed - Last BP in normal range    BP Readings from Last 1 Encounters:  09/02/18 132/81         Passed - Valid encounter within last 6 months    Recent Outpatient Visits          3 months ago [place], Marvis Rep[place], [place]   5 months ago [place], Claudina Lick, MD   6 months ago    [place], Claudina Lick, MD   1 year ago Preventative health care   [place]  Binnie Rail, MD   1 year ago Redness of eye, left   [place], MD            lisinopril (PRINIVIL,ZESTRIL) 40 MG tablet 90 tablet 0    Sig: Take 1 tablet (40 mg total) by mouth daily.     Cardiovascular:  ACE Inhibitors Failed - 09/30/2018  1:08 PM      Failed - Cr in normal range and within 180 days    Creatinine  Date Value Ref Range Status  08/05/2017 1.8 (H) 0.6 - 1.1 mg/dL Final   Creatinine, Ser  Date Value Ref Range Status  08/26/2018 1.07 (H) 0.44 - 1.00 mg/dL Final         Passed - K in normal range and within 180 days    Potassium  Date Value Ref Range Status  08/26/2018 3.7 3.5 - 5.1 mmol/L Final  08/05/2017 2.7 (LL) 3.5 - 5.1 mEq/L Final         Passed - Patient is not pregnant      Passed - Last BP in normal range    BP Readings from Last 1 Encounters:  09/02/18 132/81         Passed - Valid encounter within last 6 months    Recent Outpatient Visits          3 months ago [place], Marvis Repress, [place]   5 months ago [place], Claudina Lick, MD   6  months ago    [place], Claudina Lick, MD   1 year ago Preventative health care   [place], Claudina Lick, MD   1 year ago Redness of eye, left   [place], Stacy J, MD

## 2018-09-30 NOTE — Telephone Encounter (Signed)
Copied from [place] [id number]. Topic: Quick Communication - Rx Refill/Question >> Sep 30, 2018 12:53 PM Margot Ables wrote: Medication: amLODipine (NORVASC) 10 MG tablet, hydrochlorothiazide (HYDRODIURIL) 25 MG tablet, lisinopril (PRINIVIL,ZESTRIL) 40 MG tablet - pt is out of medication and has been for 1 week - she changed to a different [facility] and didn't know there were no refills left  Has the patient contacted their pharmacy? Yes - needs New RX Preferred Pharmacy (with phone number or street name): [place], [place] - [address] [phone] (Phone) [phone] (Fax)

## 2018-10-03 MED ORDER — LISINOPRIL 40 MG PO TABS: 40.0000 mg | ORAL_TABLET | Freq: Every day | ORAL | 0 refills | Status: DC

## 2018-10-03 MED ORDER — HYDROCHLOROTHIAZIDE 25 MG PO TABS: 25.0000 mg | ORAL_TABLET | Freq: Every day | ORAL | 0 refills | Status: DC

## 2018-10-03 MED ORDER — AMLODIPINE BESYLATE 10 MG PO TABS: 10.0000 mg | ORAL_TABLET | Freq: Every day | ORAL | 0 refills | Status: DC

## 2018-11-01 NOTE — Progress Notes (Signed)
Office Visit Note  Patient: Alexandra Powers             Date of Birth: Aug 12, 1971           MRN: [id number]             PCP: Binnie Rail, MD Referring: Binnie Rail, MD Visit Date: 11/03/2018 Occupation: @GUAROCC @  Subjective:  Pain in multiple joints   History of Present Illness: Alexandra Powers is a 47 y.o. female with history of rheumatoid arthritis. She has not noticed any benefit since starting on PLQ. She reports pain in bilateral hands, right shoulder, left elbow, bilateral knees, and bilateral feet.  She has sharp pain in the right shoulder joint.  She continues to have pain in both feet but denies any ankle joint pain or joint swelling. She has chronic lower back pain.  She continues to have pain in both knee joints. She is frustrated since she was previously doing water aerobics but unable to participate now due to pain.  She was trying to lose weight to qualify for knee replacement surgery. She went to the health department who confirmed she has latent TB and she completed her course of treatment.  She had an appointment with hematology due to low hemoglobin and diagnosed with MGUS.     Activities of Daily Living:  Patient reports morning stiffness for several hours.   Patient Reports nocturnal pain.  Difficulty dressing/grooming: Reports Difficulty climbing stairs: Reports Difficulty getting out of chair: Denies Difficulty using hands for taps, buttons, cutlery, and/or writing: Reports  Review of Systems  Constitutional: Negative for fatigue.  HENT: Positive for mouth dryness. Negative for mouth sores, trouble swallowing, trouble swallowing and nose dryness.   Eyes: Negative for photophobia, pain, redness, itching, visual disturbance and dryness.  Respiratory: Negative for cough, hemoptysis, shortness of breath, wheezing and difficulty breathing.   Cardiovascular: Negative for chest pain, palpitations, hypertension and swelling in legs/feet.  Gastrointestinal: Negative for  abdominal pain, blood in stool, constipation, diarrhea, nausea and vomiting.  Endocrine: Negative for increased urination.  Genitourinary: Negative for painful urination, nocturia and pelvic pain.  Musculoskeletal: Positive for arthralgias, joint pain, joint swelling and morning stiffness. Negative for myalgias, muscle weakness, muscle tenderness and myalgias.  Skin: Negative for color change, pallor, rash, hair loss, nodules/bumps, skin tightness, ulcers and sensitivity to sunlight.  Allergic/Immunologic: Negative for susceptible to infections.  Neurological: Positive for headaches. Negative for dizziness, light-headedness, numbness, memory loss and weakness.  Hematological: Negative for bruising/bleeding tendency and swollen glands.  Psychiatric/Behavioral: Negative for depressed mood, confusion and sleep disturbance. The patient is not nervous/anxious.     PMFS History:  Patient Active Problem List   Diagnosis Date Noted  . Primary osteoarthritis of both hips 07/29/2018  . Primary osteoarthritis of both knees 07/29/2018  . Primary osteoarthritis of both feet 07/29/2018  . Tonsillitis 05/02/2018  . Hypokalemia 09/03/2017  . Acute prerenal failure ([place]) 09/03/2017  . OSA (obstructive sleep apnea) 12/10/2016  . GERD (gastroesophageal reflux disease) 12/04/2016  . Hypersomnia 10/29/2016  . Back pain 08/21/2016  . Chronic pain of multiple joints 08/13/2016  . MGUS (monoclonal gammopathy of unknown significance) 07/02/2016  . Vitamin D deficiency 07/02/2016  . Diarrhea 07/02/2016  . Joint pain 04/03/2016  . HTN (hypertension), benign 11/11/2015  . Morbid obesity ([place]) 11/11/2015  . Adrenal mass ([place]) 08/30/2015  . Ovarian cyst 08/30/2015    Past Medical History:  Diagnosis Date  . Arthritis   . Gallstones   .  GERD (gastroesophageal reflux disease)   . Hypertension   . Obesity   . Osteoarthritis   . PONV (postoperative nausea and vomiting)   . Sleep apnea    SENT CPAP BACK,  NOT WORN IN 7-8 MONTHS    Family History  Problem Relation Age of Onset  . Arthritis Mother   . High blood pressure Mother   . Sleep apnea Mother   . High blood pressure Father   . Breast cancer Paternal Aunt   . Arthritis Sister   . Healthy Daughter   . Healthy Son   . Adrenal disorder Neg Hx   . Colon cancer Neg Hx   . Esophageal cancer Neg Hx   . Pancreatic cancer Neg Hx   . Stomach cancer Neg Hx   . Liver disease Neg Hx    Past Surgical History:  Procedure Laterality Date  . BREAST DUCTAL SYSTEM EXCISION Bilateral 10/12/2017   Procedure: EXCISION OF BILATERAL CENTRAL DUCT;  Surgeon: Erroll Luna, MD;  Location: [facility];  Service: General;  Laterality: Bilateral;  . BREAST EXCISIONAL BIOPSY    . CHOLECYSTECTOMY N/A 12/21/2015   Procedure: LAPAROSCOPIC CHOLECYSTECTOMY;  Surgeon: Georganna Skeans, MD;  Location: [facility];  Service: General;  Laterality: N/A;  . UMBILICAL HERNIA REPAIR N/A 12/27/2015   Procedure: INCARCERATED UMBILICAL HERNIA REPAIR;  Surgeon: Coralie Keens, MD;  Location: [facility];  Service: General;  Laterality: N/A;   Social History   Social History Narrative   Lives with husband, Berneta Sages.   Immunization History  Administered Date(s) Administered  . Influenza,inj,Quad PF,6+ Mos 08/29/2015, 08/21/2016, 08/31/2017, 08/09/2018  . Influenza,inj,quad, With Preservative 08/30/2017  . Tdap 08/29/2015   Objective: Vital Signs: BP (!) 138/95 (BP Location: Right Arm, Patient Position: Sitting, Cuff Size: Large)   Pulse 69   Resp 15   Ht 5\' 8"  (1.727 m)   Wt 293 lb (132.9 kg)   BMI 44.55 kg/m    Physical Exam  Constitutional: She is oriented to person, place, and time. She appears well-developed and well-nourished.  HENT:  Head: Normocephalic and atraumatic.  Eyes: Conjunctivae and EOM are normal.  Neck: Normal range of motion.  Cardiovascular: Normal rate, regular rhythm, normal heart sounds and intact distal pulses.  Pulmonary/Chest: Effort normal and  breath sounds normal.  Abdominal: Soft. Bowel sounds are normal.  Lymphadenopathy:    She has no cervical adenopathy.  Neurological: She is alert and oriented to person, place, and time.  Skin: Skin is warm and dry. Capillary refill takes less than 2 seconds.  Psychiatric: She has a normal mood and affect. Her behavior is normal.  Nursing note and vitals reviewed.    Musculoskeletal Exam: She has C-spine limited range of motion with lateral rotation to the left.  Thoracic and lumbar spine limited range of motion with some discomfort in the lumbar spine.  Right shoulder discomfort with range of motion.  Left shoulder full ROM with no discomfort.  Elbow joints good range of motion.  She has tenderness and swelling of the left elbow joint.  Tenderness of MCP and PIP joints.  She has tenderness of bilateral wrist joints.  No apparent synovitis noted.  Hip joints, knee joints, ankle joints and MTPs and PIPs and DIPs good range of motion.  She has tenderness of MTP and PIP joints.  Right knee warmth on exam.  No tenderness or swelling of ankle joints.  CDAI Exam: CDAI Score: 17.2  Patient Global Assessment: 6 (mm); Provider Global Assessment: 6 (mm) Swollen: 0 ;  Tender: 26  Joint Exam      Right  Left  Glenohumeral   Tender     Elbow      Tender  Wrist   Tender   Tender  MCP 1   Tender   Tender  MCP 2   Tender   Tender  MCP 3   Tender   Tender  MCP 4   Tender     PIP 2   Tender   Tender  PIP 3   Tender   Tender  Knee   Tender     MTP 1   Tender   Tender  MTP 2   Tender   Tender  MTP 3   Tender   Tender  MTP 4   Tender   Tender  MTP 5   Tender   Tender     Investigation: No additional findings.  Imaging: No results found.  Recent Labs: Lab Results  Component Value Date   WBC 8.3 08/26/2018   HGB 11.4 (L) 08/26/2018   PLT 269 08/26/2018   NA 142 08/26/2018   K 3.7 08/26/2018   CL 108 08/26/2018   CO2 28 08/26/2018   GLUCOSE 93 08/26/2018   BUN 15 08/26/2018   CREATININE  1.07 (H) 08/26/2018   BILITOT 0.2 (L) 08/26/2018   ALKPHOS 56 08/26/2018   AST 18 08/26/2018   ALT 9 08/26/2018   PROT 7.0 08/26/2018   ALBUMIN 3.2 (L) 08/26/2018   CALCIUM 9.0 08/26/2018   GFRAA >60 08/26/2018   QFTBGOLDPLUS POSITIVE (A) 07/29/2018    Speciality Comments: PLQ Eye Exam: 08/12/18 WNL @ [facility] Follow up in 1 year.   Procedures:  No procedures performed Allergies: Patient has no known allergies.     Assessment / Plan:     Visit Diagnoses: Rheumatoid arthritis involving multiple sites with positive rheumatoid factor (HCC) - Positive RF, positive anti-CCP, positive elevated sedimentation rate, positive synovitis on ultrasound: She has no obvious synovitis on exam.  She is having pain in multiple joints including both hands, left elbow joints, right shoulder joint, both knee joints, and both feet.  She did not notice improvement since starting on PLQ on 07/29/18.   High risk medication use - PLQ 200 mg BID.  She started on 07/29/18. eye exam: 08/12/2018 - Most recent CBC/CMP showed decrease in hemoglobin/hematocrit and increase in serum creatinine on 08/26/2018. She was evaluated by hematologist and was diagnosed with MGUS.  Standing orders placed.   Patient received her flu shot in September. 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   Primary osteoarthritis of both hips: She has no discomfort at this time.  She states that occasionally she has been increased discomfort if she is walking for prolonged distances  Primary osteoarthritis of both knees: Chronic pain.  She has mild warmth in the right knee joint.  She is been having significant discomfort in bilateral knee joints.  She was previously doing water aerobics but is unable to do the pain she has been experiencing.  She has been trying to lose weight  so she can have knee replacement surgery.  Primary osteoarthritis of both feet: She has chronic pain in both feet.  No synovitis noted.  We discussed the importance of wearing proper fitting shoes.   Other medical conditions are listed as follows:   HTN (hypertension), benign  OSA (obstructive sleep apnea)  Vitamin D deficiency  MGUS (monoclonal gammopathy of unknown significance)  Gastroesophageal reflux disease, esophagitis presence not specified   Orders: Orders Placed This Encounter  Procedures  . CBC with Differential/Platelet  . COMPLETE METABOLIC PANEL WITH GFR   Meds ordered this encounter  Medications  . methotrexate (RHEUMATREX) 2.5 MG tablet    Sig: Take 6 tablets (15 mg total) by mouth once a week. Caution:Chemotherapy. Protect from light.    Dispense:  72 tablet    Refill:  0    Face-to-face time spent with patient was 30 minutes. Greater than 50% of time was spent in counseling and coordination of care.  Follow-Up Instructions: Return in about 3 months (around 02/02/2019) for Rheumatoid arthritis.   Hazel Sams, PA-C  I examined and evaluated the patient with Hazel Sams PA.  Patient complains of ongoing pain and discomfort in her joints.  She states she has intermittent swelling in her left wrist.  Mild flexor tenosynovitis was noted.  She also had tenderness over MCPs and PIPs on my examination.  No obvious synovitis was noted.  She had no response to Plaquenil.  Different treatment options were discussed.  After reviewing indication side effects contraindications of methotrexate we decided to proceed with methotrexate.  She will start with 6 tablets p.o. weekly along with folic acid 2 tablets p.o. daily.  Please p.o. weekly.  Standing orders were given to check labs every 2 weeks x 2 and then every 2 months to monitor for drug toxicity.  Discontinue Plaquenil.  The plan of care was discussed as noted above.  Bo Merino, MD  Note - This record has been  created using Editor, commissioning.  Chart creation errors have been sought, but may not always  have been located. Such creation errors do not reflect on  the standard of medical care.

## 2018-11-03 ENCOUNTER — Ambulatory Visit (INDEPENDENT_AMBULATORY_CARE_PROVIDER_SITE_OTHER): Payer: BLUE CROSS/BLUE SHIELD | Admitting: Rheumatology

## 2018-11-03 ENCOUNTER — Encounter: Payer: Self-pay | Admitting: Rheumatology

## 2018-11-03 VITALS — BP 138/95 | HR 69 | Resp 15 | Ht 68.0 in | Wt 293.0 lb

## 2018-11-03 DIAGNOSIS — K219 Gastro-esophageal reflux disease without esophagitis: Secondary | ICD-10-CM

## 2018-11-03 DIAGNOSIS — Z79899 Other long term (current) drug therapy: Secondary | ICD-10-CM | POA: Diagnosis not present

## 2018-11-03 DIAGNOSIS — D472 Monoclonal gammopathy: Secondary | ICD-10-CM

## 2018-11-03 DIAGNOSIS — M0579 Rheumatoid arthritis with rheumatoid factor of multiple sites without organ or systems involvement: Secondary | ICD-10-CM | POA: Diagnosis not present

## 2018-11-03 DIAGNOSIS — M160 Bilateral primary osteoarthritis of hip: Secondary | ICD-10-CM

## 2018-11-03 DIAGNOSIS — M19072 Primary osteoarthritis, left ankle and foot: Secondary | ICD-10-CM

## 2018-11-03 DIAGNOSIS — I10 Essential (primary) hypertension: Secondary | ICD-10-CM

## 2018-11-03 DIAGNOSIS — E559 Vitamin D deficiency, unspecified: Secondary | ICD-10-CM

## 2018-11-03 DIAGNOSIS — R7612 Nonspecific reaction to cell mediated immunity measurement of gamma interferon antigen response without active tuberculosis: Secondary | ICD-10-CM | POA: Diagnosis not present

## 2018-11-03 DIAGNOSIS — M170 Bilateral primary osteoarthritis of knee: Secondary | ICD-10-CM

## 2018-11-03 DIAGNOSIS — G4733 Obstructive sleep apnea (adult) (pediatric): Secondary | ICD-10-CM

## 2018-11-03 DIAGNOSIS — M19071 Primary osteoarthritis, right ankle and foot: Secondary | ICD-10-CM

## 2018-11-03 MED ORDER — METHOTREXATE 2.5 MG PO TABS: 15.0000 mg | ORAL_TABLET | ORAL | 0 refills | Status: DC

## 2018-11-03 NOTE — Patient Instructions (Signed)
Standing Labs We placed an order today for your standing lab work.    Please come back and get your standing labs in 2 weeks, then 2 weeks, and then every 2 months.  We have open lab Monday through Friday from 8:30-11:30 AM and 1:30-4:00 PM  at the office of Dr. Bo Merino.   You may experience shorter wait times on Monday and Friday afternoons. The office is located at [address] [place], [place], [place] [address] No appointment is necessary.   Labs are drawn by [facility].  You may receive a bill from [facility] for your lab work. If you have any questions regarding directions or hours of operation,  please call [phone].   Just as a reminder please drink plenty of water prior to coming for your lab work. Thanks!

## 2018-11-04 ENCOUNTER — Telehealth: Payer: Self-pay | Admitting: Rheumatology

## 2018-11-04 DIAGNOSIS — M0579 Rheumatoid arthritis with rheumatoid factor of multiple sites without organ or systems involvement: Secondary | ICD-10-CM

## 2018-11-04 MED ORDER — FOLIC ACID 1 MG PO TABS: 2.0000 mg | ORAL_TABLET | Freq: Every day | ORAL | 3 refills | Status: DC

## 2018-11-04 NOTE — Telephone Encounter (Signed)
Prescription sent to pharmacy.  Called to inform patient.

## 2018-11-04 NOTE — Telephone Encounter (Signed)
Patient picked up rx for MTX. Patient was under the impression she was to take a vitamin with MTX, but a vitamin was not with the MTX. Please call patient to inform. Patient uses Paediatric nurse on [facility].

## 2018-11-26 IMAGING — MG DIGITAL DIAGNOSTIC UNILATERAL RIGHT MAMMOGRAM WITH TOMO AND CAD
8 of 14 series · 8 of 40 positions shown · non-contrast
Comparison: Previous exam(s).

ACR Breast Density Category a: The breast tissue is almost entirely
fatty.

CLINICAL DATA: 46-year-old female presenting evaluation of a
previously painful erythematous lump adjacent to the nipple in the
right breast. She had an dental issue after this occurred and was
placed on a course of antibiotics. The lump resolved with the
antibiotics. She has had surgery at this site on the right breast
previously for a epidermal inclusion cyst.

EXAM:
DIGITAL DIAGNOSTIC UNILATERAL RIGHT MAMMOGRAM WITH CAD AND TOMO

[R MLO synth-2D (1 of 4)]
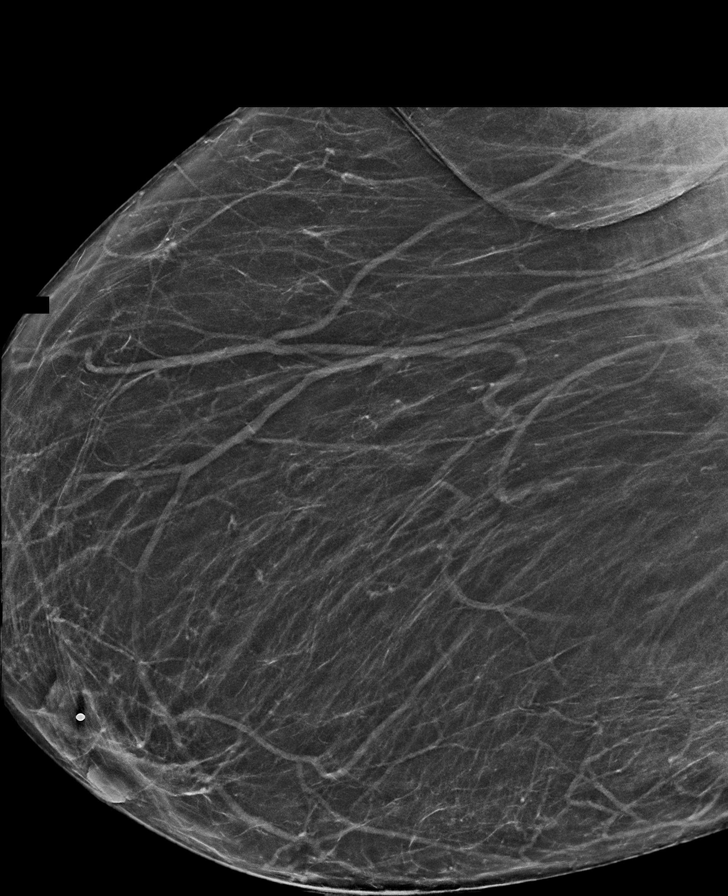

[R MLO synth-2D (2 of 4)]
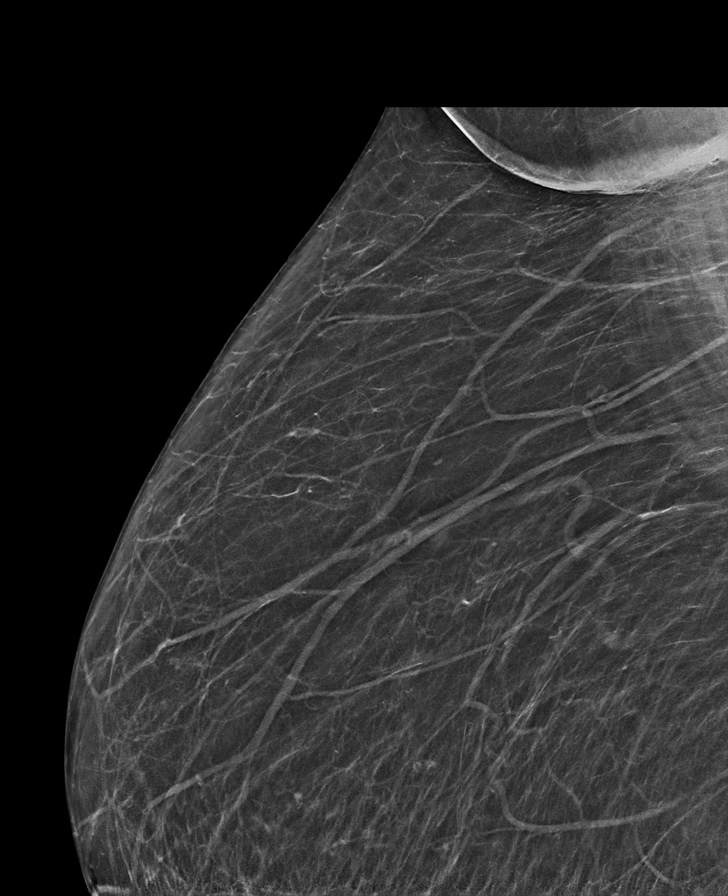

[R CC synth-2D (1 of 2)]
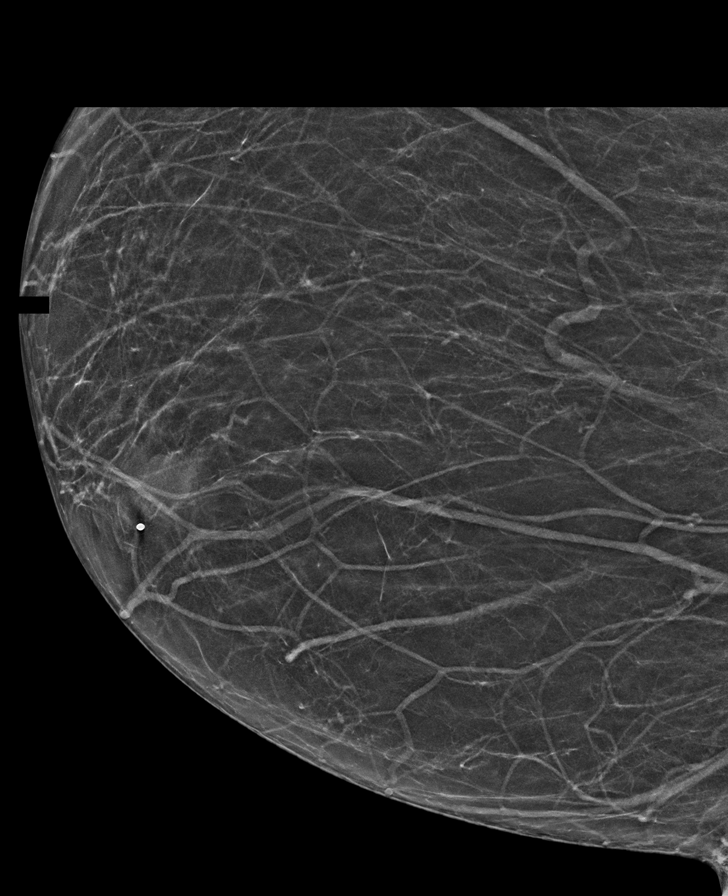

[R TAN synth-2D]
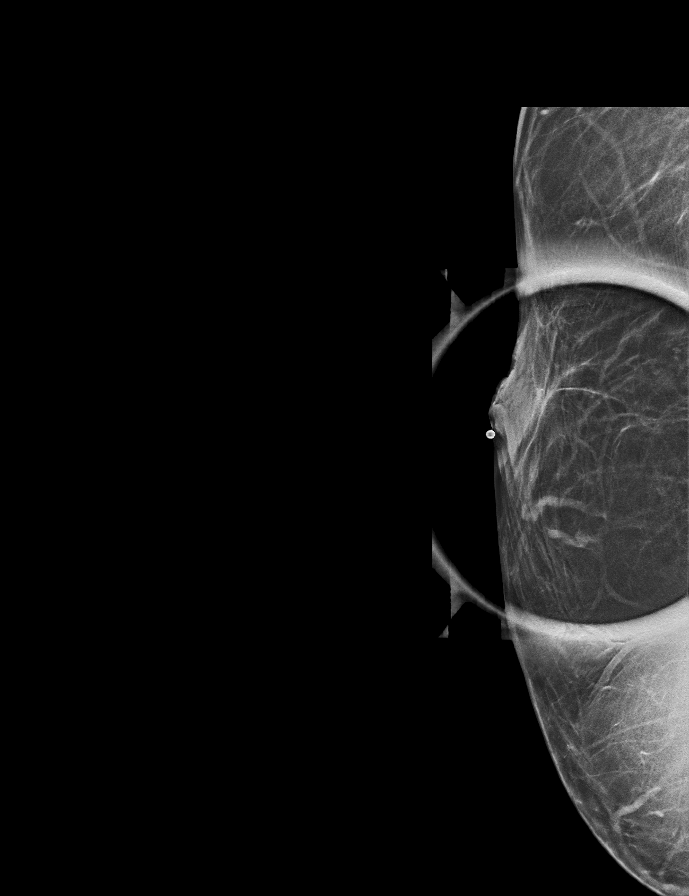

[R CC synth-2D (2 of 2)]
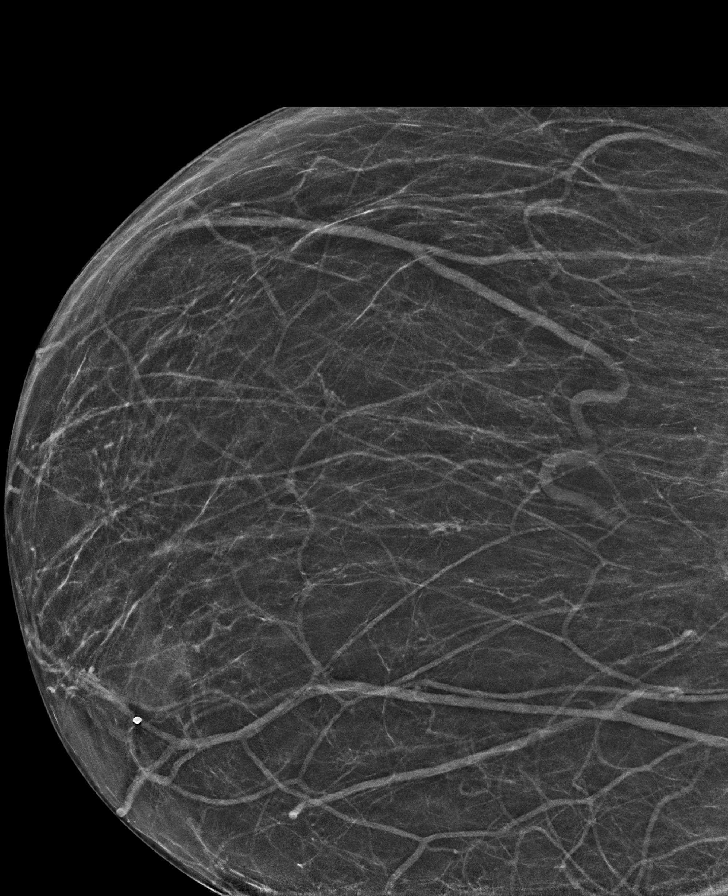

[R MLO synth-2D (3 of 4)]
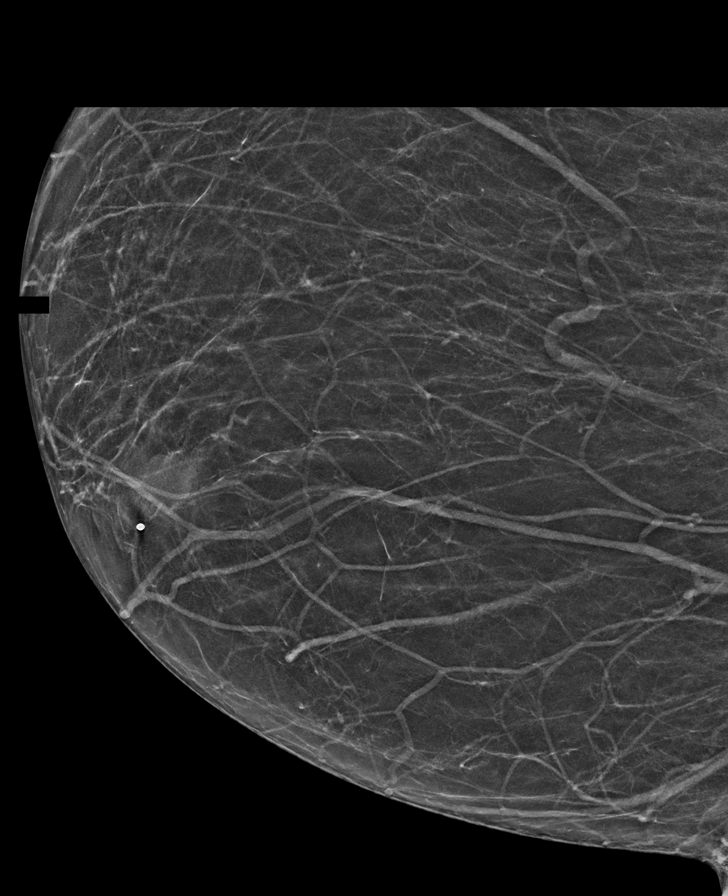

[R MLO synth-2D (4 of 4)]
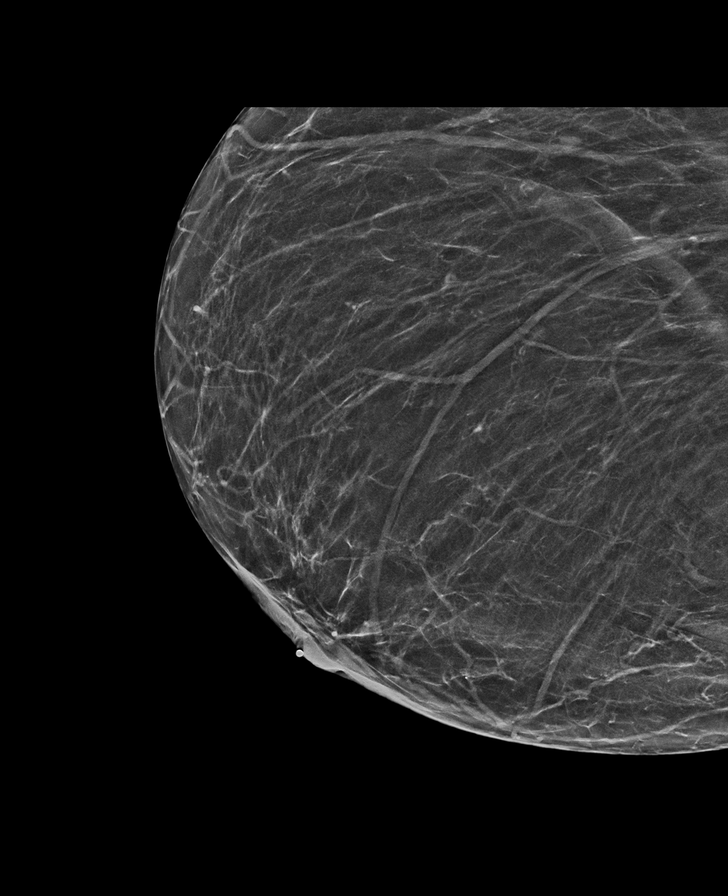

[R MLO tomo · tomo slice 29/56.0]
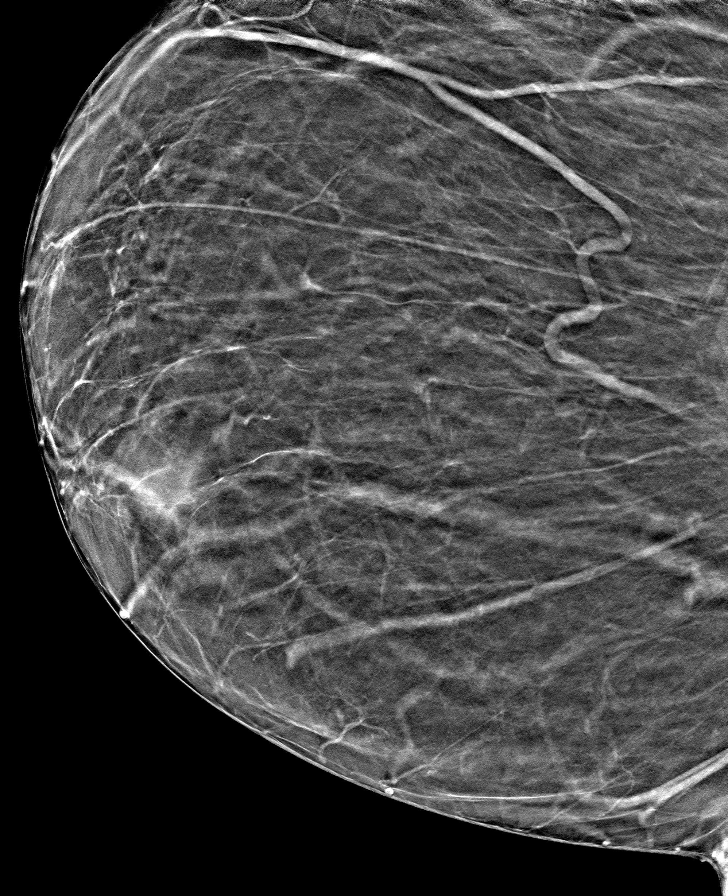

[8 of 40 positions shown; findings below may reference images not displayed]

FINDINGS: No suspicious calcifications, masses or areas of distortion are seen
in the right breast. No residual abnormality is seen adjacent to the
nipple.

Mammographic images were processed with CAD.
IMPRESSION: 1. No evidence of right breast malignancy. No abnormal findings she
sent to the nipple at the site of the previously tender lump
indicated by the patient.

RECOMMENDATION:
Return to routine screening mammography is recommended. The patient
will be due for screening in Tuesday July, 2018.

I have discussed the findings and recommendations with the patient.
Results were also provided in writing at the conclusion of the
visit. If applicable, a reminder letter will be sent to the patient
regarding the next appointment.

BI-RADS CATEGORY  1: Negative.

## 2018-12-08 NOTE — Progress Notes (Signed)
Subjective:    [name], female    DOB: 02-14-1971, 48 y.o.   MRN: [id number]  HPI The patient is here for an acute visit.  Joint pain:  She has wrist pain, finger, shoulders, elbows, hip, knee, feet and back.  She has diffuse joint pain.  The hands and back hurt the most.  She has nerve ablation for the back and that helps.  She can not have knee surgery until she loses weight.  She is following with Dr Estanislado Pandy.  She has RA and likely OA.    She was on the plaquenil x 3 months and that did not help.  She was put on methotrexate for the past month and has not seen a difference.  She has tried tylenol and advil 800 mg and they do not help at all.  She has f/u with Dr Estanislado Pandy.    Obesity: she is exercising minimally due to her joint pain.  She will try to get back to water exercises.      saxenda worked well for her.  She initially has some nausea.  She needs something to help her lose the weight.  She needs to at least have her knees replaced.  She is trying to eat healthy.    Medications and allergies reviewed with patient and updated if appropriate.  Patient Active Problem List   Diagnosis Date Noted  . Primary osteoarthritis of both hips 07/29/2018  . Primary osteoarthritis of both knees 07/29/2018  . Primary osteoarthritis of both feet 07/29/2018  . Tonsillitis 05/02/2018  . Hypokalemia 09/03/2017  . Acute prerenal failure ([place]) 09/03/2017  . OSA (obstructive sleep apnea) 12/10/2016  . GERD (gastroesophageal reflux disease) 12/04/2016  . Hypersomnia 10/29/2016  . Back pain 08/21/2016  . Chronic pain of multiple joints 08/13/2016  . MGUS (monoclonal gammopathy of unknown significance) 07/02/2016  . Vitamin D deficiency 07/02/2016  . Diarrhea 07/02/2016  . Joint pain 04/03/2016  . HTN (hypertension), benign 11/11/2015  . Morbid obesity ([place]) 11/11/2015  . Adrenal mass ([place]) 08/30/2015  . Ovarian cyst 08/30/2015    Current Outpatient Medications on File  Prior to Visit  Medication Sig Dispense Refill  . amLODipine (NORVASC) 10 MG tablet Take 1 tablet (10 mg total) by mouth daily. Needs office visit for more refills 90 tablet 0  . folic acid (FOLVITE) 1 MG tablet Take 2 tablets (2 mg total) by mouth daily. 180 tablet 3  . hydrochlorothiazide (HYDRODIURIL) 25 MG tablet Take 1 tablet (25 mg total) by mouth daily. Needs office visit for more refills 90 tablet 0  . lisinopril (PRINIVIL,ZESTRIL) 40 MG tablet Take 1 tablet (40 mg total) by mouth daily. Needs OV for more refills 90 tablet 0  . methotrexate (RHEUMATREX) 2.5 MG tablet Take 6 tablets (15 mg total) by mouth once a week. Caution:Chemotherapy. Protect from light. 72 tablet 0   No current facility-administered medications on file prior to visit.     Past Medical History:  Diagnosis Date  . Arthritis   . Gallstones   . GERD (gastroesophageal reflux disease)   . Hypertension   . Obesity   . Osteoarthritis   . PONV (postoperative nausea and vomiting)   . Sleep apnea    SENT CPAP BACK, NOT WORN IN 7-8 MONTHS    Past Surgical History:  Procedure Laterality Date  . BREAST DUCTAL SYSTEM EXCISION Bilateral 10/12/2017   Procedure: EXCISION OF BILATERAL CENTRAL DUCT;  Surgeon: Erroll Luna, MD;  Location: [facility];  Service: General;  Laterality: Bilateral;  . BREAST EXCISIONAL BIOPSY    . CHOLECYSTECTOMY N/A 12/21/2015   Procedure: LAPAROSCOPIC CHOLECYSTECTOMY;  Surgeon: Georganna Skeans, MD;  Location: [facility];  Service: General;  Laterality: N/A;  . UMBILICAL HERNIA REPAIR N/A 12/27/2015   Procedure: INCARCERATED UMBILICAL HERNIA REPAIR;  Surgeon: Coralie Keens, MD;  Location: [facility];  Service: General;  Laterality: N/A;    Social History   Socioeconomic History  . Marital status: Married    Spouse name: Redith Drach  . Number of children: 2  . Years of education: Not on file  . Highest education level: Not on file  Occupational History  . Occupation: [employer] at [employer]:  unemploed  Social Needs  . Financial resource strain: Not on file  . Food insecurity:    Worry: Not on file    Inability: Not on file  . Transportation needs:    Medical: Not on file    Non-medical: Not on file  Tobacco Use  . Smoking status: Current Every Day Smoker    Packs/day: 0.50    Years: 24.00    Pack years: 12.00    Types: Cigarettes  . Smokeless tobacco: Never Used  Substance and Sexual Activity  . Alcohol use: Yes    Alcohol/week: 4.0 standard drinks    Types: 4 Shots of liquor per week    Comment: per week  . Drug use: Yes    Types: Marijuana  . Sexual activity: Yes    Partners: Male    Birth control/protection: None  Lifestyle  . Physical activity:    Days per week: Not on file    Minutes per session: Not on file  . Stress: Not on file  Relationships  . Social connections:    Talks on phone: Not on file    Gets together: Not on file    Attends religious service: Not on file    Active member of club or organization: Not on file    Attends meetings of clubs or organizations: Not on file    Relationship status: Not on file  Other Topics Concern  . Not on file  Social History Narrative   Lives with husband, Berneta Sages.    Family History  Problem Relation Age of Onset  . Arthritis Mother   . High blood pressure Mother   . Sleep apnea Mother   . High blood pressure Father   . Breast cancer Paternal Aunt   . Arthritis Sister   . Healthy Daughter   . Healthy Son   . Adrenal disorder Neg Hx   . Colon cancer Neg Hx   . Esophageal cancer Neg Hx   . Pancreatic cancer Neg Hx   . Stomach cancer Neg Hx   . Liver disease Neg Hx     Review of Systems  Constitutional: Negative for fever.  Respiratory: Negative for cough, shortness of breath and wheezing.   Cardiovascular: Negative for chest pain, palpitations and leg swelling.  Musculoskeletal: Positive for arthralgias and back pain.  Neurological: Negative for light-headedness and headaches.        Objective:   Vitals:   12/09/18 1507  BP: 126/78  Pulse: 86  Resp: 18  Temp: 99 F (37.2 C)  SpO2: 98%   BP Readings from Last 3 Encounters:  12/09/18 126/78  11/03/18 (!) 138/95  09/02/18 132/81   Wt Readings from Last 3 Encounters:  12/09/18 295 lb (133.8 kg)  11/03/18 293 lb (  132.9 kg)  09/02/18 291 lb 12.8 oz (132.4 kg)   Body mass index is 44.85 kg/m.   Physical Exam    Constitutional: Appears well-developed and well-nourished. No distress.  HENT:  Head: Normocephalic and atraumatic.  Neck: Neck supple. No tracheal deviation present. No thyromegaly present.  No cervical lymphadenopathy Cardiovascular: Normal rate, regular rhythm and normal heart sounds.   No murmur heard. No carotid bruit .  No edema Pulmonary/Chest: Effort normal and breath sounds normal. No respiratory distress. No has no wheezes. No rales.  Skin: Skin is warm and dry. Not diaphoretic.  Psychiatric: Normal mood and affect. Behavior is normal.       Assessment & Plan:    See Problem List for Assessment and Plan of chronic medical problems.

## 2018-12-09 ENCOUNTER — Ambulatory Visit: Payer: BLUE CROSS/BLUE SHIELD | Admitting: Internal Medicine

## 2018-12-09 ENCOUNTER — Encounter: Payer: Self-pay | Admitting: Internal Medicine

## 2018-12-09 DIAGNOSIS — M2550 Pain in unspecified joint: Secondary | ICD-10-CM

## 2018-12-09 DIAGNOSIS — M25561 Pain in right knee: Secondary | ICD-10-CM | POA: Diagnosis not present

## 2018-12-09 DIAGNOSIS — M1711 Unilateral primary osteoarthritis, right knee: Secondary | ICD-10-CM | POA: Diagnosis not present

## 2018-12-09 DIAGNOSIS — M1712 Unilateral primary osteoarthritis, left knee: Secondary | ICD-10-CM | POA: Diagnosis not present

## 2018-12-09 DIAGNOSIS — M25562 Pain in left knee: Secondary | ICD-10-CM | POA: Diagnosis not present

## 2018-12-09 MED ORDER — TIZANIDINE HCL 6 MG PO CAPS: 6.0000 mg | ORAL_CAPSULE | Freq: Three times a day (TID) | ORAL | 5 refills | Status: DC | PRN

## 2018-12-09 MED ORDER — LIRAGLUTIDE -WEIGHT MANAGEMENT 18 MG/3ML ~~LOC~~ SOPN: 0.6000 mg | PEN_INJECTOR | Freq: Every morning | SUBCUTANEOUS | 5 refills | Status: AC

## 2018-12-09 NOTE — Patient Instructions (Signed)
   Medications reviewed and updated.  Changes include :   Restart saxenda  Your prescription(s) have been submitted to your pharmacy. Please take as directed and contact our office if you believe you are having problem(s) with the medication(s).    Please followup in 3 months

## 2018-12-10 NOTE — Assessment & Plan Note (Signed)
Will try saxenda again -- if not covered or not effective may try phentermine for 3 months Needs to lose with weight to be able to have her knees replaced and will likely help with all her joint pain saxenda sent to [facility] Restart water exercises Decrease portions, keep sugars/carbs to a minimum

## 2018-12-10 NOTE — Assessment & Plan Note (Addendum)
Likely combination of RA and OA pain Following with rheumatology - placed on methotrexate -waiting to see if this will be effective, plaquenil not effective  Has follow up with rheum Reviewed her joint pain - I do not want to start narcotics- continue tylenol - avoid too many nsaids Working on weight loss

## 2018-12-19 NOTE — Progress Notes (Deleted)
48 y.o. G2P0202 Married Black or [place] American Not Hispanic or Latino female here for annual exam.      No LMP recorded. Patient is perimenopausal.          Sexually active: {yes no:314532}  The current method of family planning is {contraception:315051}.    Exercising: {yes no:314532}  {types:19826} Smoker:  {YES [id number]  Health Maintenance: Pap: 11/17/2017 WNL, positive HPV, neg 16/18/45 History of abnormal Pap:  Yes MMG: 04/01/2018 Birads 1 negative Colonoscopy:  Never BMD:   Never TDaP:  08-26-15 Gardasil: N/A   reports that she has been smoking cigarettes. She has a 12.00 pack-year smoking history. She has never used smokeless tobacco. She reports current alcohol use of about 4.0 standard drinks of alcohol per week. She reports current drug use. Drug: Marijuana.  Past Medical History:  Diagnosis Date  . Arthritis   . Gallstones   . GERD (gastroesophageal reflux disease)   . Hypertension   . Obesity   . Osteoarthritis   . PONV (postoperative nausea and vomiting)   . Sleep apnea    SENT CPAP BACK, NOT WORN IN 7-8 MONTHS    Past Surgical History:  Procedure Laterality Date  . BREAST DUCTAL SYSTEM EXCISION Bilateral 10/12/2017   Procedure: EXCISION OF BILATERAL CENTRAL DUCT;  Surgeon: Erroll Luna, MD;  Location: [facility];  Service: General;  Laterality: Bilateral;  . BREAST EXCISIONAL BIOPSY    . CHOLECYSTECTOMY N/A 12/21/2015   Procedure: LAPAROSCOPIC CHOLECYSTECTOMY;  Surgeon: Georganna Skeans, MD;  Location: [facility];  Service: General;  Laterality: N/A;  . UMBILICAL HERNIA REPAIR N/A 12/27/2015   Procedure: INCARCERATED UMBILICAL HERNIA REPAIR;  Surgeon: Coralie Keens, MD;  Location: [facility];  Service: General;  Laterality: N/A;    Current Outpatient Medications  Medication Sig Dispense Refill  . amLODipine (NORVASC) 10 MG tablet Take 1 tablet (10 mg total) by mouth daily. Needs office visit for more refills 90 tablet 0  . folic acid (FOLVITE) 1 MG tablet Take 2  tablets (2 mg total) by mouth daily. 180 tablet 3  . hydrochlorothiazide (HYDRODIURIL) 25 MG tablet Take 1 tablet (25 mg total) by mouth daily. Needs office visit for more refills 90 tablet 0  . lisinopril (PRINIVIL,ZESTRIL) 40 MG tablet Take 1 tablet (40 mg total) by mouth daily. Needs OV for more refills 90 tablet 0  . methotrexate (RHEUMATREX) 2.5 MG tablet Take 6 tablets (15 mg total) by mouth once a week. Caution:Chemotherapy. Protect from light. 72 tablet 0  . tizanidine (ZANAFLEX) 6 MG capsule Take 1 capsule (6 mg total) by mouth 3 (three) times daily as needed for muscle spasms. 90 capsule 5   No current facility-administered medications for this visit.     Family History  Problem Relation Age of Onset  . Arthritis Mother   . High blood pressure Mother   . Sleep apnea Mother   . High blood pressure Father   . Breast cancer Paternal Aunt   . Arthritis Sister   . Healthy Daughter   . Healthy Son   . Adrenal disorder Neg Hx   . Colon cancer Neg Hx   . Esophageal cancer Neg Hx   . Pancreatic cancer Neg Hx   . Stomach cancer Neg Hx   . Liver disease Neg Hx     Review of Systems  Exam:   There were no vitals taken for this visit.  Weight change: @WEIGHTCHANGE @ Height:      Ht Readings from Last 3 Encounters:  12/09/18 5\' 8"  (1.727 m)  11/03/18 5\' 8"  (1.727 m)  09/02/18 5\' 8"  (1.727 m)    General appearance: alert, cooperative and appears stated age Head: Normocephalic, without obvious abnormality, atraumatic Neck: no adenopathy, supple, symmetrical, trachea midline and thyroid {CHL AMB PHY EX THYROID NORM [phone]::"normal to inspection and palpation"} Lungs: clear to auscultation bilaterally Cardiovascular: regular rate and rhythm Breasts: {Exam; breast:13139::"normal appearance, no masses or tenderness"} Abdomen: soft, non-tender; non distended,  no masses,  no organomegaly Extremities: extremities normal, atraumatic, no cyanosis or edema Skin: Skin color,  texture, turgor normal. No rashes or lesions Lymph nodes: Cervical, supraclavicular, and axillary nodes normal. No abnormal inguinal nodes palpated Neurologic: Grossly normal   Pelvic: External genitalia:  no lesions              Urethra:  normal appearing urethra with no masses, tenderness or lesions              Bartholins and Skenes: normal                 Vagina: normal appearing vagina with normal color and discharge, no lesions              Cervix: {CHL AMB PHY EX CERVIX NORM DEFAULT:[phone]::"no lesions"}               Bimanual Exam:  Uterus:  {CHL AMB PHY EX UTERUS NORM [phone]::"normal size, contour, position, consistency, mobility, non-tender"}              Adnexa: {CHL AMB PHY EX ADNEXA NO MASS DEFAULT:[phone]::"no mass, fullness, tenderness"}               Rectovaginal: Confirms               Anus:  normal sphincter tone, no lesions  Chaperone was present for exam.  A:  Well Woman with normal exam  P:

## 2018-12-21 ENCOUNTER — Ambulatory Visit: Payer: BLUE CROSS/BLUE SHIELD | Admitting: Obstetrics and Gynecology

## 2018-12-26 ENCOUNTER — Telehealth: Payer: Self-pay | Admitting: Internal Medicine

## 2018-12-26 NOTE — Telephone Encounter (Signed)
Copied from [facility] [id number]. Topic: General - Other >> Dec 26, 2018 10:05 AM Yvette Rack wrote: Reason for CRM: Pt stated she was told that if it took to long for the [facility] then an alternate medication could be called in. Pt requests alternate medication. Pt request Rx to go to [place], [place] - [address] Cb# [phone]

## 2018-12-27 DIAGNOSIS — M47817 Spondylosis without myelopathy or radiculopathy, lumbosacral region: Secondary | ICD-10-CM | POA: Diagnosis not present

## 2018-12-27 MED ORDER — PHENTERMINE HCL 37.5 MG PO CAPS: 37.5000 mg | ORAL_CAPSULE | ORAL | 0 refills | Status: DC

## 2018-12-27 NOTE — Telephone Encounter (Signed)
Start phentermine x 3 months - have her f/u with me in 3 weeks to check BP here and follow up.  If she can monitor BP it will help.

## 2018-12-27 NOTE — Telephone Encounter (Signed)
PA for saxenda initiated.   KEY: [id number]

## 2018-12-28 NOTE — Telephone Encounter (Signed)
Pt aware. Appointment scheduled for follow up on phentermine

## 2019-01-02 ENCOUNTER — Ambulatory Visit: Payer: BLUE CROSS/BLUE SHIELD | Admitting: Obstetrics and Gynecology

## 2019-01-02 ENCOUNTER — Other Ambulatory Visit: Payer: Self-pay

## 2019-01-02 ENCOUNTER — Encounter: Payer: Self-pay | Admitting: Obstetrics and Gynecology

## 2019-01-02 VITALS — BP 138/84 | HR 72 | Ht 69.69 in | Wt 287.0 lb

## 2019-01-02 DIAGNOSIS — Z1211 Encounter for screening for malignant neoplasm of colon: Secondary | ICD-10-CM | POA: Diagnosis not present

## 2019-01-02 DIAGNOSIS — Z124 Encounter for screening for malignant neoplasm of cervix: Secondary | ICD-10-CM

## 2019-01-02 DIAGNOSIS — Z01419 Encounter for gynecological examination (general) (routine) without abnormal findings: Secondary | ICD-10-CM | POA: Diagnosis not present

## 2019-01-02 DIAGNOSIS — E049 Nontoxic goiter, unspecified: Secondary | ICD-10-CM

## 2019-01-02 DIAGNOSIS — N951 Menopausal and female climacteric states: Secondary | ICD-10-CM

## 2019-01-02 MED ORDER — GABAPENTIN 100 MG PO CAPS: ORAL_CAPSULE | ORAL | 1 refills | Status: DC

## 2019-01-02 NOTE — Addendum Note (Signed)
Addended by: Michele Mcalpine on: 01/02/2019 09:29 AM   Modules accepted: Orders

## 2019-01-02 NOTE — Progress Notes (Signed)
48 y.o. G2P0202 Married Black or [place] American Not Hispanic or Latino female here for annual exam.  No bleeding for a year. She is having several hot flashes a day. Up at least 3 x a night with sweats. Has trouble going back to sleep.  Sexually active, no pain. No bleeding. No bowel or bladder issues.    LMP 1/19, Postmenopausal      Sexually active: Yes.    The current method of family planning is none.    Exercising: No.  The patient does not participate in regular exercise at present. Smoker:  Yes, 2 cigarettes a day, cutting back.   Health Maintenance: Pap:  11/17/2017 normal with positive HPV, negative 16/18/45 History of abnormal Pap:  yes MMG:  04/01/2018 Birads 1 negative, patient is due for bilateral screening Colonoscopy:  Never BMD:   Never TDaP:  08-26-15 Gardasil: N/A   reports that she has been smoking cigarettes. She has a 6.00 pack-year smoking history. She has never used smokeless tobacco. She reports current alcohol use of about 2.0 standard drinks of alcohol per week. She reports current drug use. Drug: Marijuana. She applied for disability was improved, but hasn't gotten it yet. She has osteoarthritis and rheumatoid arthritis.  She needs double knee replacement, can't get the replacement until she looses weight.  Getting radiofrequency ablation of nerves in her back.  2 grown kids and 2 grand kids.   Past Medical History:  Diagnosis Date  . Arthritis   . Gallstones   . GERD (gastroesophageal reflux disease)   . Hypertension   . MGUS (monoclonal gammopathy of unknown significance)   . Obesity   . Osteoarthritis   . PONV (postoperative nausea and vomiting)   . Rheumatoid arthritis ([place])   . Sleep apnea    SENT CPAP BACK, NOT WORN IN 7-8 MONTHS    Past Surgical History:  Procedure Laterality Date  . BREAST DUCTAL SYSTEM EXCISION Bilateral 10/12/2017   Procedure: EXCISION OF BILATERAL CENTRAL DUCT;  Surgeon: Erroll Luna, MD;  Location: [facility];  Service:  General;  Laterality: Bilateral;  . BREAST EXCISIONAL BIOPSY    . CHOLECYSTECTOMY N/A 12/21/2015   Procedure: LAPAROSCOPIC CHOLECYSTECTOMY;  Surgeon: Georganna Skeans, MD;  Location: [facility];  Service: General;  Laterality: N/A;  . UMBILICAL HERNIA REPAIR N/A 12/27/2015   Procedure: INCARCERATED UMBILICAL HERNIA REPAIR;  Surgeon: Coralie Keens, MD;  Location: [facility];  Service: General;  Laterality: N/A;    Current Outpatient Medications  Medication Sig Dispense Refill  . amLODipine (NORVASC) 10 MG tablet Take 1 tablet (10 mg total) by mouth daily. Needs office visit for more refills 90 tablet 0  . folic acid (FOLVITE) 1 MG tablet Take 2 tablets (2 mg total) by mouth daily. 180 tablet 3  . hydrochlorothiazide (HYDRODIURIL) 25 MG tablet Take 1 tablet (25 mg total) by mouth daily. Needs office visit for more refills 90 tablet 0  . lisinopril (PRINIVIL,ZESTRIL) 40 MG tablet Take 1 tablet (40 mg total) by mouth daily. Needs OV for more refills 90 tablet 0  . methotrexate (RHEUMATREX) 2.5 MG tablet Take 6 tablets (15 mg total) by mouth once a week. Caution:Chemotherapy. Protect from light. 72 tablet 0  . phentermine 37.5 MG capsule Take 1 capsule (37.5 mg total) by mouth every morning. 30 capsule 0  . tizanidine (ZANAFLEX) 6 MG capsule Take 1 capsule (6 mg total) by mouth 3 (three) times daily as needed for muscle spasms. 90 capsule 5   No current facility-administered medications  for this visit.     Family History  Problem Relation Age of Onset  . Arthritis Mother   . High blood pressure Mother   . Sleep apnea Mother   . High blood pressure Father   . Breast cancer Paternal Aunt   . Arthritis Sister   . Healthy Daughter   . Healthy Son   . Adrenal disorder Neg Hx   . Colon cancer Neg Hx   . Esophageal cancer Neg Hx   . Pancreatic cancer Neg Hx   . Stomach cancer Neg Hx   . Liver disease Neg Hx     Review of Systems  Constitutional: Negative.   HENT: Negative.   Eyes: Negative.    Respiratory: Negative.   Cardiovascular: Negative.   Gastrointestinal: Negative.   Endocrine: Negative.   Genitourinary:       Loss of sexual interest Hot flashes  Musculoskeletal: Negative.        Joint pain  Skin:       Excessive hair growth  Allergic/Immunologic: Negative.   Neurological: Negative.   Hematological: Negative.   Psychiatric/Behavioral: Negative.     Exam:   BP 138/84 (BP Location: Right Arm, Patient Position: Sitting, Cuff Size: Normal)   Pulse 72   Ht 5' 9.69" (1.77 m)   Wt 287 lb (130.2 kg)   BMI 41.55 kg/m   Weight change: @WEIGHTCHANGE @ Height:   Height: 5' 9.69" (177 cm)  Ht Readings from Last 3 Encounters:  01/02/19 5' 9.69" (1.77 m)  12/09/18 5\' 8"  (1.727 m)  11/03/18 5\' 8"  (1.727 m)    General appearance: alert, cooperative and appears stated age Head: Normocephalic, without obvious abnormality, atraumatic Neck: no adenopathy, supple, symmetrical, trachea midline and thyroid right thyroid lobe enlarged compared to the left Lungs: clear to auscultation bilaterally Cardiovascular: regular rate and rhythm Breasts: normal appearance, no masses or tenderness Abdomen: soft, non-tender; non distended,  no masses,  no organomegaly Extremities: extremities normal, atraumatic, no cyanosis or edema Skin: Skin color, texture, turgor normal. No rashes or lesions Lymph nodes: Cervical, supraclavicular, and axillary nodes normal. No abnormal inguinal nodes palpated Neurologic: Grossly normal   Pelvic: External genitalia:  no lesions              Urethra:  normal appearing urethra with no masses, tenderness or lesions              Bartholins and Skenes: normal                 Vagina: normal appearing vagina with normal color and discharge, no lesions              Cervix: nabothian cyst and no lesions               Bimanual Exam:  Uterus:  no masses or tenderness              Adnexa: no mass, fullness, tenderness               Rectovaginal: Confirms                Anus:  normal sphincter tone, no lesions  Chaperone was present for exam.  A:  Well Woman with normal exam  Vasomotor symptoms  Thyromegaly R>L  P:   Pap with hpv  Labs with primary  Mammogram due  Discussed breast self exam  Discussed calcium and vit D intake  Discussed avoiding triggers and behavioral changes for vasomotor symptoms  Start gabapentin  for vasomotor symptoms  F/u in one month (call after 2 weeks)  TFT's, Thyroid ultrasound

## 2019-01-02 NOTE — Patient Instructions (Addendum)
Call the office after you have been on the gabapentin for 2 weeks to check in    EXERCISE AND DIET:  We recommended that you start or continue a regular exercise program for good health. Regular exercise means any activity that makes your heart beat faster and makes you sweat.  We recommend exercising at least 30 minutes per day at least 3 days a week, preferably 4 or 5.  We also recommend a diet low in fat and sugar.  Inactivity, poor dietary choices and obesity can cause diabetes, heart attack, stroke, and kidney damage, among others.    ALCOHOL AND SMOKING:  Women should limit their alcohol intake to no more than 7 drinks/beers/glasses of wine (combined, not each!) per week. Moderation of alcohol intake to this level decreases your risk of breast cancer and liver damage. And of course, no recreational drugs are part of a healthy lifestyle.  And absolutely no smoking or even second hand smoke. Most people know smoking can cause heart and lung diseases, but did you know it also contributes to weakening of your bones? Aging of your skin?  Yellowing of your teeth and nails?  CALCIUM AND VITAMIN D:  Adequate intake of calcium and Vitamin D are recommended.  The recommendations for exact amounts of these supplements seem to change often, but generally speaking 1,200 mg of calcium (between diet and supplement) and 800 units of Vitamin D per day seems prudent. Certain women may benefit from higher intake of Vitamin D.  If you are among these women, your doctor will have told you during your visit.    PAP SMEARS:  Pap smears, to check for cervical cancer or precancers,  have traditionally been done yearly, although recent scientific advances have shown that most women can have pap smears less often.  However, every woman still should have a physical exam from her gynecologist every year. It will include a breast check, inspection of the vulva and vagina to check for abnormal growths or skin changes, a visual exam  of the cervix, and then an exam to evaluate the size and shape of the uterus and ovaries.  And after 49 years of age, a rectal exam is indicated to check for rectal cancers. We will also provide age appropriate advice regarding health maintenance, like when you should have certain vaccines, screening for sexually transmitted diseases, bone density testing, colonoscopy, mammograms, etc.   MAMMOGRAMS:  All women over 47 years old should have a yearly mammogram. Many facilities now offer a "3D" mammogram, which may cost around $50 extra out of pocket. If possible,  we recommend you accept the option to have the 3D mammogram performed.  It both reduces the number of women who will be called back for extra views which then turn out to be normal, and it is better than the routine mammogram at detecting truly abnormal areas.    COLON CANCER SCREENING: Now recommend starting at age 84. At this time colonoscopy is not covered for routine screening until 50. There are take home tests that can be done between 45-49.   COLONOSCOPY:  Colonoscopy to screen for colon cancer is recommended for all women at age 35.  We know, you hate the idea of the prep.  We agree, BUT, having colon cancer and not knowing it is worse!!  Colon cancer so often starts as a polyp that can be seen and removed at colonscopy, which can quite literally save your life!  And if your first colonoscopy is  normal and you have no family history of colon cancer, most women don't have to have it again for 10 years.  Once every ten years, you can do something that may end up saving your life, right?  We will be happy to help you get it scheduled when you are ready.  Be sure to check your insurance coverage so you understand how much it will cost.  It may be covered as a preventative service at no cost, but you should check your particular policy.      Breast Self-Awareness Breast self-awareness means being familiar with how your breasts look and feel. It  involves checking your breasts regularly and reporting any changes to your health care provider. Practicing breast self-awareness is important. A change in your breasts can be a sign of a serious medical problem. Being familiar with how your breasts look and feel allows you to find any problems early, when treatment is more likely to be successful. All women should practice breast self-awareness, including women who have had breast implants. How to do a breast self-exam One way to learn what is normal for your breasts and whether your breasts are changing is to do a breast self-exam. To do a breast self-exam: Look for Changes  1. Remove all the clothing above your waist. 2. Stand in front of a mirror in a room with good lighting. 3. Put your hands on your hips. 4. Push your hands firmly downward. 5. Compare your breasts in the mirror. Look for differences between them (asymmetry), such as: ? Differences in shape. ? Differences in size. ? Puckers, dips, and bumps in one breast and not the other. 6. Look at each breast for changes in your skin, such as: ? Redness. ? Scaly areas. 7. Look for changes in your nipples, such as: ? Discharge. ? Bleeding. ? Dimpling. ? Redness. ? A change in position. Feel for Changes Carefully feel your breasts for lumps and changes. It is best to do this while lying on your back on the floor and again while sitting or standing in the shower or tub with soapy water on your skin. Feel each breast in the following way:  Place the arm on the side of the breast you are examining above your head.  Feel your breast with the other hand.  Start in the nipple area and make  inch (2 cm) overlapping circles to feel your breast. Use the pads of your three middle fingers to do this. Apply light pressure, then medium pressure, then firm pressure. The light pressure will allow you to feel the tissue closest to the skin. The medium pressure will allow you to feel the tissue  that is a little deeper. The firm pressure will allow you to feel the tissue close to the ribs.  Continue the overlapping circles, moving downward over the breast until you feel your ribs below your breast.  Move one finger-width toward the center of the body. Continue to use the  inch (2 cm) overlapping circles to feel your breast as you move slowly up toward your collarbone.  Continue the up and down exam using all three pressures until you reach your armpit.  Write Down What You Find  Write down what is normal for each breast and any changes that you find. Keep a written record with breast changes or normal findings for each breast. By writing this information down, you do not need to depend only on memory for size, tenderness, or location. Write down  where you are in your menstrual cycle, if you are still menstruating. If you are having trouble noticing differences in your breasts, do not get discouraged. With time you will become more familiar with the variations in your breasts and more comfortable with the exam. How often should I examine my breasts? Examine your breasts every month. If you are breastfeeding, the best time to examine your breasts is after a feeding or after using a breast pump. If you menstruate, the best time to examine your breasts is 5-7 days after your period is over. During your period, your breasts are lumpier, and it may be more difficult to notice changes. When should I see my health care provider? See your health care provider if you notice:  A change in shape or size of your breasts or nipples.  A change in the skin of your breast or nipples, such as a reddened or scaly area.  Unusual discharge from your nipples.  A lump or thick area that was not there before.  Pain in your breasts.  Anything that concerns you.    Menopause Menopause is the normal time of life when menstrual periods stop completely. It is usually confirmed by 12 months without a  menstrual period. The transition to menopause (perimenopause) most often happens between the ages of 65 and 25. During perimenopause, hormone levels change in your body, which can cause symptoms and affect your health. Menopause may increase your risk for:  Loss of bone (osteoporosis), which causes bone breaks (fractures).  Depression.  Hardening and narrowing of the arteries (atherosclerosis), which can cause heart attacks and strokes. What are the causes? This condition is usually caused by a natural change in hormone levels that happens as you get older. The condition may also be caused by surgery to remove both ovaries (bilateral oophorectomy). What increases the risk? This condition is more likely to start at an earlier age if you have certain medical conditions or treatments, including:  A tumor of the pituitary gland in the brain.  A disease that affects the ovaries and hormone production.  Radiation treatment for cancer.  Certain cancer treatments, such as chemotherapy or hormone (anti-estrogen) therapy.  Heavy smoking and excessive alcohol use.  Family history of early menopause. This condition is also more likely to develop earlier in women who are very thin. What are the signs or symptoms? Symptoms of this condition include:  Hot flashes.  Irregular menstrual periods.  Night sweats.  Changes in feelings about sex. This could be a decrease in sex drive or an increased comfort around your sexuality.  Vaginal dryness and thinning of the vaginal walls. This may cause painful intercourse.  Dryness of the skin and development of wrinkles.  Headaches.  Problems sleeping (insomnia).  Mood swings or irritability.  Memory problems.  Weight gain.  Hair growth on the face and chest.  Bladder infections or problems with urinating. How is this diagnosed? This condition is diagnosed based on your medical history, a physical exam, your age, your menstrual history, and  your symptoms. Hormone tests may also be done. How is this treated? In some cases, no treatment is needed. You and your health care provider should make a decision together about whether treatment is necessary. Treatment will be based on your individual condition and preferences. Treatment for this condition focuses on managing symptoms. Treatment may include:  Menopausal hormone therapy (MHT).  Medicines to treat specific symptoms or complications.  Acupuncture.  Vitamin or herbal supplements. Before starting treatment,  make sure to let your health care provider know if you have a personal or family history of:  Heart disease.  Breast cancer.  Blood clots.  Diabetes.  Osteoporosis. Follow these instructions at home: Lifestyle  Do not use any products that contain nicotine or tobacco, such as cigarettes and e-cigarettes. If you need help quitting, ask your health care provider.  Get at least 30 minutes of physical activity on 5 or more days each week.  Avoid alcoholic and caffeinated beverages, as well as spicy foods. This may help prevent hot flashes.  Get 7-8 hours of sleep each night.  If you have hot flashes, try: ? Dressing in layers. ? Avoiding things that may trigger hot flashes, such as spicy food, warm places, or stress. ? Taking slow, deep breaths when a hot flash starts. ? Keeping a fan in your home and office.  Find ways to manage stress, such as deep breathing, meditation, or journaling.  Consider going to group therapy with other women who are having menopause symptoms. Ask your health care provider about recommended group therapy meetings. Eating and drinking  Eat a healthy, balanced diet that contains whole grains, lean protein, low-fat dairy, and plenty of fruits and vegetables.  Your health care provider may recommend adding more soy to your diet. Foods that contain soy include tofu, tempeh, and soy milk.  Eat plenty of foods that contain calcium and  vitamin D for bone health. Items that are rich in calcium include low-fat milk, yogurt, beans, almonds, sardines, broccoli, and kale. Medicines  Take over-the-counter and prescription medicines only as told by your health care provider.  Talk with your health care provider before starting any herbal supplements. If prescribed, take vitamins and supplements as told by your health care provider. These may include: ? Calcium. Women age 66 and older should get 1,200 mg (milligrams) of calcium every day. ? Vitamin D. Women need 600-800 International Units of vitamin D each day. ? Vitamins B12 and B6. Aim for 50 micrograms of B12 and 1.5 mg of B6 each day. General instructions  Keep track of your menstrual periods, including: ? When they occur. ? How heavy they are and how long they last. ? How much time passes between periods.  Keep track of your symptoms, noting when they start, how often you have them, and how long they last.  Use vaginal lubricants or moisturizers to help with vaginal dryness and improve comfort during sex.  Keep all follow-up visits as told by your health care provider. This is important. This includes any group therapy or counseling. Contact a health care provider if:  You are still having menstrual periods after age 87.  You have pain during sex.  You have not had a period for 12 months and you develop vaginal bleeding. Get help right away if:  You have: ? Severe depression. ? Excessive vaginal bleeding. ? Pain when you urinate. ? A fast or irregular heart beat (palpitations). ? Severe headaches. ? Abdomen (abdominal) pain or severe indigestion.  You fell and you think you have a broken bone.  You develop leg or chest pain.  You develop vision problems.  You feel a lump in your breast. Summary  Menopause is the normal time of life when menstrual periods stop completely. It is usually confirmed by 12 months without a menstrual period.  The transition  to menopause (perimenopause) most often happens between the ages of 22 and 25.  Symptoms can be managed through medicines, lifestyle changes,  and complementary therapies such as acupuncture.  Eat a balanced diet that is rich in nutrients to promote bone health and heart health and to manage symptoms during menopause. This information is not intended to replace advice given to you by your health care provider. Make sure you discuss any questions you have with your health care provider. Document Released: 02/06/2004 Document Revised: 12/19/2016 Document Reviewed: 12/19/2016 Elsevier Interactive Patient Education  2019 [employer].

## 2019-01-03 DIAGNOSIS — M47817 Spondylosis without myelopathy or radiculopathy, lumbosacral region: Secondary | ICD-10-CM | POA: Diagnosis not present

## 2019-01-03 LAB — THYROID PANEL WITH TSH
Free Thyroxine Index: 3.4 (ref 1.2–4.9)
T3 UPTAKE RATIO: 33 % (ref 24–39)
T4 TOTAL: 10.2 ug/dL (ref 4.5–12.0)
TSH: 1 u[IU]/mL (ref 0.450–4.500)

## 2019-01-06 DIAGNOSIS — M19071 Primary osteoarthritis, right ankle and foot: Secondary | ICD-10-CM | POA: Diagnosis not present

## 2019-01-06 DIAGNOSIS — M79672 Pain in left foot: Secondary | ICD-10-CM | POA: Diagnosis not present

## 2019-01-06 DIAGNOSIS — M79671 Pain in right foot: Secondary | ICD-10-CM | POA: Diagnosis not present

## 2019-01-06 DIAGNOSIS — M19072 Primary osteoarthritis, left ankle and foot: Secondary | ICD-10-CM | POA: Diagnosis not present

## 2019-01-09 ENCOUNTER — Ambulatory Visit
Admission: RE | Admit: 2019-01-09 | Discharge: 2019-01-09 | Disposition: A | Discharge status: Home / Self Care | Payer: BLUE CROSS/BLUE SHIELD | Source: Ambulatory Visit | Attending: Obstetrics and Gynecology | Admitting: Obstetrics and Gynecology

## 2019-01-09 ENCOUNTER — Telehealth: Payer: Self-pay | Admitting: Obstetrics and Gynecology

## 2019-01-09 DIAGNOSIS — E049 Nontoxic goiter, unspecified: Secondary | ICD-10-CM

## 2019-01-09 DIAGNOSIS — Z1211 Encounter for screening for malignant neoplasm of colon: Secondary | ICD-10-CM | POA: Diagnosis not present

## 2019-01-09 DIAGNOSIS — E010 Iodine-deficiency related diffuse (endemic) goiter: Secondary | ICD-10-CM | POA: Diagnosis not present

## 2019-01-09 NOTE — Telephone Encounter (Signed)
Patient would like to speak with nurse about her thyroid u/s results from today.

## 2019-01-09 NOTE — Telephone Encounter (Signed)
Spoke with patient, reviewed thyroid US results dated 01/09/19. Questions answered, patient verbalizes understanding, thankful for return call.    Viewed by Vanessa Kick on 01/09/2019 2:47 PM  Written by Salvadore Dom, MD on 01/09/2019 1:05 PM  Hi Meggie,  Your thyroid ultrasound showed some small benign cysts. No follow up is needed.  Please call with any questions.  Sumner Boast   Encounter closed.

## 2019-01-10 ENCOUNTER — Other Ambulatory Visit (HOSPITAL_COMMUNITY)
Admission: RE | Admit: 2019-01-10 | Discharge: 2019-01-10 | Disposition: A | Discharge status: Home / Self Care | Payer: BLUE CROSS/BLUE SHIELD | Source: Ambulatory Visit | Attending: Obstetrics and Gynecology | Admitting: Obstetrics and Gynecology

## 2019-01-10 DIAGNOSIS — Z124 Encounter for screening for malignant neoplasm of cervix: Secondary | ICD-10-CM | POA: Diagnosis not present

## 2019-01-10 NOTE — Addendum Note (Signed)
Addended by: Dorothy Spark on: 01/10/2019 05:03 PM   Modules accepted: Orders

## 2019-01-11 LAB — FECAL OCCULT BLOOD, IMMUNOCHEMICAL: Fecal Occult Bld: NEGATIVE

## 2019-01-13 LAB — CYTOLOGY - PAP
Diagnosis: UNDETERMINED — AB
HPV (WINDOPATH): DETECTED — AB

## 2019-01-18 ENCOUNTER — Other Ambulatory Visit: Payer: Self-pay | Admitting: *Deleted

## 2019-01-18 ENCOUNTER — Telehealth: Payer: Self-pay

## 2019-01-18 DIAGNOSIS — R87810 Cervical high risk human papillomavirus (HPV) DNA test positive: Principal | ICD-10-CM

## 2019-01-18 DIAGNOSIS — N644 Mastodynia: Secondary | ICD-10-CM

## 2019-01-18 DIAGNOSIS — R87610 Atypical squamous cells of undetermined significance on cytologic smear of cervix (ASC-US): Secondary | ICD-10-CM

## 2019-01-18 NOTE — Progress Notes (Signed)
GYNECOLOGY  VISIT   HPI: 48 y.o.   Married Black or [place] American Not Hispanic or Latino  female   [phone] with No LMP recorded. Patient is perimenopausal.   here for colposcopy due to ASCUS pap with positive HPV.  In 12/18 her pap was normal with +HPV but negative 16/18/45. No prior abnormal paps.  GYNECOLOGIC HISTORY: No LMP recorded. Patient is perimenopausal. Contraception:  None Menopausal hormone therapy: None OB History    Gravida  2   Para  2   Term      Preterm  2   AB      Living  2     SAB      TAB      Ectopic      Multiple      Live Births  2              Patient Active Problem List   Diagnosis Date Noted  . Primary osteoarthritis of both hips 07/29/2018  . Primary osteoarthritis of both knees 07/29/2018  . Primary osteoarthritis of both feet 07/29/2018  . Tonsillitis 05/02/2018  . Hypokalemia 09/03/2017  . Acute prerenal failure ([place]) 09/03/2017  . OSA (obstructive sleep apnea) 12/10/2016  . GERD (gastroesophageal reflux disease) 12/04/2016  . Hypersomnia 10/29/2016  . Back pain 08/21/2016  . Chronic pain of multiple joints 08/13/2016  . MGUS (monoclonal gammopathy of unknown significance) 07/02/2016  . Vitamin D deficiency 07/02/2016  . Diarrhea 07/02/2016  . Joint pain 04/03/2016  . HTN (hypertension), benign 11/11/2015  . Morbid obesity ([place]) 11/11/2015  . Adrenal mass ([place]) 08/30/2015  . Ovarian cyst 08/30/2015    Past Medical History:  Diagnosis Date  . Arthritis   . Gallstones   . GERD (gastroesophageal reflux disease)   . Hypertension   . MGUS (monoclonal gammopathy of unknown significance)   . Obesity   . Osteoarthritis   . PONV (postoperative nausea and vomiting)   . Rheumatoid arthritis ([place])   . Sleep apnea    SENT CPAP BACK, NOT WORN IN 7-8 MONTHS    Past Surgical History:  Procedure Laterality Date  . BREAST DUCTAL SYSTEM EXCISION Bilateral 10/12/2017   Procedure: EXCISION OF BILATERAL CENTRAL DUCT;   Surgeon: Erroll Luna, MD;  Location: [facility];  Service: General;  Laterality: Bilateral;  . BREAST EXCISIONAL BIOPSY    . CHOLECYSTECTOMY N/A 12/21/2015   Procedure: LAPAROSCOPIC CHOLECYSTECTOMY;  Surgeon: Georganna Skeans, MD;  Location: [facility];  Service: General;  Laterality: N/A;  . UMBILICAL HERNIA REPAIR N/A 12/27/2015   Procedure: INCARCERATED UMBILICAL HERNIA REPAIR;  Surgeon: Coralie Keens, MD;  Location: [facility];  Service: General;  Laterality: N/A;    Current Outpatient Medications  Medication Sig Dispense Refill  . amLODipine (NORVASC) 10 MG tablet Take 1 tablet (10 mg total) by mouth daily. Needs office visit for more refills 90 tablet 0  . folic acid (FOLVITE) 1 MG tablet Take 2 tablets (2 mg total) by mouth daily. 180 tablet 3  . gabapentin (NEURONTIN) 100 MG capsule One tablet po qhs x 3 nights, if tolerating can increase to 2 tablets po qhs x 3 nights, if tolerating can increase to 3 tablets po qhs 90 capsule 1  . hydrochlorothiazide (HYDRODIURIL) 25 MG tablet Take 1 tablet (25 mg total) by mouth daily. Needs office visit for more refills 90 tablet 0  . lisinopril (PRINIVIL,ZESTRIL) 40 MG tablet Take 1 tablet (40 mg total) by mouth daily. Needs OV for more refills 90 tablet  0  . methotrexate (RHEUMATREX) 2.5 MG tablet Take 6 tablets (15 mg total) by mouth once a week. Caution:Chemotherapy. Protect from light. 72 tablet 0  . phentermine 37.5 MG capsule Take 1 capsule (37.5 mg total) by mouth every morning. 30 capsule 0  . tizanidine (ZANAFLEX) 6 MG capsule Take 1 capsule (6 mg total) by mouth 3 (three) times daily as needed for muscle spasms. 90 capsule 5   No current facility-administered medications for this visit.      ALLERGIES: Patient has no known allergies.  Family History  Problem Relation Age of Onset  . Arthritis Mother   . High blood pressure Mother   . Sleep apnea Mother   . High blood pressure Father   . Breast cancer Paternal Aunt   . Arthritis Sister   .  Healthy Daughter   . Healthy Son   . Adrenal disorder Neg Hx   . Colon cancer Neg Hx   . Esophageal cancer Neg Hx   . Pancreatic cancer Neg Hx   . Stomach cancer Neg Hx   . Liver disease Neg Hx     Social History   Socioeconomic History  . Marital status: Married    Spouse name: Dreonna Hussein  . Number of children: 2  . Years of education: Not on file  . Highest education level: Not on file  Occupational History  . Occupation: Stay at [employer]: unemploed  Social Needs  . Financial resource strain: Not on file  . Food insecurity:    Worry: Not on file    Inability: Not on file  . Transportation needs:    Medical: Not on file    Non-medical: Not on file  Tobacco Use  . Smoking status: Current Every Day Smoker    Packs/day: 0.25    Years: 24.00    Pack years: 6.00    Types: Cigarettes  . Smokeless tobacco: Never Used  Substance and Sexual Activity  . Alcohol use: Yes    Alcohol/week: 2.0 standard drinks    Types: 2 Shots of liquor per week    Comment: per week  . Drug use: Yes    Types: Marijuana  . Sexual activity: Yes    Partners: Male    Birth control/protection: None  Lifestyle  . Physical activity:    Days per week: Not on file    Minutes per session: Not on file  . Stress: Not on file  Relationships  . Social connections:    Talks on phone: Not on file    Gets together: Not on file    Attends religious service: Not on file    Active member of club or organization: Not on file    Attends meetings of clubs or organizations: Not on file    Relationship status: Not on file  . Intimate partner violence:    Fear of current or ex partner: Not on file    Emotionally abused: Not on file    Physically abused: Not on file    Forced sexual activity: Not on file  Other Topics Concern  . Not on file  Social History Narrative   Lives with husband, Berneta Sages.    Review of Systems  Constitutional:       Pain in right breast  HENT: Negative.   Eyes:  Negative.   Respiratory: Negative.   Cardiovascular: Negative.   Gastrointestinal: Negative.   Genitourinary: Negative.   Musculoskeletal: Negative.   Skin: Negative.  Neurological: Negative.   Endo/Heme/Allergies: Negative.   Psychiatric/Behavioral: Negative.     PHYSICAL EXAMINATION:    BP 122/82 (BP Location: Right Arm, Patient Position: Sitting, Cuff Size: Large)   Pulse 64   Wt 286 lb 3.2 oz (129.8 kg)   BMI 41.44 kg/m     General appearance: alert, cooperative and appears stated age  Pelvic: External genitalia:  no lesions              Urethra:  normal appearing urethra with no masses, tenderness or lesions              Bartholins and Skenes: normal                 Vagina: normal appearing vagina with normal color and discharge, no lesions              Cervix:  No gross lesions  Colposcopy: unsatisfactory, minimal acetowhite changes on the anterior cervical lip, biopsy at 11 o'clock, Minimal bleeding treated with silver nitrate. ECC done. Lugols exam with decreased uptake on the anterior cervical lip and spotting areas of decreased uptake on the anterior and right lateral vagina. Vaginal biopsy at 10 o'clock, no active bleeding with biopsy. Chaperone was present for exam.  ASSESSMENT ASCUS, +HPV pap    PLAN Colposcopy with cervical biopsy, vaginal biopsy and ECC Further plans depending on results.    An After Visit Summary was printed and given to the patient.

## 2019-01-18 NOTE — Telephone Encounter (Signed)
Copied from [place] [id number]. Topic: Referral - Request for Referral >> Jan 18, 2019  9:23 AM Antonieta Iba C wrote: Pt says that she has a cyst on her left breast. Pt says that when this happened before she was seen at the breast center. Pt says that she called the Breast Center on [address] and they advised her to call her PCP and request a referral to be seen.    Please assist.

## 2019-01-18 NOTE — Telephone Encounter (Signed)
Referral put in.

## 2019-01-19 ENCOUNTER — Other Ambulatory Visit: Payer: Self-pay

## 2019-01-19 ENCOUNTER — Encounter: Payer: Self-pay | Admitting: Obstetrics and Gynecology

## 2019-01-19 ENCOUNTER — Ambulatory Visit (INDEPENDENT_AMBULATORY_CARE_PROVIDER_SITE_OTHER): Payer: BLUE CROSS/BLUE SHIELD | Admitting: Obstetrics and Gynecology

## 2019-01-19 VITALS — BP 122/82 | HR 64 | Wt 286.2 lb

## 2019-01-19 DIAGNOSIS — R87810 Cervical high risk human papillomavirus (HPV) DNA test positive: Secondary | ICD-10-CM

## 2019-01-19 DIAGNOSIS — R87610 Atypical squamous cells of undetermined significance on cytologic smear of cervix (ASC-US): Secondary | ICD-10-CM | POA: Diagnosis not present

## 2019-01-19 DIAGNOSIS — N880 Leukoplakia of cervix uteri: Secondary | ICD-10-CM | POA: Diagnosis not present

## 2019-01-19 DIAGNOSIS — Z01812 Encounter for preprocedural laboratory examination: Secondary | ICD-10-CM

## 2019-01-19 DIAGNOSIS — N894 Leukoplakia of vagina: Secondary | ICD-10-CM | POA: Diagnosis not present

## 2019-01-19 LAB — POCT URINE PREGNANCY: Preg Test, Ur: NEGATIVE

## 2019-01-19 NOTE — Patient Instructions (Signed)

## 2019-01-20 ENCOUNTER — Other Ambulatory Visit: Payer: BLUE CROSS/BLUE SHIELD

## 2019-01-20 NOTE — Patient Instructions (Addendum)
   Medications reviewed and updated.  Changes include :   none  Your prescription(s) have been submitted to your pharmacy. Please take as directed and contact our office if you believe you are having problem(s) with the medication(s).    Please followup in 6 months   

## 2019-01-20 NOTE — Progress Notes (Signed)
Office Visit Note  Patient: Alexandra Powers             Date of Birth: 1971-01-25           MRN: [id number]             PCP: Binnie Rail, MD Referring: Binnie Rail, MD Visit Date: 02/02/2019 Occupation: @GUAROCC @  Subjective:  Pain in both hands     History of Present Illness: Alexandra Powers is a 48 y.o. female with history of seropositive rheumatoid arthritis and osteoarthritis.  She is not taking PLQ or MTX at this time.  She reports that in the past she tried Plaquenil for 3 months but did not notice much improvement so discontinued.  She tried methotrexate for 1 month but did not notice any benefit and discontinued.  She denies experiencing any side effects related to MTX.  She is willing to retry methotrexate.  She reports she is having increased pain in bilateral hands and the left shoulder joint.  She denies any joint swelling at this time.  She states that she does have chronic pain in both knee joints and sees an orthopedist at [facility].   Activities of Daily Living:  Patient reports morning stiffness for 45  minutes.   Patient Denies nocturnal pain.  Difficulty dressing/grooming: Denies Difficulty climbing stairs: Reports Difficulty getting out of chair: Reports Difficulty using hands for taps, buttons, cutlery, and/or writing: Reports  Review of Systems  Constitutional: Negative for fatigue.  HENT: Positive for mouth dryness. Negative for mouth sores and nose dryness.   Eyes: Negative for pain, visual disturbance and dryness.  Respiratory: Negative for cough, hemoptysis, shortness of breath and difficulty breathing.   Cardiovascular: Negative for chest pain, palpitations, hypertension and swelling in legs/feet.  Gastrointestinal: Negative for blood in stool, constipation and diarrhea.  Endocrine: Negative for increased urination.  Genitourinary: Negative for painful urination.  Musculoskeletal: Positive for arthralgias, joint pain and morning stiffness. Negative  for joint swelling, myalgias, muscle weakness, muscle tenderness and myalgias.  Skin: Negative for color change, pallor, rash, hair loss, nodules/bumps, skin tightness, ulcers and sensitivity to sunlight.  Allergic/Immunologic: Negative for susceptible to infections.  Neurological: Negative for dizziness, numbness, headaches and weakness.  Hematological: Negative for swollen glands.  Psychiatric/Behavioral: Negative for depressed mood and sleep disturbance. The patient is not nervous/anxious.     PMFS History:  Patient Active Problem List   Diagnosis Date Noted  . Primary osteoarthritis of both hips 07/29/2018  . Primary osteoarthritis of both knees 07/29/2018  . Primary osteoarthritis of both feet 07/29/2018  . Tonsillitis 05/02/2018  . Hypokalemia 09/03/2017  . Acute prerenal failure ([place]) 09/03/2017  . OSA (obstructive sleep apnea) 12/10/2016  . GERD (gastroesophageal reflux disease) 12/04/2016  . Hypersomnia 10/29/2016  . Back pain 08/21/2016  . Chronic pain of multiple joints 08/13/2016  . MGUS (monoclonal gammopathy of unknown significance) 07/02/2016  . Vitamin D deficiency 07/02/2016  . Diarrhea 07/02/2016  . Joint pain 04/03/2016  . HTN (hypertension), benign 11/11/2015  . Morbid obesity ([place]) 11/11/2015  . Adrenal mass ([place]) 08/30/2015  . Ovarian cyst 08/30/2015    Past Medical History:  Diagnosis Date  . Arthritis   . Gallstones   . GERD (gastroesophageal reflux disease)   . Hypertension   . MGUS (monoclonal gammopathy of unknown significance)   . Obesity   . Osteoarthritis   . PONV (postoperative nausea and vomiting)   . Rheumatoid arthritis ([place])   . Sleep apnea  SENT CPAP BACK, NOT WORN IN 7-8 MONTHS    Family History  Problem Relation Age of Onset  . Arthritis Mother   . High blood pressure Mother   . Sleep apnea Mother   . High blood pressure Father   . Breast cancer Paternal Aunt   . Arthritis Sister   . Healthy Daughter   . Healthy Son   .  Adrenal disorder Neg Hx   . Colon cancer Neg Hx   . Esophageal cancer Neg Hx   . Pancreatic cancer Neg Hx   . Stomach cancer Neg Hx   . Liver disease Neg Hx    Past Surgical History:  Procedure Laterality Date  . BREAST DUCTAL SYSTEM EXCISION Bilateral 10/12/2017   Procedure: EXCISION OF BILATERAL CENTRAL DUCT;  Surgeon: Erroll Luna, MD;  Location: [place];  Service: General;  Laterality: Bilateral;  . BREAST EXCISIONAL BIOPSY    . CHOLECYSTECTOMY N/A 12/21/2015   Procedure: LAPAROSCOPIC CHOLECYSTECTOMY;  Surgeon: Georganna Skeans, MD;  Location: [place];  Service: General;  Laterality: N/A;  . UMBILICAL HERNIA REPAIR N/A 12/27/2015   Procedure: INCARCERATED UMBILICAL HERNIA REPAIR;  Surgeon: Coralie Keens, MD;  Location: [place];  Service: General;  Laterality: N/A;   Social History   Social History Narrative   Lives with husband, Berneta Sages.   Immunization History  Administered Date(s) Administered  . Influenza,inj,Quad PF,6+ Mos 08/29/2015, 08/21/2016, 08/31/2017, 08/09/2018  . Influenza,inj,quad, With Preservative 08/30/2017  . Tdap 08/29/2015     Objective: Vital Signs: BP 128/90 (BP Location: Left Arm, Patient Position: Sitting, Cuff Size: Large)   Pulse 71   Resp 16   Ht 5\' 8"  (1.727 m)   Wt 289 lb 9.6 oz (131.4 kg)   BMI 44.03 kg/m    Physical Exam Vitals signs and nursing note reviewed.  Constitutional:      Appearance: She is well-developed.  HENT:     Head: Normocephalic and atraumatic.  Eyes:     Conjunctiva/sclera: Conjunctivae normal.  Neck:     Musculoskeletal: Normal range of motion.  Cardiovascular:     Rate and Rhythm: Normal rate and regular rhythm.     Heart sounds: Normal heart sounds.  Pulmonary:     Effort: Pulmonary effort is normal.     Breath sounds: Normal breath sounds.  Abdominal:     General: Bowel sounds are normal.     Palpations: Abdomen is soft.  Lymphadenopathy:     Cervical: No cervical adenopathy.  Skin:    General: Skin is  warm and dry.     Capillary Refill: Capillary refill takes less than 2 seconds.  Neurological:     Mental Status: She is alert and oriented to person, place, and time.  Psychiatric:        Behavior: Behavior normal.      Musculoskeletal Exam: C-spine, thoracic spine, lumbar spine good range of motion.  No midline spinal tenderness.  No SI joint tenderness.  Shoulder joints, elbow joints, wrist joints, MCPs, PIPs, DIPs good range of motion no synovitis.  She has complete fist formation bilaterally.  Hip joints, knee joints, ankle joints, MTPs, PIPs, DIPs good range of motion no synovitis.  No warmth or effusion of bilateral knee joints.  No tenderness or swelling of ankle joints.  No tenderness over trochanter bursa bilaterally.  CDAI Exam: CDAI Score: 1  Patient Global Assessment: 5 (mm); Provider Global Assessment: 5 (mm) Swollen: 0 ; Tender: 0  Joint Exam   Not documented   There  is currently no information documented on the homunculus. Go to the Rheumatology activity and complete the homunculus joint exam.  Investigation: No additional findings.  Imaging: US Breast Ltd Uni Left Inc Axilla  Result Date: 01/25/2019 CLINICAL DATA:  Mass near the LEFT nipple noted 1 week ago. Five days ago the mass spontaneously ruptured and approximate 1 tablespoon of purulent fluid drained. The lesion continues to drain fluid. Mass is now smaller since first found. History of LEFT breast abscess 2018 requiring ultrasound-guided aspirations. Patient had a mass in the RIGHT nipple in 2019 which resolved on antibiotics she was taking for dental problem. EXAM: DIGITAL DIAGNOSTIC BILATERAL MAMMOGRAM WITH CAD AND TOMO ULTRASOUND LEFT BREAST COMPARISON:  04/01/2018 and earlier ACR Breast Density Category a: The breast tissue is almost entirely fatty. FINDINGS: There is thickening LATERAL to the LEFT nipple, corresponding to the area of concern to the patient. Otherwise, mammogram is negative. Mammographic images  were processed with CAD. On physical exam, there is focal thickening in the 2 o'clock retroareolar location of the LEFT breast. Targeted ultrasound is performed, showing focal thickening in the skin of the location of the LEFT areola, measuring 3.3 centimeters maximum diameter. Within the focal thickening there is a small fluid collection measuring 1.2 x 1.7 x 0.5 centimeters. No underlying parenchymal abnormality identified. IMPRESSION: Intradermal abscess the areola of the LEFT breast. Small amount of residual fluid warrants follow-up. RECOMMENDATION: Patient is given a prescription for 1 week course of doxycycline 100 milligrams by mouth twice daily. Followup LEFT breast ultrasound in 1 week. I have discussed the findings and recommendations with the patient. Results were also provided in writing at the conclusion of the visit. If applicable, a reminder letter will be sent to the patient regarding the next appointment. BI-RADS CATEGORY  2: Benign. Electronically Signed   By: Nolon Nations M.D.   On: 01/25/2019 08:43   Mm Diag Breast Tomo Bilateral  Result Date: 01/25/2019 CLINICAL DATA:  Mass near the LEFT nipple noted 1 week ago. Five days ago the mass spontaneously ruptured and approximate 1 tablespoon of purulent fluid drained. The lesion continues to drain fluid. Mass is now smaller since first found. History of LEFT breast abscess 2018 requiring ultrasound-guided aspirations. Patient had a mass in the RIGHT nipple in 2019 which resolved on antibiotics she was taking for dental problem. EXAM: DIGITAL DIAGNOSTIC BILATERAL MAMMOGRAM WITH CAD AND TOMO ULTRASOUND LEFT BREAST COMPARISON:  04/01/2018 and earlier ACR Breast Density Category a: The breast tissue is almost entirely fatty. FINDINGS: There is thickening LATERAL to the LEFT nipple, corresponding to the area of concern to the patient. Otherwise, mammogram is negative. Mammographic images were processed with CAD. On physical exam, there is focal  thickening in the 2 o'clock retroareolar location of the LEFT breast. Targeted ultrasound is performed, showing focal thickening in the skin of the location of the LEFT areola, measuring 3.3 centimeters maximum diameter. Within the focal thickening there is a small fluid collection measuring 1.2 x 1.7 x 0.5 centimeters. No underlying parenchymal abnormality identified. IMPRESSION: Intradermal abscess the areola of the LEFT breast. Small amount of residual fluid warrants follow-up. RECOMMENDATION: Patient is given a prescription for 1 week course of doxycycline 100 milligrams by mouth twice daily. Followup LEFT breast ultrasound in 1 week. I have discussed the findings and recommendations with the patient. Results were also provided in writing at the conclusion of the visit. If applicable, a reminder letter will be sent to the patient regarding the next appointment. BI-RADS  CATEGORY  2: Benign. Electronically Signed   By: Nolon Nations M.D.   On: 01/25/2019 08:43   US Thyroid  Result Date: 01/09/2019 CLINICAL DATA:  Palpable abnormality. Thyromegaly on physical examination. EXAM: THYROID ULTRASOUND TECHNIQUE: Ultrasound examination of the thyroid gland and adjacent soft tissues was performed. COMPARISON:  None. FINDINGS: Parenchymal Echotexture: Mildly heterogenous Isthmus: Normal in size measuring 0.4 cm in diameter Right lobe: Normal in size measuring 5.3 x 1.6 x 2.5 cm Left lobe: Meters normal in size measuring 5.8 x 1.5 x 2.2 cm _________________________________________________________ Estimated total number of nodules >/= 1 cm: 0 Number of spongiform nodules >/=  2 cm not described below (TR1): 0 Number of mixed cystic and solid nodules >/= 1.5 cm not described below (TR2): 0 _________________________________________________________ There is an approximately 0.4 cm anechoic cyst within the inferior pole of the right lobe of the thyroid as well as an approximately 0.5 cm cyst within the inferior medial  aspect of the left lobe of the thyroid, neither of which meet imaging criteria to recommend percutaneous sampling or continued dedicated follow-up. No regional bulky cervical lymphadenopathy. IMPRESSION: 1. Punctate (sub 0.5 cm) bilateral thyroid cysts, neither of which meet imaging criteria to recommend percutaneous sampling or continued dedicated follow-up. 2. Otherwise, unremarkable thyroid ultrasound. Specifically, no evidence thyromegaly. The above is in keeping with the ACR TI-RADS recommendations - J Am Coll Radiol 2017;14:587-595. Electronically Signed   By: Sandi Mariscal M.D.   On: 01/09/2019 11:01    Recent Labs: Lab Results  Component Value Date   WBC 8.3 08/26/2018   HGB 11.4 (L) 08/26/2018   PLT 269 08/26/2018   NA 142 08/26/2018   K 3.7 08/26/2018   CL 108 08/26/2018   CO2 28 08/26/2018   GLUCOSE 93 08/26/2018   BUN 15 08/26/2018   CREATININE 1.07 (H) 08/26/2018   BILITOT 0.2 (L) 08/26/2018   ALKPHOS 56 08/26/2018   AST 18 08/26/2018   ALT 9 08/26/2018   PROT 7.0 08/26/2018   ALBUMIN 3.2 (L) 08/26/2018   CALCIUM 9.0 08/26/2018   GFRAA >60 08/26/2018   QFTBGOLDPLUS POSITIVE (A) 07/29/2018    Speciality Comments: PLQ Eye Exam: 08/12/18 WNL @ [facility] Follow up in 1 year.   Procedures:  No procedures performed Allergies: Patient has no known allergies.   Assessment / Plan:     Visit Diagnoses: Rheumatoid arthritis involving multiple sites with positive rheumatoid factor (HCC) - Positive RF, positive anti-CCP, positive elevated sedimentation rate, positive synovitis on ultrasound: She has no synovitis on exam.  She is been having increased pain in bilateral hands in the left shoulder joint.  No warmth or effusion of the left shoulder was noted.  She is chronic pain in both knee joints but no redness or effusion was noted.  She is not on any immunosuppressive therapy at this time.  In the past she tried taking Plaquenil for 3 months but did not notice any improvement and  discontinued.  She tried taking methotrexate for 1 month but did not notice any benefit and discontinued.  We discussed that it takes about 3 months to notice a maximum benefits of methotrexate.  She is willing to restart methotrexate at this time.  She will take methotrexate 6 tablets by mouth once weekly and folic acid 2 mg by mouth daily.  We will check lab work today.   High risk medication use -  MTX 6 tablets once weekly, folic acid 2 mg po daily. eye exam: 08/12/2018 -CBC  and CMP will be drawn today.  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   Primary osteoarthritis of both hips: She has good range of motion with no discomfort at this time.  Primary osteoarthritis of both knees: Chronic pain.  No warmth or effusion noted.  She has good range of motion with no discomfort at this time.  Primary osteoarthritis of both feet: She has no discomfort.  Other medical conditions are listed as follows:  Gastroesophageal reflux disease, esophagitis presence not specified  Vitamin D deficiency  OSA (obstructive sleep apnea)  MGUS (monoclonal gammopathy of unknown significance)  HTN (hypertension), benign   Orders: Orders Placed This Encounter  Procedures  . CBC with Differential/Platelet  . COMPLETE METABOLIC PANEL WITH GFR   No orders of the defined types were placed in this encounter.    Follow-Up Instructions: Return in about 5 months (around 07/05/2019) for Rheumatoid arthritis, Osteoarthritis.   Ofilia Neas, PA-C   I examined and evaluated the patient with Hazel Sams PA.  Patient had no synovitis on my examination.  She states she stopped methotrexate and Plaquenil both.  Although she had an adequate response to Plaquenil in the past.  She will resume methotrexate at this point.  The plan of care was discussed as  noted above.  Bo Merino, MD  Note - This record has been created using Editor, commissioning.  Chart creation errors have been sought, but may not always  have been located. Such creation errors do not reflect on  the standard of medical care.

## 2019-01-20 NOTE — Progress Notes (Signed)
Subjective:    [name], female    DOB: 20-Jul-1971, 48 y.o.   MRN: [id number]  HPI The patient is here for follow up.   Obesity:  She was started on phentermine 4 weeks ago.  She denies side effects.  She has lost a few pounds.  She is working on trying to walk more but is limited by her knee pain.  She has decreased her portions and trying to eat more healthy.      Wt Readings from Last 3 Encounters:  01/23/19 291 lb 6.4 oz (132.2 kg)  01/19/19 286 lb 3.2 oz (129.8 kg)  01/02/19 287 lb (130.2 kg)  12/09/2018-      295 lb - our office  Hypertension: She is taking her medication daily. She is compliant with a low sodium diet.  She denies chest pain, palpitations, edema, shortness of breath and regular headaches. She is exercising minimally.    Joint pain:  Her pain is from a combination of RA and OA.   She follows with rheumatology.  She plans on getting her knee replaced once she loses weight.  She is on methotrexate and she does not feel it has helped.     Medications and allergies reviewed with patient and updated if appropriate.  Patient Active Problem List   Diagnosis Date Noted  . Primary osteoarthritis of both hips 07/29/2018  . Primary osteoarthritis of both knees 07/29/2018  . Primary osteoarthritis of both feet 07/29/2018  . Tonsillitis 05/02/2018  . Hypokalemia 09/03/2017  . Acute prerenal failure ([place]) 09/03/2017  . OSA (obstructive sleep apnea) 12/10/2016  . GERD (gastroesophageal reflux disease) 12/04/2016  . Hypersomnia 10/29/2016  . Back pain 08/21/2016  . Chronic pain of multiple joints 08/13/2016  . MGUS (monoclonal gammopathy of unknown significance) 07/02/2016  . Vitamin D deficiency 07/02/2016  . Diarrhea 07/02/2016  . Joint pain 04/03/2016  . HTN (hypertension), benign 11/11/2015  . Morbid obesity ([place]) 11/11/2015  . Adrenal mass ([place]) 08/30/2015  . Ovarian cyst 08/30/2015    Current Outpatient Medications on File Prior to Visit   Medication Sig Dispense Refill  . amLODipine (NORVASC) 10 MG tablet Take 1 tablet (10 mg total) by mouth daily. Needs office visit for more refills 90 tablet 0  . folic acid (FOLVITE) 1 MG tablet Take 2 tablets (2 mg total) by mouth daily. 180 tablet 3  . gabapentin (NEURONTIN) 100 MG capsule One tablet po qhs x 3 nights, if tolerating can increase to 2 tablets po qhs x 3 nights, if tolerating can increase to 3 tablets po qhs 90 capsule 1  . hydrochlorothiazide (HYDRODIURIL) 25 MG tablet Take 1 tablet (25 mg total) by mouth daily. Needs office visit for more refills 90 tablet 0  . lisinopril (PRINIVIL,ZESTRIL) 40 MG tablet Take 1 tablet (40 mg total) by mouth daily. Needs OV for more refills 90 tablet 0  . methotrexate (RHEUMATREX) 2.5 MG tablet Take 6 tablets (15 mg total) by mouth once a week. Caution:Chemotherapy. Protect from light. 72 tablet 0  . phentermine 37.5 MG capsule Take 1 capsule (37.5 mg total) by mouth every morning. 30 capsule 0  . tizanidine (ZANAFLEX) 6 MG capsule Take 1 capsule (6 mg total) by mouth 3 (three) times daily as needed for muscle spasms. 90 capsule 5   No current facility-administered medications on file prior to visit.     Past Medical History:  Diagnosis Date  . Arthritis   . Gallstones   .  GERD (gastroesophageal reflux disease)   . Hypertension   . MGUS (monoclonal gammopathy of unknown significance)   . Obesity   . Osteoarthritis   . PONV (postoperative nausea and vomiting)   . Rheumatoid arthritis ([place])   . Sleep apnea    SENT CPAP BACK, NOT WORN IN 7-8 MONTHS    Past Surgical History:  Procedure Laterality Date  . BREAST DUCTAL SYSTEM EXCISION Bilateral 10/12/2017   Procedure: EXCISION OF BILATERAL CENTRAL DUCT;  Surgeon: Erroll Luna, MD;  Location: [place];  Service: General;  Laterality: Bilateral;  . BREAST EXCISIONAL BIOPSY    . CHOLECYSTECTOMY N/A 12/21/2015   Procedure: LAPAROSCOPIC CHOLECYSTECTOMY;  Surgeon: Georganna Skeans, MD;   Location: [place];  Service: General;  Laterality: N/A;  . UMBILICAL HERNIA REPAIR N/A 12/27/2015   Procedure: INCARCERATED UMBILICAL HERNIA REPAIR;  Surgeon: Coralie Keens, MD;  Location: [place];  Service: General;  Laterality: N/A;    Social History   Socioeconomic History  . Marital status: Married    Spouse name: Taneah Masri  . Number of children: 2  . Years of education: Not on file  . Highest education level: Not on file  Occupational History  . Occupation: Stay at [employer]: unemploed  Social Needs  . Financial resource strain: Not on file  . Food insecurity:    Worry: Not on file    Inability: Not on file  . Transportation needs:    Medical: Not on file    Non-medical: Not on file  Tobacco Use  . Smoking status: Current Every Day Smoker    Packs/day: 0.25    Years: 24.00    Pack years: 6.00    Types: Cigarettes  . Smokeless tobacco: Never Used  Substance and Sexual Activity  . Alcohol use: Yes    Alcohol/week: 2.0 standard drinks    Types: 2 Shots of liquor per week    Comment: per week  . Drug use: Yes    Types: Marijuana  . Sexual activity: Yes    Partners: Male    Birth control/protection: None  Lifestyle  . Physical activity:    Days per week: Not on file    Minutes per session: Not on file  . Stress: Not on file  Relationships  . Social connections:    Talks on phone: Not on file    Gets together: Not on file    Attends religious service: Not on file    Active member of club or organization: Not on file    Attends meetings of clubs or organizations: Not on file    Relationship status: Not on file  Other Topics Concern  . Not on file  Social History Narrative   Lives with husband, Berneta Sages.    Family History  Problem Relation Age of Onset  . Arthritis Mother   . High blood pressure Mother   . Sleep apnea Mother   . High blood pressure Father   . Breast cancer Paternal Aunt   . Arthritis Sister   . Healthy Daughter   . Healthy Son    . Adrenal disorder Neg Hx   . Colon cancer Neg Hx   . Esophageal cancer Neg Hx   . Pancreatic cancer Neg Hx   . Stomach cancer Neg Hx   . Liver disease Neg Hx     Review of Systems  Constitutional: Negative for chills and fever.  Respiratory: Negative for cough, shortness of breath and wheezing.   Cardiovascular:  Negative for chest pain, palpitations and leg swelling.  Neurological: Negative for dizziness, light-headedness and headaches.       Objective:   Vitals:   01/23/19 0744  BP: 116/70  Pulse: 73  Resp: 16  Temp: 98.5 F (36.9 C)  SpO2: 99%   BP Readings from Last 3 Encounters:  01/23/19 116/70  01/19/19 122/82  01/02/19 138/84   Wt Readings from Last 3 Encounters:  01/23/19 291 lb 6.4 oz (132.2 kg)  01/19/19 286 lb 3.2 oz (129.8 kg)  01/02/19 287 lb (130.2 kg)   Body mass index is 42.18 kg/m.   Physical Exam    Constitutional: Appears well-developed and well-nourished. No distress.  HENT:  Head: Normocephalic and atraumatic.  Neck: Neck supple. No tracheal deviation present. No thyromegaly present.  No cervical lymphadenopathy Cardiovascular: Normal rate, regular rhythm and normal heart sounds.   No murmur heard. No carotid bruit .  No edema Pulmonary/Chest: Effort normal and breath sounds normal. No respiratory distress. No has no wheezes. No rales.  Skin: Skin is warm and dry. Not diaphoretic.  Psychiatric: Normal mood and affect. Behavior is normal.      Assessment & Plan:    See Problem List for Assessment and Plan of chronic medical problems.

## 2019-01-23 ENCOUNTER — Ambulatory Visit: Payer: BLUE CROSS/BLUE SHIELD | Admitting: Internal Medicine

## 2019-01-23 ENCOUNTER — Encounter: Payer: Self-pay | Admitting: Internal Medicine

## 2019-01-23 DIAGNOSIS — I10 Essential (primary) hypertension: Secondary | ICD-10-CM | POA: Diagnosis not present

## 2019-01-23 DIAGNOSIS — M170 Bilateral primary osteoarthritis of knee: Secondary | ICD-10-CM | POA: Diagnosis not present

## 2019-01-23 MED ORDER — PHENTERMINE HCL 37.5 MG PO CAPS: 37.5000 mg | ORAL_CAPSULE | ORAL | 0 refills | Status: DC

## 2019-01-23 NOTE — Assessment & Plan Note (Signed)
Severe arthritis - needs TKR  And following with ortho -- needs to lose weight to have surgery - needs to be around 250 Increase walking as tolerated Decrease portions Will continue phentermine

## 2019-01-23 NOTE — Assessment & Plan Note (Signed)
BP well controlled Current regimen effective and well tolerated Continue current medications at current doses  

## 2019-01-23 NOTE — Assessment & Plan Note (Addendum)
On phentermine for one month - no side effects and BP is good Has lost a few pounds, but needs to lose 40 more lbs to have knee surgery Trying to walk more Continue to decrease portions - decrease carbs/sugars Avoid snacking Will continue phentermine since no other medications are not covered - will prescribe for 4 months total Has seen a nutritionist in the past and deferred referral now

## 2019-01-25 ENCOUNTER — Ambulatory Visit
Admission: RE | Admit: 2019-01-25 | Discharge: 2019-01-25 | Disposition: A | Discharge status: Home / Self Care | Payer: BLUE CROSS/BLUE SHIELD | Source: Ambulatory Visit | Attending: Internal Medicine | Admitting: Internal Medicine

## 2019-01-25 ENCOUNTER — Other Ambulatory Visit: Payer: Self-pay | Admitting: Internal Medicine

## 2019-01-25 DIAGNOSIS — R928 Other abnormal and inconclusive findings on diagnostic imaging of breast: Secondary | ICD-10-CM | POA: Diagnosis not present

## 2019-01-25 DIAGNOSIS — N644 Mastodynia: Secondary | ICD-10-CM

## 2019-01-25 DIAGNOSIS — N611 Abscess of the breast and nipple: Secondary | ICD-10-CM | POA: Diagnosis not present

## 2019-01-27 ENCOUNTER — Telehealth: Payer: Self-pay | Admitting: Obstetrics and Gynecology

## 2019-01-27 NOTE — Telephone Encounter (Signed)
Patient cancelled her recheck appointment for 3/4. She does not think she need to come in.

## 2019-01-27 NOTE — Telephone Encounter (Signed)
Spoke with patient. Started gabapentin on 01/02/19 for vasomotor symptoms, 4 wk f/u recommended.  Patient is still taking gabapentin, states she thought f/u was for colpo, agreeable to rescheduling f/u OV.   OV scheduled for 3/4 at 9:15am with Dr. Talbert Nan.   Routing to provider for final review. Patient is agreeable to disposition. Will close encounter.

## 2019-01-30 ENCOUNTER — Other Ambulatory Visit: Payer: Self-pay | Admitting: Internal Medicine

## 2019-01-30 DIAGNOSIS — N63 Unspecified lump in unspecified breast: Secondary | ICD-10-CM

## 2019-01-30 NOTE — Progress Notes (Signed)
GYNECOLOGY  VISIT   HPI: 48 y.o.   Married Black or [place] American Not Hispanic or Latino  female   [phone] with Patient's last menstrual period was 11/30/2017 (within weeks).   here for follow up on gabapentin. She was started on gabapentin for her vasomotor symptoms last month, she is currently just taking 200 mg of gabapentin at night. Definitely helping, some nights she feels like she could use some more. No side effects. She is having some hot flashes, tolerable.   GYNECOLOGIC HISTORY: Patient's last menstrual period was 11/30/2017 (within weeks). Contraception:none Menopausal hormone therapy: patient is taking Gabapentin for vasomotor symptoms        OB History    Gravida  2   Para  2   Term      Preterm  2   AB      Living  2     SAB      TAB      Ectopic      Multiple      Live Births  2              Patient Active Problem List   Diagnosis Date Noted  . Primary osteoarthritis of both hips 07/29/2018  . Primary osteoarthritis of both knees 07/29/2018  . Primary osteoarthritis of both feet 07/29/2018  . Tonsillitis 05/02/2018  . Hypokalemia 09/03/2017  . Acute prerenal failure ([place]) 09/03/2017  . OSA (obstructive sleep apnea) 12/10/2016  . GERD (gastroesophageal reflux disease) 12/04/2016  . Hypersomnia 10/29/2016  . Back pain 08/21/2016  . Chronic pain of multiple joints 08/13/2016  . MGUS (monoclonal gammopathy of unknown significance) 07/02/2016  . Vitamin D deficiency 07/02/2016  . Diarrhea 07/02/2016  . Joint pain 04/03/2016  . HTN (hypertension), benign 11/11/2015  . Morbid obesity ([place]) 11/11/2015  . Adrenal mass ([place]) 08/30/2015  . Ovarian cyst 08/30/2015    Past Medical History:  Diagnosis Date  . Arthritis   . Gallstones   . GERD (gastroesophageal reflux disease)   . Hypertension   . MGUS (monoclonal gammopathy of unknown significance)   . Obesity   . Osteoarthritis   . PONV (postoperative nausea and vomiting)   . Rheumatoid  arthritis ([place])   . Sleep apnea    SENT CPAP BACK, NOT WORN IN 7-8 MONTHS    Past Surgical History:  Procedure Laterality Date  . BREAST DUCTAL SYSTEM EXCISION Bilateral 10/12/2017   Procedure: EXCISION OF BILATERAL CENTRAL DUCT;  Surgeon: Erroll Luna, MD;  Location: [facility];  Service: General;  Laterality: Bilateral;  . BREAST EXCISIONAL BIOPSY    . CHOLECYSTECTOMY N/A 12/21/2015   Procedure: LAPAROSCOPIC CHOLECYSTECTOMY;  Surgeon: Georganna Skeans, MD;  Location: [facility];  Service: General;  Laterality: N/A;  . UMBILICAL HERNIA REPAIR N/A 12/27/2015   Procedure: INCARCERATED UMBILICAL HERNIA REPAIR;  Surgeon: Coralie Keens, MD;  Location: [facility];  Service: General;  Laterality: N/A;    Current Outpatient Medications  Medication Sig Dispense Refill  . amLODipine (NORVASC) 10 MG tablet Take 1 tablet (10 mg total) by mouth daily. Needs office visit for more refills 90 tablet 0  . folic acid (FOLVITE) 1 MG tablet Take 2 tablets (2 mg total) by mouth daily. 180 tablet 3  . gabapentin (NEURONTIN) 100 MG capsule One tablet po qhs x 3 nights, if tolerating can increase to 2 tablets po qhs x 3 nights, if tolerating can increase to 3 tablets po qhs 90 capsule 1  . hydrochlorothiazide (HYDRODIURIL) 25 MG tablet Take  1 tablet (25 mg total) by mouth daily. Needs office visit for more refills 90 tablet 0  . lisinopril (PRINIVIL,ZESTRIL) 40 MG tablet Take 1 tablet (40 mg total) by mouth daily. Needs OV for more refills 90 tablet 0  . methotrexate (RHEUMATREX) 2.5 MG tablet Take 6 tablets (15 mg total) by mouth once a week. Caution:Chemotherapy. Protect from light. 72 tablet 0  . phentermine 37.5 MG capsule Take 1 capsule (37.5 mg total) by mouth every morning. 30 capsule 0  . tizanidine (ZANAFLEX) 6 MG capsule Take 1 capsule (6 mg total) by mouth 3 (three) times daily as needed for muscle spasms. 90 capsule 5   No current facility-administered medications for this visit.      ALLERGIES: Patient has no  known allergies.  Family History  Problem Relation Age of Onset  . Arthritis Mother   . High blood pressure Mother   . Sleep apnea Mother   . High blood pressure Father   . Breast cancer Paternal Aunt   . Arthritis Sister   . Healthy Daughter   . Healthy Son   . Adrenal disorder Neg Hx   . Colon cancer Neg Hx   . Esophageal cancer Neg Hx   . Pancreatic cancer Neg Hx   . Stomach cancer Neg Hx   . Liver disease Neg Hx     Social History   Socioeconomic History  . Marital status: Married    Spouse name: Ruby Logiudice  . Number of children: 2  . Years of education: Not on file  . Highest education level: Not on file  Occupational History  . Occupation: Stay at [employer]: unemploed  Social Needs  . Financial resource strain: Not on file  . Food insecurity:    Worry: Not on file    Inability: Not on file  . Transportation needs:    Medical: Not on file    Non-medical: Not on file  Tobacco Use  . Smoking status: Current Every Day Smoker    Packs/day: 0.25    Years: 24.00    Pack years: 6.00    Types: Cigarettes  . Smokeless tobacco: Never Used  Substance and Sexual Activity  . Alcohol use: Yes    Alcohol/week: 2.0 standard drinks    Types: 2 Shots of liquor per week    Comment: per week  . Drug use: Yes    Types: Marijuana  . Sexual activity: Yes    Partners: Male    Birth control/protection: None  Lifestyle  . Physical activity:    Days per week: Not on file    Minutes per session: Not on file  . Stress: Not on file  Relationships  . Social connections:    Talks on phone: Not on file    Gets together: Not on file    Attends religious service: Not on file    Active member of club or organization: Not on file    Attends meetings of clubs or organizations: Not on file    Relationship status: Not on file  . Intimate partner violence:    Fear of current or ex partner: Not on file    Emotionally abused: Not on file    Physically abused: Not on file     Forced sexual activity: Not on file  Other Topics Concern  . Not on file  Social History Narrative   Lives with husband, Berneta Sages.    Review of Systems  Constitutional: Negative.  HENT: Negative.   Eyes: Negative.   Respiratory: Negative.   Cardiovascular: Negative.   Gastrointestinal: Negative.   Genitourinary: Negative.   Musculoskeletal: Negative.   Skin: Negative.   Neurological: Negative.   Endo/Heme/Allergies: Negative.   Psychiatric/Behavioral: Negative.     PHYSICAL EXAMINATION:    BP 118/72 (BP Location: Right Arm, Patient Position: Sitting, Cuff Size: Large)   Pulse 72   Resp 16   Wt 290 lb (131.5 kg)   LMP 11/30/2017 (Within Weeks)   BMI 41.98 kg/m     General appearance: alert, cooperative and appears stated age  ASSESSMENT Vasomotor symptoms, improved with gabapentin    PLAN Will take 300 mg of gabapentin qhs Call with any concerns   An After Visit Summary was printed and given to the patient.

## 2019-02-01 ENCOUNTER — Ambulatory Visit: Payer: BLUE CROSS/BLUE SHIELD | Admitting: Obstetrics and Gynecology

## 2019-02-01 ENCOUNTER — Encounter: Payer: Self-pay | Admitting: Obstetrics and Gynecology

## 2019-02-01 VITALS — BP 118/72 | HR 72 | Resp 16 | Wt 290.0 lb

## 2019-02-01 DIAGNOSIS — N951 Menopausal and female climacteric states: Secondary | ICD-10-CM | POA: Diagnosis not present

## 2019-02-01 MED ORDER — GABAPENTIN 300 MG PO CAPS: 300.0000 mg | ORAL_CAPSULE | Freq: Every day | ORAL | 3 refills | Status: DC

## 2019-02-02 ENCOUNTER — Ambulatory Visit (INDEPENDENT_AMBULATORY_CARE_PROVIDER_SITE_OTHER): Payer: BLUE CROSS/BLUE SHIELD | Admitting: Rheumatology

## 2019-02-02 ENCOUNTER — Encounter: Payer: Self-pay | Admitting: Rheumatology

## 2019-02-02 ENCOUNTER — Other Ambulatory Visit: Payer: BLUE CROSS/BLUE SHIELD

## 2019-02-02 VITALS — BP 128/90 | HR 71 | Resp 16 | Ht 68.0 in | Wt 289.6 lb

## 2019-02-02 DIAGNOSIS — M160 Bilateral primary osteoarthritis of hip: Secondary | ICD-10-CM

## 2019-02-02 DIAGNOSIS — M19071 Primary osteoarthritis, right ankle and foot: Secondary | ICD-10-CM

## 2019-02-02 DIAGNOSIS — R7612 Nonspecific reaction to cell mediated immunity measurement of gamma interferon antigen response without active tuberculosis: Secondary | ICD-10-CM

## 2019-02-02 DIAGNOSIS — D472 Monoclonal gammopathy: Secondary | ICD-10-CM

## 2019-02-02 DIAGNOSIS — Z79899 Other long term (current) drug therapy: Secondary | ICD-10-CM | POA: Diagnosis not present

## 2019-02-02 DIAGNOSIS — M0579 Rheumatoid arthritis with rheumatoid factor of multiple sites without organ or systems involvement: Secondary | ICD-10-CM | POA: Diagnosis not present

## 2019-02-02 DIAGNOSIS — M19072 Primary osteoarthritis, left ankle and foot: Secondary | ICD-10-CM

## 2019-02-02 DIAGNOSIS — E559 Vitamin D deficiency, unspecified: Secondary | ICD-10-CM

## 2019-02-02 DIAGNOSIS — M170 Bilateral primary osteoarthritis of knee: Secondary | ICD-10-CM

## 2019-02-02 DIAGNOSIS — G4733 Obstructive sleep apnea (adult) (pediatric): Secondary | ICD-10-CM

## 2019-02-02 DIAGNOSIS — I10 Essential (primary) hypertension: Secondary | ICD-10-CM

## 2019-02-02 DIAGNOSIS — K219 Gastro-esophageal reflux disease without esophagitis: Secondary | ICD-10-CM

## 2019-02-03 ENCOUNTER — Other Ambulatory Visit: Payer: BLUE CROSS/BLUE SHIELD

## 2019-02-03 LAB — COMPLETE METABOLIC PANEL WITH GFR
AG Ratio: 1.1 (calc) (ref 1.0–2.5)
ALT: 8 U/L (ref 6–29)
AST: 16 U/L (ref 10–35)
Albumin: 3.5 g/dL — ABNORMAL LOW (ref 3.6–5.1)
Alkaline phosphatase (APISO): 65 U/L (ref 31–125)
BILIRUBIN TOTAL: 0.3 mg/dL (ref 0.2–1.2)
BUN: 22 mg/dL (ref 7–25)
CHLORIDE: 108 mmol/L (ref 98–110)
CO2: 28 mmol/L (ref 20–32)
Calcium: 9.1 mg/dL (ref 8.6–10.2)
Creat: 1.08 mg/dL (ref 0.50–1.10)
GFR, Est African American: 70 mL/min/{1.73_m2} (ref >=60)
GFR, Est Non African American: 61 mL/min/{1.73_m2} (ref >=60)
Globulin: 3.2 g/dL (calc) (ref 1.9–3.7)
Glucose, Bld: 73 mg/dL (ref 65–99)
Potassium: 3.9 mmol/L (ref 3.5–5.3)
Sodium: 143 mmol/L (ref 135–146)
Total Protein: 6.7 g/dL (ref 6.1–8.1)

## 2019-02-03 LAB — CBC WITH DIFFERENTIAL/PLATELET
Absolute Monocytes: 581 cells/uL (ref 200–950)
BASOS ABS: 61 {cells}/uL (ref 0–200)
Basophils Relative: 0.6 %
Eosinophils Absolute: 153 cells/uL (ref 15–500)
Eosinophils Relative: 1.5 %
HEMATOCRIT: 37.7 % (ref 35.0–45.0)
Hemoglobin: 12.6 g/dL (ref 11.7–15.5)
LYMPHS ABS: 4478 {cells}/uL — AB (ref 850–3900)
MCH: 33.2 pg — ABNORMAL HIGH (ref 27.0–33.0)
MCHC: 33.4 g/dL (ref 32.0–36.0)
MCV: 99.2 fL (ref 80.0–100.0)
MPV: 9.7 fL (ref 7.5–12.5)
Monocytes Relative: 5.7 %
NEUTROS ABS: 4927 {cells}/uL (ref 1500–7800)
NEUTROS PCT: 48.3 %
Platelets: 344 10*3/uL (ref 140–400)
RBC: 3.8 10*6/uL (ref 3.80–5.10)
RDW: 12.8 % (ref 11.0–15.0)
Total Lymphocyte: 43.9 %
WBC: 10.2 10*3/uL (ref 3.8–10.8)

## 2019-02-03 NOTE — Progress Notes (Signed)
Labs are stable.

## 2019-03-14 ENCOUNTER — Ambulatory Visit: Payer: BLUE CROSS/BLUE SHIELD | Admitting: Internal Medicine

## 2019-03-24 ENCOUNTER — Other Ambulatory Visit: Payer: Self-pay | Admitting: Internal Medicine

## 2019-04-04 ENCOUNTER — Telehealth: Payer: Self-pay | Admitting: Obstetrics and Gynecology

## 2019-04-04 NOTE — Telephone Encounter (Signed)
She needs to be seen, please put her on the schedule in the next few days.

## 2019-04-04 NOTE — Telephone Encounter (Signed)
Patient stated that she is perimenopausal. Patient has not had a cycle in over a year. Patient started bleeding 5/3 and is experiencing cramping. Patient stated that she is changing a pad every 2-3 hours.

## 2019-04-04 NOTE — Telephone Encounter (Signed)
Spoke with patient. Patient reports cramping and spotting that started on 5/3, bleeding has increased to "heavy flow". Changing satirated pad q2-3 hrs. LMP was over one year ago. Pain 8/10, took 2 OTC midol this morning, pain reduced to 2/10. Denies N/V, fever/chills, vaginal odor or d/c. Takes gabapentin for vasomotor symptoms. Advised I will review with Dr. Talbert Nan and return call. Patient states she is unavailable for OV today.   Dr. Talbert Nan -please review and advise.

## 2019-04-04 NOTE — Telephone Encounter (Signed)
Spoke with patient, advised as seen below per Dr. Talbert Nan. OV scheduled for 5/7 at 9am with Dr. Talbert Nan. Patient declined earlier OV offered, is aware to return call if symptoms worsen or bleeding becomes heavy, changing pad q1-2hrs.   Routing to provider for final review. Patient is agreeable to disposition. Will close encounter.

## 2019-04-06 ENCOUNTER — Encounter: Payer: Self-pay | Admitting: Obstetrics and Gynecology

## 2019-04-06 ENCOUNTER — Ambulatory Visit (INDEPENDENT_AMBULATORY_CARE_PROVIDER_SITE_OTHER): Payer: BLUE CROSS/BLUE SHIELD | Admitting: Obstetrics and Gynecology

## 2019-04-06 ENCOUNTER — Other Ambulatory Visit: Payer: Self-pay

## 2019-04-06 VITALS — BP 130/90 | HR 80 | Temp 98.3°F | Ht 68.0 in | Wt 295.0 lb

## 2019-04-06 DIAGNOSIS — R102 Pelvic and perineal pain: Secondary | ICD-10-CM

## 2019-04-06 DIAGNOSIS — N950 Postmenopausal bleeding: Secondary | ICD-10-CM | POA: Diagnosis not present

## 2019-04-06 NOTE — Progress Notes (Signed)
GYNECOLOGY  VISIT   HPI: 48 y.o.   Married Black or [place] American Not Hispanic or Latino  female   [phone] with Patient's last menstrual period was 04/02/2019 (exact date).   here for postmenopausal vaginal bleeding. She started bleeding on 5/3, initially spotting, then it got heavy for 1-2 days, now getting lighter. At the most she was saturating a pad in 2 hours. Some lower abdominal pain, radiating to her upper pain. The pain is cramping, intermittent, ranging from a 2-8/10 in severity. Midol helps. Sexually active without pain, she wasn't sexually active just prior to the bleeding starting. She is taking gabapentin at night for night sweats.  No bowel or bladder c/o.    GYNECOLOGIC HISTORY: Patient's last menstrual period was 04/02/2019 (exact date). Contraception: none Menopausal hormone therapy: none        OB History    Gravida  2   Para  2   Term      Preterm  2   AB      Living  2     SAB      TAB      Ectopic      Multiple      Live Births  2              Patient Active Problem List   Diagnosis Date Noted  . Primary osteoarthritis of both hips 07/29/2018  . Primary osteoarthritis of both knees 07/29/2018  . Primary osteoarthritis of both feet 07/29/2018  . Tonsillitis 05/02/2018  . Hypokalemia 09/03/2017  . Acute prerenal failure ([place]) 09/03/2017  . OSA (obstructive sleep apnea) 12/10/2016  . GERD (gastroesophageal reflux disease) 12/04/2016  . Hypersomnia 10/29/2016  . Back pain 08/21/2016  . Chronic pain of multiple joints 08/13/2016  . MGUS (monoclonal gammopathy of unknown significance) 07/02/2016  . Vitamin D deficiency 07/02/2016  . Diarrhea 07/02/2016  . Joint pain 04/03/2016  . HTN (hypertension), benign 11/11/2015  . Morbid obesity ([place]) 11/11/2015  . Adrenal mass ([place]) 08/30/2015  . Ovarian cyst 08/30/2015    Past Medical History:  Diagnosis Date  . Arthritis   . Gallstones   . GERD (gastroesophageal reflux disease)   .  Hypertension   . MGUS (monoclonal gammopathy of unknown significance)   . Obesity   . Osteoarthritis   . PONV (postoperative nausea and vomiting)   . Rheumatoid arthritis ([place])   . Sleep apnea    SENT CPAP BACK, NOT WORN IN 7-8 MONTHS    Past Surgical History:  Procedure Laterality Date  . BREAST DUCTAL SYSTEM EXCISION Bilateral 10/12/2017   Procedure: EXCISION OF BILATERAL CENTRAL DUCT;  Surgeon: Erroll Luna, MD;  Location: [facility];  Service: General;  Laterality: Bilateral;  . BREAST EXCISIONAL BIOPSY    . CHOLECYSTECTOMY N/A 12/21/2015   Procedure: LAPAROSCOPIC CHOLECYSTECTOMY;  Surgeon: Georganna Skeans, MD;  Location: [facility];  Service: General;  Laterality: N/A;  . UMBILICAL HERNIA REPAIR N/A 12/27/2015   Procedure: INCARCERATED UMBILICAL HERNIA REPAIR;  Surgeon: Coralie Keens, MD;  Location: [facility];  Service: General;  Laterality: N/A;    Current Outpatient Medications  Medication Sig Dispense Refill  . amLODipine (NORVASC) 10 MG tablet Take 1 tablet (10 mg total) by mouth daily. Needs office visit for more refills 90 tablet 0  . gabapentin (NEURONTIN) 300 MG capsule Take 1 capsule (300 mg total) by mouth at bedtime. 90 capsule 3  . hydrochlorothiazide (HYDRODIURIL) 25 MG tablet Take 1 tablet (25 mg total) by mouth daily.  Needs office visit for more refills 90 tablet 0  . ibuprofen (ADVIL) 200 MG tablet Take 200 mg by mouth every 6 (six) hours as needed.    Marland Kitchen lisinopril (PRINIVIL,ZESTRIL) 40 MG tablet Take 1 tablet (40 mg total) by mouth daily. Needs OV for more refills 90 tablet 0  . phentermine 37.5 MG capsule TAKE 1 CAPSULE BY MOUTH EVERY MORNING 30 capsule 0  . tizanidine (ZANAFLEX) 6 MG capsule Take 1 capsule (6 mg total) by mouth 3 (three) times daily as needed for muscle spasms. 90 capsule 5   No current facility-administered medications for this visit.      ALLERGIES: Patient has no known allergies.  Family History  Problem Relation Age of Onset  . Arthritis Mother    . High blood pressure Mother   . Sleep apnea Mother   . High blood pressure Father   . Breast cancer Paternal Aunt   . Arthritis Sister   . Healthy Daughter   . Healthy Son   . Adrenal disorder Neg Hx   . Colon cancer Neg Hx   . Esophageal cancer Neg Hx   . Pancreatic cancer Neg Hx   . Stomach cancer Neg Hx   . Liver disease Neg Hx     Social History   Socioeconomic History  . Marital status: Married    Spouse name: Kamsiyochukwu Buist  . Number of children: 2  . Years of education: Not on file  . Highest education level: Not on file  Occupational History  . Occupation: Stay at [employer]: unemploed  Social Needs  . Financial resource strain: Not on file  . Food insecurity:    Worry: Not on file    Inability: Not on file  . Transportation needs:    Medical: Not on file    Non-medical: Not on file  Tobacco Use  . Smoking status: Current Every Day Smoker    Packs/day: 0.25    Years: 24.00    Pack years: 6.00    Types: Cigarettes  . Smokeless tobacco: Never Used  Substance and Sexual Activity  . Alcohol use: Yes    Alcohol/week: 2.0 standard drinks    Types: 2 Shots of liquor per week    Comment: per week  . Drug use: Yes    Types: Marijuana  . Sexual activity: Yes    Partners: Male    Birth control/protection: None  Lifestyle  . Physical activity:    Days per week: Not on file    Minutes per session: Not on file  . Stress: Not on file  Relationships  . Social connections:    Talks on phone: Not on file    Gets together: Not on file    Attends religious service: Not on file    Active member of club or organization: Not on file    Attends meetings of clubs or organizations: Not on file    Relationship status: Not on file  . Intimate partner violence:    Fear of current or ex partner: Not on file    Emotionally abused: Not on file    Physically abused: Not on file    Forced sexual activity: Not on file  Other Topics Concern  . Not on file  Social  History Narrative   Lives with husband, Berneta Sages.    Review of Systems  Constitutional: Negative.   HENT: Negative.   Eyes: Negative.   Respiratory: Negative.   Cardiovascular: Negative.  Gastrointestinal: Positive for abdominal pain.  Genitourinary:       Painful periods Unscheduled bleeding   Musculoskeletal: Negative.   Skin: Negative.   Neurological: Negative.   Endo/Heme/Allergies: Negative.   Psychiatric/Behavioral: Negative.   All other systems reviewed and are negative.   PHYSICAL EXAMINATION:    BP 130/90   Pulse 80   Temp 98.3 F (36.8 C) (Oral)   Ht 5\' 8"  (1.727 m)   Wt 295 lb (133.8 kg)   LMP 04/02/2019 (Exact Date)   BMI 44.85 kg/m     General appearance: alert, cooperative and appears stated age Abdomen: soft, non-tender; non distended, no masses,  no organomegaly  Pelvic: External genitalia:  no lesions              Urethra:  normal appearing urethra with no masses, tenderness or lesions              Bartholins and Skenes: normal                 Vagina: normal appearing vagina with normal color and discharge, no lesions              Cervix: no cervical motion tenderness and no lesions              Bimanual Exam:  Uterus:  anteverted and anteverted, mobile, mildly tender, normal sized              Adnexa: no masses, tender in the right adnexa                The risks of endometrial biopsy were reviewed and a consent was obtained.  A speculum was placed in the vagina and the cervix was cleansed with betadine. A  mini-pipelle was placed into the endometrial cavity. The uterus sounded to ~7-8 cm. The endometrial biopsy was performed, moderate tissue was obtained. The tenaculum and speculum were removed. There were no complications.    Chaperone was present for exam.  ASSESSMENT Postmenopausal bleeding Pelvic pain Vasomotor symptoms, improved on higher dose of gabapentin    PLAN Endometrial biopsy done Return for GYN ultrasound Continue gabapentin  300 mg qhs   An After Visit Summary was printed and given to the patient.

## 2019-04-06 NOTE — Patient Instructions (Signed)

## 2019-04-11 ENCOUNTER — Telehealth: Payer: Self-pay | Admitting: Obstetrics and Gynecology

## 2019-04-11 NOTE — Telephone Encounter (Signed)
Spoke with patient. Patient is ready to schedule PUS appointment. Needs an early morning appointment. Appointment scheduled for 04/20/2019 at 9 am with 9:30 am consult with Dr.Jertson. Patient is agreeable to date and time.  Routing to provider and will close encounter.

## 2019-04-11 NOTE — Telephone Encounter (Signed)
Patient calling to schedule ultrasound appointment.

## 2019-04-17 ENCOUNTER — Telehealth: Payer: Self-pay | Admitting: Obstetrics and Gynecology

## 2019-04-17 NOTE — Telephone Encounter (Signed)
Patient stated that she went without a cycle for over a year and began bleeding again. She was seen by Dr. Talbert Nan, and scheduled an ultrasound. Patient is calling because she is scheduled for an ultrasound on Thursday, but started bleeding again yesterday.

## 2019-04-17 NOTE — Telephone Encounter (Signed)
Call reviewed with Dr. Talbert Nan, no additional recommendations.   Routing to provider for final review. Patient is agreeable to disposition. Will close encounter.

## 2019-04-17 NOTE — Telephone Encounter (Signed)
Spoke with patient. Seen in office on 04/06/19 for PMB, EMB normal, is scheduled for PUS on 5/21. Bleeding stopped on 5/9, restarted on 5/17. Describes bleeding as light flow with cramping. Cramping 8/10, took 3 midol this morning, pain now 2/10. Vomited x1 prior to taking midol. Was able to eat breakfast. Denies nausea, diarrhea,  fever/chills.   Advised patient to continue to monitor menses, keep PUS as scheduled for further evaluation. Continue OTC motrin or midol for cramping, if pain is severe, bleeding becomes heavy, changing saturated pad q1-2 hrs or new symptoms develop, return call to office or ER for further evaluation if after hours. Advised Dr. Talbert Nan will review, our office will return call with any additional recommendations.   Routing to Dr. Talbert Nan

## 2019-04-20 ENCOUNTER — Other Ambulatory Visit: Payer: Self-pay

## 2019-04-20 ENCOUNTER — Ambulatory Visit (INDEPENDENT_AMBULATORY_CARE_PROVIDER_SITE_OTHER): Payer: BLUE CROSS/BLUE SHIELD

## 2019-04-20 ENCOUNTER — Ambulatory Visit (INDEPENDENT_AMBULATORY_CARE_PROVIDER_SITE_OTHER): Payer: BLUE CROSS/BLUE SHIELD | Admitting: Obstetrics and Gynecology

## 2019-04-20 ENCOUNTER — Encounter: Payer: Self-pay | Admitting: Obstetrics and Gynecology

## 2019-04-20 VITALS — BP 140/90 | Temp 97.3°F | Wt 296.4 lb

## 2019-04-20 DIAGNOSIS — N950 Postmenopausal bleeding: Secondary | ICD-10-CM

## 2019-04-20 DIAGNOSIS — R102 Pelvic and perineal pain: Secondary | ICD-10-CM

## 2019-04-20 DIAGNOSIS — N83202 Unspecified ovarian cyst, left side: Secondary | ICD-10-CM

## 2019-04-20 MED ORDER — NAPROXEN SODIUM 550 MG PO TABS: 550.0000 mg | ORAL_TABLET | Freq: Two times a day (BID) | ORAL | 1 refills | Status: DC

## 2019-04-20 MED ORDER — MEDROXYPROGESTERONE ACETATE 10 MG PO TABS: ORAL_TABLET | ORAL | 0 refills | Status: DC

## 2019-04-20 NOTE — Patient Instructions (Signed)
Ovarian Cyst         An ovarian cyst is a fluid-filled sac that forms on an ovary. The ovaries are small organs that produce eggs in women. Various types of cysts can form on the ovaries. Some may cause symptoms and require treatment. Most ovarian cysts go away on their own, are not cancerous (are benign), and do not cause problems.  Common types of ovarian cysts include:  · Functional (follicle) cysts.  ? Occur during the menstrual cycle, and usually go away with the next menstrual cycle if you do not get pregnant.  ? Usually cause no symptoms.  · Endometriomas.  ? Are cysts that form from the tissue that lines the uterus (endometrium).  ? Are sometimes called “chocolate cysts” because they become filled with blood that turns brown.  ? Can cause pain in the lower abdomen during intercourse and during your period.  · Cystadenoma cysts.  ? Develop from cells on the outside surface of the ovary.  ? Can get very large and cause lower abdomen pain and pain with intercourse.  ? Can cause severe pain if they twist or break open (rupture).  · Dermoid cysts.  ? Are sometimes found in both ovaries.  ? May contain different kinds of body tissue, such as skin, teeth, hair, or cartilage.  ? Usually do not cause symptoms unless they get very big.  · Theca lutein cysts.  ? Occur when too much of a certain hormone (human chorionic gonadotropin) is produced and overstimulates the ovaries to produce an egg.  ? Are most common after having procedures used to assist with the conception of a baby (in vitro fertilization).  What are the causes?  Ovarian cysts may be caused by:  · Ovarian hyperstimulation syndrome. This is a condition that can develop from taking fertility medicines. It causes multiple large ovarian cysts to form.  · Polycystic ovarian syndrome (PCOS). This is a common hormonal disorder that can cause ovarian cysts, as well as problems with your period or fertility.  What increases the risk?  The following factors may  make you more likely to develop ovarian cysts:  · Being overweight or obese.  · Taking fertility medicines.  · Taking certain forms of hormonal birth control.  · Smoking.  What are the signs or symptoms?  Many ovarian cysts do not cause symptoms. If symptoms are present, they may include:  · Pelvic pain or pressure.  · Pain in the lower abdomen.  · Pain during sex.  · Abdominal swelling.  · Abnormal menstrual periods.  · Increasing pain with menstrual periods.  How is this diagnosed?  These cysts are commonly found during a routine pelvic exam. You may have tests to find out more about the cyst, such as:  · Ultrasound.  · X-ray of the pelvis.  · CT scan.  · MRI.  · Blood tests.  How is this treated?  Many ovarian cysts go away on their own without treatment. Your health care provider may want to check your cyst regularly for 2-3 months to see if it changes. If you are in menopause, it is especially important to have your cyst monitored closely because menopausal women have a higher rate of ovarian cancer.  When treatment is needed, it may include:  · Medicines to help relieve pain.  · A procedure to drain the cyst (aspiration).  · Surgery to remove the whole cyst.  · Hormone treatment or birth control pills. These methods are sometimes used   to help dissolve a cyst.  Follow these instructions at home:  · Take over-the-counter and prescription medicines only as told by your health care provider.  · Do not drive or use heavy machinery while taking prescription pain medicine.  · Get regular pelvic exams and Pap tests as often as told by your health care provider.  · Return to your normal activities as told by your health care provider. Ask your health care provider what activities are safe for you.  · Do not use any products that contain nicotine or tobacco, such as cigarettes and e-cigarettes. If you need help quitting, ask your health care provider.  · Keep all follow-up visits as told by your health care provider.  This is important.  Contact a health care provider if:  · Your periods are late, irregular, or painful, or they stop.  · You have pelvic pain that does not go away.  · You have pressure on your bladder or trouble emptying your bladder completely.  · You have pain during sex.  · You have any of the following in your abdomen:  ? A feeling of fullness.  ? Pressure.  ? Discomfort.  ? Pain that does not go away.  ? Swelling.  · You feel generally ill.  · You become constipated.  · You lose your appetite.  · You develop severe acne.  · You start to have more body hair and facial hair.  · You are gaining weight or losing weight without changing your exercise and eating habits.  · You think you may be pregnant.  Get help right away if:  · You have abdominal pain that is severe or gets worse.  · You cannot eat or drink without vomiting.  · You suddenly develop a fever.  · Your menstrual period is much heavier than usual.  This information is not intended to replace advice given to you by your health care provider. Make sure you discuss any questions you have with your health care provider.  Document Released: 11/16/2005 Document Revised: 06/05/2016 Document Reviewed: 04/19/2016  [employer]vier Interactive Patient Education © 2019 [employer]

## 2019-04-20 NOTE — Progress Notes (Signed)
GYNECOLOGY  VISIT   HPI: 48 y.o.   Married Black or [place] American Not Hispanic or Latino  female   [phone] with Patient's last menstrual period was 04/16/2019.   here for f/u of PMP bleeding and pelvic pain. Endometrial biopsy from earlier this month returned with atrophy.  She bleed from 5/3-5/9, started bleeding again on 5/17 and still bleeding. Saturating a pad every 3 hours. She was having some menstrual cramps with the bleeding but that has eased up. Yesterday she started having intermittent sharp pain in her left pelvis. The pain lasts for about 5 minutes at a time, up to a 9/10 in severity. Occurring a couple of times a day. Nothing brings it on, can be just sitting.  She has had some intermittent nausea, thinks related to her acid reflux, vomiting some bile.  She was constipated, took a stool softener and feels better.   Last pap 01/10/19 ASCUS, +HPV, ECC with CIN I  GYNECOLOGIC HISTORY: Patient's last menstrual period was 04/16/2019. Contraception: None Menopausal hormone therapy: None        OB History    Gravida  2   Para  2   Term      Preterm  2   AB      Living  2     SAB      TAB      Ectopic      Multiple      Live Births  2              Patient Active Problem List   Diagnosis Date Noted  . Primary osteoarthritis of both hips 07/29/2018  . Primary osteoarthritis of both knees 07/29/2018  . Primary osteoarthritis of both feet 07/29/2018  . Tonsillitis 05/02/2018  . Hypokalemia 09/03/2017  . Acute prerenal failure ([place]) 09/03/2017  . OSA (obstructive sleep apnea) 12/10/2016  . GERD (gastroesophageal reflux disease) 12/04/2016  . Hypersomnia 10/29/2016  . Back pain 08/21/2016  . Chronic pain of multiple joints 08/13/2016  . MGUS (monoclonal gammopathy of unknown significance) 07/02/2016  . Vitamin D deficiency 07/02/2016  . Diarrhea 07/02/2016  . Joint pain 04/03/2016  . HTN (hypertension), benign 11/11/2015  . Morbid obesity ([place])  11/11/2015  . Adrenal mass ([place]) 08/30/2015  . Ovarian cyst 08/30/2015    Past Medical History:  Diagnosis Date  . Arthritis   . Gallstones   . GERD (gastroesophageal reflux disease)   . Hypertension   . MGUS (monoclonal gammopathy of unknown significance)   . Obesity   . Osteoarthritis   . PONV (postoperative nausea and vomiting)   . Rheumatoid arthritis ([place])   . Sleep apnea    SENT CPAP BACK, NOT WORN IN 7-8 MONTHS    Past Surgical History:  Procedure Laterality Date  . BREAST DUCTAL SYSTEM EXCISION Bilateral 10/12/2017   Procedure: EXCISION OF BILATERAL CENTRAL DUCT;  Surgeon: Erroll Luna, MD;  Location: [facility];  Service: General;  Laterality: Bilateral;  . BREAST EXCISIONAL BIOPSY    . CHOLECYSTECTOMY N/A 12/21/2015   Procedure: LAPAROSCOPIC CHOLECYSTECTOMY;  Surgeon: Georganna Skeans, MD;  Location: [facility];  Service: General;  Laterality: N/A;  . UMBILICAL HERNIA REPAIR N/A 12/27/2015   Procedure: INCARCERATED UMBILICAL HERNIA REPAIR;  Surgeon: Coralie Keens, MD;  Location: [place] [facility];  Service: General;  Laterality: N/A;    Current Outpatient Medications  Medication Sig Dispense Refill  . amLODipine (NORVASC) 10 MG tablet Take 1 tablet (10 mg total) by mouth daily. Needs office visit for  more refills 90 tablet 0  . gabapentin (NEURONTIN) 300 MG capsule Take 1 capsule (300 mg total) by mouth at bedtime. 90 capsule 3  . hydrochlorothiazide (HYDRODIURIL) 25 MG tablet Take 1 tablet (25 mg total) by mouth daily. Needs office visit for more refills 90 tablet 0  . ibuprofen (ADVIL) 200 MG tablet Take 200 mg by mouth every 6 (six) hours as needed.    Marland Kitchen lisinopril (PRINIVIL,ZESTRIL) 40 MG tablet Take 1 tablet (40 mg total) by mouth daily. Needs OV for more refills 90 tablet 0  . phentermine 37.5 MG capsule TAKE 1 CAPSULE BY MOUTH EVERY MORNING 30 capsule 0  . tizanidine (ZANAFLEX) 6 MG capsule Take 1 capsule (6 mg total) by mouth 3 (three) times daily as needed for muscle spasms. 90  capsule 5   No current facility-administered medications for this visit.      ALLERGIES: Patient has no known allergies.  Family History  Problem Relation Age of Onset  . Arthritis Mother   . High blood pressure Mother   . Sleep apnea Mother   . High blood pressure Father   . Breast cancer Paternal Aunt   . Arthritis Sister   . Healthy Daughter   . Healthy Son   . Adrenal disorder Neg Hx   . Colon cancer Neg Hx   . Esophageal cancer Neg Hx   . Pancreatic cancer Neg Hx   . Stomach cancer Neg Hx   . Liver disease Neg Hx     Social History   Socioeconomic History  . Marital status: Married    Spouse name: Davonna Ertl  . Number of children: 2  . Years of education: Not on file  . Highest education level: Not on file  Occupational History  . Occupation: Stay at [employer]: unemploed  Social Needs  . Financial resource strain: Not on file  . Food insecurity:    Worry: Not on file    Inability: Not on file  . Transportation needs:    Medical: Not on file    Non-medical: Not on file  Tobacco Use  . Smoking status: Current Every Day Smoker    Packs/day: 0.25    Years: 24.00    Pack years: 6.00    Types: Cigarettes  . Smokeless tobacco: Never Used  Substance and Sexual Activity  . Alcohol use: Yes    Alcohol/week: 2.0 standard drinks    Types: 2 Shots of liquor per week    Comment: per week  . Drug use: Yes    Types: Marijuana  . Sexual activity: Yes    Partners: Male    Birth control/protection: None  Lifestyle  . Physical activity:    Days per week: Not on file    Minutes per session: Not on file  . Stress: Not on file  Relationships  . Social connections:    Talks on phone: Not on file    Gets together: Not on file    Attends religious service: Not on file    Active member of club or organization: Not on file    Attends meetings of clubs or organizations: Not on file    Relationship status: Not on file  . Intimate partner violence:    Fear  of current or ex partner: Not on file    Emotionally abused: Not on file    Physically abused: Not on file    Forced sexual activity: Not on file  Other Topics Concern  .  Not on file  Social History Narrative   Lives with husband, Berneta Sages.    Review of Systems  Constitutional: Negative.   HENT: Negative.   Eyes: Negative.   Respiratory: Negative.   Cardiovascular: Negative.   Gastrointestinal: Negative.   Genitourinary:       AUB Pelvic pain on the left side-sharp Uterine cramping  Musculoskeletal: Negative.   Skin: Negative.   Neurological: Negative.   Endo/Heme/Allergies: Negative.   Psychiatric/Behavioral: Negative.     PHYSICAL EXAMINATION:    BP 140/90 (BP Location: Right Arm, Patient Position: Sitting, Cuff Size: Large)   Temp (!) 97.3 F (36.3 C) (Skin)   Wt 296 lb 6.4 oz (134.4 kg)   LMP 04/16/2019   BMI 45.07 kg/m     General appearance: alert, cooperative and appears stated age Abdomen: soft, non-tender; non distended, no masses,  no organomegaly  Pelvic: External genitalia:  no lesions              Urethra:  normal appearing urethra with no masses, tenderness or lesions              Bartholins and Skenes: normal                 Cervix: no cervical motion tenderness              Bimanual Exam:  Uterus:  anteverted, normal sized, mobile, mildly tender              Adnexa: no masses, tender on the left                Chaperone was present for exam.  ASSESSMENT Postmenopausal bleeding, atrophic endometrium on biopsy, thin endometrium on ultrasound. States her cycles only stopped last year, suspect she could still be perimenopausal, particularly given ovarian cyst Left ovarian cyst Pelvic pain/cramping    PLAN CBC, FSH, CA125 Anaprox DS for pain Provera 10 mg BID to stop bleeding, then one tab po qd x 10 days Call with worsening pain, or continued bleeding F/U ultrasound in 2 months   An After Visit Summary was printed and given to the  patient.

## 2019-04-21 LAB — FOLLICLE STIMULATING HORMONE: FSH: 44.9 m[IU]/mL

## 2019-04-21 LAB — CBC WITH DIFFERENTIAL/PLATELET
Basophils Absolute: 0.1 10*3/uL (ref 0.0–0.2)
Basos: 1 %
EOS (ABSOLUTE): 0.3 10*3/uL (ref 0.0–0.4)
Eos: 4 %
Hematocrit: 39.8 % (ref 34.0–46.6)
Hemoglobin: 12.9 g/dL (ref 11.1–15.9)
Immature Grans (Abs): 0 10*3/uL (ref 0.0–0.1)
Immature Granulocytes: 0 %
Lymphocytes Absolute: 4.7 10*3/uL — ABNORMAL HIGH (ref 0.7–3.1)
Lymphs: 50 %
MCH: 33.2 pg — ABNORMAL HIGH (ref 26.6–33.0)
MCHC: 32.4 g/dL (ref 31.5–35.7)
MCV: 103 fL — ABNORMAL HIGH (ref 79–97)
Monocytes Absolute: 0.6 10*3/uL (ref 0.1–0.9)
Monocytes: 7 %
Neutrophils Absolute: 3.6 10*3/uL (ref 1.4–7.0)
Neutrophils: 38 %
Platelets: 320 10*3/uL (ref 150–450)
RBC: 3.88 x10E6/uL (ref 3.77–5.28)
RDW: 12.2 % (ref 11.7–15.4)
WBC: 9.4 10*3/uL (ref 3.4–10.8)

## 2019-04-21 LAB — CA 125: Cancer Antigen (CA) 125: 6.4 U/mL (ref 0.0–38.1)

## 2019-05-04 ENCOUNTER — Other Ambulatory Visit: Payer: BLUE CROSS/BLUE SHIELD | Admitting: Obstetrics and Gynecology

## 2019-05-04 ENCOUNTER — Other Ambulatory Visit: Payer: BLUE CROSS/BLUE SHIELD

## 2019-06-20 NOTE — Progress Notes (Signed)
GYNECOLOGY  VISIT   HPI: 48 y.o.   Married Black or [place] American Not Hispanic or Latino  female   [phone] with No LMP recorded. Patient is perimenopausal.   here for consult following PUS.  She was seen in 5/20 with PMP bleeding, FSH 44.9, thin endometrial stripe, atrophic endometrium on biopsy. U/S incidentally with a 2 cm left ovarian cyst.  The patient was treated with provera to stop the bleeding. I suspect she is still peri-menopausal. No bleeding since May.  GYNECOLOGIC HISTORY: No LMP recorded. Patient is perimenopausal. Contraception: None Menopausal hormone therapy: None        OB History    Gravida  2   Para  2   Term      Preterm  2   AB      Living  2     SAB      TAB      Ectopic      Multiple      Live Births  2              Patient Active Problem List   Diagnosis Date Noted  . Primary osteoarthritis of both hips 07/29/2018  . Primary osteoarthritis of both knees 07/29/2018  . Primary osteoarthritis of both feet 07/29/2018  . Tonsillitis 05/02/2018  . Hypokalemia 09/03/2017  . Acute prerenal failure ([place]) 09/03/2017  . OSA (obstructive sleep apnea) 12/10/2016  . GERD (gastroesophageal reflux disease) 12/04/2016  . Hypersomnia 10/29/2016  . Back pain 08/21/2016  . Chronic pain of multiple joints 08/13/2016  . MGUS (monoclonal gammopathy of unknown significance) 07/02/2016  . Vitamin D deficiency 07/02/2016  . Diarrhea 07/02/2016  . Joint pain 04/03/2016  . HTN (hypertension), benign 11/11/2015  . Morbid obesity ([place]) 11/11/2015  . Adrenal mass ([place]) 08/30/2015  . Ovarian cyst 08/30/2015    Past Medical History:  Diagnosis Date  . Arthritis   . Gallstones   . GERD (gastroesophageal reflux disease)   . Hypertension   . MGUS (monoclonal gammopathy of unknown significance)   . Obesity   . Osteoarthritis   . PONV (postoperative nausea and vomiting)   . Rheumatoid arthritis ([place])   . Sleep apnea    SENT CPAP BACK, NOT WORN IN 7-8  MONTHS    Past Surgical History:  Procedure Laterality Date  . BREAST DUCTAL SYSTEM EXCISION Bilateral 10/12/2017   Procedure: EXCISION OF BILATERAL CENTRAL DUCT;  Surgeon: Erroll Luna, MD;  Location: [facility];  Service: General;  Laterality: Bilateral;  . BREAST EXCISIONAL BIOPSY    . CHOLECYSTECTOMY N/A 12/21/2015   Procedure: LAPAROSCOPIC CHOLECYSTECTOMY;  Surgeon: Georganna Skeans, MD;  Location: [facility];  Service: General;  Laterality: N/A;  . UMBILICAL HERNIA REPAIR N/A 12/27/2015   Procedure: INCARCERATED UMBILICAL HERNIA REPAIR;  Surgeon: Coralie Keens, MD;  Location: [facility];  Service: General;  Laterality: N/A;    Current Outpatient Medications  Medication Sig Dispense Refill  . amLODipine (NORVASC) 10 MG tablet Take 1 tablet (10 mg total) by mouth daily. Needs office visit for more refills 90 tablet 0  . gabapentin (NEURONTIN) 300 MG capsule Take 1 capsule (300 mg total) by mouth at bedtime. 90 capsule 3  . hydrochlorothiazide (HYDRODIURIL) 25 MG tablet Take 1 tablet (25 mg total) by mouth daily. Needs office visit for more refills 90 tablet 0  . ibuprofen (ADVIL) 200 MG tablet Take 200 mg by mouth every 6 (six) hours as needed.    Marland Kitchen lisinopril (PRINIVIL,ZESTRIL) 40 MG tablet Take  1 tablet (40 mg total) by mouth daily. Needs OV for more refills 90 tablet 0  . naproxen sodium (ANAPROX DS) 550 MG tablet Take 1 tablet (550 mg total) by mouth 2 (two) times daily with a meal. Take prn pain 30 tablet 1  . phentermine 37.5 MG capsule TAKE 1 CAPSULE BY MOUTH EVERY MORNING 30 capsule 0  . tizanidine (ZANAFLEX) 6 MG capsule Take 1 capsule (6 mg total) by mouth 3 (three) times daily as needed for muscle spasms. 90 capsule 5   No current facility-administered medications for this visit.      ALLERGIES: Patient has no known allergies.  Family History  Problem Relation Age of Onset  . Arthritis Mother   . High blood pressure Mother   . Sleep apnea Mother   . High blood pressure Father    . Breast cancer Paternal Aunt   . Arthritis Sister   . Healthy Daughter   . Healthy Son   . Adrenal disorder Neg Hx   . Colon cancer Neg Hx   . Esophageal cancer Neg Hx   . Pancreatic cancer Neg Hx   . Stomach cancer Neg Hx   . Liver disease Neg Hx     Social History   Socioeconomic History  . Marital status: Married    Spouse name: Briella Hobday  . Number of children: 2  . Years of education: Not on file  . Highest education level: Not on file  Occupational History  . Occupation: Stay at [employer]: unemploed  Social Needs  . Financial resource strain: Not on file  . Food insecurity    Worry: Not on file    Inability: Not on file  . Transportation needs    Medical: Not on file    Non-medical: Not on file  Tobacco Use  . Smoking status: Current Every Day Smoker    Packs/day: 0.25    Years: 24.00    Pack years: 6.00    Types: Cigarettes  . Smokeless tobacco: Never Used  Substance and Sexual Activity  . Alcohol use: Yes    Alcohol/week: 2.0 standard drinks    Types: 2 Shots of liquor per week    Comment: per week  . Drug use: Yes    Types: Marijuana  . Sexual activity: Yes    Partners: Male    Birth control/protection: None  Lifestyle  . Physical activity    Days per week: Not on file    Minutes per session: Not on file  . Stress: Not on file  Relationships  . Social Herbalist on phone: Not on file    Gets together: Not on file    Attends religious service: Not on file    Active member of club or organization: Not on file    Attends meetings of clubs or organizations: Not on file    Relationship status: Not on file  . Intimate partner violence    Fear of current or ex partner: Not on file    Emotionally abused: Not on file    Physically abused: Not on file    Forced sexual activity: Not on file  Other Topics Concern  . Not on file  Social History Narrative   Lives with husband, Berneta Sages.    Review of Systems  Constitutional:  Negative.   HENT: Negative.   Eyes: Negative.   Respiratory: Negative.   Cardiovascular: Negative.   Gastrointestinal: Positive for abdominal pain.  Genitourinary:  Negative.   Musculoskeletal: Negative.   Skin: Negative.   Neurological: Negative.   Endo/Heme/Allergies: Negative.   Psychiatric/Behavioral: Negative.     PHYSICAL EXAMINATION:    BP 120/80 (BP Location: Right Arm, Patient Position: Sitting, Cuff Size: Large)   Pulse 68   Temp 97.7 F (36.5 C) (Skin)   Wt 300 lb (136.1 kg)   BMI 45.61 kg/m     General appearance: alert, cooperative and appears stated age   ASSESSMENT Post vs perimenopausal bleeding, atrophic endometrium on biopsy, FSH 44.9. No further bleeding Left ovarian cyst, decreasing in size, now ~1cm    PLAN Will treat with cyclic provera until she goes 6 months without a cycle No further ultrasounds needed F/U for annual exam in 2/21   An After Visit Summary was printed and given to the patient.

## 2019-06-22 ENCOUNTER — Other Ambulatory Visit: Payer: Self-pay

## 2019-06-22 ENCOUNTER — Ambulatory Visit (INDEPENDENT_AMBULATORY_CARE_PROVIDER_SITE_OTHER): Payer: BC Managed Care – PPO | Admitting: Obstetrics and Gynecology

## 2019-06-22 ENCOUNTER — Ambulatory Visit (INDEPENDENT_AMBULATORY_CARE_PROVIDER_SITE_OTHER): Payer: BC Managed Care – PPO

## 2019-06-22 ENCOUNTER — Encounter: Payer: Self-pay | Admitting: Obstetrics and Gynecology

## 2019-06-22 ENCOUNTER — Other Ambulatory Visit: Payer: BC Managed Care – PPO

## 2019-06-22 VITALS — BP 120/80 | HR 68 | Temp 97.7°F | Wt 300.0 lb

## 2019-06-22 DIAGNOSIS — N83202 Unspecified ovarian cyst, left side: Secondary | ICD-10-CM

## 2019-06-22 DIAGNOSIS — N939 Abnormal uterine and vaginal bleeding, unspecified: Secondary | ICD-10-CM

## 2019-06-22 MED ORDER — MEDROXYPROGESTERONE ACETATE 5 MG PO TABS: ORAL_TABLET | ORAL | 1 refills | Status: DC

## 2019-06-22 NOTE — Progress Notes (Signed)
Office Visit Note  Patient: Alexandra Powers             Date of Birth: 1971/09/04           MRN: [id number]             PCP: Binnie Rail, MD Referring: Binnie Rail, MD Visit Date: 07/06/2019 Occupation: @GUAROCC @  Subjective:  Pain in multiple joints   History of Present Illness: Alexandra Powers is a 48 y.o. female with history of seropositive rheumatoid arthritis and osteoarthritis.  She is not on any immunosuppressive agents at this time.  In the past she took Plaquenil for 3 months but discontinued due to not noticing any benefit.  She was then started on methotrexate which she took for 1 month and discontinued due to noticing improvement.  She did not restart on methotrexate after her last visit.  She continues to have pain in multiple joints including bilateral shoulders, bilateral wrists, bilateral hands, left hip, both knees, and both feet.  She states that she notices intermittent swelling in bilateral hands and bilateral knee joints.  She states that she is tried taking Tylenol and Advil over-the-counter for pain relief which has been ineffective.  She would like to discuss other treatment options.   Activities of Daily Living:  Patient reports morning stiffness for several hours.   Patient Reports nocturnal pain.  Difficulty dressing/grooming: Reports Difficulty climbing stairs: Reports Difficulty getting out of chair: Reports Difficulty using hands for taps, buttons, cutlery, and/or writing: Reports  Review of Systems  Constitutional: Positive for fatigue.  HENT: Negative for mouth sores, mouth dryness and nose dryness.   Eyes: Negative for pain, itching, visual disturbance and dryness.  Respiratory: Negative for cough, hemoptysis, shortness of breath, wheezing and difficulty breathing.   Cardiovascular: Negative for chest pain, palpitations, hypertension and swelling in legs/feet.  Gastrointestinal: Negative for blood in stool, constipation and diarrhea.  Endocrine:  Negative for increased urination.  Genitourinary: Negative for painful urination.  Musculoskeletal: Positive for arthralgias, joint pain, joint swelling and morning stiffness. Negative for myalgias, muscle weakness, muscle tenderness and myalgias.  Skin: Negative for color change, pallor, rash, hair loss, nodules/bumps, skin tightness, ulcers and sensitivity to sunlight.  Allergic/Immunologic: Negative for susceptible to infections.  Neurological: Negative for dizziness, numbness, headaches, memory loss and weakness.  Hematological: Negative for swollen glands.  Psychiatric/Behavioral: Positive for sleep disturbance. Negative for depressed mood and confusion. The patient is not nervous/anxious.     PMFS History:  Patient Active Problem List   Diagnosis Date Noted  . Primary osteoarthritis of both hips 07/29/2018  . Primary osteoarthritis of both knees 07/29/2018  . Primary osteoarthritis of both feet 07/29/2018  . Tonsillitis 05/02/2018  . Hypokalemia 09/03/2017  . Acute prerenal failure ([place]) 09/03/2017  . OSA (obstructive sleep apnea) 12/10/2016  . GERD (gastroesophageal reflux disease) 12/04/2016  . Hypersomnia 10/29/2016  . Back pain 08/21/2016  . Chronic pain of multiple joints 08/13/2016  . MGUS (monoclonal gammopathy of unknown significance) 07/02/2016  . Vitamin D deficiency 07/02/2016  . Diarrhea 07/02/2016  . Joint pain 04/03/2016  . HTN (hypertension), benign 11/11/2015  . Morbid obesity ([place]) 11/11/2015  . Adrenal mass ([place]) 08/30/2015  . Ovarian cyst 08/30/2015    Past Medical History:  Diagnosis Date  . Arthritis   . Gallstones   . GERD (gastroesophageal reflux disease)   . Hypertension   . MGUS (monoclonal gammopathy of unknown significance)   . Obesity   . Osteoarthritis   .  PONV (postoperative nausea and vomiting)   . Rheumatoid arthritis ([place])   . Sleep apnea    SENT CPAP BACK, NOT WORN IN 7-8 MONTHS    Family History  Problem Relation Age of Onset   . Arthritis Mother   . High blood pressure Mother   . Sleep apnea Mother   . High blood pressure Father   . Breast cancer Paternal Aunt   . Arthritis Sister   . Healthy Daughter   . Healthy Son   . Adrenal disorder Neg Hx   . Colon cancer Neg Hx   . Esophageal cancer Neg Hx   . Pancreatic cancer Neg Hx   . Stomach cancer Neg Hx   . Liver disease Neg Hx    Past Surgical History:  Procedure Laterality Date  . BREAST DUCTAL SYSTEM EXCISION Bilateral 10/12/2017   Procedure: EXCISION OF BILATERAL CENTRAL DUCT;  Surgeon: Erroll Luna, MD;  Location: [place];  Service: General;  Laterality: Bilateral;  . BREAST EXCISIONAL BIOPSY    . CHOLECYSTECTOMY N/A 12/21/2015   Procedure: LAPAROSCOPIC CHOLECYSTECTOMY;  Surgeon: Georganna Skeans, MD;  Location: [place];  Service: General;  Laterality: N/A;  . UMBILICAL HERNIA REPAIR N/A 12/27/2015   Procedure: INCARCERATED UMBILICAL HERNIA REPAIR;  Surgeon: Coralie Keens, MD;  Location: [facility];  Service: General;  Laterality: N/A;   Social History   Social History Narrative   Lives with husband, Berneta Sages.   Immunization History  Administered Date(s) Administered  . Influenza,inj,Quad PF,6+ Mos 08/29/2015, 08/21/2016, 08/31/2017, 08/09/2018  . Influenza,inj,quad, With Preservative 08/30/2017  . Tdap 08/29/2015     Objective: Vital Signs: BP (!) 140/96 (BP Location: Left Wrist, Patient Position: Sitting, Cuff Size: Normal)   Pulse 78   Resp 14   Ht 5\' 8"  (1.727 m)   Wt (!) 305 lb (138.3 kg)   BMI 46.38 kg/m    Physical Exam Vitals signs and nursing note reviewed.  Constitutional:      Appearance: She is well-developed.  HENT:     Head: Normocephalic and atraumatic.  Eyes:     Conjunctiva/sclera: Conjunctivae normal.  Neck:     Musculoskeletal: Normal range of motion.  Cardiovascular:     Rate and Rhythm: Normal rate and regular rhythm.     Heart sounds: Normal heart sounds.  Pulmonary:     Effort: Pulmonary effort is normal.      Breath sounds: Normal breath sounds.  Abdominal:     General: Bowel sounds are normal.     Palpations: Abdomen is soft.  Lymphadenopathy:     Cervical: No cervical adenopathy.  Skin:    General: Skin is warm and dry.     Capillary Refill: Capillary refill takes less than 2 seconds.  Neurological:     Mental Status: She is alert and oriented to person, place, and time.  Psychiatric:        Behavior: Behavior normal.      Musculoskeletal Exam: C-spine good ROM.  Midline spinal tenderness in thoracic and lumbar spine.  Right shoulder abduction to 90 degrees.  Left shoulder has full ROM.  Elbow joints have good ROM with no tenderness or joint swelling.  She has limited ROM of both wrist joints with discomfort. Tenderness of both wrist joints. She has tenderness of all MCPs and PIP joints.  Left hip has limited ROM with discomfort.  Knee joints have full ROM with discomfort.  Warmth of both knee joints noted.  No tenderness or swelling of ankle joints.  CDAI Exam: CDAI Score: 25.3  Patient Global: 7 mm; Provider Global: 6 mm Swollen: 0 ; Tender: 24  Joint Exam      Right  Left  Glenohumeral   Tender   Tender  Wrist   Tender   Tender  MCP 1   Tender   Tender  MCP 2   Tender   Tender  MCP 3   Tender   Tender  MCP 4   Tender   Tender  MCP 5   Tender   Tender  IP   Tender   Tender  PIP 2   Tender   Tender  PIP 3   Tender   Tender  PIP 4   Tender   Tender  PIP 5   Tender   Tender     Investigation: No additional findings.  Imaging: US Pelvis Transvanginal Non-ob (tv Only)  Result Date: 06/22/2019 SEE PROGRESS NOTE   Recent Labs: Lab Results  Component Value Date   WBC 9.4 04/20/2019   HGB 12.9 04/20/2019   PLT 320 04/20/2019   NA 143 02/02/2019   K 3.9 02/02/2019   CL 108 02/02/2019   CO2 28 02/02/2019   GLUCOSE 73 02/02/2019   BUN 22 02/02/2019   CREATININE 1.08 02/02/2019   BILITOT 0.3 02/02/2019   ALKPHOS 56 08/26/2018   AST 16 02/02/2019   ALT 8 02/02/2019    PROT 6.7 02/02/2019   ALBUMIN 3.2 (L) 08/26/2018   CALCIUM 9.1 02/02/2019   GFRAA 70 02/02/2019   QFTBGOLDPLUS POSITIVE (A) 07/29/2018    Speciality Comments: PLQ Eye Exam: 08/12/18 WNL @ [facility] Follow up in 1 year.   Procedures:  No procedures performed Allergies: Patient has no known allergies.        Assessment / Plan:     Visit Diagnoses: Rheumatoid arthritis involving multiple sites with positive rheumatoid factor (HCC) - Positive RF, positive anti-CCP, positive elevated sedimentation rate, positive synovitis on ultrasound -She has no obvious synovitis on exam.  She has tenderness of multiple joints as described above.  She is not on any immunosuppressive agents at this time.  She previously took Plaquenil for 3 months but discontinued due to not noticing any improvement.  She discontinued methotrexate due to not noticing improvement after 1 month.  She did not restart methotrexate after her last visit.  She would like to start on injectable methotrexate.  Indications, contraindications, potential side effects methotrexate were discussed. She was advised to notify us if she cannot tolerate MTX.  She will follow up in 2 months.   Drug Counseling TB Gold: Underwent treatment-10/04/18  Hepatitis panel: negative 07/25/18 Chest-xray:  08/05/18 no acute cardiopulmonary disease  Alcohol use: Discussed the importance of avoiding alcohol use  Patient was counseled on the purpose, proper use, and adverse effects of methotrexate including nausea, infection, and signs and symptoms of pneumonitis.  Reviewed instructions with patient to take methotrexate weekly along with folic acid daily.  Discussed the importance of frequent monitoring of kidney and liver function and blood counts, and provided patient with standing lab instructions.  Counseled patient to avoid NSAIDs and alcohol while on methotrexate.  Provided patient with educational materials on methotrexate and answered all questions.   Advised patient to get annual influenza vaccine and to get a pneumococcal vaccine if patient has not already had one.  Patient voiced understanding.  Patient consented to methotrexate use.  Will upload into chart.    High risk medication use - Inadequate response to  Plaquenil in the past.  She took methotrexate for 1 month but discontinued due to not noticing any improvement.  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   Primary osteoarthritis of both hips - She has limited range of motion with discomfort of the left hip.  Right hip has good range of motion.  She was previously getting cortisone injections in the left hip but cannot afford another injection at this time.  Primary osteoarthritis of both knees - She has chronic pain in bilateral knee joints.  She has painful but full range of motion of both knees on exam today.  She has mild warmth but no effusion noted.  Primary osteoarthritis of both feet -She has been having increased pain in bilateral feet.  She is tenderness of MTPs but no synovitis.  Other medical conditions are listed as follows:  HTN (hypertension), benign   Gastroesophageal reflux disease, esophagitis presence not specified   OSA (obstructive sleep apnea)   MGUS (monoclonal gammopathy of unknown significance)  Vitamin D deficiency   Orders: Orders Placed This Encounter  Procedures  . CBC with Differential/Platelet  . COMPLETE METABOLIC PANEL WITH GFR   No orders of the defined types were placed in this encounter.   Face-to-face time spent with patient was 30 minutes. Greater than 50% of time was spent in counseling and coordination of care.  Follow-Up Instructions: Return in about 2 months (around 09/05/2019) for Rheumatoid arthritis, Osteoarthritis.   Hazel Sams, PA-C  I examined and evaluated the patient with Hazel Sams PA.  Patient continues to have  increased joint pain and discomfort.  She has not started methotrexate 2 times in the past when prescribed.  She states she is ready to start methotrexate now as she continues to have lot of arthralgias.  She had joint tenderness in multiple joints but no synovitis.  Today after different treatment options were discussed and side effects were reviewed she wants to restart on methotrexate.  She will start on methotrexate after the lab results are available.  The plan of care was discussed as noted above.  Bo Merino, MD  Note - This record has been created using Editor, commissioning.  Chart creation errors have been sought, but may not always  have been located. Such creation errors do not reflect on  the standard of medical care.

## 2019-06-22 NOTE — Progress Notes (Signed)
    ADDENDUM;THE LEFT OVARY W/ FOLLICULAR TYPE CYST - 13 X 9 X 6 MM, AVASCULAR, ECHOFREE, SMOOTH MARGINS, DECREASED IN SIZE SINCE 5/21-20 @ 27 X 16 X 20 MM

## 2019-07-06 ENCOUNTER — Ambulatory Visit (INDEPENDENT_AMBULATORY_CARE_PROVIDER_SITE_OTHER): Payer: BLUE CROSS/BLUE SHIELD | Admitting: Rheumatology

## 2019-07-06 ENCOUNTER — Other Ambulatory Visit: Payer: Self-pay

## 2019-07-06 ENCOUNTER — Encounter: Payer: Self-pay | Admitting: Rheumatology

## 2019-07-06 ENCOUNTER — Telehealth: Payer: Self-pay | Admitting: Pharmacist

## 2019-07-06 VITALS — BP 140/96 | HR 78 | Resp 14 | Ht 68.0 in | Wt 305.0 lb

## 2019-07-06 DIAGNOSIS — Z79899 Other long term (current) drug therapy: Secondary | ICD-10-CM | POA: Diagnosis not present

## 2019-07-06 DIAGNOSIS — R7612 Nonspecific reaction to cell mediated immunity measurement of gamma interferon antigen response without active tuberculosis: Secondary | ICD-10-CM | POA: Diagnosis not present

## 2019-07-06 DIAGNOSIS — D472 Monoclonal gammopathy: Secondary | ICD-10-CM

## 2019-07-06 DIAGNOSIS — I10 Essential (primary) hypertension: Secondary | ICD-10-CM

## 2019-07-06 DIAGNOSIS — M160 Bilateral primary osteoarthritis of hip: Secondary | ICD-10-CM | POA: Diagnosis not present

## 2019-07-06 DIAGNOSIS — G4733 Obstructive sleep apnea (adult) (pediatric): Secondary | ICD-10-CM

## 2019-07-06 DIAGNOSIS — E559 Vitamin D deficiency, unspecified: Secondary | ICD-10-CM

## 2019-07-06 DIAGNOSIS — M0579 Rheumatoid arthritis with rheumatoid factor of multiple sites without organ or systems involvement: Secondary | ICD-10-CM | POA: Diagnosis not present

## 2019-07-06 DIAGNOSIS — K219 Gastro-esophageal reflux disease without esophagitis: Secondary | ICD-10-CM

## 2019-07-06 DIAGNOSIS — M19071 Primary osteoarthritis, right ankle and foot: Secondary | ICD-10-CM

## 2019-07-06 DIAGNOSIS — M170 Bilateral primary osteoarthritis of knee: Secondary | ICD-10-CM

## 2019-07-06 DIAGNOSIS — M19072 Primary osteoarthritis, left ankle and foot: Secondary | ICD-10-CM

## 2019-07-06 NOTE — Telephone Encounter (Signed)
Please start BIV for injectable methotrexate.  Patient unable to tolerate Plaquenil or oral methotrexate.    Prescription pending lab results and insurance approval.  Will schedule new start visit for injection technique training and methotrexate counseling.

## 2019-07-06 NOTE — Telephone Encounter (Signed)
Tried to submit Prior Authorization via Covermymeds, but received a message that Medication my be excluded from plan and we must call to submit PA.  Called [facility], Rep Lelon Frohlich initiated  [employer], but she was unable to type information for the Prior Authorization questions. She could only choose yes or no to answer. Plan requires patient to have tried/failed or have a contraindication for Generic MTX vial. Rep will fax form to provider's office to have [place] document the patient's dexterity issues with manually drawing up vial of MTX.   Will follow up and update once we receive a response.  Phone# [phone]  1:31 PM Beatriz Chancellor, CPhT

## 2019-07-06 NOTE — Telephone Encounter (Signed)
Received prior authorization request form from [place] for Otrexup. Completed request and faxed to [phone].  Will send document to scan center. Will update when we receive a response.

## 2019-07-07 LAB — CBC WITH DIFFERENTIAL/PLATELET
Absolute Monocytes: 605 cells/uL (ref 200–950)
Basophils Absolute: 67 cells/uL (ref 0–200)
Basophils Relative: 0.7 %
Eosinophils Absolute: 365 cells/uL (ref 15–500)
Eosinophils Relative: 3.8 %
HCT: 37.4 % (ref 35.0–45.0)
Hemoglobin: 12.6 g/dL (ref 11.7–15.5)
Lymphs Abs: 4522 cells/uL — ABNORMAL HIGH (ref 850–3900)
MCH: 33 pg (ref 27.0–33.0)
MCHC: 33.7 g/dL (ref 32.0–36.0)
MCV: 97.9 fL (ref 80.0–100.0)
MPV: 10.1 fL (ref 7.5–12.5)
Monocytes Relative: 6.3 %
Neutro Abs: 4042 cells/uL (ref 1500–7800)
Neutrophils Relative %: 42.1 %
Platelets: 335 10*3/uL (ref 140–400)
RBC: 3.82 10*6/uL (ref 3.80–5.10)
RDW: 11.8 % (ref 11.0–15.0)
Total Lymphocyte: 47.1 %
WBC: 9.6 10*3/uL (ref 3.8–10.8)

## 2019-07-07 LAB — COMPLETE METABOLIC PANEL WITH GFR
AG Ratio: 1.2 (calc) (ref 1.0–2.5)
ALT: 7 U/L (ref 6–29)
AST: 16 U/L (ref 10–35)
Albumin: 4 g/dL (ref 3.6–5.1)
Alkaline phosphatase (APISO): 66 U/L (ref 31–125)
BUN/Creatinine Ratio: 14 (calc) (ref 6–22)
BUN: 16 mg/dL (ref 7–25)
CO2: 26 mmol/L (ref 20–32)
Calcium: 9.6 mg/dL (ref 8.6–10.2)
Chloride: 108 mmol/L (ref 98–110)
Creat: 1.11 mg/dL — ABNORMAL HIGH (ref 0.50–1.10)
GFR, Est African American: 68 mL/min/{1.73_m2} (ref >=60)
GFR, Est Non African American: 58 mL/min/{1.73_m2} — ABNORMAL LOW (ref >=60)
Globulin: 3.3 g/dL (calc) (ref 1.9–3.7)
Glucose, Bld: 96 mg/dL (ref 65–99)
Potassium: 3.7 mmol/L (ref 3.5–5.3)
Sodium: 143 mmol/L (ref 135–146)
Total Bilirubin: 0.4 mg/dL (ref 0.2–1.2)
Total Protein: 7.3 g/dL (ref 6.1–8.1)

## 2019-07-07 NOTE — Progress Notes (Signed)
Elevated creatinine.  Please advise patient to discontinue use of all NSAIDs.  Please forward a copy of these labs to her PCP.

## 2019-07-10 ENCOUNTER — Telehealth: Payer: Self-pay

## 2019-07-10 NOTE — Telephone Encounter (Signed)
Received notification from [facility] or [place] regarding a prior authorization for [place]. Authorization has been APPROVED from 06/22/2019 to 11/29/2098.   Will send document to scan center.  Authorization # [id number] Phone # [phone]

## 2019-07-10 NOTE — Telephone Encounter (Signed)
Opened in error

## 2019-07-12 ENCOUNTER — Telehealth: Payer: Self-pay | Admitting: Rheumatology

## 2019-07-12 NOTE — Telephone Encounter (Signed)
Patient called about update on methotrexate approval.  Notified patient of approval for Otrexup pens.    Reviewed lab results from 07/06/2019. Her most recent labs showed elevated serum creatinine and she is not on any over the counter pain relievers except for Tylenol.    Informed patient that will need to verify methotrexate dose with Dr. Estanislado Pandy due to changes in labs.  Patient verbalized understanding.    She asked to be notified via my chart that status of her methotrexate dose.  She needs injection technique training.  All questions encouraged and answered.  Instructed patient to call with any further questions or concerns.  Mariella Saa, PharmD, [place], [place] Clinical Specialty Pharmacist [phone]  07/12/2019 9:42 AM

## 2019-07-12 NOTE — Telephone Encounter (Signed)
Patient called stating she spoke with Amber who was going to verify her prescription dosage with Dr. Estanislado Pandy and call her back.  Patient is requesting a return call.

## 2019-07-12 NOTE — Telephone Encounter (Signed)
Spoke with patient advised arava will be a better option for her due to her creatinine being elevated. Patient states she is not planning pregnancy in the future and is not using contraception on a regular basis as she has not had a period in over a year. Patient has been scheduled for a visit with the clinical pharmacist to discuss the medication.

## 2019-07-12 NOTE — Telephone Encounter (Signed)
Alexandra Powers will be a better option for her due to elevated creatinine.  If she is not planning pregnancy in the future and is using contraception on a regular basis and Arava will be a better choice.  You may bring her in for counseling regarding the medication.

## 2019-07-13 ENCOUNTER — Ambulatory Visit (INDEPENDENT_AMBULATORY_CARE_PROVIDER_SITE_OTHER): Payer: BLUE CROSS/BLUE SHIELD | Admitting: Pharmacist

## 2019-07-13 ENCOUNTER — Other Ambulatory Visit: Payer: Self-pay

## 2019-07-13 DIAGNOSIS — M0579 Rheumatoid arthritis with rheumatoid factor of multiple sites without organ or systems involvement: Secondary | ICD-10-CM | POA: Diagnosis not present

## 2019-07-13 DIAGNOSIS — Z79899 Other long term (current) drug therapy: Secondary | ICD-10-CM

## 2019-07-13 NOTE — Progress Notes (Signed)
Subjective:    [name], female    DOB: [birth year], 48 y.o.   MRN: [id number]  HPI The patient is here for follow up.  Left breast infection: She has had breast abscess and cellulitis in the past.  Currently she has an infection in her left breast.  Her last infection was February of this past year.  The area is red, tender and warm.  She denies any fevers.  He does not have any discharge.  Back pain:  She has had nerve ablation in the past by Dr. Maryjean Ka- last done 12/2018.  She can not have another episode because she owes them money.  This has worked well for her and it does relieve her pain.  She is in a lot of pain since she is not able to have this done and she would like pain medication.    Hypertension: She is taking her medication daily. She is compliant with a low sodium diet.  She denies chest pain, palpitations, shortness of breath and regular headaches. She does not monitor her blood pressure at home.   Obesity: She needs to lose weight so that she can have her knee replacement.  She is not able to exercise much because of her joint pain.  Prior to the [place] pandemic she was going to the pool and that was helping, but she is not able to go there now.  She is trying to eat a healthy diet and not eating much.  Joint pain, RA and OA:  She follows with Dr Estanislado Pandy.  She will be starting [place] soon.  She needs to wait until her infection is completely gone.  OSA:  She does not tolerate her cpap and has not been using it.     Medications and allergies reviewed with patient and updated if appropriate.  Patient Active Problem List   Diagnosis Date Noted  . Primary osteoarthritis of both hips 07/29/2018  . Primary osteoarthritis of both knees 07/29/2018  . Primary osteoarthritis of both feet 07/29/2018  . Tonsillitis 05/02/2018  . Acute prerenal failure ([place]) 09/03/2017  . OSA (obstructive sleep apnea) 12/10/2016  . GERD (gastroesophageal reflux disease)  12/04/2016  . Back pain 08/21/2016  . Chronic pain of multiple joints 08/13/2016  . MGUS (monoclonal gammopathy of unknown significance) 07/02/2016  . Vitamin D deficiency 07/02/2016  . Diarrhea 07/02/2016  . Joint pain 04/03/2016  . HTN (hypertension), benign 11/11/2015  . Morbid obesity ([place]) 11/11/2015  . Adrenal mass ([place]) 08/30/2015  . Ovarian cyst 08/30/2015    Current Outpatient Medications on File Prior to Visit  Medication Sig Dispense Refill  . amLODipine (NORVASC) 10 MG tablet Take 1 tablet (10 mg total) by mouth daily. Needs office visit for more refills 90 tablet 0  . gabapentin (NEURONTIN) 300 MG capsule Take 1 capsule (300 mg total) by mouth at bedtime. 90 capsule 3  . hydrochlorothiazide (HYDRODIURIL) 25 MG tablet Take 1 tablet (25 mg total) by mouth daily. Needs office visit for more refills 90 tablet 0  . ibuprofen (ADVIL) 200 MG tablet Take 200 mg by mouth every 6 (six) hours as needed.    Marland Kitchen lisinopril (PRINIVIL,ZESTRIL) 40 MG tablet Take 1 tablet (40 mg total) by mouth daily. Needs OV for more refills 90 tablet 0  . medroxyPROGESTERone (PROVERA) 5 MG tablet Take one tablet po qd x 5 days every other month until you go 6 months without bleeding. Start on August 1st. 15 tablet 1  .  naproxen sodium (ANAPROX DS) 550 MG tablet Take 1 tablet (550 mg total) by mouth 2 (two) times daily with a meal. Take prn pain 30 tablet 1  . tizanidine (ZANAFLEX) 6 MG capsule Take 1 capsule (6 mg total) by mouth 3 (three) times daily as needed for muscle spasms. 90 capsule 5   No current facility-administered medications on file prior to visit.     Past Medical History:  Diagnosis Date  . Arthritis   . Gallstones   . GERD (gastroesophageal reflux disease)   . Hypertension   . MGUS (monoclonal gammopathy of unknown significance)   . Obesity   . Osteoarthritis   . PONV (postoperative nausea and vomiting)   . Rheumatoid arthritis ([place])   . Sleep apnea    SENT CPAP BACK, NOT WORN IN  7-8 MONTHS    Past Surgical History:  Procedure Laterality Date  . BREAST DUCTAL SYSTEM EXCISION Bilateral 10/12/2017   Procedure: EXCISION OF BILATERAL CENTRAL DUCT;  Surgeon: Erroll Luna, MD;  Location: [facility];  Service: General;  Laterality: Bilateral;  . BREAST EXCISIONAL BIOPSY    . CHOLECYSTECTOMY N/A 12/21/2015   Procedure: LAPAROSCOPIC CHOLECYSTECTOMY;  Surgeon: Georganna Skeans, MD;  Location: [facility];  Service: General;  Laterality: N/A;  . UMBILICAL HERNIA REPAIR N/A 12/27/2015   Procedure: INCARCERATED UMBILICAL HERNIA REPAIR;  Surgeon: Coralie Keens, MD;  Location: [facility];  Service: General;  Laterality: N/A;    Social History   Socioeconomic History  . Marital status: Married    Spouse name: Carolan Avedisian  . Number of children: 2  . Years of education: Not on file  . Highest education level: Not on file  Occupational History  . Occupation: Stay at [employer]: unemploed  Social Needs  . Financial resource strain: Not on file  . Food insecurity    Worry: Not on file    Inability: Not on file  . Transportation needs    Medical: Not on file    Non-medical: Not on file  Tobacco Use  . Smoking status: Current Every Day Smoker    Packs/day: 0.25    Years: 24.00    Pack years: 6.00    Types: Cigarettes  . Smokeless tobacco: Never Used  Substance and Sexual Activity  . Alcohol use: Yes    Alcohol/week: 2.0 standard drinks    Types: 2 Shots of liquor per week    Comment: per week  . Drug use: Yes    Types: Marijuana  . Sexual activity: Yes    Partners: Male    Birth control/protection: None  Lifestyle  . Physical activity    Days per week: Not on file    Minutes per session: Not on file  . Stress: Not on file  Relationships  . Social Herbalist on phone: Not on file    Gets together: Not on file    Attends religious service: Not on file    Active member of club or organization: Not on file    Attends meetings of clubs or organizations:  Not on file    Relationship status: Not on file  Other Topics Concern  . Not on file  Social History Narrative   Lives with husband, Berneta Sages.    Family History  Problem Relation Age of Onset  . Arthritis Mother   . High blood pressure Mother   . Sleep apnea Mother   . High blood pressure Father   . Breast cancer  Paternal Aunt   . Arthritis Sister   . Healthy Daughter   . Healthy Son   . Adrenal disorder Neg Hx   . Colon cancer Neg Hx   . Esophageal cancer Neg Hx   . Pancreatic cancer Neg Hx   . Stomach cancer Neg Hx   . Liver disease Neg Hx     Review of Systems  Constitutional: Negative for chills and fever.  Respiratory: Negative for cough, shortness of breath and wheezing.   Cardiovascular: Positive for leg swelling. Negative for chest pain and palpitations.  Gastrointestinal: Negative for abdominal pain, blood in stool, constipation, diarrhea and nausea.       GERD- once every two weeks  Neurological: Positive for headaches (occasional). Negative for dizziness and light-headedness.       Objective:   Vitals:   07/14/19 0919  BP: 138/80  Pulse: 71  Resp: 18  Temp: 99.3 F (37.4 C)  SpO2: 98%   BP Readings from Last 3 Encounters:  07/14/19 138/80  07/06/19 (!) 140/96  06/22/19 120/80   Wt Readings from Last 3 Encounters:  07/14/19 (!) 302 lb (137 kg)  07/06/19 (!) 305 lb (138.3 kg)  06/22/19 300 lb (136.1 kg)   Body mass index is 45.92 kg/m.   Physical Exam    Constitutional: Appears well-developed and well-nourished. No distress.  HENT:  Head: Normocephalic and atraumatic.  Neck: Neck supple. No tracheal deviation present. No thyromegaly present.  No cervical lymphadenopathy Cardiovascular: Normal rate, regular rhythm and normal heart sounds.   No murmur heard. No carotid bruit .  No edema Pulmonary/Chest: Effort normal and breath sounds normal. No respiratory distress. No has no wheezes. No rales.  Skin: Skin is warm and dry. Not diaphoretic.   Left breast with indurated areola and nipple region, surrounding erythema that is warm and tender.  No open wound, ulcer or discharge. Psychiatric: Normal mood and affect. Behavior is normal.      Assessment & Plan:    See Problem List for Assessment and Plan of chronic medical problems.

## 2019-07-13 NOTE — Patient Instructions (Signed)
  Vaccines You are taking a medication(s) that can suppress your immune system.  The following immunizations are recommended: . Flu annually . Pneumonia (Pneumovax 23 and Prevnar 13 spaced at least 1 year apart)  Please check with your PCP to make sure you are up to date.

## 2019-07-13 NOTE — Progress Notes (Signed)
Pharmacy Note  Subjective: Patient presents today to the [facility] to see Dr. Estanislado Pandy.  Patient seen by the pharmacist for counseling on leflunomide Jolee Ewing) for rheumatoid arthritis.  She was previously treated with Plaquenil and oral methotrexate and stopped taking after a few weeks as she did notice much improvement.  She was going to switch to injectable methotrexate but her serum creatinine was elevated.  Objective: CBC    Component Value Date/Time   WBC 9.6 07/06/2019 1010   RBC 3.82 07/06/2019 1010   HGB 12.6 07/06/2019 1010   HGB 12.9 04/20/2019 1016   HGB 12.8 08/05/2017 1057   HCT 37.4 07/06/2019 1010   HCT 39.8 04/20/2019 1016   HCT 39.2 08/05/2017 1057   PLT 335 07/06/2019 1010   PLT 320 04/20/2019 1016   MCV 97.9 07/06/2019 1010   MCV 103 (H) 04/20/2019 1016   MCV 99.7 08/05/2017 1057   MCH 33.0 07/06/2019 1010   MCHC 33.7 07/06/2019 1010   RDW 11.8 07/06/2019 1010   RDW 12.2 04/20/2019 1016   RDW 15.0 (H) 08/05/2017 1057   LYMPHSABS 4,522 (H) 07/06/2019 1010   LYMPHSABS 4.7 (H) 04/20/2019 1016   LYMPHSABS 3.8 (H) 08/05/2017 1057   MONOABS 0.5 08/26/2018 1309   MONOABS 0.7 08/05/2017 1057   EOSABS 365 07/06/2019 1010   EOSABS 0.3 04/20/2019 1016   BASOSABS 67 07/06/2019 1010   BASOSABS 0.1 04/20/2019 1016   BASOSABS 0.0 08/05/2017 1057    CMP     Component Value Date/Time   NA 143 07/06/2019 1010   NA 138 03/17/2018 1138   NA 142 08/05/2017 1057   K 3.7 07/06/2019 1010   K 2.7 (LL) 08/05/2017 1057   CL 108 07/06/2019 1010   CO2 26 07/06/2019 1010   CO2 33 (H) 08/05/2017 1057   GLUCOSE 96 07/06/2019 1010   GLUCOSE 103 08/05/2017 1057   BUN 16 07/06/2019 1010   BUN 12 03/17/2018 1138   BUN 31.2 (H) 08/05/2017 1057   CREATININE 1.11 (H) 07/06/2019 1010   CREATININE 1.8 (H) 08/05/2017 1057   CALCIUM 9.6 07/06/2019 1010   CALCIUM 10.4 08/05/2017 1057   PROT 7.3 07/06/2019 1010   PROT 7.3 03/17/2018 1138   PROT 8.3 08/05/2017 1057    ALBUMIN 3.2 (L) 08/26/2018 1309   ALBUMIN 3.9 03/17/2018 1138   ALBUMIN 3.2 (L) 08/05/2017 1057   AST 16 07/06/2019 1010   AST 18 08/05/2017 1057   ALT 7 07/06/2019 1010   ALT 10 08/05/2017 1057   ALKPHOS 56 08/26/2018 1309   ALKPHOS 62 08/05/2017 1057   BILITOT 0.4 07/06/2019 1010   BILITOT 0.4 03/17/2018 1138   BILITOT 0.46 08/05/2017 1057   GFRNONAA 58 (L) 07/06/2019 1010   GFRAA 68 07/06/2019 1010    Baseline Immunosuppressant Therapy Labs Quantiferon TB Gold Latest Ref Rng & Units 07/29/2018  Quantiferon TB Gold Plus NEGATIVE POSITIVE(A)    Hepatitis Latest Ref Rng & Units 07/25/2018  Hep B Surface Ag NON-REACTI NON-REACTIVE  Hep B IgM NON-REACTI NON-REACTIVE  Hep C Ab NON-REACTI NON-REACTIVE  Hep C Ab NON-REACTI NON-REACTIVE    Lab Results  Component Value Date   HIV NON-REACTIVE 07/25/2018   HIV NONREACTIVE 04/03/2016    Immunoglobulin Electrophoresis Latest Ref Rng & Units 08/26/2018  IgA  47 - 310 mg/dL -  IgG 700 - 1,600 mg/dL 1,419  IgM 26 - 217 mg/dL 68    Serum Protein Electrophoresis Latest Ref Rng & Units 07/06/2019  Total Protein 6.1 -  8.1 g/dL 7.3    Lab Results  Component Value Date   G6PDH 13.5 07/25/2018    No results found for: TPMT   Pregnancy status:  N/A patient states she is post-menopausal  Assessment/Plan:  Patient was counseled on the purpose, proper use, and adverse effects of leflunomide including risk of infection, nausea/diarrhea/weight loss, increase in blood pressure, rash, hair loss, tingling in the hands and feet, and signs and symptoms of interstitial lung disease.   Also counseled on [place] warning of liver injury and importance of avoiding alcohol while on therapy. Discussed that there is the possibility of an increased risk of malignancy but it is not well understood if this increased risk is due to the medication or the disease state.  Counseled patient to avoid live vaccines. Recommend annual influenza, Pneumovax 23,  Prevnar 13, and Shingrix as indicated.   Discussed the importance of frequent monitoring of liver function and blood count.  Standing orders placed.  Discussed importance of birth control while on leflunomide due to risk of congenital abnormalities, and patient confirms she is post-menopausal.  Provided patient with educational materials on leflunomide and answered all questions.  Patient consented to [place] use, and consent will be uploaded into the media tab.    She has a history of hypertension and on blood pressure medication.  Will monitor for changes in blood pressure.  Patient dose will be Arava 10 mg daily.    Patient has recurrent infection/cyst in her breast.  She previously had breast ductal excision but still having issues.  She has been referred to the breast center.  She states they have put her on antibiotics to help clear in the past.  Advised that she should not start [place] until after her appointment with the breast center and we receive clearance from their office.  Patient verbalized understanding.  Instructed patient to call our office to update after appointment at the breast center.  Patient verbalized understanding.  All questions encouraged and answered.  Instructed to call with any further questions or concerns.  Mariella Saa, PharmD, [place], [place] Clinical Specialty Pharmacist [phone]  07/13/2019 8:58 AM

## 2019-07-13 NOTE — Patient Instructions (Addendum)
Medications reviewed and updated.  Changes include :   Start doxycycline for the skin infection.  Your prescription(s) have been submitted to your pharmacy. Please take as directed and contact our office if you believe you are having problem(s) with the medication(s).  A referral was ordered for pain management  Please followup in 6 months

## 2019-07-14 ENCOUNTER — Encounter: Payer: Self-pay | Admitting: Internal Medicine

## 2019-07-14 ENCOUNTER — Ambulatory Visit (INDEPENDENT_AMBULATORY_CARE_PROVIDER_SITE_OTHER): Payer: BLUE CROSS/BLUE SHIELD | Admitting: Internal Medicine

## 2019-07-14 ENCOUNTER — Other Ambulatory Visit: Payer: Self-pay

## 2019-07-14 VITALS — BP 138/80 | HR 71 | Temp 99.3°F | Resp 18 | Ht 68.0 in | Wt 302.0 lb

## 2019-07-14 DIAGNOSIS — N61 Mastitis without abscess: Secondary | ICD-10-CM | POA: Insufficient documentation

## 2019-07-14 DIAGNOSIS — G4733 Obstructive sleep apnea (adult) (pediatric): Secondary | ICD-10-CM

## 2019-07-14 DIAGNOSIS — M545 Low back pain, unspecified: Secondary | ICD-10-CM

## 2019-07-14 DIAGNOSIS — I10 Essential (primary) hypertension: Secondary | ICD-10-CM

## 2019-07-14 DIAGNOSIS — M25559 Pain in unspecified hip: Secondary | ICD-10-CM

## 2019-07-14 DIAGNOSIS — M2550 Pain in unspecified joint: Secondary | ICD-10-CM

## 2019-07-14 DIAGNOSIS — G8929 Other chronic pain: Secondary | ICD-10-CM

## 2019-07-14 DIAGNOSIS — M069 Rheumatoid arthritis, unspecified: Secondary | ICD-10-CM

## 2019-07-14 DIAGNOSIS — M05852 Other rheumatoid arthritis with rheumatoid factor of left hip: Secondary | ICD-10-CM | POA: Insufficient documentation

## 2019-07-14 MED ORDER — DOXYCYCLINE HYCLATE 100 MG PO TABS: 100.0000 mg | ORAL_TABLET | Freq: Two times a day (BID) | ORAL | 0 refills | Status: DC

## 2019-07-14 MED ORDER — HYDROCHLOROTHIAZIDE 25 MG PO TABS: 25.0000 mg | ORAL_TABLET | Freq: Every day | ORAL | 1 refills | Status: DC

## 2019-07-14 MED ORDER — AMLODIPINE BESYLATE 10 MG PO TABS: 10.0000 mg | ORAL_TABLET | Freq: Every day | ORAL | 1 refills | Status: DC

## 2019-07-14 MED ORDER — LISINOPRIL 40 MG PO TABS: 40.0000 mg | ORAL_TABLET | Freq: Every day | ORAL | 1 refills | Status: DC

## 2019-07-14 NOTE — Assessment & Plan Note (Signed)
Left breast cellulitis Start doxycycline twice daily x10 days If no improvement she will call

## 2019-07-14 NOTE — Assessment & Plan Note (Signed)
Trying to lose weight so that she can have her knee replaced She is limited with what exercise she can do because of joint pain Discussed that she can lose weight by making diet changes-healthy diet, decrease portions

## 2019-07-14 NOTE — Assessment & Plan Note (Signed)
Following with Dr. Estanislado Pandy We will be starting [place] once her breast cellulitis has incompletely treated

## 2019-07-14 NOTE — Assessment & Plan Note (Signed)
Does not tolerate cpap and not using Advised her to follow up with pulomonary

## 2019-07-14 NOTE — Assessment & Plan Note (Signed)
She is a combination of osteoarthritis and rheumatoid arthritis She will be starting [place] some point soon which will help with some of her joint pain She is working on [employer] loss-she needs to lose weight before she can have her knee replaced

## 2019-07-14 NOTE — Assessment & Plan Note (Signed)
She is not able to have any nerve ablation at this time/injections by Dr. Maryjean Ka because she owes their practice money-knees were very effective for controlling her pain She would like pain medications I will refer her to pain management

## 2019-07-14 NOTE — Assessment & Plan Note (Signed)
Reasonably controlled Continue current medications cmp

## 2019-07-20 ENCOUNTER — Telehealth: Payer: Self-pay | Admitting: Rheumatology

## 2019-07-20 NOTE — Telephone Encounter (Signed)
Patient calling to let you know she will be taking Doxycycline Hyclate 100mg . Patient wants to know if she will be able to do the injections after she is done with the antibiotic, or will she have to take the pills? Please call to advise.

## 2019-07-20 NOTE — Telephone Encounter (Signed)
Returned patient call.  She was seen by her PCP and was prescribed doxycycline for her breast abcess/infection.  She has no follow up scheduled with her PCP.  PCP did not think a referral to the breast center was necessary at this time.  Instructed patient to call when she is finished with her antibiotic next week so we can assess if her infection has resolved.  Patient verbalized understanding.  Patient also had a questions about taking Arava and her history of latent TB.  She is concerned that the medication could reactivate it.  Informed patient that since she was treated for 6 months there is no contraindication to starting [place].  Informed that we will monitor chest x-ray yearly and she will be due for a chest x-ray in September.  Patient verbalized understanding.  All questions encouraged and answered.  Instructed her to call with any other questions or concerns.   Mariella Saa, PharmD, [place], [place] Clinical Specialty Pharmacist [phone]  07/20/2019 10:48 AM

## 2019-07-24 ENCOUNTER — Ambulatory Visit: Payer: BLUE CROSS/BLUE SHIELD | Admitting: Internal Medicine

## 2019-07-28 ENCOUNTER — Telehealth: Payer: Self-pay | Admitting: Rheumatology

## 2019-07-28 NOTE — Telephone Encounter (Signed)
Patient called stating she just finished her antibiotics and requesting a return call to discuss starting [place].

## 2019-07-28 NOTE — Telephone Encounter (Signed)
Patient states she has finished the antibiotics on 07/27/19. Patient advised to contact PCP for evaluation to make sure infection has cleared prior to [place] starting her on Arava. Patient verbalized understanding.

## 2019-07-30 NOTE — Progress Notes (Signed)
Subjective:    [name], female    DOB: [birth year], 48 y.o.   MRN: [id number]  HPI The patient is here for follow up.   Breast infection/abscess:  She completed the doxycycline. It did help, but is not gone completely.  She still feels a small bump near the nipple on the left breast and her right breast --  she still has the cyst.  She denies pain, drainage or fever / chills.  These cysts have been infected several times.    She was advised to follow up to make sure her infection was treated before starting on Arava.    Medications and allergies reviewed with patient and updated if appropriate.  Patient Active Problem List   Diagnosis Date Noted  . Cellulitis of left breast 07/14/2019  . Rheumatoid arthritis ([place]) 07/14/2019  . Primary osteoarthritis of both hips 07/29/2018  . Primary osteoarthritis of both knees 07/29/2018  . Primary osteoarthritis of both feet 07/29/2018  . Tonsillitis 05/02/2018  . Acute prerenal failure ([place]) 09/03/2017  . OSA (obstructive sleep apnea) 12/10/2016  . GERD (gastroesophageal reflux disease) 12/04/2016  . Chronic back pain 08/21/2016  . Chronic pain of multiple joints 08/13/2016  . MGUS (monoclonal gammopathy of unknown significance) 07/02/2016  . Vitamin D deficiency 07/02/2016  . HTN (hypertension), benign 11/11/2015  . Morbid obesity ([place]) 11/11/2015  . Adrenal mass ([place]) 08/30/2015  . Ovarian cyst 08/30/2015    Current Outpatient Medications on File Prior to Visit  Medication Sig Dispense Refill  . amLODipine (NORVASC) 10 MG tablet Take 1 tablet (10 mg total) by mouth daily. Needs office visit for more refills 90 tablet 1  . gabapentin (NEURONTIN) 300 MG capsule Take 1 capsule (300 mg total) by mouth at bedtime. 90 capsule 3  . hydrochlorothiazide (HYDRODIURIL) 25 MG tablet Take 1 tablet (25 mg total) by mouth daily. Needs office visit for more refills 90 tablet 1  . ibuprofen (ADVIL) 200 MG tablet Take 200 mg by mouth  every 6 (six) hours as needed.    Marland Kitchen lisinopril (ZESTRIL) 40 MG tablet Take 1 tablet (40 mg total) by mouth daily. Needs OV for more refills 90 tablet 1  . medroxyPROGESTERone (PROVERA) 5 MG tablet Take one tablet po qd x 5 days every other month until you go 6 months without bleeding. Start on August 1st. 15 tablet 1  . naproxen sodium (ANAPROX DS) 550 MG tablet Take 1 tablet (550 mg total) by mouth 2 (two) times daily with a meal. Take prn pain 30 tablet 1  . tizanidine (ZANAFLEX) 6 MG capsule Take 1 capsule (6 mg total) by mouth 3 (three) times daily as needed for muscle spasms. 90 capsule 5   No current facility-administered medications on file prior to visit.     Past Medical History:  Diagnosis Date  . Arthritis   . Gallstones   . GERD (gastroesophageal reflux disease)   . Hypertension   . MGUS (monoclonal gammopathy of unknown significance)   . Obesity   . Osteoarthritis   . PONV (postoperative nausea and vomiting)   . Rheumatoid arthritis ([place])   . Sleep apnea    SENT CPAP BACK, NOT WORN IN 7-8 MONTHS    Past Surgical History:  Procedure Laterality Date  . BREAST DUCTAL SYSTEM EXCISION Bilateral 10/12/2017   Procedure: EXCISION OF BILATERAL CENTRAL DUCT;  Surgeon: Erroll Luna, MD;  Location: [facility];  Service: General;  Laterality: Bilateral;  . BREAST EXCISIONAL BIOPSY    .  CHOLECYSTECTOMY N/A 12/21/2015   Procedure: LAPAROSCOPIC CHOLECYSTECTOMY;  Surgeon: Georganna Skeans, MD;  Location: [facility];  Service: General;  Laterality: N/A;  . UMBILICAL HERNIA REPAIR N/A 12/27/2015   Procedure: INCARCERATED UMBILICAL HERNIA REPAIR;  Surgeon: Coralie Keens, MD;  Location: [facility];  Service: General;  Laterality: N/A;    Social History   Socioeconomic History  . Marital status: Married    Spouse name: Makayela Harner  . Number of children: 2  . Years of education: Not on file  . Highest education level: Not on file  Occupational History  . Occupation: Stay at [employer]:  unemploed  Social Needs  . Financial resource strain: Not on file  . Food insecurity    Worry: Not on file    Inability: Not on file  . Transportation needs    Medical: Not on file    Non-medical: Not on file  Tobacco Use  . Smoking status: Current Every Day Smoker    Packs/day: 0.25    Years: 24.00    Pack years: 6.00    Types: Cigarettes  . Smokeless tobacco: Never Used  Substance and Sexual Activity  . Alcohol use: Yes    Alcohol/week: 2.0 standard drinks    Types: 2 Shots of liquor per week    Comment: per week  . Drug use: Yes    Types: Marijuana  . Sexual activity: Yes    Partners: Male    Birth control/protection: None  Lifestyle  . Physical activity    Days per week: Not on file    Minutes per session: Not on file  . Stress: Not on file  Relationships  . Social Herbalist on phone: Not on file    Gets together: Not on file    Attends religious service: Not on file    Active member of club or organization: Not on file    Attends meetings of clubs or organizations: Not on file    Relationship status: Not on file  Other Topics Concern  . Not on file  Social History Narrative   Lives with husband, Berneta Sages.    Family History  Problem Relation Age of Onset  . Arthritis Mother   . High blood pressure Mother   . Sleep apnea Mother   . High blood pressure Father   . Breast cancer Paternal Aunt   . Arthritis Sister   . Healthy Daughter   . Healthy Son   . Adrenal disorder Neg Hx   . Colon cancer Neg Hx   . Esophageal cancer Neg Hx   . Pancreatic cancer Neg Hx   . Stomach cancer Neg Hx   . Liver disease Neg Hx     Review of Systems  Constitutional: Negative for chills and fever.  Skin: Negative for color change and wound.       Can feel cysts/bump in areola on both breasts       Objective:   Vitals:   08/01/19 0804  BP: 122/82  Pulse: 72  Resp: 16  Temp: 98.3 F (36.8 C)  SpO2: 99%   BP Readings from Last 3 Encounters:  08/01/19  122/82  07/14/19 138/80  07/06/19 (!) 140/96   Wt Readings from Last 3 Encounters:  08/01/19 (!) 301 lb 12.8 oz (136.9 kg)  07/14/19 (!) 302 lb (137 kg)  07/06/19 (!) 305 lb (138.3 kg)   Body mass index is 45.89 kg/m.   Physical Exam  Constitutional: Appears well-developed and well-nourished. No distress.  Breasts:  Right breast - cyst palpable in areola at 1 o'clock near nipple that is not tender, no nipple discharge, no erythema                  Left breast - cyst palpable in areola - no open wound or discharge, no nipple discharge, no breat erythema Skin: Skin is warm and dry. Not diaphoretic.      Assessment & Plan:    See Problem List for Assessment and Plan of chronic medical problems.

## 2019-08-01 ENCOUNTER — Ambulatory Visit (INDEPENDENT_AMBULATORY_CARE_PROVIDER_SITE_OTHER): Payer: BC Managed Care – PPO | Admitting: Internal Medicine

## 2019-08-01 ENCOUNTER — Other Ambulatory Visit: Payer: Self-pay

## 2019-08-01 ENCOUNTER — Encounter: Payer: Self-pay | Admitting: Internal Medicine

## 2019-08-01 VITALS — BP 122/82 | HR 72 | Temp 98.3°F | Resp 16 | Ht 68.0 in | Wt 301.8 lb

## 2019-08-01 DIAGNOSIS — N6001 Solitary cyst of right breast: Secondary | ICD-10-CM | POA: Diagnosis not present

## 2019-08-01 DIAGNOSIS — N6002 Solitary cyst of left breast: Secondary | ICD-10-CM | POA: Diagnosis not present

## 2019-08-01 DIAGNOSIS — Z23 Encounter for immunization: Secondary | ICD-10-CM | POA: Diagnosis not present

## 2019-08-01 NOTE — Assessment & Plan Note (Signed)
No evidence of infection - she still has the cysts in both breasts No need for antibiotics at this time These cysts have had recurrent infections Will refer to surgery to see about possible removal - referred today

## 2019-08-01 NOTE — Patient Instructions (Signed)
A referral was ordered for surgery.      Flu immunization administered today.

## 2019-08-08 ENCOUNTER — Encounter
Payer: BC Managed Care – PPO | Attending: Physical Medicine and Rehabilitation | Admitting: Physical Medicine and Rehabilitation

## 2019-08-08 ENCOUNTER — Encounter: Payer: Self-pay | Admitting: Physical Medicine and Rehabilitation

## 2019-08-08 ENCOUNTER — Other Ambulatory Visit: Payer: Self-pay

## 2019-08-08 ENCOUNTER — Ambulatory Visit (HOSPITAL_COMMUNITY)
Admission: RE | Admit: 2019-08-08 | Discharge: 2019-08-08 | Disposition: A | Discharge status: Home / Self Care | Payer: BC Managed Care – PPO | Source: Ambulatory Visit | Attending: Physical Medicine and Rehabilitation | Admitting: Physical Medicine and Rehabilitation

## 2019-08-08 VITALS — BP 108/75 | HR 74 | Temp 97.0°F | Ht 68.0 in | Wt 301.2 lb

## 2019-08-08 DIAGNOSIS — M545 Low back pain, unspecified: Secondary | ICD-10-CM

## 2019-08-08 DIAGNOSIS — G894 Chronic pain syndrome: Secondary | ICD-10-CM | POA: Insufficient documentation

## 2019-08-08 DIAGNOSIS — G8929 Other chronic pain: Secondary | ICD-10-CM | POA: Diagnosis not present

## 2019-08-08 DIAGNOSIS — Z978 Presence of other specified devices: Secondary | ICD-10-CM | POA: Diagnosis not present

## 2019-08-08 DIAGNOSIS — M05852 Other rheumatoid arthritis with rheumatoid factor of left hip: Secondary | ICD-10-CM | POA: Insufficient documentation

## 2019-08-08 DIAGNOSIS — M4606 Spinal enthesopathy, lumbar region: Secondary | ICD-10-CM | POA: Diagnosis not present

## 2019-08-08 DIAGNOSIS — M7918 Myalgia, other site: Secondary | ICD-10-CM | POA: Diagnosis not present

## 2019-08-08 DIAGNOSIS — Z79891 Long term (current) use of opiate analgesic: Secondary | ICD-10-CM

## 2019-08-08 DIAGNOSIS — M160 Bilateral primary osteoarthritis of hip: Secondary | ICD-10-CM | POA: Diagnosis not present

## 2019-08-08 DIAGNOSIS — Z5181 Encounter for therapeutic drug level monitoring: Secondary | ICD-10-CM | POA: Insufficient documentation

## 2019-08-08 DIAGNOSIS — M2578 Osteophyte, vertebrae: Secondary | ICD-10-CM | POA: Diagnosis not present

## 2019-08-08 MED ORDER — CYCLOBENZAPRINE HCL 10 MG PO TABS: 10.0000 mg | ORAL_TABLET | Freq: Two times a day (BID) | ORAL | 5 refills | Status: DC | PRN

## 2019-08-08 NOTE — Patient Instructions (Signed)
Pt is a 48 yr old R handed female with  RA and DJD who needs B/L knee replacements (BMI is ~40; goal <35 for knee surgery); with chronic low back pain and L hip and lateral hip pain c/w trochanteric bursitis.   1. Night sweats- suggest either Black cohosh over the cuunter, or low dose Prozac from PCP.  2. Trochanteric bursitis- wants to do Home exercise program- suggest looking for IT band exercises- also , needs an appointment for trochanteric bursitis injections.  3. L hip DJD- xray needed to know how severe it is- will try to schedule a L hip steroid injection- will see if Dr Letta Pate does; if not, will send to [facility] for it to be done under IR guidance.  4. For RA, can't get good control currently because can't start [facility] as of yet and can't do NSAIDs- because of kidney function; can't do steroids long term- can cause losing bone and make pts gain weight. Focus on getting Arava start asap.   5. Myofascial pain- muscle tightness- has tried Zanaflex 6 mg as needed- doesn't make sleepy, but only takes rarely- will try Flexeril/Cyclobenzaprine-  Suggest taking 1 pill up to 2x/day as needed.  6. Suggest theracane- to be gotten online-can also use tennis ball-  hold pressure 3-4 minutes on each tender spot (trigger points)- don't massage, just hold pressure. No more than 20-30 minutes/day.  7. Back xray to be ordered as well-    8. Wait on nerve pain meds- will address next visit if necessary. No tramadol- wasn't helpful and Norco/Vicodin only lasted a few hours.  9. F/U- 2-3 weeks

## 2019-08-08 NOTE — Addendum Note (Signed)
Addended by: Jasmine December T on: 08/08/2019 10:45 AM   Modules accepted: Orders

## 2019-08-08 NOTE — Progress Notes (Signed)
Subjective:    [name], female    DOB: Apr 15, 1971, 48 y.o.   MRN: [id number]  HPI   CC; Back and hip pain   Waiting for PCP/referral to a physician for breast cysts in nipples (even had nipple ducts removed and still keeps coming back)- Has a large cyst in both of them right now.  So can't start Disease modifying agent- Arava- no improvement with Plaquinel and MTX- no significant improvement.    B/L but mainly L hip groin pain. Mainly aching/throbbing- if gets to walking, can't walk more than a few minutes due to pain.  Lateral hip pain mainly on L side- interferes with walking as well.  Back pain- B/L midline and B/L sides- a band across low back - can radiate into B/L buttocks with messed with the back/does any exercise. No numbness/tingling/burning- is a sharp shooting pain.  Is on gabapentin 300 mg QHS for night sweats- slows them down some.  Had back pain for ~ 3 years. - Better with- had radiofrequency ablation - lasted only 3 months. When wore off, couldn't get anything in between for the pain, so actually got worse for a time. Used to feel good when sat in hot tub or sauna for short time.  tramadol wasn't helpful, so didn't get a refill. Norco helped a few hours, but then would wear off. Wants something that lasts for a long time. When hurting really bad, lays down in bed and watches tv.     Needs knee surgery- was swimming- lost 100 lbs but COVID has stopped her going to gym, unfortunately.    Stopped NSAIDs since kidney function isn't good.   Lives in 2nd floor apartment- looking for a house- 1 story. Lives with husband and dog- pitbull/terrier    Pain Inventory Average Pain 10 Pain Right Now 6 My pain is sharp  In the last 24 hours, has pain interfered with the following? General activity 8 Relation with others 8 Enjoyment of life 8 What TIME of day is your pain at its worst? all Sleep (in general) Fair  Pain is worse with:  walking, bending, sitting, inactivity and standing Pain improves with: heat/ice and pacing activities Relief from Meds: na  Mobility how many minutes can you walk? 2 ability to climb steps?  no do you drive?  no  Function not employed: date last employed  na I need assistance with the following:  toileting  Neuro/Psych trouble walking spasms  Prior Studies Any changes since last visit?  no  Physicians involved in your care Primary care Dr. Billey Gosling   Family History  Problem Relation Age of Onset  . Arthritis Mother   . High blood pressure Mother   . Sleep apnea Mother   . High blood pressure Father   . Breast cancer Paternal Aunt   . Arthritis Sister   . Healthy Daughter   . Healthy Son   . Adrenal disorder Neg Hx   . Colon cancer Neg Hx   . Esophageal cancer Neg Hx   . Pancreatic cancer Neg Hx   . Stomach cancer Neg Hx   . Liver disease Neg Hx    Social History   Socioeconomic History  . Marital status: Married    Spouse name: Meara Mankiewicz  . Number of children: 2  . Years of education: Not on file  . Highest education level: Not on file  Occupational History  . Occupation: Stay at [employer]  Comment: unemploed  Social Needs  . Financial resource strain: Not on file  . Food insecurity    Worry: Not on file    Inability: Not on file  . Transportation needs    Medical: Not on file    Non-medical: Not on file  Tobacco Use  . Smoking status: Current Every Day Smoker    Packs/day: 0.25    Years: 24.00    Pack years: 6.00    Types: Cigarettes  . Smokeless tobacco: Never Used  Substance and Sexual Activity  . Alcohol use: Yes    Alcohol/week: 2.0 standard drinks    Types: 2 Shots of liquor per week    Comment: per week  . Drug use: Yes    Types: Marijuana  . Sexual activity: Yes    Partners: Male    Birth control/protection: None  Lifestyle  . Physical activity    Days per week: Not on file    Minutes per session: Not on file  . Stress: Not  on file  Relationships  . Social Herbalist on phone: Not on file    Gets together: Not on file    Attends religious service: Not on file    Active member of club or organization: Not on file    Attends meetings of clubs or organizations: Not on file    Relationship status: Not on file  Other Topics Concern  . Not on file  Social History Narrative   Lives with husband, Berneta Sages.   Past Surgical History:  Procedure Laterality Date  . BREAST DUCTAL SYSTEM EXCISION Bilateral 10/12/2017   Procedure: EXCISION OF BILATERAL CENTRAL DUCT;  Surgeon: Erroll Luna, MD;  Location: [facility];  Service: General;  Laterality: Bilateral;  . BREAST EXCISIONAL BIOPSY    . CHOLECYSTECTOMY N/A 12/21/2015   Procedure: LAPAROSCOPIC CHOLECYSTECTOMY;  Surgeon: Georganna Skeans, MD;  Location: [facility];  Service: General;  Laterality: N/A;  . UMBILICAL HERNIA REPAIR N/A 12/27/2015   Procedure: INCARCERATED UMBILICAL HERNIA REPAIR;  Surgeon: Coralie Keens, MD;  Location: [facility];  Service: General;  Laterality: N/A;   Past Medical History:  Diagnosis Date  . Arthritis   . Gallstones   . GERD (gastroesophageal reflux disease)   . Hypertension   . MGUS (monoclonal gammopathy of unknown significance)   . Obesity   . Osteoarthritis   . PONV (postoperative nausea and vomiting)   . Rheumatoid arthritis ([place])   . Sleep apnea    SENT CPAP BACK, NOT WORN IN 7-8 MONTHS   BP 108/75   Pulse 74   Temp (!) 97 F (36.1 C)   Ht 5\' 8"  (1.727 m)   Wt (!) 301 lb 3.2 oz (136.6 kg)   SpO2 98%   BMI 45.80 kg/m   Opioid Risk Score:   Fall Risk Score:  `1  Depression screen PHQ 2/9  Depression screen [facility] 2/9 08/08/2019 07/14/2019 03/17/2018 09/22/2017 09/16/2015 08/29/2015 05/28/2015  Decreased Interest 3 0 0 0 0 0 0  Down, Depressed, Hopeless 0 0 0 0 0 0 0  PHQ - 2 Score 3 0 0 0 0 0 0  Altered sleeping 1 - 0 - - - -  Tired, decreased energy 2 - 1 - - - -  Change in appetite 1 - 0 - - - -  Feeling bad or  failure about yourself  0 - 0 - - - -  Trouble concentrating 0 - 0 - - - -  Moving slowly or  fidgety/restless 0 - 0 - - - -  Suicidal thoughts 0 - 0 - - - -  PHQ-9 Score 7 - 1 - - - -  Difficult doing work/chores Not difficult at all - Not difficult at all - - - -     Review of Systems  Constitutional: Negative.   HENT: Negative.   Eyes: Negative.   Respiratory: Positive for apnea.   Cardiovascular: Negative.   Gastrointestinal: Negative.   Endocrine: Negative.   Genitourinary: Negative.   Musculoskeletal: Negative.   Allergic/Immunologic: Negative.   Neurological: Negative.   Hematological: Negative.   Psychiatric/Behavioral: Negative.   All other systems reviewed and are negative.      Objective:   Physical Exam  Awake, alert, appropriate, in gown, wearing mask, NAD MS: 5/5 in LEs B/L Has L hip reduced external rotation compared to R- has ~ 45 degrees on R and 15-25 degrees on L; equal internal rotation B/L- ~ 15-20 degrees TTP over L groin  TTP over L lateral hip c/w trochanteric bursitis TTP over B/L piriformis Tight paraspinals  In B/L lumbar and sacral areas L>R Hyperextension to R >L very painful Pain with lumbar flexion    Neuro: Intact to light touch B/L      Assessment & Plan:   Pt is a 48 yr old R handed female with  RA and DJD who needs B/L knee replacements (BMI is ~40; goal <35 for knee surgery); with chronic low back pain and L hip and lateral hip pain c/w trochanteric bursitis.   1. Night sweats- suggest either Black cohosh over the cuunter, or low dose Prozac from PCP.  2. Trochanteric bursitis- wants to do Home exercise program- suggest looking for IT band exercises- also , needs an appointment for trochanteric bursitis injections.  3. L hip DJD- xray needed to know how severe it is- will try to schedule a L hip steroid injection- will see if Dr Letta Pate does; if not, will send to [facility] for it to be done under IR guidance.  4. For RA,  can't get good control currently because can't start [place] as of yet and can't do NSAIDs- because of kidney function; can't do steroids long term- can cause losing bone and make pts gain weight. Focus on getting Arava start asap.   5. Myofascial pain- muscle tightness- has tried Zanaflex 6 mg as needed- doesn't make sleepy, but only takes rarely- will try Flexeril/Cyclobenzaprine-  Suggest taking 1 pill up to 2x/day as needed.  6. Suggest theracane- to be gotten online-can also use tennis ball-  hold pressure 3-4 minutes on each tender spot (trigger points)- don't massage, just hold pressure. No more than 20-30 minutes/day.  7. Back xray to be ordered as well-    8. Wait on nerve pain meds- will address next visit if necessary. No tramadol- wasn't helpful and Norco/Vicodin only lasted a few hours.  9. F/U- 2-3 weeks  I spent a total of 45 minutes on appointment- more than 30 minutes on educating pt on plan as detailed above and discussing why NOT prescribing opiates

## 2019-08-22 ENCOUNTER — Encounter: Payer: Self-pay | Admitting: Physical Medicine & Rehabilitation

## 2019-08-22 ENCOUNTER — Encounter (HOSPITAL_BASED_OUTPATIENT_CLINIC_OR_DEPARTMENT_OTHER): Payer: BC Managed Care – PPO | Admitting: Physical Medicine & Rehabilitation

## 2019-08-22 ENCOUNTER — Other Ambulatory Visit: Payer: Self-pay

## 2019-08-22 VITALS — BP 125/88 | HR 73 | Temp 97.0°F | Ht 68.0 in | Wt 306.0 lb

## 2019-08-22 DIAGNOSIS — Z79891 Long term (current) use of opiate analgesic: Secondary | ICD-10-CM | POA: Diagnosis not present

## 2019-08-22 DIAGNOSIS — M160 Bilateral primary osteoarthritis of hip: Secondary | ICD-10-CM | POA: Diagnosis not present

## 2019-08-22 DIAGNOSIS — G894 Chronic pain syndrome: Secondary | ICD-10-CM | POA: Diagnosis not present

## 2019-08-22 DIAGNOSIS — M05852 Other rheumatoid arthritis with rheumatoid factor of left hip: Secondary | ICD-10-CM | POA: Diagnosis not present

## 2019-08-22 DIAGNOSIS — G8929 Other chronic pain: Secondary | ICD-10-CM | POA: Diagnosis not present

## 2019-08-22 DIAGNOSIS — Z5181 Encounter for therapeutic drug level monitoring: Secondary | ICD-10-CM | POA: Diagnosis not present

## 2019-08-22 DIAGNOSIS — M545 Low back pain: Secondary | ICD-10-CM | POA: Diagnosis not present

## 2019-08-22 DIAGNOSIS — M7918 Myalgia, other site: Secondary | ICD-10-CM | POA: Diagnosis not present

## 2019-08-22 NOTE — Patient Instructions (Signed)
Xray guided left hip joint injection performed today.  Celestone (cortisone medication) and lidocaine (numbing medication) injected.  If hip joint is the source of your pain, this injection should last weeks to months.

## 2019-08-22 NOTE — Progress Notes (Signed)
  [facility]   Name: Alexandra Powers DOB:[birth year] MRN: [id number]  Date:08/22/2019  Physician: Alysia Penna, MD    Nurse/[name]: Bright [name]  Allergies: No Known Allergies  Consent Signed: Yes.    Is patient diabetic? No.  CBG today? NA  Pregnant: No. LMP: No LMP recorded. Patient is perimenopausal. (age 48-55)  Anticoagulants: no Anti-inflammatory: no Antibiotics: no  Procedure: Left Hip intraarticular injection Fluoro guided  Position: Supine   Start Time: 855am End Time: 900am Fluoro Time: 25s  RN/[name] Bright [name] Bright [name]    Time 838am 905am    BP 125/88 140/94    Pulse 73 78    Respirations 16 16    O2 Sat 98 97    S/S 6 6    Pain Level 6/10 0/10     D/C home with Husband, patient A & O X 3, D/C instructions reviewed, and sits independently.          hip intra-articular injection under fluoro guidance  Indication osteoarthritis unresponsive to conservative care including exercise and oral medications  Informed consent was obtained after describing risks and benefits of the procedure, this includes bleeding bruising and infection. The patient elected to proceed and has given written consent Patient placed supine on fluoro table AP hip identified the hip joint and inter trochanteric line.  Area was marked and prepped with Betadine. 25-gauge 1.5 inch needle was utilized to anesthetize the skin and subcutaneous tissue with 3 cc of 1% lidocaine. Then a 22-gauge ankle block 3.5 inch spinal  needle was advanced under pulsed fluoro imaging , targeting the junction of the femoral head and femoral neck. Bone contact was made.Isovue 200 injected x 1 ml demonstrating intra articular uptake. Then a solution containing 1.5 mL of 6 mg per mL/tone and 3.5 ML of 1% lidocaine were injected after negative drawback for blood. Patient tolerated procedure well. Post procedure instructions given.

## 2019-09-01 ENCOUNTER — Other Ambulatory Visit: Payer: Self-pay | Admitting: Hematology and Oncology

## 2019-09-01 ENCOUNTER — Inpatient Hospital Stay: Payer: BC Managed Care – PPO | Attending: Hematology and Oncology

## 2019-09-01 ENCOUNTER — Encounter: Payer: Self-pay | Admitting: Internal Medicine

## 2019-09-01 ENCOUNTER — Ambulatory Visit (INDEPENDENT_AMBULATORY_CARE_PROVIDER_SITE_OTHER): Payer: BC Managed Care – PPO | Admitting: Internal Medicine

## 2019-09-01 ENCOUNTER — Other Ambulatory Visit: Payer: Self-pay

## 2019-09-01 DIAGNOSIS — F419 Anxiety disorder, unspecified: Secondary | ICD-10-CM

## 2019-09-01 DIAGNOSIS — N182 Chronic kidney disease, stage 2 (mild): Secondary | ICD-10-CM | POA: Insufficient documentation

## 2019-09-01 DIAGNOSIS — M25519 Pain in unspecified shoulder: Secondary | ICD-10-CM | POA: Insufficient documentation

## 2019-09-01 DIAGNOSIS — D472 Monoclonal gammopathy: Secondary | ICD-10-CM | POA: Diagnosis not present

## 2019-09-01 DIAGNOSIS — Z793 Long term (current) use of hormonal contraceptives: Secondary | ICD-10-CM | POA: Insufficient documentation

## 2019-09-01 DIAGNOSIS — G894 Chronic pain syndrome: Secondary | ICD-10-CM

## 2019-09-01 DIAGNOSIS — Z79899 Other long term (current) drug therapy: Secondary | ICD-10-CM | POA: Insufficient documentation

## 2019-09-01 DIAGNOSIS — M549 Dorsalgia, unspecified: Secondary | ICD-10-CM | POA: Insufficient documentation

## 2019-09-01 DIAGNOSIS — G8929 Other chronic pain: Secondary | ICD-10-CM | POA: Insufficient documentation

## 2019-09-01 DIAGNOSIS — F329 Major depressive disorder, single episode, unspecified: Secondary | ICD-10-CM

## 2019-09-01 DIAGNOSIS — Z791 Long term (current) use of non-steroidal anti-inflammatories (NSAID): Secondary | ICD-10-CM | POA: Diagnosis not present

## 2019-09-01 DIAGNOSIS — F32A Depression, unspecified: Secondary | ICD-10-CM

## 2019-09-01 LAB — CBC WITH DIFFERENTIAL/PLATELET
Abs Immature Granulocytes: 0.02 10*3/uL (ref 0.00–0.07)
Basophils Absolute: 0.1 10*3/uL (ref 0.0–0.1)
Basophils Relative: 1 %
Eosinophils Absolute: 0.3 10*3/uL (ref 0.0–0.5)
Eosinophils Relative: 4 %
HCT: 37.1 % (ref 36.0–46.0)
Hemoglobin: 12.1 g/dL (ref 12.0–15.0)
Immature Granulocytes: 0 %
Lymphocytes Relative: 50 %
Lymphs Abs: 4.5 10*3/uL — ABNORMAL HIGH (ref 0.7–4.0)
MCH: 32.4 pg (ref 26.0–34.0)
MCHC: 32.6 g/dL (ref 30.0–36.0)
MCV: 99.5 fL (ref 80.0–100.0)
Monocytes Absolute: 0.4 10*3/uL (ref 0.1–1.0)
Monocytes Relative: 5 %
Neutro Abs: 3.5 10*3/uL (ref 1.7–7.7)
Neutrophils Relative %: 40 %
Platelets: 304 10*3/uL (ref 150–400)
RBC: 3.73 MIL/uL — ABNORMAL LOW (ref 3.87–5.11)
RDW: 13.1 % (ref 11.5–15.5)
WBC: 8.9 10*3/uL (ref 4.0–10.5)
nRBC: 0 % (ref 0.0–0.2)

## 2019-09-01 LAB — COMPREHENSIVE METABOLIC PANEL
ALT: 7 U/L (ref 0–44)
AST: 13 U/L — ABNORMAL LOW (ref 15–41)
Albumin: 3.4 g/dL — ABNORMAL LOW (ref 3.5–5.0)
Alkaline Phosphatase: 76 U/L (ref 38–126)
Anion gap: 8 (ref 5–15)
BUN: 12 mg/dL (ref 6–20)
CO2: 27 mmol/L (ref 22–32)
Calcium: 9.1 mg/dL (ref 8.9–10.3)
Chloride: 107 mmol/L (ref 98–111)
Creatinine, Ser: 1.12 mg/dL — ABNORMAL HIGH (ref 0.44–1.00)
GFR calc Af Amer: >60 mL/min (ref >60)
GFR calc non Af Amer: 58 mL/min — ABNORMAL LOW (ref >60)
Glucose, Bld: 107 mg/dL — ABNORMAL HIGH (ref 70–99)
Potassium: 3.5 mmol/L (ref 3.5–5.1)
Sodium: 142 mmol/L (ref 135–145)
Total Bilirubin: 0.3 mg/dL (ref 0.3–1.2)
Total Protein: 7.2 g/dL (ref 6.5–8.1)

## 2019-09-01 NOTE — Progress Notes (Signed)
Subjective:    [name], female    DOB: 1971-11-27, 48 y.o.   MRN: [id number]  HPI The patient is here for an acute visit.  She does have some underlying depression and anxiety at times, but nothing serious.  It has never been so bad that she feels like she needs medication.  Her dog is an emotional support for her.  Her dog is very comforting to her.  Her dog comforts her when she is anxious, depression or if she has increased physical pain.  She is moving to a place that will only allow for emotional support animals and she would like to get a letter stating that her animal is an emotional support animal so that she can take the animal to the new apartment.  Medications and allergies reviewed with patient and updated if appropriate.  Patient Active Problem List   Diagnosis Date Noted  . Chronic pain syndrome 08/08/2019  . Chronic myofascial pain 08/08/2019  . Bilateral breast cysts 08/01/2019  . Cellulitis of left breast 07/14/2019  . Other rheumatoid arthritis with rheumatoid factor of left hip ([place]) 07/14/2019  . Primary osteoarthritis of both hips 07/29/2018  . Primary osteoarthritis of both knees 07/29/2018  . Primary osteoarthritis of both feet 07/29/2018  . Tonsillitis 05/02/2018  . Acute prerenal failure ([place]) 09/03/2017  . OSA (obstructive sleep apnea) 12/10/2016  . GERD (gastroesophageal reflux disease) 12/04/2016  . Back pain at L4-L5 level 08/21/2016  . Chronic pain of multiple joints 08/13/2016  . MGUS (monoclonal gammopathy of unknown significance) 07/02/2016  . Vitamin D deficiency 07/02/2016  . HTN (hypertension), benign 11/11/2015  . Morbid obesity ([place]) 11/11/2015  . Adrenal mass ([place]) 08/30/2015  . Ovarian cyst 08/30/2015    Current Outpatient Medications on File Prior to Visit  Medication Sig Dispense Refill  . amLODipine (NORVASC) 10 MG tablet Take 1 tablet (10 mg total) by mouth daily. Needs office visit for more refills 90 tablet 1  .  cyclobenzaprine (FLEXERIL) 10 MG tablet Take 1 tablet (10 mg total) by mouth 2 (two) times daily as needed for muscle spasms. 60 tablet 5  . gabapentin (NEURONTIN) 300 MG capsule Take 1 capsule (300 mg total) by mouth at bedtime. 90 capsule 3  . hydrochlorothiazide (HYDRODIURIL) 25 MG tablet Take 1 tablet (25 mg total) by mouth daily. Needs office visit for more refills 90 tablet 1  . ibuprofen (ADVIL) 200 MG tablet Take 200 mg by mouth every 6 (six) hours as needed.    Marland Kitchen lisinopril (ZESTRIL) 40 MG tablet Take 1 tablet (40 mg total) by mouth daily. Needs OV for more refills 90 tablet 1  . medroxyPROGESTERone (PROVERA) 5 MG tablet Take one tablet po qd x 5 days every other month until you go 6 months without bleeding. Start on August 1st. 15 tablet 1  . naproxen sodium (ANAPROX DS) 550 MG tablet Take 1 tablet (550 mg total) by mouth 2 (two) times daily with a meal. Take prn pain 30 tablet 1  . tizanidine (ZANAFLEX) 6 MG capsule Take 1 capsule (6 mg total) by mouth 3 (three) times daily as needed for muscle spasms. 90 capsule 5   No current facility-administered medications on file prior to visit.     Past Medical History:  Diagnosis Date  . Arthritis   . Gallstones   . GERD (gastroesophageal reflux disease)   . Hypertension   . MGUS (monoclonal gammopathy of unknown significance)   . Obesity   .  Osteoarthritis   . PONV (postoperative nausea and vomiting)   . Rheumatoid arthritis ([place])   . Sleep apnea    SENT CPAP BACK, NOT WORN IN 7-8 MONTHS    Past Surgical History:  Procedure Laterality Date  . BREAST DUCTAL SYSTEM EXCISION Bilateral 10/12/2017   Procedure: EXCISION OF BILATERAL CENTRAL DUCT;  Surgeon: Erroll Luna, MD;  Location: [facility];  Service: General;  Laterality: Bilateral;  . BREAST EXCISIONAL BIOPSY    . CHOLECYSTECTOMY N/A 12/21/2015   Procedure: LAPAROSCOPIC CHOLECYSTECTOMY;  Surgeon: Georganna Skeans, MD;  Location: [facility];  Service: General;  Laterality: N/A;  .  UMBILICAL HERNIA REPAIR N/A 12/27/2015   Procedure: INCARCERATED UMBILICAL HERNIA REPAIR;  Surgeon: Coralie Keens, MD;  Location: [facility];  Service: General;  Laterality: N/A;    Social History   Socioeconomic History  . Marital status: Married    Spouse name: Lia Shubin  . Number of children: 2  . Years of education: Not on file  . Highest education level: Not on file  Occupational History  . Occupation: Stay at [employer]: unemploed  Social Needs  . Financial resource strain: Not on file  . Food insecurity    Worry: Not on file    Inability: Not on file  . Transportation needs    Medical: Not on file    Non-medical: Not on file  Tobacco Use  . Smoking status: Current Every Day Smoker    Packs/day: 0.25    Years: 24.00    Pack years: 6.00    Types: Cigarettes  . Smokeless tobacco: Never Used  Substance and Sexual Activity  . Alcohol use: Yes    Alcohol/week: 2.0 standard drinks    Types: 2 Shots of liquor per week    Comment: per week  . Drug use: Yes    Types: Marijuana  . Sexual activity: Yes    Partners: Male    Birth control/protection: None  Lifestyle  . Physical activity    Days per week: Not on file    Minutes per session: Not on file  . Stress: Not on file  Relationships  . Social Herbalist on phone: Not on file    Gets together: Not on file    Attends religious service: Not on file    Active member of club or organization: Not on file    Attends meetings of clubs or organizations: Not on file    Relationship status: Not on file  Other Topics Concern  . Not on file  Social History Narrative   Lives with husband, Berneta Sages.    Family History  Problem Relation Age of Onset  . Arthritis Mother   . High blood pressure Mother   . Sleep apnea Mother   . High blood pressure Father   . Breast cancer Paternal Aunt   . Arthritis Sister   . Healthy Daughter   . Healthy Son   . Adrenal disorder Neg Hx   . Colon cancer Neg Hx   .  Esophageal cancer Neg Hx   . Pancreatic cancer Neg Hx   . Stomach cancer Neg Hx   . Liver disease Neg Hx     Review of Systems  Constitutional: Negative for chills and fever.  Musculoskeletal: Positive for arthralgias and back pain.  Psychiatric/Behavioral: Positive for dysphoric mood (Mild, intermittent). The patient is nervous/anxious (Mild, intermittent).        Objective:   Vitals:   09/01/19 1520  BP: 102/72  Pulse: (!) 101  Temp: 98.8 F (37.1 C)  SpO2: 95%   BP Readings from Last 3 Encounters:  09/01/19 102/72  08/22/19 125/88  08/08/19 108/75   Wt Readings from Last 3 Encounters:  09/01/19 (!) 305 lb (138.3 kg)  08/22/19 (!) 306 lb (138.8 kg)  08/08/19 (!) 301 lb 3.2 oz (136.6 kg)   Body mass index is 46.38 kg/m.   Physical Exam Constitutional:      Appearance: Normal appearance.  HENT:     Head: Normocephalic and atraumatic.  Skin:    General: Skin is warm and dry.  Neurological:     General: No focal deficit present.     Mental Status: She is alert.  Psychiatric:        Mood and Affect: Mood normal.        Behavior: Behavior normal.        Thought Content: Thought content normal.        Judgment: Judgment normal.            Assessment & Plan:    15 minutes were spent face-to-face with the patient, over 50% of which was spent counseling regarding her physical pain, depression, anxiety and her dog who is thought to be an emotional support animal.  We have decided that no medication is necessary and that I will go ahead and write a letter for her to use her dog as an emotional support animal    See Problem List for Assessment and Plan of chronic medical problems.

## 2019-09-01 NOTE — Assessment & Plan Note (Signed)
She has mild anxiety and depression and only feels them intermittently She has never needed medication for this, but her dog is a huge emotional support for her for her depression and anxiety that she experiences and for her physical pain I believe her dog is an emotional support animal and I will write a letter for her so that she can have the dog with her at her new apartment

## 2019-09-01 NOTE — Assessment & Plan Note (Signed)
She has diffuse chronic pain from multiple areas Her dog is a huge comfort for her related to the pain and therefore is an emotional support animal I have agreed to write a letter stating so

## 2019-09-01 NOTE — Patient Instructions (Signed)
A letter was provided.

## 2019-09-04 LAB — MULTIPLE MYELOMA PANEL, SERUM
Albumin SerPl Elph-Mcnc: 3.4 g/dL (ref 2.9–4.4)
Albumin/Glob SerPl: 1.1 (ref 0.7–1.7)
Alpha 1: 0.2 g/dL (ref 0.0–0.4)
Alpha2 Glob SerPl Elph-Mcnc: 0.8 g/dL (ref 0.4–1.0)
B-Globulin SerPl Elph-Mcnc: 1.1 g/dL (ref 0.7–1.3)
Gamma Glob SerPl Elph-Mcnc: 1.2 g/dL (ref 0.4–1.8)
Globulin, Total: 3.3 g/dL (ref 2.2–3.9)
IgA: 391 mg/dL — ABNORMAL HIGH (ref 87–352)
IgG (Immunoglobin G), Serum: 1392 mg/dL (ref 586–1602)
IgM (Immunoglobulin M), Srm: 68 mg/dL (ref 26–217)
M Protein SerPl Elph-Mcnc: 0.3 g/dL — ABNORMAL HIGH
Total Protein ELP: 6.7 g/dL (ref 6.0–8.5)

## 2019-09-04 LAB — KAPPA/LAMBDA LIGHT CHAINS
Kappa free light chain: 40.2 mg/L — ABNORMAL HIGH (ref 3.3–19.4)
Kappa, lambda light chain ratio: 2.09 — ABNORMAL HIGH (ref 0.26–1.65)
Lambda free light chains: 19.2 mg/L (ref 5.7–26.3)

## 2019-09-07 ENCOUNTER — Ambulatory Visit: Payer: BC Managed Care – PPO | Admitting: Physical Medicine and Rehabilitation

## 2019-09-08 ENCOUNTER — Inpatient Hospital Stay (HOSPITAL_BASED_OUTPATIENT_CLINIC_OR_DEPARTMENT_OTHER): Payer: BC Managed Care – PPO | Admitting: Hematology and Oncology

## 2019-09-08 ENCOUNTER — Encounter
Payer: BC Managed Care – PPO | Attending: Physical Medicine and Rehabilitation | Admitting: Physical Medicine and Rehabilitation

## 2019-09-08 ENCOUNTER — Other Ambulatory Visit: Payer: Self-pay

## 2019-09-08 ENCOUNTER — Encounter: Payer: Self-pay | Admitting: Physical Medicine and Rehabilitation

## 2019-09-08 ENCOUNTER — Encounter: Payer: Self-pay | Admitting: Hematology and Oncology

## 2019-09-08 VITALS — BP 103/72 | HR 88 | Temp 97.3°F | Ht 68.0 in | Wt 309.0 lb

## 2019-09-08 DIAGNOSIS — M160 Bilateral primary osteoarthritis of hip: Secondary | ICD-10-CM | POA: Diagnosis not present

## 2019-09-08 DIAGNOSIS — M19072 Primary osteoarthritis, left ankle and foot: Secondary | ICD-10-CM

## 2019-09-08 DIAGNOSIS — Z791 Long term (current) use of non-steroidal anti-inflammatories (NSAID): Secondary | ICD-10-CM | POA: Diagnosis not present

## 2019-09-08 DIAGNOSIS — M549 Dorsalgia, unspecified: Secondary | ICD-10-CM | POA: Diagnosis not present

## 2019-09-08 DIAGNOSIS — M19071 Primary osteoarthritis, right ankle and foot: Secondary | ICD-10-CM | POA: Diagnosis not present

## 2019-09-08 DIAGNOSIS — G894 Chronic pain syndrome: Secondary | ICD-10-CM | POA: Diagnosis not present

## 2019-09-08 DIAGNOSIS — N182 Chronic kidney disease, stage 2 (mild): Secondary | ICD-10-CM | POA: Diagnosis not present

## 2019-09-08 DIAGNOSIS — Z79891 Long term (current) use of opiate analgesic: Secondary | ICD-10-CM | POA: Diagnosis not present

## 2019-09-08 DIAGNOSIS — M7062 Trochanteric bursitis, left hip: Secondary | ICD-10-CM | POA: Insufficient documentation

## 2019-09-08 DIAGNOSIS — Z5181 Encounter for therapeutic drug level monitoring: Secondary | ICD-10-CM | POA: Diagnosis not present

## 2019-09-08 DIAGNOSIS — M545 Low back pain, unspecified: Secondary | ICD-10-CM

## 2019-09-08 DIAGNOSIS — M05852 Other rheumatoid arthritis with rheumatoid factor of left hip: Secondary | ICD-10-CM | POA: Diagnosis not present

## 2019-09-08 DIAGNOSIS — G8929 Other chronic pain: Secondary | ICD-10-CM

## 2019-09-08 DIAGNOSIS — Z793 Long term (current) use of hormonal contraceptives: Secondary | ICD-10-CM | POA: Diagnosis not present

## 2019-09-08 DIAGNOSIS — M7918 Myalgia, other site: Secondary | ICD-10-CM | POA: Diagnosis not present

## 2019-09-08 DIAGNOSIS — D472 Monoclonal gammopathy: Secondary | ICD-10-CM

## 2019-09-08 DIAGNOSIS — M25519 Pain in unspecified shoulder: Secondary | ICD-10-CM | POA: Diagnosis not present

## 2019-09-08 DIAGNOSIS — Z79899 Other long term (current) drug therapy: Secondary | ICD-10-CM | POA: Diagnosis not present

## 2019-09-08 MED ORDER — DULOXETINE HCL 30 MG PO CPEP: 30.0000 mg | ORAL_CAPSULE | Freq: Every day | ORAL | 5 refills | Status: DC

## 2019-09-08 NOTE — Assessment & Plan Note (Signed)
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 I discussed with her the natural history of MGUS. Recent blood work showed no significant signs of disease progression She would need close follow-up once a year with history, blood work and examination. She agreed 

## 2019-09-08 NOTE — Progress Notes (Signed)
Subjective:    [name], female    DOB: 08/12/1971, 48 y.o.   MRN: [id number]  HPI  Pt is a 48 yr old R handed female with  RA and DJD who needs B/L knee replacements (BMI is ~40; goal <35 for knee surgery); with chronic low back pain and L hip and lateral hip pain c/w trochanteric bursitis.   CC- L hip pain   L hip/groin pain is much better. Done 2 weeks ago.  Still having pain in low back-   Can't afford theracane right now.  Has tried having husband to hold pressure on low back- helps a couple minutes later.  Hurts the most when cooking- go up and down- lays down because so much pain.   Taking Flexeril- yesterday took 3x- usually takes 1-3x/day-  Helps muscle tightness. Hasn't gotten tennis balls yet.  Back pain is a band across low back and can radiate locally to upper buttocks, but not gluteus maximus (not into main meat of buttocks).  No radiation down into legs. Did water aerobics in past- COVID closed it and hasn't been back.    Got xrays- they look pretty good overall.   Not DM-     Pain Inventory Average Pain 7 Pain Right Now 5 My pain is sharp  In the last 24 hours, has pain interfered with the following? General activity 3 Relation with others 0 Enjoyment of life 3 What TIME of day is your pain at its worst? varies with activity Sleep (in general) Fair  Pain is worse with: walking, sitting, inactivity, standing and some activites Pain improves with: medication and injections Relief from Meds: 5  Mobility walk without assistance walk with assistance use a walker  Function Do you have any goals in this area?  no  Neuro/Psych numbness spasms  Prior Studies Any changes since last visit?  no  Physicians involved in your care Any changes since last visit?  no   Family History  Problem Relation Age of Onset  . Arthritis Mother   . High blood pressure Mother   . Sleep apnea Mother   . High blood pressure Father   .  Breast cancer Paternal Aunt   . Arthritis Sister   . Healthy Daughter   . Healthy Son   . Adrenal disorder Neg Hx   . Colon cancer Neg Hx   . Esophageal cancer Neg Hx   . Pancreatic cancer Neg Hx   . Stomach cancer Neg Hx   . Liver disease Neg Hx    Social History   Socioeconomic History  . Marital status: Married    Spouse name: Journi Canipe  . Number of children: 2  . Years of education: Not on file  . Highest education level: Not on file  Occupational History  . Occupation: [employer] at [employer]: unemploed  Social Needs  . Financial resource strain: Not on file  . Food insecurity    Worry: Not on file    Inability: Not on file  . Transportation needs    Medical: Not on file    Non-medical: Not on file  Tobacco Use  . Smoking status: Current Every Day Smoker    Packs/day: 0.25    Years: 24.00    Pack years: 6.00    Types: Cigarettes  . Smokeless tobacco: Never Used  Substance and Sexual Activity  . Alcohol use: Yes    Alcohol/week: 2.0 standard drinks    Types:  2 Shots of liquor per week    Comment: per week  . Drug use: Yes    Types: Marijuana  . Sexual activity: Yes    Partners: Male    Birth control/protection: None  Lifestyle  . Physical activity    Days per week: Not on file    Minutes per session: Not on file  . Stress: Not on file  Relationships  . Social Herbalist on phone: Not on file    Gets together: Not on file    Attends religious service: Not on file    Active member of club or organization: Not on file    Attends meetings of clubs or organizations: Not on file    Relationship status: Not on file  Other Topics Concern  . Not on file  Social History Narrative   Lives with husband, Berneta Sages.   Past Surgical History:  Procedure Laterality Date  . BREAST DUCTAL SYSTEM EXCISION Bilateral 10/12/2017   Procedure: EXCISION OF BILATERAL CENTRAL DUCT;  Surgeon: Erroll Luna, MD;  Location: [facility];  Service: General;  Laterality:  Bilateral;  . BREAST EXCISIONAL BIOPSY    . CHOLECYSTECTOMY N/A 12/21/2015   Procedure: LAPAROSCOPIC CHOLECYSTECTOMY;  Surgeon: Georganna Skeans, MD;  Location: [facility];  Service: General;  Laterality: N/A;  . UMBILICAL HERNIA REPAIR N/A 12/27/2015   Procedure: INCARCERATED UMBILICAL HERNIA REPAIR;  Surgeon: Coralie Keens, MD;  Location: [facility];  Service: General;  Laterality: N/A;   Past Medical History:  Diagnosis Date  . Arthritis   . Gallstones   . GERD (gastroesophageal reflux disease)   . Hypertension   . MGUS (monoclonal gammopathy of unknown significance)   . Obesity   . Osteoarthritis   . PONV (postoperative nausea and vomiting)   . Rheumatoid arthritis ([place])   . Sleep apnea    SENT CPAP BACK, NOT WORN IN 7-8 MONTHS   BP 103/72   Pulse 88   Temp (!) 97.3 F (36.3 C)   Ht 5\' 8"  (1.727 m)   Wt (!) 309 lb (140.2 kg)   SpO2 96%   BMI 46.98 kg/m   Opioid Risk Score:   Fall Risk Score:  `1  Depression screen PHQ 2/9  Depression screen [facility] 2/9 08/08/2019 07/14/2019 03/17/2018 09/22/2017 09/16/2015 08/29/2015 05/28/2015  Decreased Interest 3 0 0 0 0 0 0  Down, Depressed, Hopeless 0 0 0 0 0 0 0  PHQ - 2 Score 3 0 0 0 0 0 0  Altered sleeping 1 - 0 - - - -  Tired, decreased energy 2 - 1 - - - -  Change in appetite 1 - 0 - - - -  Feeling bad or failure about yourself  0 - 0 - - - -  Trouble concentrating 0 - 0 - - - -  Moving slowly or fidgety/restless 0 - 0 - - - -  Suicidal thoughts 0 - 0 - - - -  PHQ-9 Score 7 - 1 - - - -  Difficult doing work/chores Not difficult at all - Not difficult at all - - - -    Review of Systems  HENT: Negative.   Eyes: Negative.   Respiratory: Negative.   Cardiovascular: Negative.   Gastrointestinal: Negative.   Endocrine: Negative.   Genitourinary: Negative.   Musculoskeletal: Positive for arthralgias and back pain.       Spasms   Allergic/Immunologic: Negative.   Neurological: Positive for numbness.  Hematological: Negative.    Psychiatric/Behavioral:  Negative.   All other systems reviewed and are negative.      Objective:   Physical Exam        Assessment & Plan:   Pt is a 48 yr old R handed female with  RA and DJD who needs B/L knee replacements (BMI is ~40; goal <35 for knee surgery); with chronic low back pain and L hip and lateral hip pain c/w trochanteric bursitis.   1..  Duloxetine /Cymbalta 30 mg nightly x 1 week  Then 60 mg nightly- for nerve pain  1% of patients can have nausea with Duloxetine- call me if needs an anti-nausea medicine. Can also cause mild dry mouth/dry eyes and mild constipation.  2. L lateral trochanteric bursa steroid injection- did consent- not allergic to components of injection or betadine- cleaned L lateral hip with iodine x 3- allowed ot dry- then injected after cleaning with alcohol- 1cc of 40 mg kenalog and 1cc of 1% lidocaine with no EPI into L lateral hip- used 27 gauge 1.5 inch needle- no bleeding or complications.  3. Tennis balls- 2-5 minutes in each spot- has to do at least 2 minutes- watch clock/watch- no more than 30 minutes/day.   4. F/U in 6-8 weeks  I spent a total of 45 minutes on appointment- 10 minutes doing injection; 25 minutes educating pt on myofascial release, again, and going over duxoletine.

## 2019-09-08 NOTE — Patient Instructions (Signed)
Pt is a 48 yr old R handed female with  RA and DJD who needs B/L knee replacements (BMI is ~40; goal <35 for knee surgery); with chronic low back pain and L hip and lateral hip pain c/w trochanteric bursitis.   1..  Duloxetine /Cymbalta 30 mg nightly x 1 week  Then 60 mg nightly- for nerve pain  1% of patients can have nausea with Duloxetine- call me if needs an anti-nausea medicine. Can also cause mild dry mouth/dry eyes and mild constipation.  2. L lateral trochanteric bursa steroid injection- did consent- not allergic to components of injection or betadine- cleaned L lateral hip with iodine x 3- allowed ot dry- then injected after cleaning with alcohol- 1cc of 40 mg kenalog and 1cc of 1% lidocaine with no EPI into L lateral hip- used 27 gauge 1.5 inch needle- no bleeding or complications.  3. Tennis balls- 2-5 minutes in each spot- has to do at least 2 minutes- watch clock/watch- no more than 30 minutes/day.   4. F/U in 6-8 weeks

## 2019-09-08 NOTE — Progress Notes (Signed)
[place] OFFICE PROGRESS NOTE  Patient Care Team: Binnie Rail, MD as PCP - General (Internal Medicine) Hennie Duos, MD as Consulting Physician (Rheumatology)  ASSESSMENT & PLAN:  MGUS (monoclonal gammopathy of unknown significance) Her blood work is most consistent with MGUS. I discussed with her the natural history of MGUS. Recent blood work showed no significant signs of disease progression She would need close follow-up once a year with history, blood work and examination. 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 Comprehensive metabolic panel    Standing Status:   Future    Standing Expiration Date:   10/12/2020  . CBC with Differential/Platelet    Standing Status:   Future    Standing Expiration Date:   10/12/2020  . Kappa/lambda light chains    Standing Status:   Future    Standing Expiration Date:   10/12/2020  . Multiple Myeloma Panel (SPEP&IFE w/QIG)    Standing Status:   Future    Standing Expiration Date:   10/12/2020    INTERVAL HISTORY: Please see below for problem oriented charting. She returns for further follow-up She feels well No recent infection, fever or chills She is up-to-date with vaccination Denies new bone pain  SUMMARY OF ONCOLOGIC HISTORY:  Alexandra Powers is here because of chronic joint pain and abnormal protein electrophoresis. The patient had problems with chronic back pain. She denies prior history of trauma. The pain has been present for over a year. She was prescribed intermittent doses of NSAID and Vicodin for this. The pains is located in the shoulder, the back, and the knees with associated morning stiffness. The patient had cholecystectomy several months ago and since then had postprandial nausea,  vomiting usually within 30 minutes and associated diarrhea 4-5 times a day with abdominal cramps. She have lost almost 40 pounds over the past few months. She was subsequently referred to rheumatologist. On 05/20/2016, blood work show high sedimentation rate of 67, C-reactive protein of 11.9, abnormal serum protein electrophoresis with detectable M spike but negative autoimmune screen with ANA. Hepatitis screen was negative. She denies history of abnormal bone fracture. Patient denies history of recurrent infection or atypical infections such as shingles of meningitis. Denies chills, night sweats or anorexia  In August 2017, she underwent extensive workup which excluded multiple myeloma. X-ray is most consistent with osteoarthritis and she have IgG lambda MGUS with polyclonal IgA   REVIEW OF SYSTEMS:   Constitutional: Denies fevers, chills or abnormal weight loss Eyes: Denies blurriness of vision Ears, nose, mouth, throat, and face: Denies mucositis or sore throat Respiratory: Denies cough, dyspnea or wheezes Cardiovascular: Denies palpitation, chest discomfort or lower extremity swelling Gastrointestinal:  Denies nausea, heartburn or change in bowel habits Skin: Denies abnormal skin rashes Lymphatics: Denies new lymphadenopathy or easy bruising Neurological:Denies numbness, tingling or new weaknesses Behavioral/Psych: Mood is stable, no new changes  All other systems were reviewed with the patient and are negative.  I have reviewed the past medical history, past surgical history, social history and family history with the patient and they are unchanged from previous note.  ALLERGIES:  has No Known Allergies.  MEDICATIONS:  Current Outpatient Medications  Medication Sig Dispense Refill  . amLODipine (NORVASC) 10 MG tablet Take 1 tablet (10 mg total) by  mouth daily. Needs office visit for more refills 90 tablet 1  . cyclobenzaprine (FLEXERIL) 10 MG tablet Take 1 tablet (10 mg total) by  mouth 2 (two) times daily as needed for muscle spasms. 60 tablet 5  . gabapentin (NEURONTIN) 300 MG capsule Take 1 capsule (300 mg total) by mouth at bedtime. 90 capsule 3  . hydrochlorothiazide (HYDRODIURIL) 25 MG tablet Take 1 tablet (25 mg total) by mouth daily. Needs office visit for more refills 90 tablet 1  . ibuprofen (ADVIL) 200 MG tablet Take 200 mg by mouth every 6 (six) hours as needed.    Marland Kitchen lisinopril (ZESTRIL) 40 MG tablet Take 1 tablet (40 mg total) by mouth daily. Needs OV for more refills 90 tablet 1  . medroxyPROGESTERone (PROVERA) 5 MG tablet Take one tablet po qd x 5 days every other month until you go 6 months without bleeding. Start on August 1st. 15 tablet 1  . naproxen sodium (ANAPROX DS) 550 MG tablet Take 1 tablet (550 mg total) by mouth 2 (two) times daily with a meal. Take prn pain 30 tablet 1  . tizanidine (ZANAFLEX) 6 MG capsule Take 1 capsule (6 mg total) by mouth 3 (three) times daily as needed for muscle spasms. 90 capsule 5   No current facility-administered medications for this visit.     PHYSICAL EXAMINATION: ECOG PERFORMANCE STATUS: 0 - Asymptomatic  Vitals:   09/08/19 1127  BP: 117/78  Pulse: 75  Resp: 18  Temp: (!) 97.4 F (36.3 C)  SpO2: 100%   Filed Weights   09/08/19 1127  Weight: (!) 310 lb (140.6 kg)    GENERAL:alert, no distress and comfortable Musculoskeletal:no cyanosis of digits and no clubbing  NEURO: alert & oriented x 3 with fluent speech, no focal motor/sensory deficits  LABORATORY DATA:  I have reviewed the data as listed    Component Value Date/Time   NA 142 09/01/2019 1105   NA 138 03/17/2018 1138   NA 142 08/05/2017 1057   K 3.5 09/01/2019 1105   K 2.7 (LL) 08/05/2017 1057   CL 107 09/01/2019 1105   CO2 27 09/01/2019 1105   CO2 33 (H) 08/05/2017 1057   GLUCOSE 107 (H) 09/01/2019 1105   GLUCOSE 103 08/05/2017 1057   BUN 12 09/01/2019 1105   BUN 12 03/17/2018 1138   BUN 31.2 (H) 08/05/2017 1057   CREATININE 1.12  (H) 09/01/2019 1105   CREATININE 1.11 (H) 07/06/2019 1010   CREATININE 1.8 (H) 08/05/2017 1057   CALCIUM 9.1 09/01/2019 1105   CALCIUM 10.4 08/05/2017 1057   PROT 7.2 09/01/2019 1105   PROT 7.3 03/17/2018 1138   PROT 8.3 08/05/2017 1057   ALBUMIN 3.4 (L) 09/01/2019 1105   ALBUMIN 3.9 03/17/2018 1138   ALBUMIN 3.2 (L) 08/05/2017 1057   AST 13 (L) 09/01/2019 1105   AST 18 08/05/2017 1057   ALT 7 09/01/2019 1105   ALT 10 08/05/2017 1057   ALKPHOS 76 09/01/2019 1105   ALKPHOS 62 08/05/2017 1057   BILITOT 0.3 09/01/2019 1105   BILITOT 0.4 03/17/2018 1138   BILITOT 0.46 08/05/2017 1057   GFRNONAA 58 (L) 09/01/2019 1105   GFRNONAA 58 (L) 07/06/2019 1010   GFRAA >60 09/01/2019 1105   GFRAA 68 07/06/2019 1010    No results found for: SPEP, UPEP  Lab Results  Component Value Date   WBC 8.9 09/01/2019   NEUTROABS 3.5 09/01/2019   HGB 12.1 09/01/2019   HCT 37.1 09/01/2019   MCV 99.5 09/01/2019  PLT 304 09/01/2019      Chemistry      Component Value Date/Time   NA 142 09/01/2019 1105   NA 138 03/17/2018 1138   NA 142 08/05/2017 1057   K 3.5 09/01/2019 1105   K 2.7 (LL) 08/05/2017 1057   CL 107 09/01/2019 1105   CO2 27 09/01/2019 1105   CO2 33 (H) 08/05/2017 1057   BUN 12 09/01/2019 1105   BUN 12 03/17/2018 1138   BUN 31.2 (H) 08/05/2017 1057   CREATININE 1.12 (H) 09/01/2019 1105   CREATININE 1.11 (H) 07/06/2019 1010   CREATININE 1.8 (H) 08/05/2017 1057      Component Value Date/Time   CALCIUM 9.1 09/01/2019 1105   CALCIUM 10.4 08/05/2017 1057   ALKPHOS 76 09/01/2019 1105   ALKPHOS 62 08/05/2017 1057   AST 13 (L) 09/01/2019 1105   AST 18 08/05/2017 1057   ALT 7 09/01/2019 1105   ALT 10 08/05/2017 1057   BILITOT 0.3 09/01/2019 1105   BILITOT 0.4 03/17/2018 1138   BILITOT 0.46 08/05/2017 1057       All questions were answered. The patient knows to call the clinic with any problems, questions or concerns. No barriers to learning was detected.  I spent 10  minutes counseling the patient face to face. The total time spent in the appointment was 15 minutes and more than 50% was on counseling and review of test results  Heath Lark, MD 09/08/2019 11:45 AM

## 2019-09-08 NOTE — Assessment & Plan Note (Signed)
She has mild chronic kidney disease stage II She is not symptomatic She will continue risk factor modification and close management by primary care doctor

## 2019-09-11 ENCOUNTER — Telehealth: Payer: Self-pay | Admitting: Hematology and Oncology

## 2019-09-11 NOTE — Telephone Encounter (Signed)
I talk with patient regarding schedule  

## 2019-09-26 ENCOUNTER — Encounter: Payer: Self-pay | Admitting: Internal Medicine

## 2019-11-01 ENCOUNTER — Encounter: Payer: BC Managed Care – PPO | Admitting: Physical Medicine and Rehabilitation

## 2019-11-03 ENCOUNTER — Ambulatory Visit: Payer: BC Managed Care – PPO | Admitting: Physical Medicine and Rehabilitation

## 2019-11-10 ENCOUNTER — Other Ambulatory Visit: Payer: Self-pay

## 2019-11-10 ENCOUNTER — Encounter
Payer: BC Managed Care – PPO | Attending: Physical Medicine and Rehabilitation | Admitting: Physical Medicine and Rehabilitation

## 2019-11-10 ENCOUNTER — Encounter: Payer: Self-pay | Admitting: Physical Medicine and Rehabilitation

## 2019-11-10 VITALS — BP 128/84 | HR 75 | Temp 97.3°F | Ht 68.0 in | Wt 326.0 lb

## 2019-11-10 DIAGNOSIS — M545 Low back pain, unspecified: Secondary | ICD-10-CM

## 2019-11-10 DIAGNOSIS — M7918 Myalgia, other site: Secondary | ICD-10-CM | POA: Diagnosis not present

## 2019-11-10 DIAGNOSIS — M05852 Other rheumatoid arthritis with rheumatoid factor of left hip: Secondary | ICD-10-CM | POA: Diagnosis not present

## 2019-11-10 DIAGNOSIS — N6001 Solitary cyst of right breast: Secondary | ICD-10-CM | POA: Diagnosis not present

## 2019-11-10 DIAGNOSIS — G8929 Other chronic pain: Secondary | ICD-10-CM

## 2019-11-10 DIAGNOSIS — N6002 Solitary cyst of left breast: Secondary | ICD-10-CM

## 2019-11-10 DIAGNOSIS — Z5181 Encounter for therapeutic drug level monitoring: Secondary | ICD-10-CM | POA: Insufficient documentation

## 2019-11-10 DIAGNOSIS — M160 Bilateral primary osteoarthritis of hip: Secondary | ICD-10-CM | POA: Diagnosis present

## 2019-11-10 DIAGNOSIS — G894 Chronic pain syndrome: Secondary | ICD-10-CM | POA: Diagnosis present

## 2019-11-10 DIAGNOSIS — N182 Chronic kidney disease, stage 2 (mild): Secondary | ICD-10-CM | POA: Diagnosis not present

## 2019-11-10 DIAGNOSIS — Z79891 Long term (current) use of opiate analgesic: Secondary | ICD-10-CM | POA: Insufficient documentation

## 2019-11-10 MED ORDER — DULOXETINE HCL 30 MG PO CPEP: 90.0000 mg | ORAL_CAPSULE | Freq: Every day | ORAL | 5 refills | Status: DC

## 2019-11-10 NOTE — Progress Notes (Signed)
Subjective:    Patient ID: Vanessa Kick, female    DOB: [birth year], 48 y.o.   MRN: [id number]  HPI  CC: low back pain in setting of RA  Back is killing her.  Has to bend up to pick up Poop- but as soon as starts doing anything- mopping, cleaning, doing dishes- back hurts to bad.  On Duloxetine- 60 mg nightly- helps toothache- not really helping back pain at all/much- brings back pain down 1-2 points, but as soon as gets up, pain back up to high doses.   Had a toothache-and can't afford to go to dentist.   Tramadol doesn't help at all. Hydrocodone lasts for a few hours.    [facility] to get [facility] from [facility]- was very helpful- last had it done February 2020.    Trochanteric bursa injection was done 10/2- and is just starting to come back. Hip steroid injection into actual hip worked- and hasn't worn off yet.  Taking Naproxen- has occ taking 3x/day-   Can't start [place] for RA- because breast cysts- has appointment for [facility] about cysts.   Pain Inventory Average Pain 9 Pain Right Now 6 My pain is sharp and dull  In the last 24 hours, has pain interfered with the following? General activity 8 Relation with others 7 Enjoyment of life 9 What TIME of day is your pain at its worst? alll Sleep (in general) Fair  Pain is worse with: walking, bending, sitting, inactivity and standing Pain improves with: therapy/exercise Relief from Meds: 5  Mobility how many minutes can you walk? 5-6 ability to climb steps?  no do you drive?  no  Function Do you have any goals in this area?  no  Neuro/Psych trouble walking spasms  Prior Studies Any changes since last visit?  no x-rays CT/MRI  Physicians involved in your care Primary care . Rheumatologist . Neurosurgeon .   Family History  Problem Relation Age of Onset  . Arthritis Mother   . High blood pressure Mother   . Sleep apnea Mother   . High blood pressure Father   . Breast cancer Paternal Aunt    . Arthritis Sister   . Healthy Daughter   . Healthy Son   . Adrenal disorder Neg Hx   . Colon cancer Neg Hx   . Esophageal cancer Neg Hx   . Pancreatic cancer Neg Hx   . Stomach cancer Neg Hx   . Liver disease Neg Hx    Social History   Socioeconomic History  . Marital status: Married    Spouse name: Nitika Wanninger  . Number of children: 2  . Years of education: Not on file  . Highest education level: Not on file  Occupational History  . Occupation: Stay at [place]: unemploed  Tobacco Use  . Smoking status: Current Every Day Smoker    Packs/day: 0.25    Years: 24.00    Pack years: 6.00    Types: Cigarettes  . Smokeless tobacco: Never Used  Substance and Sexual Activity  . Alcohol use: Yes    Alcohol/week: 2.0 standard drinks    Types: 2 Shots of liquor per week    Comment: per week  . Drug use: Yes    Types: Marijuana  . Sexual activity: Yes    Partners: Male    Birth control/protection: None  Other Topics Concern  . Not on file  Social History Narrative   Lives with husband, Berneta Sages.  Social Determinants of Health   Financial Resource Strain:   . Difficulty of Paying Living Expenses: Not on file  Food Insecurity:   . Worried About Charity fundraiser in the Last Year: Not on file  . Ran Out of Food in the Last Year: Not on file  Transportation Needs:   . Lack of Transportation (Medical): Not on file  . Lack of Transportation (Non-Medical): Not on file  Physical Activity:   . Days of Exercise per Week: Not on file  . Minutes of Exercise per Session: Not on file  Stress:   . Feeling of Stress : Not on file  Social Connections:   . Frequency of Communication with Friends and Family: Not on file  . Frequency of Social Gatherings with Friends and Family: Not on file  . Attends Religious Services: Not on file  . Active Member of Clubs or Organizations: Not on file  . Attends Archivist Meetings: Not on file  . Marital Status: Not on file     Past Surgical History:  Procedure Laterality Date  . BREAST DUCTAL SYSTEM EXCISION Bilateral 10/12/2017   Procedure: EXCISION OF BILATERAL CENTRAL DUCT;  Surgeon: Erroll Luna, MD;  Location: [place];  Service: General;  Laterality: Bilateral;  . BREAST EXCISIONAL BIOPSY    . CHOLECYSTECTOMY N/A 12/21/2015   Procedure: LAPAROSCOPIC CHOLECYSTECTOMY;  Surgeon: Georganna Skeans, MD;  Location: [facility];  Service: General;  Laterality: N/A;  . UMBILICAL HERNIA REPAIR N/A 12/27/2015   Procedure: INCARCERATED UMBILICAL HERNIA REPAIR;  Surgeon: Coralie Keens, MD;  Location: [facility];  Service: General;  Laterality: N/A;   Past Medical History:  Diagnosis Date  . Arthritis   . Gallstones   . GERD (gastroesophageal reflux disease)   . Hypertension   . MGUS (monoclonal gammopathy of unknown significance)   . Obesity   . Osteoarthritis   . PONV (postoperative nausea and vomiting)   . Rheumatoid arthritis ([place])   . Sleep apnea    SENT CPAP BACK, NOT WORN IN 7-8 MONTHS   BP 128/84   Pulse 75   Temp (!) 97.3 F (36.3 C)   Ht 5\' 8"  (1.727 m)   Wt (!) 326 lb (147.9 kg)   SpO2 98%   BMI 49.57 kg/m   Opioid Risk Score:   Fall Risk Score:  `1  Depression screen PHQ 2/9  Depression screen [facility] 2/9 08/08/2019 07/14/2019 03/17/2018 09/22/2017 09/16/2015 08/29/2015 05/28/2015  Decreased Interest 3 0 0 0 0 0 0  Down, Depressed, Hopeless 0 0 0 0 0 0 0  PHQ - 2 Score 3 0 0 0 0 0 0  Altered sleeping 1 - 0 - - - -  Tired, decreased energy 2 - 1 - - - -  Change in appetite 1 - 0 - - - -  Feeling bad or failure about yourself  0 - 0 - - - -  Trouble concentrating 0 - 0 - - - -  Moving slowly or fidgety/restless 0 - 0 - - - -  Suicidal thoughts 0 - 0 - - - -  PHQ-9 Score 7 - 1 - - - -  Difficult doing work/chores Not difficult at all - Not difficult at all - - - -    Review of Systems An entire ROS was completed and found ot be negative    Objective:   Physical Exam  Awake, alert, appropriate,  blue hair, NAD Is rocking back and forth TTP in band across  low back  Radiates into buttocks. TTP over piriformis as well B/L      Assessment & Plan:  Patient is a 48 yr old female with RA and low back pain and piriformis syndrome B/L- here for f/u.   1. Suggest naproxen or Ibuprofen 1x/day- - I suggest Naproxen 500 mg instead of over the counter dose 250 mg, but suggest 1x/day, not 2-3x/day.     2. Will send to Dr. Letta Pate- [facility] worked really well- lasted 4 months each time. -   3. Does smoke pot- Has tried CBD pol- didn't work- - cannot prescribe pan meds like controlled substance at this time.  4. Increase Duloxetine to 90 mg daily night OR day- take all at once. Pt feels like it's helping some, so will increase  To 90 mg daily.   5. Will keep trigger point injections for future since main pain is low back.   6. F/U in 6 weeks.   Spent a total of 25 minutes on appointment- more than 15 minutes going over options for pain control

## 2019-11-10 NOTE — Patient Instructions (Signed)
Patient is a 48 yr old female with RA and low back pain and piriformis syndrome B/L- here for f/u.   1. Suggest naproxen or Ibuprofen 1x/day- - I suggest Naproxen 500 mg instead of over the counter dose 250 mg, but suggest 1x/day, not 2-3x/day.     2. Will send to Dr. Letta Pate- did [facility]frequency ablation at [facility] worked really well- lasted 4 months each time. -   3. Does smoke pot- Has tried CBD pol- didn't work- - cannot prescribe pan meds like controlled substance at this time.  4. Increase Duloxetine to 90 mg daily night OR day- take all at once. Pt feels like it's helping some, so will increase  To 90 mg daily.   5. Will keep trigger point injections for future since main pain is low back.   6. F/U in 6 weeks.

## 2019-11-17 ENCOUNTER — Encounter (HOSPITAL_BASED_OUTPATIENT_CLINIC_OR_DEPARTMENT_OTHER): Payer: BC Managed Care – PPO | Admitting: Physical Medicine & Rehabilitation

## 2019-11-17 ENCOUNTER — Other Ambulatory Visit: Payer: Self-pay

## 2019-11-17 ENCOUNTER — Encounter: Payer: Self-pay | Admitting: Physical Medicine & Rehabilitation

## 2019-11-17 VITALS — BP 116/81 | HR 77 | Temp 98.0°F | Ht 68.0 in | Wt 320.0 lb

## 2019-11-17 DIAGNOSIS — M47817 Spondylosis without myelopathy or radiculopathy, lumbosacral region: Secondary | ICD-10-CM | POA: Diagnosis not present

## 2019-11-17 DIAGNOSIS — M7918 Myalgia, other site: Secondary | ICD-10-CM | POA: Diagnosis not present

## 2019-11-17 MED ORDER — DIAZEPAM 10 MG PO TABS: 10.0000 mg | ORAL_TABLET | Freq: Once | ORAL | 1 refills | Status: AC

## 2019-11-17 NOTE — Progress Notes (Signed)
Bilateral Lumbar L3, L4  medial branch blocks and L 5 dorsal ramus injection under fluoroscopic guidance  Indication: Lumbar pain which is not relieved by medication management or other conservative care and interfering with self-care and mobility.  Informed consent was obtained after describing risks and benefits of the procedure with the patient, this includes bleeding, infection, paralysis and medication side effects.  The patient wishes to proceed and has given written consent.  The patient was placed in prone position.  The lumbar area was marked and prepped with Betadine.  One mL of 1% lidocaine was injected into each of 6 areas into the skin and subcutaneous tissue.  Then a 22-gauge 5in spinal needle was inserted targeting the junction of the left S1 superior articular process and sacral ala junction. Needle was advanced under fluoroscopic guidance.  Bone contact was made.  Isovue 200 was injected x 0.5 mL demonstrating no intravascular uptake.  Then a solution  of 2% MPF lidocaine was injected x 0.5 mL.  Then the left L5 superior articular process in transverse process junction was targeted.  Bone contact was made.  Isovue 200 was injected x 0.5 mL demonstrating no intravascular uptake. Then a solution containing  2% MPF lidocaine was injected x 0.5 mL.  Then the left L4 superior articular process in transverse process junction was targeted.  Bone contact was made.  Isovue 200 was injected x 0.5 mL demonstrating no intravascular uptake.  Then a solution containing2% MPF lidocaine was injected x 0.5 mL.  This same procedure was performed on the right side using the same needle, technique and injectate.  Patient tolerated procedure well.  Post procedure instructions were given. Preinjection pain level is 8/10 postinjection pain level is 0/10 Would recommend right L3 right L4 medial branch and right L5 dorsal ramus radiofrequency neurotomy in 4 weeks.  Will premedicate with Valium 10 mg p.o. Discussed  with patient agrees with plan

## 2019-11-17 NOTE — Progress Notes (Signed)
  [facility]   Name: Alexandra Powers DOB:[birth year] MRN: [id number]  Date:11/17/2019  Physician: Alysia Penna, MD    Nurse/[name]: Shameca Landen [name]  Allergies: No Known Allergies  Consent Signed: Yes.    Is patient diabetic? No.  CBG today? NA  Pregnant: No. LMP: No LMP recorded. Patient is perimenopausal. (age 49-55)  Anticoagulants: no Anti-inflammatory: no Antibiotics: no  Procedure: Medial Branch Blocks Position: Prone   Start Time: 300pm End Time: 315pm Fluoro Time: 73in   RN/[name] Nonie Lochner [name] Paula Busenbark [name]    Time 235 321pm    BP 116/81 139/95    Pulse 77 75    Respirations 16 16    O2 Sat 98 97    S/S 6 6    Pain Level 8/10 0/10     D/C home with Husband, patient A & O X 3, D/C instructions reviewed, and sits independently.

## 2019-12-08 ENCOUNTER — Encounter: Payer: BC Managed Care – PPO | Admitting: Physical Medicine and Rehabilitation

## 2019-12-11 ENCOUNTER — Encounter
Payer: BC Managed Care – PPO | Attending: Physical Medicine and Rehabilitation | Admitting: Physical Medicine and Rehabilitation

## 2019-12-11 DIAGNOSIS — M7918 Myalgia, other site: Secondary | ICD-10-CM | POA: Insufficient documentation

## 2019-12-11 DIAGNOSIS — G894 Chronic pain syndrome: Secondary | ICD-10-CM | POA: Insufficient documentation

## 2019-12-11 DIAGNOSIS — Z5181 Encounter for therapeutic drug level monitoring: Secondary | ICD-10-CM | POA: Insufficient documentation

## 2019-12-11 DIAGNOSIS — M05852 Other rheumatoid arthritis with rheumatoid factor of left hip: Secondary | ICD-10-CM | POA: Insufficient documentation

## 2019-12-11 DIAGNOSIS — G8929 Other chronic pain: Secondary | ICD-10-CM | POA: Insufficient documentation

## 2019-12-11 DIAGNOSIS — M545 Low back pain: Secondary | ICD-10-CM | POA: Insufficient documentation

## 2019-12-11 DIAGNOSIS — M160 Bilateral primary osteoarthritis of hip: Secondary | ICD-10-CM | POA: Insufficient documentation

## 2019-12-11 DIAGNOSIS — Z79891 Long term (current) use of opiate analgesic: Secondary | ICD-10-CM | POA: Insufficient documentation

## 2019-12-15 ENCOUNTER — Encounter: Payer: BC Managed Care – PPO | Admitting: Physical Medicine & Rehabilitation

## 2020-01-03 NOTE — Progress Notes (Deleted)
49 y.o. G2P0202 Married Black or [place] American Not Hispanic or Latino female here for annual exam.      No LMP recorded. Patient is perimenopausal.          Sexually active: {yes no:314532}  The current method of family planning is {contraception:315051}.    Exercising: {yes no:314532}  {types:19826} Smoker:  {YES NO:22349}  Health Maintenance: Pap:01/10/19  ACUS + HPV 11/17/2017 normal with positive HPV, negative 16/18/45 History of abnormal Pap:  yes MMG:  01/25/19 Category 2 benign  BMD:   Never  Colonoscopy: never  TDaP:  08/29/15 Gardasil: NA   reports that she has been smoking cigarettes. She has a 6.00 pack-year smoking history. She has never used smokeless tobacco. She reports current alcohol use of about 2.0 standard drinks of alcohol per week. She reports current drug use. Drug: Marijuana.  Past Medical History:  Diagnosis Date  . Arthritis   . Gallstones   . GERD (gastroesophageal reflux disease)   . Hypertension   . MGUS (monoclonal gammopathy of unknown significance)   . Obesity   . Osteoarthritis   . PONV (postoperative nausea and vomiting)   . Rheumatoid arthritis ([place])   . Sleep apnea    SENT CPAP BACK, NOT WORN IN 7-8 MONTHS    Past Surgical History:  Procedure Laterality Date  . BREAST DUCTAL SYSTEM EXCISION Bilateral 10/12/2017   Procedure: EXCISION OF BILATERAL CENTRAL DUCT;  Surgeon: Erroll Luna, MD;  Location: [facility];  Service: General;  Laterality: Bilateral;  . BREAST EXCISIONAL BIOPSY    . CHOLECYSTECTOMY N/A 12/21/2015   Procedure: LAPAROSCOPIC CHOLECYSTECTOMY;  Surgeon: Georganna Skeans, MD;  Location: [facility];  Service: General;  Laterality: N/A;  . UMBILICAL HERNIA REPAIR N/A 12/27/2015   Procedure: INCARCERATED UMBILICAL HERNIA REPAIR;  Surgeon: Coralie Keens, MD;  Location: [facility];  Service: General;  Laterality: N/A;    Current Outpatient Medications  Medication Sig Dispense Refill  . amLODipine (NORVASC) 10 MG tablet Take 1 tablet (10 mg  total) by mouth daily. Needs office visit for more refills 90 tablet 1  . cyclobenzaprine (FLEXERIL) 10 MG tablet Take 1 tablet (10 mg total) by mouth 2 (two) times daily as needed for muscle spasms. 60 tablet 5  . DULoxetine (CYMBALTA) 30 MG capsule Take 3 capsules (90 mg total) by mouth at bedtime. X 1 week then 60 mg Nightly- for nerve/back pain 90 capsule 5  . gabapentin (NEURONTIN) 300 MG capsule Take 1 capsule (300 mg total) by mouth at bedtime. 90 capsule 3  . hydrochlorothiazide (HYDRODIURIL) 25 MG tablet Take 1 tablet (25 mg total) by mouth daily. Needs office visit for more refills 90 tablet 1  . lisinopril (ZESTRIL) 40 MG tablet Take 1 tablet (40 mg total) by mouth daily. Needs OV for more refills 90 tablet 1  . medroxyPROGESTERone (PROVERA) 5 MG tablet Take one tablet po qd x 5 days every other month until you go 6 months without bleeding. Start on August 1st. 15 tablet 1  . naproxen sodium (ANAPROX DS) 550 MG tablet Take 1 tablet (550 mg total) by mouth 2 (two) times daily with a meal. Take prn pain 30 tablet 1  . tizanidine (ZANAFLEX) 6 MG capsule Take 1 capsule (6 mg total) by mouth 3 (three) times daily as needed for muscle spasms. 90 capsule 5   No current facility-administered medications for this visit.    Family History  Problem Relation Age of Onset  . Arthritis Mother   . High  blood pressure Mother   . Sleep apnea Mother   . High blood pressure Father   . Breast cancer Paternal Aunt   . Arthritis Sister   . Healthy Daughter   . Healthy Son   . Adrenal disorder Neg Hx   . Colon cancer Neg Hx   . Esophageal cancer Neg Hx   . Pancreatic cancer Neg Hx   . Stomach cancer Neg Hx   . Liver disease Neg Hx     Review of Systems  Exam:   There were no vitals taken for this visit.  Weight change: @WEIGHTCHANGE @ Height:      Ht Readings from Last 3 Encounters:  11/17/19 5\' 8"  (1.727 m)  11/10/19 5\' 8"  (1.727 m)  09/08/19 5\' 8"  (1.727 m)    General appearance: alert,  cooperative and appears stated age Head: Normocephalic, without obvious abnormality, atraumatic Neck: no adenopathy, supple, symmetrical, trachea midline and thyroid {CHL AMB PHY EX THYROID NORM DEFAULT:[phone]::"normal to inspection and palpation"} Lungs: clear to auscultation bilaterally Cardiovascular: regular rate and rhythm Breasts: {Exam; breast:13139::"normal appearance, no masses or tenderness"} Abdomen: soft, non-tender; non distended,  no masses,  no organomegaly Extremities: extremities normal, atraumatic, no cyanosis or edema Skin: Skin color, texture, turgor normal. No rashes or lesions Lymph nodes: Cervical, supraclavicular, and axillary nodes normal. No abnormal inguinal nodes palpated Neurologic: Grossly normal   Pelvic: External genitalia:  no lesions              Urethra:  normal appearing urethra with no masses, tenderness or lesions              Bartholins and Skenes: normal                 Vagina: normal appearing vagina with normal color and discharge, no lesions              Cervix: {CHL AMB PHY EX CERVIX NORM DEFAULT:[phone]::"no lesions"}               Bimanual Exam:  Uterus:  {CHL AMB PHY EX UTERUS NORM DEFAULT:[phone]::"normal size, contour, position, consistency, mobility, non-tender"}              Adnexa: {CHL AMB PHY EX ADNEXA NO MASS DEFAULT:[id number]::"no mass, fullness, tenderness"}               Rectovaginal: Confirms               Anus:  normal sphincter tone, no lesions  *** chaperoned for the exam.  A:  Well Woman with normal exam  P:

## 2020-01-04 ENCOUNTER — Ambulatory Visit: Payer: BC Managed Care – PPO | Admitting: Obstetrics and Gynecology

## 2020-01-04 ENCOUNTER — Encounter: Payer: Self-pay | Admitting: Obstetrics and Gynecology

## 2020-01-19 ENCOUNTER — Encounter: Payer: BC Managed Care – PPO | Admitting: Physical Medicine & Rehabilitation

## 2020-03-05 ENCOUNTER — Telehealth: Payer: Self-pay | Admitting: *Deleted

## 2020-03-05 DIAGNOSIS — Z01419 Encounter for gynecological examination (general) (routine) without abnormal findings: Secondary | ICD-10-CM

## 2020-03-05 NOTE — Telephone Encounter (Signed)
Call to patient. Patient in 08 recall for 02/21 and advised patient need to schedule aex. Patient declines at this time, states she does not currently have insurance. Not interested in self-pay. States she has applied for disability and is hoping to hear something soon so she can get medicaid. Patient states she would like to wait to schedule until she has insurance. RN advised would update provider. Patient agreeable.   Routing to provider to review and advise on recall status.

## 2020-03-05 NOTE — Telephone Encounter (Signed)
Keep in recall, check in on her in 6 months if she hasn't scheduled an appointment. Check with Gay Filler about her other options for care.

## 2020-03-06 NOTE — Telephone Encounter (Signed)
Spoke with Gay Filler, RN. Placed call to pt. Spoke with pt.   Pt can have referral to [facility] hospital clinic so she can keep on track with her AEX that is due while waiting to get disability and [employer]. Pt agreeable and thankful for this option.   Routing to Dr Talbert Nan for review.  Cc: Rosa for referral.  Encounter closed.

## 2020-05-17 ENCOUNTER — Telehealth: Payer: Self-pay

## 2020-05-17 NOTE — Telephone Encounter (Signed)
Patient is scheduled to be seen at [facility] for her pap smear and mammogram this year as she is in the process of obtaining medicaid. Removed from recall.  Routing to provider and will close encounter.

## 2020-05-22 ENCOUNTER — Other Ambulatory Visit: Payer: Self-pay

## 2020-05-22 DIAGNOSIS — N644 Mastodynia: Secondary | ICD-10-CM

## 2020-05-22 NOTE — Addendum Note (Signed)
Addended by: Demetrius Revel on: 05/22/2020 11:42 AM   Modules accepted: Orders

## 2020-06-04 ENCOUNTER — Ambulatory Visit: Payer: Self-pay

## 2020-06-05 ENCOUNTER — Other Ambulatory Visit: Payer: Self-pay

## 2020-07-02 ENCOUNTER — Ambulatory Visit: Payer: Self-pay

## 2020-07-04 ENCOUNTER — Other Ambulatory Visit: Payer: Self-pay

## 2020-07-04 ENCOUNTER — Ambulatory Visit: Payer: Self-pay

## 2020-07-04 ENCOUNTER — Ambulatory Visit
Admission: RE | Admit: 2020-07-04 | Discharge: 2020-07-04 | Disposition: A | Discharge status: Home / Self Care | Payer: Self-pay | Source: Ambulatory Visit | Attending: Obstetrics and Gynecology | Admitting: Obstetrics and Gynecology

## 2020-07-04 DIAGNOSIS — N644 Mastodynia: Secondary | ICD-10-CM

## 2020-07-18 ENCOUNTER — Other Ambulatory Visit: Payer: Self-pay | Admitting: Obstetrics and Gynecology

## 2020-07-18 ENCOUNTER — Ambulatory Visit: Payer: Self-pay | Admitting: *Deleted

## 2020-07-18 ENCOUNTER — Other Ambulatory Visit: Payer: Self-pay

## 2020-07-18 ENCOUNTER — Ambulatory Visit
Admission: RE | Admit: 2020-07-18 | Discharge: 2020-07-18 | Disposition: A | Discharge status: Home / Self Care | Payer: No Typology Code available for payment source | Source: Ambulatory Visit | Attending: Obstetrics and Gynecology | Admitting: Obstetrics and Gynecology

## 2020-07-18 ENCOUNTER — Ambulatory Visit
Admission: RE | Admit: 2020-07-18 | Discharge: 2020-07-18 | Disposition: A | Discharge status: Home / Self Care | Payer: Self-pay | Source: Ambulatory Visit | Attending: Obstetrics and Gynecology | Admitting: Obstetrics and Gynecology

## 2020-07-18 VITALS — BP 152/98 | Temp 97.1°F | Wt 348.8 lb

## 2020-07-18 DIAGNOSIS — Z01419 Encounter for gynecological examination (general) (routine) without abnormal findings: Secondary | ICD-10-CM

## 2020-07-18 DIAGNOSIS — N644 Mastodynia: Secondary | ICD-10-CM

## 2020-07-18 NOTE — Progress Notes (Signed)
Ms. Alexandra Powers is a 49 y.o. [id number] female who presents to [facility] clinic today with complaints of left breast and nipple pain.    Pap Smear: Pap smear completed today. Last Pap smear was 01/10/2019 at [facility] clinic and was ASCUS with positive HPV. Per patient has history of a normal Pap smear with positive HPV in 2018. 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 MD recommended follow up Pap in one year from then. Last Pap smear result is available in Epic.   Physical exam: Breasts Breasts symmetrical. Old surgical scar noted on left areola. No nipple retraction bilateral breasts. No nipple discharge bilateral breasts. No lymphadenopathy. No lumps palpated bilateral breasts. Patient complained of generalized pain in the left breast during exam.      Pelvic/Bimanual Ext Genitalia No lesions, no swelling and no discharge observed on external genitalia.        Vagina Vagina pink and normal texture. No lesions or discharge observed in vagina.        Cervix Cervix is present. Cervix pink and of normal texture. No discharge observed.    Uterus Uterus is present and palpable. Uterus in normal position and normal size.        Adnexae Bilateral ovaries present and palpable. No tenderness on palpation.         Rectovaginal No rectal exam completed today since patient had no rectal complaints. No skin abnormalities observed on exam.     Smoking History: Patient is a current smoker. She was referred to the [facility] Quit line.    Patient Navigation: Patient education provided. Access to services provided for patient through [facility] program.   Colorectal Cancer Screening: Patient has never had a colonoscopy. Per patient had FIT testing on 01/09/2019 and it was negative. No complaints today.    Breast and Cervical Cancer Risk Assessment: Patient does not have family history of breast cancer, known genetic mutations, or radiation  treatment to the chest before age 70. Patient has history of cervical dysplasia. She is not immunocompromised and has no known DES exposure in-utero.  Risk Assessment    Risk Scores      07/18/2020   Last edited by: Demetrius Revel, LPN   5-year risk: 1.1 %   Lifetime risk: 8.6 %          A: B[facility]CP exam with pap smear Complaints of left breast and nipple pain.  P: Referred patient to the [facility] for a diagnostic mammogram. Appointment scheduled July 18, 2020 at 12:40pm.  Vania Rea, RN, FNP student 07/18/2020 12:35 PM   Attestation of Supervision of Student:  I confirm that I have verified the information documented in the nurse practitioner student's note and that I have also personally reperformed the history, physical exam and all medical decision making activities.  I have verified that all services and findings are accurately documented in this student's note; and I agree with management and plan as outlined in the documentation. I have also made any necessary editorial changes.  [place], [place] for [employer], [place] [employer] 07/18/2020 1:34 PM

## 2020-07-18 NOTE — Patient Instructions (Addendum)
Informed Alexandra Powers about breast self awareness. Patient received a Pap smear today. Let patient know that next Pap smear will be due based on the result of today's Pap smear. Referred patient to the [facility] for diagnostic mammogram. Appointment scheduled for July 18, 2020 at 12:40pm. Patient aware of appointment and will be there. Let patient know will follow up with her within the next couple weeks with results of her Pap smear by letter or phone. [facility] verbalized understanding.  Vania Rea, RN, FNP student 12:42 PM

## 2020-07-23 LAB — CYTOLOGY - PAP
Comment: NEGATIVE
Diagnosis: UNDETERMINED — AB
High risk HPV: NEGATIVE

## 2020-07-24 ENCOUNTER — Telehealth: Payer: Self-pay

## 2020-07-24 NOTE — Telephone Encounter (Addendum)
-----   Message from Mora Bellman, MD sent at 07/24/2020  1:15 PM EDT ----- Repeat pap smear in 1 year  Patient informed Pap results ASC-US with negative HPV. Patient states she had elevated BP at her 07/18/2020 B[facility]CP appointment, was told would be referred to a physician, no called received with MD information,  has not taken BP meds in 4-5 months, has headaches, light-headedness that come and go, needs to be seen. I contacted Dr. Billey Gosling' office, and per Amy, patient was scheduled for appointment on 07/29/2020 @ 9:15am. Patient informed about appointment. Patient also informed to go to ED or call 911 immediately if she has shortness of breath, increase/constant  Headache, chest pain, pain in the jaw, pain across the shoulders/upper back/arms, decreased urination, dizziness, seeing spots, symptoms similar to indigestion. Patient states she has been having some pain in the shoulders off and on,not sure if related, will go to ED if she feels the need to do so. Patient informed that Dr. Quay Burow' will be contacting her Friday to discuss appointment and financial information ($80 deposit). Patient informed to check the [email or web address] website for financial assistance form. Patient informed that we value her life and health, will not be turned away at the ED for not having any insurance coverage. Patient verbalized understanding and agreed to seek care if symptoms worsen or new symptoms appear.

## 2020-07-24 NOTE — Progress Notes (Signed)
What are the recommendations for this patient? 01/10/2019 Pap-LSIL/HPV+, 01/19/2019-Colp-Benign, ECC-CIN I/Mild squamous dysplasia, 04/06/2019 ECC-Benign. All completed at [facility], CNM.   Thanks,  [employer]

## 2020-07-28 NOTE — Progress Notes (Signed)
Subjective:    [name], female    DOB: 08/04/1971, 49 y.o.   MRN: [id number]  HPI The patient is here for follow up of their chronic medical problems, including htn, ckd, anxiety, depression, vit d def, obesity.      Medications and allergies reviewed with patient and updated if appropriate.  Patient Active Problem List   Diagnosis Date Noted  . CKD (chronic kidney disease), stage II 09/08/2019  . Trochanteric bursitis of left hip 09/08/2019  . Anxiety and depression 09/01/2019  . Chronic pain syndrome 08/08/2019  . Chronic myofascial pain 08/08/2019  . Bilateral breast cysts 08/01/2019  . Cellulitis of left breast 07/14/2019  . 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 Primary osteoarthritis of both hips 07/29/2018  . Primary osteoarthritis of both knees 07/29/2018  . Primary osteoarthritis of both feet 07/29/2018  . Tonsillitis 05/02/2018  . Acute prerenal failure ([place]) 09/03/2017  . OSA (obstructive sleep apnea) 12/10/2016  . GERD (gastroesophageal reflux disease) 12/04/2016  . Low back pain at multiple sites 08/21/2016  . Chronic pain of multiple joints 08/13/2016  . MGUS (monoclonal gammopathy of unknown significance) 07/02/2016  . Vitamin D deficiency 07/02/2016  . HTN (hypertension), benign 11/11/2015  . Morbid obesity ([place]) 11/11/2015  . Adrenal mass ([place]) 08/30/2015  . Ovarian cyst 08/30/2015    Current Outpatient Medications on File Prior to Visit  Medication Sig Dispense Refill  . amLODipine (NORVASC) 10 MG tablet Take 1 tablet (10 mg total) by mouth daily. Needs office visit for more refills 90 tablet 1  . cyclobenzaprine (FLEXERIL) 10 MG tablet Take 1 tablet (10 mg total) by mouth 2 (two) times daily as needed for muscle spasms. 60 tablet 5  . DULoxetine (CYMBALTA) 30 MG capsule Take 3 capsules (90 mg total) by mouth at bedtime. X 1 week then 60 mg Nightly- for nerve/back pain 90 capsule 5  .  gabapentin (NEURONTIN) 300 MG capsule Take 1 capsule (300 mg total) by mouth at bedtime. 90 capsule 3  . hydrochlorothiazide (HYDRODIURIL) 25 MG tablet Take 1 tablet (25 mg total) by mouth daily. Needs office visit for more refills 90 tablet 1  . lisinopril (ZESTRIL) 40 MG tablet Take 1 tablet (40 mg total) by mouth daily. Needs OV for more refills 90 tablet 1  . medroxyPROGESTERone (PROVERA) 5 MG tablet Take one tablet po qd x 5 days every other month until you go 6 months without bleeding. Start on August 1st. 15 tablet 1  . naproxen sodium (ANAPROX DS) 550 MG tablet Take 1 tablet (550 mg total) by mouth 2 (two) times daily with a meal. Take prn pain 30 tablet 1  . tizanidine (ZANAFLEX) 6 MG capsule Take 1 capsule (6 mg total) by mouth 3 (three) times daily as needed for muscle spasms. 90 capsule 5   No current facility-administered medications on file prior to visit.    Past Medical History:  Diagnosis Date  . Arthritis   . Gallstones   . GERD (gastroesophageal reflux disease)   . Hypertension   . MGUS (monoclonal gammopathy of unknown significance)   . Obesity   . Osteoarthritis   . PONV (postoperative nausea and vomiting)   . Rheumatoid arthritis ([place])   . Sleep apnea    SENT CPAP BACK, NOT WORN IN 7-8 MONTHS    Past Surgical History:  Procedure Laterality Date  . BREAST DUCTAL SYSTEM EXCISION Bilateral 10/12/2017   Procedure: EXCISION  OF BILATERAL CENTRAL DUCT;  Surgeon: Erroll Luna, MD;  Location: [place];  Service: General;  Laterality: Bilateral;  . BREAST EXCISIONAL BIOPSY    . CHOLECYSTECTOMY N/A 12/21/2015   Procedure: LAPAROSCOPIC CHOLECYSTECTOMY;  Surgeon: Georganna Skeans, MD;  Location: [place];  Service: General;  Laterality: N/A;  . UMBILICAL HERNIA REPAIR N/A 12/27/2015   Procedure: INCARCERATED UMBILICAL HERNIA REPAIR;  Surgeon: Coralie Keens, MD;  Location: [facility];  Service: General;  Laterality: N/A;    Social History   Socioeconomic History  . Marital  status: Married    Spouse name: Mekia Dipinto  . Number of children: 2  . Years of education: Not on file  . Highest education level: High school graduate  Occupational History  . Occupation: [employer] at [employer]: unemploed  Tobacco Use  . Smoking status: Current Every Day Smoker    Packs/day: 0.25    Years: 24.00    Pack years: 6.00    Types: Cigarettes  . Smokeless tobacco: Never Used  Vaping Use  . Vaping Use: Never used  Substance and Sexual Activity  . Alcohol use: Yes    Alcohol/week: 2.0 standard drinks    Types: 2 Shots of liquor per week    Comment: per week  . Drug use: Yes    Types: Marijuana  . Sexual activity: Yes    Partners: Male    Birth control/protection: None  Other Topics Concern  . Not on file  Social History Narrative   Lives with husband, Berneta Sages.   Social Determinants of Health   Financial Resource Strain:   . Difficulty of Paying Living Expenses: Not on file  Food Insecurity:   . Worried About Charity fundraiser in the Last Year: Not on file  . Ran Out of Food in the Last Year: Not on file  Transportation Needs: No Transportation Needs  . Lack of Transportation (Medical): No  . Lack of Transportation (Non-Medical): No  Physical Activity:   . Days of Exercise per Week: Not on file  . Minutes of Exercise per Session: Not on file  Stress:   . Feeling of Stress : Not on file  Social Connections:   . Frequency of Communication with Friends and Family: Not on file  . Frequency of Social Gatherings with Friends and Family: Not on file  . Attends Religious Services: Not on file  . Active Member of Clubs or Organizations: Not on file  . Attends Archivist Meetings: Not on file  . Marital Status: Not on file    Family History  Problem Relation Age of Onset  . Arthritis Mother   . High blood pressure Mother   . Sleep apnea Mother   . High blood pressure Father   . Breast cancer Paternal Aunt   . Arthritis Sister   . Healthy  Daughter   . Healthy Son   . Adrenal disorder Neg Hx   . Colon cancer Neg Hx   . Esophageal cancer Neg Hx   . Pancreatic cancer Neg Hx   . Stomach cancer Neg Hx   . Liver disease Neg Hx     Review of Systems     Objective:  There were no vitals filed for this visit. BP Readings from Last 3 Encounters:  07/18/20 (!) 152/98  11/17/19 116/81  11/10/19 128/84   Wt Readings from Last 3 Encounters:  07/18/20 (!) 348 lb 12.8 oz (158.2 kg)  11/17/19 (!) 320 lb (145.2 kg)  11/10/19 (!) 326 lb (147.9 kg)   There is no height or weight on file to calculate BMI.   Physical Exam    Constitutional: Appears well-developed and well-nourished. No distress.  HENT:  Head: Normocephalic and atraumatic.  Neck: Neck supple. No tracheal deviation present. No thyromegaly present.  No cervical lymphadenopathy Cardiovascular: Normal rate, regular rhythm and normal heart sounds.   No murmur heard. No carotid bruit .  No edema Pulmonary/Chest: Effort normal and breath sounds normal. No respiratory distress. No has no wheezes. No rales.  Skin: Skin is warm and dry. Not diaphoretic.  Psychiatric: Normal mood and affect. Behavior is normal.      Assessment & Plan:    See Problem List for Assessment and Plan of chronic medical problems.    This visit occurred during the SARS-CoV-2 public health emergency.  Safety protocols were in place, including screening questions prior to the visit, additional usage of staff PPE, and extensive cleaning of exam room while observing appropriate contact time as indicated for disinfecting solutions.    This encounter was created in error - please disregard.

## 2020-07-28 NOTE — Patient Instructions (Signed)
  Blood work was ordered.     Medications reviewed and updated.  Changes include :     Your prescription(s) have been submitted to your pharmacy. Please take as directed and contact our office if you believe you are having problem(s) with the medication(s).  A referral was ordered for        Someone from their office will call you to schedule an appointment.    Please followup in 6 months   

## 2020-07-29 ENCOUNTER — Encounter: Payer: No Typology Code available for payment source | Admitting: Internal Medicine

## 2020-07-29 DIAGNOSIS — Z0289 Encounter for other administrative examinations: Secondary | ICD-10-CM

## 2020-08-26 ENCOUNTER — Telehealth: Payer: Self-pay | Admitting: Internal Medicine

## 2020-08-26 NOTE — Telephone Encounter (Signed)
Copied from [place] [id number]. Topic: General - Inquiry >> Aug 21, 2020  7:55 AM Lennox Solders wrote: Reason for CRM: Pt is calling and would like to apply for orange card

## 2020-08-26 NOTE — Telephone Encounter (Signed)
I return Pt call, and was inform that she need to be establish care with the PCP before she can schedule a financial appt

## 2020-09-02 ENCOUNTER — Inpatient Hospital Stay: Payer: No Typology Code available for payment source | Attending: Hematology and Oncology

## 2020-09-02 ENCOUNTER — Other Ambulatory Visit: Payer: Self-pay

## 2020-09-02 DIAGNOSIS — N183 Chronic kidney disease, stage 3 unspecified: Secondary | ICD-10-CM | POA: Insufficient documentation

## 2020-09-02 DIAGNOSIS — D472 Monoclonal gammopathy: Secondary | ICD-10-CM | POA: Insufficient documentation

## 2020-09-02 DIAGNOSIS — Z23 Encounter for immunization: Secondary | ICD-10-CM | POA: Insufficient documentation

## 2020-09-02 DIAGNOSIS — Z793 Long term (current) use of hormonal contraceptives: Secondary | ICD-10-CM | POA: Insufficient documentation

## 2020-09-02 DIAGNOSIS — Z79899 Other long term (current) drug therapy: Secondary | ICD-10-CM | POA: Insufficient documentation

## 2020-09-02 DIAGNOSIS — I10 Essential (primary) hypertension: Secondary | ICD-10-CM | POA: Insufficient documentation

## 2020-09-02 LAB — COMPREHENSIVE METABOLIC PANEL
ALT: 14 U/L (ref 0–44)
AST: 19 U/L (ref 15–41)
Albumin: 3.3 g/dL — ABNORMAL LOW (ref 3.5–5.0)
Alkaline Phosphatase: 77 U/L (ref 38–126)
Anion gap: 7 (ref 5–15)
BUN: 12 mg/dL (ref 6–20)
CO2: 26 mmol/L (ref 22–32)
Calcium: 9.4 mg/dL (ref 8.9–10.3)
Chloride: 108 mmol/L (ref 98–111)
Creatinine, Ser: 1.2 mg/dL — ABNORMAL HIGH (ref 0.44–1.00)
GFR calc Af Amer: >60 mL/min (ref >60)
GFR calc non Af Amer: 53 mL/min — ABNORMAL LOW (ref >60)
Glucose, Bld: 88 mg/dL (ref 70–99)
Potassium: 3.9 mmol/L (ref 3.5–5.1)
Sodium: 141 mmol/L (ref 135–145)
Total Bilirubin: 0.5 mg/dL (ref 0.3–1.2)
Total Protein: 7.7 g/dL (ref 6.5–8.1)

## 2020-09-02 LAB — CBC WITH DIFFERENTIAL/PLATELET
Abs Immature Granulocytes: 0.03 10*3/uL (ref 0.00–0.07)
Basophils Absolute: 0 10*3/uL (ref 0.0–0.1)
Basophils Relative: 0 %
Eosinophils Absolute: 0.3 10*3/uL (ref 0.0–0.5)
Eosinophils Relative: 3 %
HCT: 36.3 % (ref 36.0–46.0)
Hemoglobin: 11.6 g/dL — ABNORMAL LOW (ref 12.0–15.0)
Immature Granulocytes: 0 %
Lymphocytes Relative: 40 %
Lymphs Abs: 4.2 10*3/uL — ABNORMAL HIGH (ref 0.7–4.0)
MCH: 31.4 pg (ref 26.0–34.0)
MCHC: 32 g/dL (ref 30.0–36.0)
MCV: 98.1 fL (ref 80.0–100.0)
Monocytes Absolute: 0.7 10*3/uL (ref 0.1–1.0)
Monocytes Relative: 7 %
Neutro Abs: 5.1 10*3/uL (ref 1.7–7.7)
Neutrophils Relative %: 50 %
Platelets: 269 10*3/uL (ref 150–400)
RBC: 3.7 MIL/uL — ABNORMAL LOW (ref 3.87–5.11)
RDW: 13.7 % (ref 11.5–15.5)
WBC: 10.4 10*3/uL (ref 4.0–10.5)
nRBC: 0 % (ref 0.0–0.2)

## 2020-09-03 LAB — KAPPA/LAMBDA LIGHT CHAINS
Kappa free light chain: 52.4 mg/L — ABNORMAL HIGH (ref 3.3–19.4)
Kappa, lambda light chain ratio: 2.74 — ABNORMAL HIGH (ref 0.26–1.65)
Lambda free light chains: 19.1 mg/L (ref 5.7–26.3)

## 2020-09-04 LAB — MULTIPLE MYELOMA PANEL, SERUM
Albumin SerPl Elph-Mcnc: 3.6 g/dL (ref 2.9–4.4)
Albumin/Glob SerPl: 1.1 (ref 0.7–1.7)
Alpha 1: 0.2 g/dL (ref 0.0–0.4)
Alpha2 Glob SerPl Elph-Mcnc: 0.6 g/dL (ref 0.4–1.0)
B-Globulin SerPl Elph-Mcnc: 1.2 g/dL (ref 0.7–1.3)
Gamma Glob SerPl Elph-Mcnc: 1.5 g/dL (ref 0.4–1.8)
Globulin, Total: 3.5 g/dL (ref 2.2–3.9)
IgA: 396 mg/dL — ABNORMAL HIGH (ref 87–352)
IgG (Immunoglobin G), Serum: 1527 mg/dL (ref 586–1602)
IgM (Immunoglobulin M), Srm: 77 mg/dL (ref 26–217)
M Protein SerPl Elph-Mcnc: 0.2 g/dL — ABNORMAL HIGH
Total Protein ELP: 7.1 g/dL (ref 6.0–8.5)

## 2020-09-09 ENCOUNTER — Other Ambulatory Visit: Payer: Self-pay

## 2020-09-09 ENCOUNTER — Inpatient Hospital Stay (HOSPITAL_BASED_OUTPATIENT_CLINIC_OR_DEPARTMENT_OTHER): Payer: Self-pay | Admitting: Hematology and Oncology

## 2020-09-09 ENCOUNTER — Encounter: Payer: Self-pay | Admitting: Hematology and Oncology

## 2020-09-09 VITALS — BP 166/99 | HR 73 | Temp 97.7°F | Resp 18 | Ht 68.0 in | Wt 364.0 lb

## 2020-09-09 DIAGNOSIS — I10 Essential (primary) hypertension: Secondary | ICD-10-CM

## 2020-09-09 DIAGNOSIS — Z299 Encounter for prophylactic measures, unspecified: Secondary | ICD-10-CM

## 2020-09-09 DIAGNOSIS — N183 Chronic kidney disease, stage 3 unspecified: Secondary | ICD-10-CM

## 2020-09-09 DIAGNOSIS — Z23 Encounter for immunization: Secondary | ICD-10-CM

## 2020-09-09 DIAGNOSIS — D472 Monoclonal gammopathy: Secondary | ICD-10-CM

## 2020-09-09 MED ORDER — INFLUENZA VAC SPLIT QUAD 0.5 ML IM SUSY
0.5000 mL | PREFILLED_SYRINGE | Freq: Once | INTRAMUSCULAR | Status: AC
  Administered 2020-09-09: 0.5 mL via INTRAMUSCULAR

## 2020-09-09 NOTE — Assessment & Plan Note (Signed)
She has multiple risk factors for chronic kidney disease She has stopped taking all her medications due to out of insurance She is established to see her primary care doctor soon for medication refill We discussed the importance of her taking her medications

## 2020-09-09 NOTE — Assessment & Plan Note (Signed)
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 I discussed with her the natural history of MGUS. Recent blood work showed no significant signs of disease progression She would need close follow-up once a year with history, blood work and examination. She agreed 

## 2020-09-09 NOTE — Assessment & Plan Note (Signed)
We discussed the importance of preventive care and reviewed the vaccination programs. She does not have any prior allergic reactions to influenza vaccination. She agrees to proceed with influenza vaccination today and we will administer it today at the clinic.  

## 2020-09-09 NOTE — Progress Notes (Signed)
[facility] OFFICE PROGRESS NOTE  Patient Care Team: Binnie Rail, MD as PCP - General (Internal Medicine) Hennie Duos, MD as Consulting Physician (Rheumatology)  ASSESSMENT & PLAN:  MGUS (monoclonal gammopathy of unknown significance) Her blood work is most consistent with MGUS. I discussed with her the natural history of MGUS. Recent blood work showed no significant signs of disease progression She would need close follow-up once a year with history, blood work and examination. She agreed  CKD (chronic kidney disease), stage III ([name]) She has multiple risk factors for chronic kidney disease She has stopped taking all her medications due to out of insurance She is established to see her primary care doctor soon for medication refill We discussed the importance of her taking her medications  HTN (hypertension), benign Her blood pressure is elevated She has stopped taking all her medications since February of this year She is established to see somebody soon to resume medical care  Preventive measure We discussed the importance of preventive care and reviewed the vaccination programs. She does not have any prior allergic reactions to influenza vaccination. She agrees to proceed with influenza vaccination today and we will administer it today at the clinic.    Orders Placed This Encounter  Procedures  . CBC with Differential/Platelet    Standing Status:   Standing    Number of Occurrences:   22    Standing Expiration Date:   09/09/2021  . Comprehensive metabolic panel    Standing Status:   Standing    Number of Occurrences:   33    Standing Expiration Date:   09/09/2021  . Kappa/lambda light chains    Standing Status:   Standing    Number of Occurrences:   22    Standing Expiration Date:   09/09/2021  . Multiple Myeloma Panel (SPEP&IFE w/QIG)    Standing Status:   Standing    Number of Occurrences:   22    Standing Expiration Date:   09/09/2021     All questions were answered. The patient knows to call the clinic with any problems, questions or concerns. The total time spent in the appointment was 20 minutes encounter with patients including review of chart and various tests results, discussions about plan of care and coordination of care plan   Heath Lark, MD 09/09/2020 11:57 AM  INTERVAL HISTORY: Please see below for problem oriented charting. She returns for MGUS follow-up When I reviewed her medication list, the patient informed me she has not been taking any of her medications due to loss of insurance and inability to pay for her medications She has been off all her medicines since February of this year She has an appointment scheduled to see somebody to reestablish care She denies recent joint pain No recent infection, fever or chills She is due for influenza vaccination and would like to get a dose today  SUMMARY OF ONCOLOGIC HISTORY:  Alexandra Powers is here because of chronic joint pain and abnormal protein electrophoresis. The patient had problems with chronic back pain. She denies prior history of trauma. The pain has been present for over a year. She was prescribed intermittent doses of NSAID and Vicodin for this. The pains is located in the shoulder, the back, and the knees with associated morning stiffness. The patient had cholecystectomy several months ago and since then had postprandial nausea, vomiting usually within 30 minutes and associated diarrhea 4-5 times a day with abdominal cramps. She have lost almost 40  pounds over the past few months. She was subsequently referred to rheumatologist. On 05/20/2016, blood work show high sedimentation rate of 67, C-reactive protein of 11.9, abnormal serum protein electrophoresis with detectable M spike but negative autoimmune screen with ANA. Hepatitis screen was negative. She denies history of abnormal bone fracture. Patient denies history of recurrent infection or  atypical infections such as shingles of meningitis. Denies chills, night sweats or anorexia  In August 2017, she underwent extensive workup which excluded multiple myeloma. X-ray is most consistent with osteoarthritis and she have IgG lambda MGUS with polyclonal IgA   REVIEW OF SYSTEMS:   Constitutional: Denies fevers, chills or abnormal weight loss Eyes: Denies blurriness of vision Ears, nose, mouth, throat, and face: Denies mucositis or sore throat Respiratory: Denies cough, dyspnea or wheezes Cardiovascular: Denies palpitation, chest discomfort or lower extremity swelling Gastrointestinal:  Denies nausea, heartburn or change in bowel habits Skin: Denies abnormal skin rashes Lymphatics: Denies new lymphadenopathy or easy bruising Neurological:Denies numbness, tingling or new weaknesses Behavioral/Psych: Mood is stable, no new changes  All other systems were reviewed with the patient and are negative.  I have reviewed the past medical history, past surgical history, social history and family history with the patient and they are unchanged from previous note.  ALLERGIES:  has No Known Allergies.  MEDICATIONS:  Current Outpatient Medications  Medication Sig Dispense Refill  . amLODipine (NORVASC) 10 MG tablet Take 1 tablet (10 mg total) by mouth daily. Needs office visit for more refills 90 tablet 1  . cyclobenzaprine (FLEXERIL) 10 MG tablet Take 1 tablet (10 mg total) by mouth 2 (two) times daily as needed for muscle spasms. 60 tablet 5  . DULoxetine (CYMBALTA) 30 MG capsule Take 3 capsules (90 mg total) by mouth at bedtime. X 1 week then 60 mg Nightly- for nerve/back pain 90 capsule 5  . gabapentin (NEURONTIN) 300 MG capsule Take 1 capsule (300 mg total) by mouth at bedtime. 90 capsule 3  . hydrochlorothiazide (HYDRODIURIL) 25 MG tablet Take 1 tablet (25 mg total) by mouth daily. Needs office visit for more refills 90 tablet 1  . lisinopril (ZESTRIL) 40 MG tablet Take 1 tablet (40 mg  total) by mouth daily. Needs OV for more refills 90 tablet 1  . medroxyPROGESTERone (PROVERA) 5 MG tablet Take one tablet po qd x 5 days every other month until you go 6 months without bleeding. Start on August 1st. 15 tablet 1  . naproxen sodium (ANAPROX DS) 550 MG tablet Take 1 tablet (550 mg total) by mouth 2 (two) times daily with a meal. Take prn pain 30 tablet 1  . tizanidine (ZANAFLEX) 6 MG capsule Take 1 capsule (6 mg total) by mouth 3 (three) times daily as needed for muscle spasms. 90 capsule 5   No current facility-administered medications for this visit.    PHYSICAL EXAMINATION: ECOG PERFORMANCE STATUS: 1 - Symptomatic but completely ambulatory  Vitals:   09/09/20 1131  BP: (!) 166/99  Pulse: 73  Resp: 18  Temp: 97.7 F (36.5 C)  SpO2: 100%   Filed Weights   09/09/20 1131  Weight: (!) 364 lb (165.1 kg)    GENERAL:alert, no distress and comfortable NEURO: alert & oriented x 3 with fluent speech, no focal motor/sensory deficits  LABORATORY DATA:  I have reviewed the data as listed    Component Value Date/Time   NA 141 09/02/2020 1051   NA 138 03/17/2018 1138   NA 142 08/05/2017 1057   K 3.9  09/02/2020 1051   K 2.7 (LL) 08/05/2017 1057   CL 108 09/02/2020 1051   CO2 26 09/02/2020 1051   CO2 33 (H) 08/05/2017 1057   GLUCOSE 88 09/02/2020 1051   GLUCOSE 103 08/05/2017 1057   BUN 12 09/02/2020 1051   BUN 12 03/17/2018 1138   BUN 31.2 (H) 08/05/2017 1057   CREATININE 1.20 (H) 09/02/2020 1051   CREATININE 1.11 (H) 07/06/2019 1010   CREATININE 1.8 (H) 08/05/2017 1057   CALCIUM 9.4 09/02/2020 1051   CALCIUM 10.4 08/05/2017 1057   PROT 7.7 09/02/2020 1051   PROT 7.3 03/17/2018 1138   PROT 8.3 08/05/2017 1057   ALBUMIN 3.3 (L) 09/02/2020 1051   ALBUMIN 3.9 03/17/2018 1138   ALBUMIN 3.2 (L) 08/05/2017 1057   AST 19 09/02/2020 1051   AST 18 08/05/2017 1057   ALT 14 09/02/2020 1051   ALT 10 08/05/2017 1057   ALKPHOS 77 09/02/2020 1051   ALKPHOS 62 08/05/2017  1057   BILITOT 0.5 09/02/2020 1051   BILITOT 0.4 03/17/2018 1138   BILITOT 0.46 08/05/2017 1057   GFRNONAA 53 (L) 09/02/2020 1051   GFRNONAA 58 (L) 07/06/2019 1010   GFRAA >60 09/02/2020 1051   GFRAA 68 07/06/2019 1010    No results found for: SPEP, UPEP  Lab Results  Component Value Date   WBC 10.4 09/02/2020   NEUTROABS 5.1 09/02/2020   HGB 11.6 (L) 09/02/2020   HCT 36.3 09/02/2020   MCV 98.1 09/02/2020   PLT 269 09/02/2020      Chemistry      Component Value Date/Time   NA 141 09/02/2020 1051   NA 138 03/17/2018 1138   NA 142 08/05/2017 1057   K 3.9 09/02/2020 1051   K 2.7 (LL) 08/05/2017 1057   CL 108 09/02/2020 1051   CO2 26 09/02/2020 1051   CO2 33 (H) 08/05/2017 1057   BUN 12 09/02/2020 1051   BUN 12 03/17/2018 1138   BUN 31.2 (H) 08/05/2017 1057   CREATININE 1.20 (H) 09/02/2020 1051   CREATININE 1.11 (H) 07/06/2019 1010   CREATININE 1.8 (H) 08/05/2017 1057      Component Value Date/Time   CALCIUM 9.4 09/02/2020 1051   CALCIUM 10.4 08/05/2017 1057   ALKPHOS 77 09/02/2020 1051   ALKPHOS 62 08/05/2017 1057   AST 19 09/02/2020 1051   AST 18 08/05/2017 1057   ALT 14 09/02/2020 1051   ALT 10 08/05/2017 1057   BILITOT 0.5 09/02/2020 1051   BILITOT 0.4 03/17/2018 1138   BILITOT 0.46 08/05/2017 1057

## 2020-09-09 NOTE — Assessment & Plan Note (Signed)
Her blood pressure is elevated She has stopped taking all her medications since February of this year She is established to see somebody soon to resume medical care

## 2020-09-10 ENCOUNTER — Ambulatory Visit (INDEPENDENT_AMBULATORY_CARE_PROVIDER_SITE_OTHER): Payer: Self-pay | Admitting: Family Medicine

## 2020-09-10 ENCOUNTER — Encounter: Payer: Self-pay | Admitting: Family Medicine

## 2020-09-10 ENCOUNTER — Other Ambulatory Visit: Payer: Self-pay | Admitting: Family Medicine

## 2020-09-10 VITALS — BP 156/95 | HR 75 | Temp 97.8°F | Resp 17 | Ht 68.0 in | Wt 363.8 lb

## 2020-09-10 DIAGNOSIS — Z131 Encounter for screening for diabetes mellitus: Secondary | ICD-10-CM

## 2020-09-10 DIAGNOSIS — I10 Essential (primary) hypertension: Secondary | ICD-10-CM

## 2020-09-10 DIAGNOSIS — G894 Chronic pain syndrome: Secondary | ICD-10-CM

## 2020-09-10 DIAGNOSIS — M2550 Pain in unspecified joint: Secondary | ICD-10-CM

## 2020-09-10 DIAGNOSIS — D472 Monoclonal gammopathy: Secondary | ICD-10-CM

## 2020-09-10 DIAGNOSIS — G8929 Other chronic pain: Secondary | ICD-10-CM

## 2020-09-10 DIAGNOSIS — Z09 Encounter for follow-up examination after completed treatment for conditions other than malignant neoplasm: Secondary | ICD-10-CM

## 2020-09-10 DIAGNOSIS — Z7689 Persons encountering health services in other specified circumstances: Secondary | ICD-10-CM

## 2020-09-10 MED ORDER — AMLODIPINE BESYLATE 10 MG PO TABS: 10.0000 mg | ORAL_TABLET | Freq: Every day | ORAL | 3 refills | Status: DC

## 2020-09-10 MED ORDER — DULOXETINE HCL 60 MG PO CPEP: 60.0000 mg | ORAL_CAPSULE | Freq: Every day | ORAL | 3 refills | Status: DC

## 2020-09-10 MED ORDER — HYDROCHLOROTHIAZIDE 25 MG PO TABS: 25.0000 mg | ORAL_TABLET | Freq: Every day | ORAL | 3 refills | Status: DC

## 2020-09-10 MED ORDER — TIZANIDINE HCL 6 MG PO CAPS: 6.0000 mg | ORAL_CAPSULE | Freq: Three times a day (TID) | ORAL | 3 refills | Status: DC | PRN

## 2020-09-10 MED ORDER — GABAPENTIN 300 MG PO CAPS: 300.0000 mg | ORAL_CAPSULE | Freq: Every day | ORAL | 3 refills | Status: DC

## 2020-09-10 MED ORDER — LISINOPRIL 40 MG PO TABS: 40.0000 mg | ORAL_TABLET | Freq: Every day | ORAL | 1 refills | Status: DC

## 2020-09-10 MED ORDER — KETOROLAC TROMETHAMINE 60 MG/2ML IM SOLN
60.0000 mg | Freq: Once | INTRAMUSCULAR | Status: AC
  Administered 2020-09-10: 60 mg via INTRAMUSCULAR

## 2020-09-10 MED FILL — DULoxetine HCL 60 MG CPEP: 60 | 30 days supply | Qty: 30 | Fill #0

## 2020-09-10 MED FILL — LISINOPRIL 40 MG TABLET: 40 | 30 days supply | Qty: 30 | Fill #0

## 2020-09-10 MED FILL — AMLODIPINE BESYLATE 10 MG T: 10 | 30 days supply | Qty: 30 | Fill #0

## 2020-09-10 MED FILL — GABAPENTIN 300 MG CAPSULE: 300 | 30 days supply | Qty: 30 | Fill #0

## 2020-09-10 MED FILL — tiZANidine HCL 6 MG CAPS: 6 | 30 days supply | Qty: 90 | Fill #0

## 2020-09-10 MED FILL — HYDROCHLOROTHIAZIDE 25 MG T: 25 | 30 days supply | Qty: 30 | Fill #0

## 2020-09-10 NOTE — Progress Notes (Signed)
Patient [facility]  New Patient--Establish Care  Subjective:  [name], female    DOB: Aug 07, 1971  Age: 49 y.o. MRN: [id number]  CC:  Chief Complaint  Patient presents with  . Establish Care    Pt states she is concerned about her BP, and pt wants to start her team of doctor because she lost them because of no INS. X83yr  . Back Pain    X3 yrs   . Knee Pain    Pt states in constant pain.X3 yrs  . Hand Pain    Pt states  in the morning is when she is in the most pain.[id number]    HPI [name] a [age]ear old female who presents to Establish Care.   Patient Active Problem List   Diagnosis Date Noted  . CKD (chronic kidney disease), stage III ([facility]10/09/2020  . Preventive measure 09/09/2020  . CKD (chronic kidney disease), stage II 09/08/2019  . Trochanteric bursitis of left hip 09/08/2019  . Anxiety and depression 09/01/2019  . Chronic pain syndrome 08/08/2019  . Chronic myofascial pain 08/08/2019  . Bilateral breast cysts 08/01/2019  . Cellulitis of left breast 07/14/2019  . 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 Primary osteoarthritis of both hips 07/29/2018  . Primary osteoarthritis of both knees 07/29/2018  . Primary osteoarthritis of both feet 07/29/2018  . Tonsillitis 05/02/2018  . Acute prerenal failure ([facility]10/03/2017  . OSA (obstructive sleep apnea) 12/10/2016  . GERD (gastroesophageal reflux disease) 12/04/2016  . Low back pain at multiple sites 08/21/2016  . Chronic pain of multiple joints 08/13/2016  . MGUS (monoclonal gammopathy of unknown significance) 07/02/2016  . Vitamin D deficiency 07/02/2016  . HTN (hypertension), benign 11/11/2015  . Morbid obesity ([facility]12/10/2015  . Adrenal mass (HC[facility]09/30/2016  . Ovarian cyst 08/30/2015    Current Status: This will be [name]'s initial office visit with me. She was [facility]. She was previously followed by Pain Management for her chronic pain issues prior to losing her medical insurance. [facility]. Since her last office visit, he is doing well with no complaints. He denies fevers, chills, fatigue, recent infections, weight loss, and night sweats. He has not had any headaches, visual changes, dizziness, and falls. No chest pain, heart palpitations, cough and shortness of breath reported. No reports of GI problems such as nausea, vomiting, diarrhea, and constipation. He has no reports of blood in stools, dysuria and hematuria. No depression or anxiety, and denies suicidal ideations, homicidal ideations, or auditory hallucinations. He is taking all medications as prescribed. He denies pain today.   Past Medical History:  Diagnosis Date  . Arthritis   . Gallstones   . GERD (gastroesophageal reflux disease)   . Hypertension   . MGUS (monoclonal gammopathy of unknown significance)   . Obesity   . Osteoarthritis   . PONV (postoperative nausea and vomiting)   . Rheumatoid arthritis ([facility]  . Sleep apnea    SENT CPAP BACK, NOT WORN IN 7-8 MONTHS    Past Surgical History:  Procedure Laterality Date  . BREAST DUCTAL SYSTEM EXCISION Bilateral 10/12/2017   Procedure: EXCISION OF BILATERAL CENTRAL DUCT;  Surgeon: CoErroll LunaMD;  [facility] Service: General;  Laterality: Bilateral;  . BREAST  EXCISIONAL BIOPSY    . CHOLECYSTECTOMY N/A 12/21/2015   Procedure: LAPAROSCOPIC CHOLECYSTECTOMY;  Surgeon: Georganna Skeans, MD;  Location: [facility];  Service: General;  Laterality: N/A;  . UMBILICAL HERNIA REPAIR N/A 12/27/2015   Procedure: INCARCERATED UMBILICAL HERNIA REPAIR;  Surgeon: Coralie Keens, MD;  Location[facility];  Service: General;  Laterality: N/A;    Family History  Problem Relation Age of Onset  . Arthritis Mother   . High blood pressure Mother   . Sleep apnea Mother    . High blood pressure Father   . Breast cancer Paternal Aunt   . Arthritis Sister   . Healthy Daughter   . Healthy Son   . Adrenal disorder Neg Hx   . Colon cancer Neg Hx   . Esophageal cancer Neg Hx   . Pancreatic cancer Neg Hx   . Stomach cancer Neg Hx   . Liver disease Neg Hx     Social History   Socioeconomic History  . Marital status: Married    Spouse name: Perpetua Elling  . Number of children: 2  . Years of education: Not on file  . Highest education level: High school graduate  Occupational History  . Occupation: [employer]: unemploed  Tobacco Use  . Smoking status: Current Every Day Smoker    Packs/day: 0.25    Years: 24.00    Pack years: 6.00    Types: Cigarettes  . Smokeless tobacco: Never Used  Vaping Use  . Vaping Use: Never used  Substance and Sexual Activity  . Alcohol use: Yes    Alcohol/week: 2.0 standard drinks    Types: 2 Shots of liquor per week    Comment: per week  . Drug use: Yes    Types: Marijuana  . Sexual activity: Yes    Partners: Male    Birth control/protection: None  Other Topics Concern  . Not on file  Social History Narrative   Lives with husband, Berneta Sages.   Social Determinants of Health   Financial Resource Strain:   . Difficulty of Paying Living Expenses: Not on file  Food Insecurity:   . Worried About Charity fundraiser in the Last Year: Not on file  . Ran Out of Food in the Last Year: Not on file  Transportation Needs: No Transportation Needs  . Lack of Transportation (Medical): No  . Lack of Transportation (Non-Medical): No  Physical Activity:   . Days of Exercise per Week: Not on file  . Minutes of Exercise per Session: Not on file  Stress:   . Feeling of Stress : Not on file  Social Connections:   . Frequency of Communication with Friends and Family: Not on file  . Frequency of Social Gatherings with Friends and Family: Not on file  . Attends Religious Services: Not on file  . Active Member of  Clubs or Organizations: Not on file  . Attends Archivist Meetings: Not on file  . Marital Status: Not on file  Intimate Partner Violence:   . Fear of Current or Ex-Partner: Not on file  . Emotionally Abused: Not on file  . Physically Abused: Not on file  . Sexually Abused: Not on file    Outpatient Medications Prior to Visit  Medication Sig Dispense Refill  . medroxyPROGESTERone (PROVERA) 5 MG tablet Take one tablet po qd x 5 days every other month until you go 6 months without bleeding. Start on August 1st. (Patient not taking: Reported  on 09/10/2020) 15 tablet 1  . naproxen sodium (ANAPROX DS) 550 MG tablet Take 1 tablet (550 mg total) by mouth 2 (two) times daily with a meal. Take prn pain (Patient not taking: Reported on 09/10/2020) 30 tablet 1  . amLODipine (NORVASC) 10 MG tablet Take 1 tablet (10 mg total) by mouth daily. Needs office visit for more refills (Patient not taking: Reported on 09/10/2020) 90 tablet 1  . cyclobenzaprine (FLEXERIL) 10 MG tablet Take 1 tablet (10 mg total) by mouth 2 (two) times daily as needed for muscle spasms. (Patient not taking: Reported on 09/10/2020) 60 tablet 5  . DULoxetine (CYMBALTA) 30 MG capsule Take 3 capsules (90 mg total) by mouth at bedtime. X 1 week then 60 mg Nightly- for nerve/back pain (Patient not taking: Reported on 09/10/2020) 90 capsule 5  . gabapentin (NEURONTIN) 300 MG capsule Take 1 capsule (300 mg total) by mouth at bedtime. (Patient not taking: Reported on 09/10/2020) 90 capsule 3  . hydrochlorothiazide (HYDRODIURIL) 25 MG tablet Take 1 tablet (25 mg total) by mouth daily. Needs office visit for more refills (Patient not taking: Reported on 09/10/2020) 90 tablet 1  . lisinopril (ZESTRIL) 40 MG tablet Take 1 tablet (40 mg total) by mouth daily. Needs OV for more refills (Patient not taking: Reported on 09/10/2020) 90 tablet 1  . tizanidine (ZANAFLEX) 6 MG capsule Take 1 capsule (6 mg total) by mouth 3 (three) times daily as  needed for muscle spasms. (Patient not taking: Reported on 09/10/2020) 90 capsule 5   No facility-administered medications prior to visit.    No Known Allergies  ROS Review of Systems  Constitutional: Negative.   HENT: Negative.   Eyes: Negative.   Respiratory: Negative.   Cardiovascular: Negative.   Gastrointestinal: Negative.   Genitourinary: Negative.   Musculoskeletal: Negative.   Skin: Negative.   Allergic/Immunologic: Negative.   Neurological: Negative.   Hematological: Negative.   Psychiatric/Behavioral: Negative.       Objective:    Physical Exam Vitals and nursing note reviewed.  Constitutional:      Appearance: Normal appearance. She is obese.  HENT:     Head: Normocephalic and atraumatic.     Nose: Nose normal.     Mouth/Throat:     Mouth: Mucous membranes are moist.     Pharynx: Oropharynx is clear.  Cardiovascular:     Rate and Rhythm: Normal rate and regular rhythm.     Pulses: Normal pulses.     Heart sounds: Normal heart sounds.  Pulmonary:     Effort: Pulmonary effort is normal.     Breath sounds: Normal breath sounds.  Abdominal:     General: Bowel sounds are normal. There is distension (obese).     Palpations: Abdomen is soft.  Musculoskeletal:        General: Normal range of motion.     Cervical back: Normal range of motion and neck supple.  Skin:    General: Skin is warm and dry.  Neurological:     General: No focal deficit present.     Mental Status: She is alert and oriented to person, place, and time.  Psychiatric:        Mood and Affect: Mood normal.        Behavior: Behavior normal.        Thought Content: Thought content normal.        Judgment: Judgment normal.     BP (!) 156/95 (BP Location: Right Arm, Patient Position: Sitting, Cuff  Size: Large)   Pulse 75   Temp 97.8 F (36.6 C)   Resp 17   Ht '5\' 8"'  (1.727 m)   Wt (!) 363 lb 12.8 oz (165 kg)   SpO2 100%   BMI 55.32 kg/m  Wt Readings from Last 3 Encounters:   09/10/20 (!) 363 lb 12.8 oz (165 kg)  09/09/20 (!) 364 lb (165.1 kg)  07/18/20 (!) 348 lb 12.8 oz (158.2 kg)     Health Maintenance Due  Topic Date Due  . COVID-19 Vaccine (1) Never done  . COLONOSCOPY  Never done    There are no preventive care reminders to display for this patient.  Lab Results  Component Value Date   TSH 0.992 09/10/2020   Lab Results  Component Value Date   WBC 10.4 09/02/2020   HGB 11.6 (L) 09/02/2020   HCT 36.3 09/02/2020   MCV 98.1 09/02/2020   PLT 269 09/02/2020   Lab Results  Component Value Date   NA 141 09/02/2020   K 3.9 09/02/2020   CHLORIDE 97 (L) 08/05/2017   CO2 26 09/02/2020   GLUCOSE 88 09/02/2020   BUN 12 09/02/2020   CREATININE 1.20 (H) 09/02/2020   BILITOT 0.5 09/02/2020   ALKPHOS 77 09/02/2020   AST 19 09/02/2020   ALT 14 09/02/2020   PROT 7.7 09/02/2020   ALBUMIN 3.3 (L) 09/02/2020   CALCIUM 9.4 09/02/2020   ANIONGAP 7 09/02/2020   EGFR 38 (L) 08/05/2017   GFR 68.88 06/24/2018   Lab Results  Component Value Date   CHOL 193 09/10/2020   Lab Results  Component Value Date   HDL 57 09/10/2020   Lab Results  Component Value Date   LDLCALC 124 (H) 09/10/2020   Lab Results  Component Value Date   TRIG 66 09/10/2020   Lab Results  Component Value Date   CHOLHDL 3.4 09/10/2020   Lab Results  Component Value Date   HGBA1C 5.3 09/11/2020    Assessment & Plan:   1. Hospital discharge follow-up  2. Encounter to establish care  3. Chronic pain syndrome - DULoxetine (CYMBALTA) 60 MG capsule; Take 1 capsule (60 mg total) by mouth daily.  Dispense: 90 capsule; Refill: 3 - gabapentin (NEURONTIN) 300 MG capsule; Take 1 capsule (300 mg total) by mouth at bedtime.  Dispense: 90 capsule; Refill: 3 - tizanidine (ZANAFLEX) 6 MG capsule; Take 1 capsule (6 mg total) by mouth 3 (three) times daily as needed for muscle spasms.  Dispense: 270 capsule; Refill: 3 - ketorolac (TORADOL) injection 60 mg  4. Chronic pain of  multiple joints - DULoxetine (CYMBALTA) 60 MG capsule; Take 1 capsule (60 mg total) by mouth daily.  Dispense: 90 capsule; Refill: 3 - gabapentin (NEURONTIN) 300 MG capsule; Take 1 capsule (300 mg total) by mouth at bedtime.  Dispense: 90 capsule; Refill: 3 - tizanidine (ZANAFLEX) 6 MG capsule; Take 1 capsule (6 mg total) by mouth 3 (three) times daily as needed for muscle spasms.  Dispense: 270 capsule; Refill: 3 - ketorolac (TORADOL) injection 60 mg  5. HTN (hypertension), benign She will continue to take medications as prescribed, to decrease high sodium intake, excessive alcohol intake, increase potassium intake, smoking cessation, and increase physical activity of at least 30 minutes of cardio activity daily. She will continue to follow Heart Healthy or DASH diet. - amLODipine (NORVASC) 10 MG tablet; Take 1 tablet (10 mg total) by mouth daily. Needs office visit for more refills  Dispense: 90 tablet; Refill: 3 -  hydrochlorothiazide (HYDRODIURIL) 25 MG tablet; Take 1 tablet (25 mg total) by mouth daily.  Dispense: 90 tablet; Refill: 3 - lisinopril (ZESTRIL) 40 MG tablet; Take 1 tablet (40 mg total) by mouth daily.  Dispense: 90 tablet; Refill: 1 - Lipid Panel - TSH - Vitamin B12 - Vitamin D, 25-hydroxy  6. MGUS (monoclonal gammopathy of unknown significance) Remission   7. Diabetes mellitus screening - Hemoglobin A1c  8. Follow up She will follow up 1 month.   Meds ordered this encounter  Medications  . amLODipine (NORVASC) 10 MG tablet    Sig: Take 1 tablet (10 mg total) by mouth daily. Needs office visit for more refills    Dispense:  90 tablet    Refill:  3  . DULoxetine (CYMBALTA) 60 MG capsule    Sig: Take 1 capsule (60 mg total) by mouth daily.    Dispense:  90 capsule    Refill:  3  . gabapentin (NEURONTIN) 300 MG capsule    Sig: Take 1 capsule (300 mg total) by mouth at bedtime.    Dispense:  90 capsule    Refill:  3  . hydrochlorothiazide (HYDRODIURIL) 25 MG tablet     Sig: Take 1 tablet (25 mg total) by mouth daily.    Dispense:  90 tablet    Refill:  3  . lisinopril (ZESTRIL) 40 MG tablet    Sig: Take 1 tablet (40 mg total) by mouth daily.    Dispense:  90 tablet    Refill:  1  . tizanidine (ZANAFLEX) 6 MG capsule    Sig: Take 1 capsule (6 mg total) by mouth 3 (three) times daily as needed for muscle spasms.    Dispense:  270 capsule    Refill:  3  . ketorolac (TORADOL) injection 60 mg    Orders Placed This Encounter  Procedures  . Lipid Panel  . TSH  . Vitamin B12  . Vitamin D, 25-hydroxy  . Hemoglobin A1c    Referral Orders  No referral(s) requested today    Kathe Becton,  MSN, FNP-BC [address] [place], [place] [address] [phone] fax   Problem List Items Addressed This Visit      Cardiovascular and Mediastinum   HTN (hypertension), benign   Relevant Medications   amLODipine (NORVASC) 10 MG tablet   hydrochlorothiazide (HYDRODIURIL) 25 MG tablet   lisinopril (ZESTRIL) 40 MG tablet   Other Relevant Orders   Lipid Panel (Completed)   TSH (Completed)   Vitamin B12 (Completed)   Vitamin D, 25-hydroxy (Completed)     Other   Chronic pain of multiple joints   Relevant Medications   DULoxetine (CYMBALTA) 60 MG capsule   gabapentin (NEURONTIN) 300 MG capsule   tizanidine (ZANAFLEX) 6 MG capsule   Chronic pain syndrome   Relevant Medications   DULoxetine (CYMBALTA) 60 MG capsule   gabapentin (NEURONTIN) 300 MG capsule   tizanidine (ZANAFLEX) 6 MG capsule   MGUS (monoclonal gammopathy of unknown significance)    Other Visit Diagnoses    Hospital discharge follow-up    -  Primary   Encounter to establish care       Diabetes mellitus screening       Relevant Orders   Hemoglobin A1c (Completed)   Follow up          Meds ordered this encounter  Medications  . amLODipine (NORVASC) 10 MG tablet  Sig: Take 1 tablet (10 mg total) by mouth daily. Needs office visit for more refills    Dispense:  90 tablet    Refill:  3  . DULoxetine (CYMBALTA) 60 MG capsule    Sig: Take 1 capsule (60 mg total) by mouth daily.    Dispense:  90 capsule    Refill:  3  . gabapentin (NEURONTIN) 300 MG capsule    Sig: Take 1 capsule (300 mg total) by mouth at bedtime.    Dispense:  90 capsule    Refill:  3  . hydrochlorothiazide (HYDRODIURIL) 25 MG tablet    Sig: Take 1 tablet (25 mg total) by mouth daily.    Dispense:  90 tablet    Refill:  3  . lisinopril (ZESTRIL) 40 MG tablet    Sig: Take 1 tablet (40 mg total) by mouth daily.    Dispense:  90 tablet    Refill:  1  . tizanidine (ZANAFLEX) 6 MG capsule    Sig: Take 1 capsule (6 mg total) by mouth 3 (three) times daily as needed for muscle spasms.    Dispense:  270 capsule    Refill:  3  . ketorolac (TORADOL) injection 60 mg    Follow-up: Return in about 3 months (around 12/11/2020).    Azzie Glatter, FNP

## 2020-09-11 ENCOUNTER — Other Ambulatory Visit: Payer: Self-pay

## 2020-09-11 ENCOUNTER — Telehealth: Payer: Self-pay | Admitting: Family Medicine

## 2020-09-11 LAB — VITAMIN B12: Vitamin B-12: 344 pg/mL (ref 232–1245)

## 2020-09-11 LAB — LIPID PANEL
Chol/HDL Ratio: 3.4 ratio (ref 0.0–4.4)
Cholesterol, Total: 193 mg/dL (ref 100–199)
HDL: 57 mg/dL (ref >39)
LDL Chol Calc (NIH): 124 mg/dL — ABNORMAL HIGH (ref 0–99)
Triglycerides: 66 mg/dL (ref 0–149)
VLDL Cholesterol Cal: 12 mg/dL (ref 5–40)

## 2020-09-11 LAB — TSH: TSH: 0.992 u[IU]/mL (ref 0.450–4.500)

## 2020-09-11 LAB — VITAMIN D 25 HYDROXY (VIT D DEFICIENCY, FRACTURES): Vit D, 25-Hydroxy: 13.9 ng/mL — ABNORMAL LOW (ref 30.0–100.0)

## 2020-09-11 NOTE — Telephone Encounter (Signed)
Error

## 2020-09-12 ENCOUNTER — Other Ambulatory Visit: Payer: Self-pay | Admitting: Family Medicine

## 2020-09-12 DIAGNOSIS — E559 Vitamin D deficiency, unspecified: Secondary | ICD-10-CM

## 2020-09-12 LAB — HEMOGLOBIN A1C
Est. average glucose Bld gHb Est-mCnc: 105 mg/dL
Hgb A1c MFr Bld: 5.3 % (ref 4.8–5.6)

## 2020-09-12 MED ORDER — VITAMIN D (ERGOCALCIFEROL) 1.25 MG (50000 UNIT) PO CAPS: 50000.0000 [IU] | ORAL_CAPSULE | ORAL | 5 refills | Status: DC

## 2020-09-15 ENCOUNTER — Encounter: Payer: Self-pay | Admitting: Family Medicine

## 2020-09-17 NOTE — Progress Notes (Signed)
Pt was called to discuss her lab results. Pt stated she understood and will keep her f/u appt. Pt was advised if she need anything before to give us a call. ° °

## 2020-12-11 ENCOUNTER — Ambulatory Visit (INDEPENDENT_AMBULATORY_CARE_PROVIDER_SITE_OTHER): Payer: No Typology Code available for payment source | Admitting: Family Medicine

## 2020-12-11 ENCOUNTER — Other Ambulatory Visit: Payer: Self-pay

## 2020-12-11 ENCOUNTER — Encounter: Payer: Self-pay | Admitting: Family Medicine

## 2020-12-11 VITALS — Ht 68.0 in | Wt 363.0 lb

## 2020-12-11 DIAGNOSIS — I10 Essential (primary) hypertension: Secondary | ICD-10-CM

## 2020-12-11 NOTE — Telephone Encounter (Signed)
Per PCP Azzie Glatter, FNP   [facility], could you please contact this patient to assess any needs, like refills, etc that she may need? I have attempted to contact her from home but she has a block on her phone. Want to make sure she is okay and doesn't need anything, we will schedule her for follow up sometimes next week. Please and thank you!    Patient was called and answered the phone. Patient states that she would like refill on Amlodipine 10 mg . Patient states that she would also like referral to doctor for both knees. Due to pain, swelling, throbbing, and popping. Preferably in [place], [place] . Patient states that she's moved to [facility] now. Patient also agreed to see provider Azzie Glatter, FNP next week. Patient scheduled for 12/18/2020 at 3:20 PM. Patient is put in as Virtual visit due to this being the only available appointment on schedule. I told patient that I would confirm with you Azzie Glatter, FNP  if you'd like to see patient face to face or keep visit as virtual. Please Advise. Medication also pended and new pharmacy listed below.  [facility]  Address: [address] [place], [place], [address] Phone: [phone]"  Message routed to Azzie Glatter, FNP

## 2020-12-12 ENCOUNTER — Other Ambulatory Visit: Payer: Self-pay | Admitting: Family Medicine

## 2020-12-12 DIAGNOSIS — M170 Bilateral primary osteoarthritis of knee: Secondary | ICD-10-CM

## 2020-12-12 MED ORDER — AMLODIPINE BESYLATE 10 MG PO TABS: 10.0000 mg | ORAL_TABLET | Freq: Every day | ORAL | 3 refills | Status: DC

## 2020-12-13 ENCOUNTER — Telehealth: Payer: Self-pay

## 2020-12-13 NOTE — Telephone Encounter (Signed)
-----   Message from Azzie Glatter, [place] sent at 12/12/2020  5:35 PM EST ----- Please inform patient that Rx refill for Amlodipine was sent to pharmacy today.   We also sent new referral today to Orthopedics in [place], [place] for chronic knee pain.   We will also need to reschedule her appointment for 12/18/2020 to to the following week for face-to-face, as I will not be in office next week.   Thank you.

## 2020-12-13 NOTE — Telephone Encounter (Signed)
Called and spoke with patient  Inform her of her this information.

## 2020-12-18 ENCOUNTER — Telehealth: Payer: No Typology Code available for payment source | Admitting: Family Medicine

## 2020-12-27 ENCOUNTER — Ambulatory Visit (INDEPENDENT_AMBULATORY_CARE_PROVIDER_SITE_OTHER): Payer: Self-pay | Admitting: Family Medicine

## 2020-12-27 ENCOUNTER — Other Ambulatory Visit: Payer: Self-pay

## 2020-12-27 VITALS — BP 150/92 | HR 92 | Temp 98.2°F | Ht 68.0 in | Wt 372.0 lb

## 2020-12-27 DIAGNOSIS — M25562 Pain in left knee: Secondary | ICD-10-CM

## 2020-12-27 DIAGNOSIS — M549 Dorsalgia, unspecified: Secondary | ICD-10-CM

## 2020-12-27 DIAGNOSIS — I10 Essential (primary) hypertension: Secondary | ICD-10-CM

## 2020-12-27 DIAGNOSIS — M25561 Pain in right knee: Secondary | ICD-10-CM

## 2020-12-27 DIAGNOSIS — G8929 Other chronic pain: Secondary | ICD-10-CM

## 2020-12-27 DIAGNOSIS — Z09 Encounter for follow-up examination after completed treatment for conditions other than malignant neoplasm: Secondary | ICD-10-CM

## 2020-12-27 MED ORDER — LISINOPRIL 40 MG PO TABS: 40.0000 mg | ORAL_TABLET | Freq: Every day | ORAL | 3 refills | Status: DC

## 2020-12-27 MED ORDER — AMLODIPINE BESYLATE 10 MG PO TABS: 10.0000 mg | ORAL_TABLET | Freq: Every day | ORAL | 3 refills | Status: DC

## 2020-12-27 MED ORDER — KETOROLAC TROMETHAMINE 60 MG/2ML IM SOLN
60.0000 mg | Freq: Once | INTRAMUSCULAR | Status: AC
  Administered 2020-12-27: 60 mg via INTRAMUSCULAR

## 2020-12-27 MED ORDER — PREDNISONE 10 MG PO TABS: ORAL_TABLET | ORAL | 0 refills | Status: DC

## 2020-12-27 MED ORDER — METHOCARBAMOL 750 MG PO TABS: 750.0000 mg | ORAL_TABLET | Freq: Three times a day (TID) | ORAL | 1 refills | Status: DC

## 2020-12-27 MED ORDER — HYDROCHLOROTHIAZIDE 25 MG PO TABS: 25.0000 mg | ORAL_TABLET | Freq: Every day | ORAL | 3 refills | Status: DC

## 2020-12-27 NOTE — Patient Instructions (Addendum)
Chronic Back Pain When back pain lasts longer than 3 months, it is called chronic back pain. Pain may get worse at certain times (flare-ups). There are things you can do at home to manage your pain. Follow these instructions at home: Pay attention to any changes in your symptoms. Take these actions to help with your pain: Managing pain and stiffness  If told, put ice on the painful area. Your doctor may tell you to use ice for 24-48 hours after the flare-up starts. To do this: ? Put ice in a plastic bag. ? Place a towel between your skin and the bag. ? Leave the ice on for 20 minutes, 2-3 times a day.  If told, put heat on the painful area. Do this as often as told by your doctor. Use the heat source that your doctor recommends, such as a moist heat pack or a heating pad. ? Place a towel between your skin and the heat source. ? Leave the heat on for 20-30 minutes. ? Take off the heat if your skin turns bright red. This is especially important if you are unable to feel pain, heat, or cold. You may have a greater risk of getting burned.  Soak in a warm bath. This can help relieve pain.      Activity  Avoid bending and other activities that make pain worse.  When standing: ? Keep your upper back and neck straight. ? Keep your shoulders pulled back. ? Avoid slouching.  When sitting: ? Keep your back straight. ? Relax your shoulders. Do not round your shoulders or pull them backward.  Do not sit or stand in one place for long periods of time.  Take short rest breaks during the day. Lying down or standing is usually better than sitting. Resting can help relieve pain.  When sitting or lying down for a long time, do some mild activity or stretching. This will help to prevent stiffness and pain.  Get regular exercise. Ask your doctor what activities are safe for you.  Do not lift anything that is heavier than 10 lb (4.5 kg) or the limit that you are told, until your doctor says that it  is safe.  To prevent injury when you lift things: ? Bend your knees. ? Keep the weight close to your body. ? Avoid twisting.  Sleep on a firm mattress. Try lying on your side with your knees slightly bent. If you lie on your back, put a pillow under your knees.   Medicines  Treatment may include medicines for pain and swelling taken by mouth or put on the skin, prescription pain medicine, or muscle relaxants.  Take over-the-counter and prescription medicines only as told by your doctor.  Ask your doctor if the medicine prescribed to you: ? Requires you to avoid driving or using machinery. ? Can cause trouble pooping (constipation). You may need to take these actions to prevent or treat trouble pooping:  Drink enough fluid to keep your pee (urine) pale yellow.  Take over-the-counter or prescription medicines.  Eat foods that are high in fiber. These include beans, whole grains, and fresh fruits and vegetables.  Limit foods that are high in fat and sugars. These include fried or sweet foods. General instructions  Do not use any products that contain nicotine or tobacco, such as cigarettes, e-cigarettes, and chewing tobacco. If you need help quitting, ask your doctor.  Keep all follow-up visits as told by your doctor. This is important. Contact a doctor if:    Your pain does not get better with rest or medicine.  Your pain gets worse, or you have new pain.  You have a high fever.  You lose weight very quickly.  You have trouble doing your normal activities. Get help right away if:  One or both of your legs or feet feel weak.  One or both of your legs or feet lose feeling (have numbness).  You have trouble controlling when you poop (have a bowel movement) or pee (urinate).  You have bad back pain and: ? You feel like you may vomit (nauseous), or you vomit. ? You have pain in your belly (abdomen). ? You have shortness of breath. ? You faint. Summary  When back pain  lasts longer than 3 months, it is called chronic back pain.  Pain may get worse at certain times (flare-ups).  Use ice and heat as told by your doctor. Your doctor may tell you to use ice after flare-ups. This information is not intended to replace advice given to you by your health care provider. Make sure you discuss any questions you have with your health care provider. Document Revised: 12/27/2019 Document Reviewed: 12/27/2019 Elsevier Patient Education  2021 [place]. Ketorolac Injection What is this medicine? KETOROLAC (kee toe ROLE ak) is a non-steroidal anti-inflammatory drug, also known as an NSAID. It treats pain, inflammation, and swelling. This medicine may be used for other purposes; ask your health care provider or pharmacist if you have questions. COMMON BRAND NAME(S): Toradol What should I tell my health care provider before I take this medicine? They need to know if you have any of these conditions:  bleeding disorder  coronary artery bypass graft (CABG) within the past 2 weeks  heart attack  heart disease  heart failure  high blood pressure  if you often drink alcohol  kidney disease  liver disease  lung or breathing disease (asthma)  receiving steroids like dexamethasone or prednisone  smoke tobacco cigarettes  stomach bleeding  stomach ulcers, other stomach or intestine problems  take medicine to treat or prevent blood clots  an unusual or allergic reaction to ketorolac, other medicines, foods, dyes, or preservatives  pregnant or trying to get pregnant  breast-feeding How should I use this medicine? This medicine is injected into a vein or muscle. It is given by a health care provider in a hospital or clinic setting. A special MedGuide will be given to you before each treatment. Be sure to read this information carefully each time. Talk to your health care provider about the use of this medicine in children. Special care may be  needed. Patients over 80 years of age may have a stronger reaction and need a smaller dose. Overdosage: If you think you have taken too much of this medicine contact a poison control center or emergency room at once. NOTE: This medicine is only for you. Do not share this medicine with others. What if I miss a dose? This does not apply. This medicine is not for regular use. What may interact with this medicine? Do not take this medicine with any of the following medications:  aspirin and aspirin-like medicines  cidofovir  methotrexate  NSAIDs, medicines for pain and inflammation, like ibuprofen or naproxen  pentoxifylline  probenecid This medicine may also interact with the following medications:  alcohol  alendronate  alprazolam  carbamazepine  diuretics  flavocoxid  fluoxetine  ginkgo  lithium  medicines for blood pressure like enalapril  medicines that affect platelets like pentoxifylline  medicines that treat or prevent blood clots like heparin, warfarin  muscle relaxants  pemetrexed  phenytoin  thiothixene This list may not describe all possible interactions. Give your health care provider a list of all the medicines, herbs, non-prescription drugs, or dietary supplements you use. Also tell them if you smoke, drink alcohol, or use illegal drugs. Some items may interact with your medicine. What should I watch for while using this medicine? Your condition will be monitored carefully while you are receiving this medicine. Do not use this medicine for more than 5 days. It is only used for short-term treatment of moderate to severe pain. The risk of side effects such as kidney damage and stomach bleeding are higher if used for more than 5 days. Do not take other medicines that contain aspirin, ibuprofen, or naproxen with this medicine. Side effects such as stomach upset, nausea, or ulcers may be more likely to occur. Many non-prescription medicines contain  aspirin, ibuprofen, or naproxen. Always read labels carefully. This medicine can cause serious ulcers and bleeding in the stomach. It can happen with no warning. Smoking, drinking alcohol, older age, and poor health can also increase risks. Call your health care provider right away if you have stomach pain or blood in your vomit or stool. Alcohol may interfere with the effect of this medicine. Avoid alcoholic drinks. This medicine may cause serious skin reactions. They can happen weeks to months after starting the medicine. Contact your health care provider right away if you notice fevers or flu-like symptoms with a rash. The rash may be red or purple and then turn into blisters or peeling of the skin. Or, you might notice a red rash with swelling of the face, lips or lymph nodes in your neck or under your arms. Talk to your health care provider if you are pregnant before taking this medicine. Taking this medicine between weeks 20 and 30 of pregnancy may harm your unborn baby. Your health care provider will monitor you closely if you need to take it. After 30 weeks of pregnancy, do not take this medicine. This medicine does not prevent a heart attack or stroke. This medicine may increase the chance of a heart attack or stroke. The chance may increase the longer you use this medicine or if you have heart disease. If you take aspirin to prevent a heart attack or stroke, talk to your health care provider about using this medicine. You may get drowsy or dizzy. Do not drive, use machinery, or do anything that needs mental alertness until you know how this medicine affects you. Do not stand up or sit up quickly, especially if you are an older patient. This reduces the risk of dizzy or fainting spells. What side effects may I notice from receiving this medicine? Side effects that you should report to your doctor or health care provider as soon as possible:  allergic reactions (skin rash, itching or hives;  swelling of the face, lips, or tongue)  bleeding (bloody or black, tarry stools; red or dark brown urine; spitting up blood or brown material that looks like coffee grounds; red spots on the skin; unusual bruising or bleeding from the eyes, gums, or nose)  heart attack (trouble breathing; pain or tightness in the chest, neck, back or arms; unusually weak or tired)  heart failure (trouble breathing; fast, irregular heartbeat; sudden weight gain; swelling of the ankles, feet, hands; unusually weak or tired)  kidney injury (trouble passing urine or change in the amount of  urine)  liver injury (dark yellow or brown urine; general ill feeling or flu-like symptoms; loss of appetite, right upper belly pain; unusually weak or tired, yellowing of the eyes or skin)  low red blood cell counts (trouble breathing; feeling faint; lightheaded, falls; unusually weak or tired)  rash, fever, and swollen lymph nodes  redness, blistering, peeling, or loosening of the skin, including inside the mouth  stroke (changes in vision; confusion; trouble speaking or understanding; severe headaches; sudden numbness or weakness of the face, arm or leg; trouble walking; dizziness; loss of balance or coordination) Side effects that usually do not require medical attention (report to your doctor or health care professional if they continue or are bothersome):  constipation  decreased hearing, ringing in the ears  diarrhea  headache  nausea  pain, redness, or irritation at site where injected  passing gas  stomach pain  upset stomach This list may not describe all possible side effects. Call your doctor for medical advice about side effects. You may report side effects to FDA at 1-800-FDA-1088. Where should I keep my medicine? This medicine is given in a hospital or clinic. It will not be stored at home. NOTE: This sheet is a summary. It may not cover all possible information. If you have questions about this  medicine, talk to your doctor, pharmacist, or health care provider.  2021 Elsevier/Gold Standard (2020-04-11 15:02:26) Prednisone tablets What is this medicine? PREDNISONE (PRED ni sone) is a corticosteroid. It is commonly used to treat inflammation of the skin, joints, lungs, and other organs. Common conditions treated include asthma, allergies, and arthritis. It is also used for other conditions, such as blood disorders and diseases of the adrenal glands. This medicine may be used for other purposes; ask your health care provider or pharmacist if you have questions. COMMON BRAND NAME(S): Deltasone, Predone, Sterapred, Sterapred DS What should I tell my health care provider before I take this medicine? They need to know if you have any of these conditions:  Cushing's syndrome  diabetes  glaucoma  heart disease  high blood pressure  infection (especially a virus infection such as chickenpox, cold sores, or herpes)  kidney disease  liver disease  mental illness  myasthenia gravis  osteoporosis  seizures  stomach or intestine problems  thyroid disease  an unusual or allergic reaction to lactose, prednisone, other medicines, foods, dyes, or preservatives  pregnant or trying to get pregnant  breast-feeding How should I use this medicine? Take this medicine by mouth with a glass of water. Follow the directions on the prescription label. Take this medicine with food. If you are taking this medicine once a day, take it in the morning. Do not take more medicine than you are told to take. Do not suddenly stop taking your medicine because you may develop a severe reaction. Your doctor will tell you how much medicine to take. If your doctor wants you to stop the medicine, the dose may be slowly lowered over time to avoid any side effects. Talk to your pediatrician regarding the use of this medicine in children. Special care may be needed. Overdosage: If you think you have taken  too much of this medicine contact a poison control center or emergency room at once. NOTE: This medicine is only for you. Do not share this medicine with others. What if I miss a dose? If you miss a dose, take it as soon as you can. If it is almost time for your next dose, talk to  your doctor or health care professional. You may need to miss a dose or take an extra dose. Do not take double or extra doses without advice. What may interact with this medicine? Do not take this medicine with any of the following medications:  metyrapone  mifepristone This medicine may also interact with the following medications:  aminoglutethimide  amphotericin B  aspirin and aspirin-like medicines  barbiturates  certain medicines for diabetes, like glipizide or glyburide  cholestyramine  cholinesterase inhibitors  cyclosporine  digoxin  diuretics  ephedrine  female hormones, like estrogens and birth control pills  isoniazid  ketoconazole  NSAIDS, medicines for pain and inflammation, like ibuprofen or naproxen  phenytoin  rifampin  toxoids  vaccines  warfarin This list may not describe all possible interactions. Give your health care provider a list of all the medicines, herbs, non-prescription drugs, or dietary supplements you use. Also tell them if you smoke, drink alcohol, or use illegal drugs. Some items may interact with your medicine. What should I watch for while using this medicine? Visit your doctor or health care professional for regular checks on your progress. If you are taking this medicine over a prolonged period, carry an identification card with your name and address, the type and dose of your medicine, and your doctor's name and address. This medicine may increase your risk of getting an infection. Tell your doctor or health care professional if you are around anyone with measles or chickenpox, or if you develop sores or blisters that do not heal properly. If you  are going to have surgery, tell your doctor or health care professional that you have taken this medicine within the last twelve months. Ask your doctor or health care professional about your diet. You may need to lower the amount of salt you eat. This medicine may increase blood sugar. Ask your healthcare provider if changes in diet or medicines are needed if you have diabetes. What side effects may I notice from receiving this medicine? Side effects that you should report to your doctor or health care professional as soon as possible:  allergic reactions like skin rash, itching or hives, swelling of the face, lips, or tongue  changes in emotions or moods  changes in vision  depressed mood  eye pain  fever or chills, cough, sore throat, pain or difficulty passing urine  signs and symptoms of high blood sugar such as being more thirsty or hungry or having to urinate more than normal. You may also feel very tired or have blurry vision.  swelling of ankles, feet Side effects that usually do not require medical attention (report to your doctor or health care professional if they continue or are bothersome):  confusion, excitement, restlessness  headache  nausea, vomiting  skin problems, acne, thin and shiny skin  trouble sleeping  weight gain This list may not describe all possible side effects. Call your doctor for medical advice about side effects. You may report side effects to FDA at 1-800-FDA-1088. Where should I keep my medicine? Keep out of the reach of children. Store at room temperature between 15 and 30 degrees C (59 and 86 degrees F). Protect from light. Keep container tightly closed. Throw away any unused medicine after the expiration date. NOTE: This sheet is a summary. It may not cover all possible information. If you have questions about this medicine, talk to your doctor, pharmacist, or health care provider.  2021 [employer]/Gold Standard (2018-08-16  10:54:22) Methocarbamol tablets What is this medicine? METHOCARBAMOL (  meth oh KAR ba mole) helps to relieve pain and stiffness in muscles caused by strains, sprains, or other injury to your muscles. This medicine may be used for other purposes; ask your health care provider or pharmacist if you have questions. COMMON BRAND NAME(S): Robaxin What should I tell my health care provider before I take this medicine? They need to know if you have any of these conditions:  kidney disease  seizures  an unusual or allergic reaction to methocarbamol, other medicines, foods, dyes, or preservatives  pregnant or trying to get pregnant  breast-feeding How should I use this medicine? Take this medicine by mouth with a full glass of water. Follow the directions on the prescription label. Take your medicine at regular intervals. Do not take your medicine more often than directed. Talk to your pediatrician regarding the use of this medicine in children. Special care may be needed. Overdosage: If you think you have taken too much of this medicine contact a poison control center or emergency room at once. NOTE: This medicine is only for you. Do not share this medicine with others. What if I miss a dose? If you miss a dose, take it as soon as you can. If it is almost time for your next dose, take only the next dose. Do not take double or extra doses. What may interact with this medicine? Do not take this medication with any of the following medicines:  narcotic medicines for cough This medicine may also interact with the following medications:  alcohol  antihistamines for allergy, cough and cold  certain medicines for anxiety or sleep  certain medicines for depression like amitriptyline, fluoxetine, sertraline  certain medicines for seizures like phenobarbital, primidone  cholinesterase inhibitors like neostigmine, ambenonium, and pyridostigmine bromide  general anesthetics like halothane,  isoflurane, methoxyflurane, propofol  local anesthetics like lidocaine, pramoxine, tetracaine  medicines that relax muscles for surgery  narcotic medicines for pain  phenothiazines like chlorpromazine, mesoridazine, prochlorperazine, thioridazine This list may not describe all possible interactions. Give your health care provider a list of all the medicines, herbs, non-prescription drugs, or dietary supplements you use. Also tell them if you smoke, drink alcohol, or use illegal drugs. Some items may interact with your medicine. What should I watch for while using this medicine? Tell your doctor or health care professional if your symptoms do not start to get better or if they get worse. You may get drowsy or dizzy. Do not drive, use machinery, or do anything that needs mental alertness until you know how this medicine affects you. Do not stand or sit up quickly, especially if you are an older patient. This reduces the risk of dizzy or fainting spells. Alcohol may interfere with the effect of this medicine. Avoid alcoholic drinks. If you are taking another medicine that also causes drowsiness, you may have more side effects. Give your health care provider a list of all medicines you use. Your doctor will tell you how much medicine to take. Do not take more medicine than directed. Call emergency for help if you have problems breathing or unusual sleepiness. What side effects may I notice from receiving this medicine? Side effects that you should report to your doctor or health care professional as soon as possible:  allergic reactions like skin rash, itching or hives, swelling of the face, lips, or tongue  breathing problems  confusion  seizures  unusually weak or tired Side effects that usually do not require medical attention (report to your doctor or  health care professional if they continue or are bothersome):  dizziness  headache  metallic taste  tiredness  upset stomach This  list may not describe all possible side effects. Call your doctor for medical advice about side effects. You may report side effects to FDA at 1-800-FDA-1088. Where should I keep my medicine? Keep out of the reach of children. Store at room temperature between 20 and 25 degrees C (68 and 77 degrees F). Keep container tightly closed. Throw away any unused medicine after the expiration date. NOTE: This sheet is a summary. It may not cover all possible information. If you have questions about this medicine, talk to your doctor, pharmacist, or health care provider.  2021 [employer] Standard (2015-08-27 13:11:54)

## 2020-12-27 NOTE — Progress Notes (Signed)
Patient [facility]   Established Patient Office Visit  Subjective:  [name], female    DOB: 10-22-1971  Age: 50 y.o. MRN: [id number]  CC:  Chief Complaint  Patient presents with  . Hypertension    HPI Alexandra Powers is a 50 year old female who presents for Follow Up today.    Patient Active Problem List   Diagnosis Date Noted  . CKD (chronic kidney disease), stage III ([place]) 09/09/2020  . Preventive measure 09/09/2020  . CKD (chronic kidney disease), stage II 09/08/2019  . Trochanteric bursitis of left hip 09/08/2019  . Anxiety and depression 09/01/2019  . Chronic pain syndrome 08/08/2019  . Chronic myofascial pain 08/08/2019  . Bilateral breast cysts 08/01/2019  . Cellulitis of left breast 07/14/2019  . 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 Primary osteoarthritis of both hips 07/29/2018  . Primary osteoarthritis of both knees 07/29/2018  . Primary osteoarthritis of both feet 07/29/2018  . Tonsillitis 05/02/2018  . Acute prerenal failure ([place]) 09/03/2017  . OSA (obstructive sleep apnea) 12/10/2016  . GERD (gastroesophageal reflux disease) 12/04/2016  . Low back pain at multiple sites 08/21/2016  . Chronic pain of multiple joints 08/13/2016  . MGUS (monoclonal gammopathy of unknown significance) 07/02/2016  . Vitamin D deficiency 07/02/2016  . HTN (hypertension), benign 11/11/2015  . Morbid obesity ([place]) 11/11/2015  . Adrenal mass ([place]) 08/30/2015  . Ovarian cyst 08/30/2015   Current Status: Since her last office visit, she is doing well with no complaints. She has chronic back and bilateral knee pain, which she is approved for future surgery once she had lost a percentage of her weight. She continues to struggle with weight loss. She denies fevers, chills, fatigue, recent infections, weight loss, and night sweats. She has not had any headaches, visual changes, dizziness,  and falls. No chest pain, heart palpitations, cough and shortness of breath reported. Denies GI problems such as nausea, vomiting, diarrhea, and constipation. She has no reports of blood in stools, dysuria and hematuria. No depression or anxiety reported today. She is taking all medications as prescribed.   Past Medical History:  Diagnosis Date  . Arthritis   . Gallstones   . GERD (gastroesophageal reflux disease)   . Hypertension   . MGUS (monoclonal gammopathy of unknown significance)   . Obesity   . Osteoarthritis   . PONV (postoperative nausea and vomiting)   . Rheumatoid arthritis ([place])   . Sleep apnea    SENT CPAP BACK, NOT WORN IN 7-8 MONTHS    Past Surgical History:  Procedure Laterality Date  . BREAST DUCTAL SYSTEM EXCISION Bilateral 10/12/2017   Procedure: EXCISION OF BILATERAL CENTRAL DUCT;  Surgeon: Erroll Luna, MD;  Location: [place];  Service: General;  Laterality: Bilateral;  . BREAST EXCISIONAL BIOPSY    . CHOLECYSTECTOMY N/A 12/21/2015   Procedure: LAPAROSCOPIC CHOLECYSTECTOMY;  Surgeon: Georganna Skeans, MD;  Location: [place];  Service: General;  Laterality: N/A;  . UMBILICAL HERNIA REPAIR N/A 12/27/2015   Procedure: INCARCERATED UMBILICAL HERNIA REPAIR;  Surgeon: Coralie Keens, MD;  Location: [facility];  Service: General;  Laterality: N/A;    Family History  Problem Relation Age of Onset  . Arthritis Mother   . High blood pressure Mother   . Sleep apnea Mother   . High blood pressure Father   . Breast cancer Paternal Aunt   . Arthritis Sister   .  Healthy Daughter   . Healthy Son   . Adrenal disorder Neg Hx   . Colon cancer Neg Hx   . Esophageal cancer Neg Hx   . Pancreatic cancer Neg Hx   . Stomach cancer Neg Hx   . Liver disease Neg Hx     Social History   Socioeconomic History  . Marital status: Married    Spouse name: Selyna Klahn  . Number of children: 2  . Years of education: Not on file  . Highest education level: High school graduate   Occupational History  . Occupation: [employer] at [employer]: unemploed  Tobacco Use  . Smoking status: Current Every Day Smoker    Packs/day: 0.25    Years: 24.00    Pack years: 6.00    Types: Cigarettes  . Smokeless tobacco: Never Used  Vaping Use  . Vaping Use: Never used  Substance and Sexual Activity  . Alcohol use: Yes    Alcohol/week: 2.0 standard drinks    Types: 2 Shots of liquor per week    Comment: per week  . Drug use: Yes    Types: Marijuana  . Sexual activity: Yes    Partners: Male    Birth control/protection: None  Other Topics Concern  . Not on file  Social History Narrative   Lives with husband, Berneta Sages.   Social Determinants of Health   Financial Resource Strain: Not on file  Food Insecurity: Not on file  Transportation Needs: No Transportation Needs  . Lack of Transportation (Medical): No  . Lack of Transportation (Non-Medical): No  Physical Activity: Not on file  Stress: Not on file  Social Connections: Not on file  Intimate Partner Violence: Not on file    Outpatient Medications Prior to Visit  Medication Sig Dispense Refill  . tizanidine (ZANAFLEX) 6 MG capsule Take 1 capsule (6 mg total) by mouth 3 (three) times daily as needed for muscle spasms. 270 capsule 3  . amLODipine (NORVASC) 10 MG tablet Take 1 tablet (10 mg total) by mouth daily. Needs office visit for more refills 90 tablet 3  . hydrochlorothiazide (HYDRODIURIL) 25 MG tablet Take 1 tablet (25 mg total) by mouth daily. 90 tablet 3  . lisinopril (ZESTRIL) 40 MG tablet Take 1 tablet (40 mg total) by mouth daily. 90 tablet 1  . DULoxetine (CYMBALTA) 60 MG capsule Take 1 capsule (60 mg total) by mouth daily. (Patient not taking: Reported on 12/27/2020) 90 capsule 3  . gabapentin (NEURONTIN) 300 MG capsule Take 1 capsule (300 mg total) by mouth at bedtime. (Patient not taking: Reported on 12/27/2020) 90 capsule 3  . medroxyPROGESTERone (PROVERA) 5 MG tablet Take one tablet po qd x 5 days  every other month until you go 6 months without bleeding. Start on August 1st. (Patient not taking: No sig reported) 15 tablet 1  . naproxen sodium (ANAPROX DS) 550 MG tablet Take 1 tablet (550 mg total) by mouth 2 (two) times daily with a meal. Take prn pain (Patient not taking: Reported on 12/27/2020) 30 tablet 1  . Vitamin D, Ergocalciferol, (DRISDOL) 1.25 MG (50000 UNIT) CAPS capsule Take 1 capsule (50,000 Units total) by mouth every 7 (seven) days. (Patient not taking: Reported on 12/11/2020) 5 capsule 5   No facility-administered medications prior to visit.    No Known Allergies  ROS Review of Systems  Constitutional: Negative.   HENT: Negative.   Eyes: Negative.   Respiratory: Negative.   Cardiovascular: Negative.  Gastrointestinal: Negative.   Endocrine: Negative.   Genitourinary: Negative.   Musculoskeletal: Positive for arthralgias (generalized joint pian).       Chronic bilateral knee pain  Skin: Negative.       Objective:    Physical Exam Vitals and nursing note reviewed.  Constitutional:      Appearance: Normal appearance.  HENT:     Head: Normocephalic and atraumatic.     Nose: Nose normal.     Mouth/Throat:     Mouth: Mucous membranes are moist.     Pharynx: Oropharynx is clear.  Cardiovascular:     Rate and Rhythm: Normal rate and regular rhythm.     Pulses: Normal pulses.     Heart sounds: Normal heart sounds.  Pulmonary:     Effort: Pulmonary effort is normal.     Breath sounds: Normal breath sounds.  Abdominal:     General: Bowel sounds are normal.     Palpations: Abdomen is soft.  Musculoskeletal:        General: Normal range of motion.     Cervical back: Normal range of motion and neck supple.  Skin:    General: Skin is warm and dry.  Neurological:     General: No focal deficit present.     Mental Status: She is alert and oriented to person, place, and time.  Psychiatric:        Mood and Affect: Mood normal.        Behavior: Behavior  normal.        Thought Content: Thought content normal.        Judgment: Judgment normal.     BP (!) 150/92   Pulse 92   Temp 98.2 F (36.8 C) (Temporal)   Ht $R'5\' 8"'er$  (1.727 m)   Wt (!) 372 lb (168.7 kg)   SpO2 98%   BMI 56.56 kg/m  Wt Readings from Last 3 Encounters:  12/27/20 (!) 372 lb (168.7 kg)  12/11/20 (!) 363 lb (164.7 kg)  09/10/20 (!) 363 lb 12.8 oz (165 kg)     Health Maintenance Due  Topic Date Due  . COVID-19 Vaccine (1) Never done  . COLONOSCOPY (Pts 45-39yrs Insurance coverage will need to be confirmed)  Never done    There are no preventive care reminders to display for this patient.  Lab Results  Component Value Date   TSH 0.992 09/10/2020   Lab Results  Component Value Date   WBC 10.4 09/02/2020   HGB 11.6 (L) 09/02/2020   HCT 36.3 09/02/2020   MCV 98.1 09/02/2020   PLT 269 09/02/2020   Lab Results  Component Value Date   NA 141 09/02/2020   K 3.9 09/02/2020   CHLORIDE 97 (L) 08/05/2017   CO2 26 09/02/2020   GLUCOSE 88 09/02/2020   BUN 12 09/02/2020   CREATININE 1.20 (H) 09/02/2020   BILITOT 0.5 09/02/2020   ALKPHOS 77 09/02/2020   AST 19 09/02/2020   ALT 14 09/02/2020   PROT 7.7 09/02/2020   ALBUMIN 3.3 (L) 09/02/2020   CALCIUM 9.4 09/02/2020   ANIONGAP 7 09/02/2020   EGFR 38 (L) 08/05/2017   GFR 68.88 06/24/2018   Lab Results  Component Value Date   CHOL 193 09/10/2020   Lab Results  Component Value Date   HDL 57 09/10/2020   Lab Results  Component Value Date   LDLCALC 124 (H) 09/10/2020   Lab Results  Component Value Date   TRIG 66 09/10/2020   Lab Results  Component Value Date   CHOLHDL 3.4 09/10/2020   Lab Results  Component Value Date   HGBA1C 5.3 09/11/2020      Assessment & Plan:   1. Chronic back pain, unspecified back location, unspecified back pain laterality - ketorolac (TORADOL) injection 60 mg - Ambulatory referral to Pain Clinic - methocarbamol (ROBAXIN-750) 750 MG tablet; Take 1 tablet (750 mg  total) by mouth 3 (three) times daily.  Dispense: 270 tablet; Refill: 1 - predniSONE (DELTASONE) 10 MG tablet; Day #1: Take 6 tablets by mouth Day #2: Take 5 tablets by mouth Day #3: Take 4 tablets by mouth Day #4: Take 3 tablets by mouth Day #5: Take 2 tablets by mouth Day #6: Take 1 tablet, then complete.  Dispense: 21 tablet; Refill: 0 - Amb Referral to Bariatric Surgery  2. Bilateral chronic knee pain - Ambulatory referral to Pain Clinic - methocarbamol (ROBAXIN-750) 750 MG tablet; Take 1 tablet (750 mg total) by mouth 3 (three) times daily.  Dispense: 270 tablet; Refill: 1 - predniSONE (DELTASONE) 10 MG tablet; Day #1: Take 6 tablets by mouth Day #2: Take 5 tablets by mouth Day #3: Take 4 tablets by mouth Day #4: Take 3 tablets by mouth Day #5: Take 2 tablets by mouth Day #6: Take 1 tablet, then complete.  Dispense: 21 tablet; Refill: 0 - Amb Referral to Bariatric Surgery  3. HTN (hypertension), benign She will continue to take medications as prescribed, to decrease high sodium intake, excessive alcohol intake, increase potassium intake, smoking cessation, and increase physical activity of at least 30 minutes of cardio activity daily. She will continue to follow Heart Healthy or DASH diet. - amLODipine (NORVASC) 10 MG tablet; Take 1 tablet (10 mg total) by mouth daily. Needs office visit for more refills  Dispense: 90 tablet; Refill: 3 - hydrochlorothiazide (HYDRODIURIL) 25 MG tablet; Take 1 tablet (25 mg total) by mouth daily.  Dispense: 90 tablet; Refill: 3 - lisinopril (ZESTRIL) 40 MG tablet; Take 1 tablet (40 mg total) by mouth daily.  Dispense: 90 tablet; Refill: 3  4. Follow up She will follow up in 3-4 months.   Meds ordered this encounter  Medications  . ketorolac (TORADOL) injection 60 mg  . amLODipine (NORVASC) 10 MG tablet    Sig: Take 1 tablet (10 mg total) by mouth daily. Needs office visit for more refills    Dispense:  90 tablet    Refill:  3  .  hydrochlorothiazide (HYDRODIURIL) 25 MG tablet    Sig: Take 1 tablet (25 mg total) by mouth daily.    Dispense:  90 tablet    Refill:  3  . lisinopril (ZESTRIL) 40 MG tablet    Sig: Take 1 tablet (40 mg total) by mouth daily.    Dispense:  90 tablet    Refill:  3  . methocarbamol (ROBAXIN-750) 750 MG tablet    Sig: Take 1 tablet (750 mg total) by mouth 3 (three) times daily.    Dispense:  270 tablet    Refill:  1  . predniSONE (DELTASONE) 10 MG tablet    Sig: Day #1: Take 6 tablets by mouth Day #2: Take 5 tablets by mouth Day #3: Take 4 tablets by mouth Day #4: Take 3 tablets by mouth Day #5: Take 2 tablets by mouth Day #6: Take 1 tablet, then complete.    Dispense:  21 tablet    Refill:  0    Orders Placed This Encounter  Procedures  . Ambulatory referral  to Pain Clinic  . Amb Referral to Bariatric Surgery     Referral Orders     Ambulatory referral to Pain Clinic     Amb Referral to Bariatric Surgery   Kathe Becton, MSN, ANE, FNP-BC [facility] [place], [place] [address] [phone] fax    Problem List Items Addressed This Visit      Cardiovascular and Mediastinum   HTN (hypertension), benign   Relevant Medications   amLODipine (NORVASC) 10 MG tablet   hydrochlorothiazide (HYDRODIURIL) 25 MG tablet   lisinopril (ZESTRIL) 40 MG tablet    Other Visit Diagnoses    Chronic back pain, unspecified back location, unspecified back pain laterality    -  Primary   Relevant Medications   ketorolac (TORADOL) injection 60 mg (Completed)   methocarbamol (ROBAXIN-750) 750 MG tablet   predniSONE (DELTASONE) 10 MG tablet   Other Relevant Orders   Ambulatory referral to Pain Clinic   Amb Referral to Bariatric Surgery   Bilateral chronic knee pain       Relevant Medications   ketorolac (TORADOL) injection 60 mg (Completed)   methocarbamol (ROBAXIN-750)  750 MG tablet   predniSONE (DELTASONE) 10 MG tablet   Other Relevant Orders   Ambulatory referral to Pain Clinic   Amb Referral to Bariatric Surgery   Follow up          Meds ordered this encounter  Medications  . ketorolac (TORADOL) injection 60 mg  . amLODipine (NORVASC) 10 MG tablet    Sig: Take 1 tablet (10 mg total) by mouth daily. Needs office visit for more refills    Dispense:  90 tablet    Refill:  3  . hydrochlorothiazide (HYDRODIURIL) 25 MG tablet    Sig: Take 1 tablet (25 mg total) by mouth daily.    Dispense:  90 tablet    Refill:  3  . lisinopril (ZESTRIL) 40 MG tablet    Sig: Take 1 tablet (40 mg total) by mouth daily.    Dispense:  90 tablet    Refill:  3  . methocarbamol (ROBAXIN-750) 750 MG tablet    Sig: Take 1 tablet (750 mg total) by mouth 3 (three) times daily.    Dispense:  270 tablet    Refill:  1  . predniSONE (DELTASONE) 10 MG tablet    Sig: Day #1: Take 6 tablets by mouth Day #2: Take 5 tablets by mouth Day #3: Take 4 tablets by mouth Day #4: Take 3 tablets by mouth Day #5: Take 2 tablets by mouth Day #6: Take 1 tablet, then complete.    Dispense:  21 tablet    Refill:  0    Follow-up: No follow-ups on file.    Azzie Glatter, FNP

## 2020-12-30 ENCOUNTER — Encounter: Payer: Self-pay | Admitting: Family Medicine

## 2020-12-31 ENCOUNTER — Ambulatory Visit: Payer: No Typology Code available for payment source | Admitting: Family Medicine

## 2021-01-24 ENCOUNTER — Ambulatory Visit
Admission: RE | Admit: 2021-01-24 | Discharge: 2021-01-24 | Disposition: A | Discharge status: Home / Self Care | Payer: No Typology Code available for payment source | Source: Ambulatory Visit | Attending: Orthopaedic Surgery | Admitting: Orthopaedic Surgery

## 2021-01-24 ENCOUNTER — Other Ambulatory Visit: Payer: Self-pay | Admitting: Orthopaedic Surgery

## 2021-01-24 DIAGNOSIS — M5416 Radiculopathy, lumbar region: Secondary | ICD-10-CM

## 2021-01-24 DIAGNOSIS — M2392 Unspecified internal derangement of left knee: Secondary | ICD-10-CM

## 2021-01-24 DIAGNOSIS — M2391 Unspecified internal derangement of right knee: Secondary | ICD-10-CM

## 2021-04-22 ENCOUNTER — Other Ambulatory Visit: Payer: Self-pay | Admitting: Orthopaedic Surgery

## 2021-04-22 DIAGNOSIS — M5416 Radiculopathy, lumbar region: Secondary | ICD-10-CM

## 2021-04-24 ENCOUNTER — Encounter: Payer: Self-pay | Admitting: Nurse Practitioner

## 2021-04-24 ENCOUNTER — Other Ambulatory Visit: Payer: Self-pay

## 2021-04-24 ENCOUNTER — Ambulatory Visit (INDEPENDENT_AMBULATORY_CARE_PROVIDER_SITE_OTHER): Payer: Self-pay | Admitting: Nurse Practitioner

## 2021-04-24 VITALS — BP 151/96 | HR 71 | Temp 97.2°F | Ht 68.0 in | Wt 364.0 lb

## 2021-04-24 DIAGNOSIS — M25562 Pain in left knee: Secondary | ICD-10-CM

## 2021-04-24 DIAGNOSIS — M25561 Pain in right knee: Secondary | ICD-10-CM

## 2021-04-24 DIAGNOSIS — E278 Other specified disorders of adrenal gland: Secondary | ICD-10-CM

## 2021-04-24 DIAGNOSIS — F17209 Nicotine dependence, unspecified, with unspecified nicotine-induced disorders: Secondary | ICD-10-CM

## 2021-04-24 DIAGNOSIS — M2550 Pain in unspecified joint: Secondary | ICD-10-CM

## 2021-04-24 DIAGNOSIS — I10 Essential (primary) hypertension: Secondary | ICD-10-CM

## 2021-04-24 DIAGNOSIS — M05852 Other rheumatoid arthritis with rheumatoid factor of left hip: Secondary | ICD-10-CM

## 2021-04-24 DIAGNOSIS — M549 Dorsalgia, unspecified: Secondary | ICD-10-CM

## 2021-04-24 DIAGNOSIS — G894 Chronic pain syndrome: Secondary | ICD-10-CM

## 2021-04-24 DIAGNOSIS — Z13220 Encounter for screening for lipoid disorders: Secondary | ICD-10-CM

## 2021-04-24 DIAGNOSIS — G8929 Other chronic pain: Secondary | ICD-10-CM

## 2021-04-24 MED ORDER — TIZANIDINE HCL 6 MG PO CAPS: ORAL_CAPSULE | ORAL | 3 refills | Status: AC

## 2021-04-24 MED ORDER — LISINOPRIL 40 MG PO TABS
40.0000 mg | ORAL_TABLET | Freq: Every day | ORAL | 3 refills | Status: DC
Start: 2021-04-24 — End: 2022-08-31

## 2021-04-24 MED ORDER — AMLODIPINE BESYLATE 10 MG PO TABS: 10.0000 mg | ORAL_TABLET | Freq: Every day | ORAL | 3 refills | Status: AC

## 2021-04-24 MED ORDER — GABAPENTIN 300 MG PO CAPS: ORAL_CAPSULE | Freq: Every day | ORAL | 3 refills | Status: DC

## 2021-04-24 MED ORDER — HYDROCHLOROTHIAZIDE 25 MG PO TABS
25.0000 mg | ORAL_TABLET | Freq: Every day | ORAL | 3 refills | Status: DC
Start: 2021-04-24 — End: 2022-08-31

## 2021-04-24 MED ORDER — DULOXETINE HCL 60 MG PO CPEP: ORAL_CAPSULE | Freq: Every day | ORAL | 3 refills | Status: DC

## 2021-04-24 NOTE — Patient Instructions (Signed)
Managing Your Hypertension Hypertension, also called high blood pressure, is when the force of the blood pressing against the walls of the arteries is too strong. Arteries are blood vessels that carry blood from your heart throughout your body. Hypertension forces the heart to work harder to pump blood and may cause the arteries to become narrow or stiff. Understanding blood pressure readings Your personal target blood pressure may vary depending on your medical conditions, your age, and other factors. A blood pressure reading includes a higher number over a lower number. Ideally, your blood pressure should be below 120/80. You should know that:  The first, or top, number is called the systolic pressure. It is a measure of the pressure in your arteries as your heart beats.  The second, or bottom number, is called the diastolic pressure. It is a measure of the pressure in your arteries as the heart relaxes. Blood pressure is classified into four stages. Based on your blood pressure reading, your health care provider may use the following stages to determine what type of treatment you need, if any. Systolic pressure and diastolic pressure are measured in a unit called mmHg. Normal  Systolic pressure: below 120.  Diastolic pressure: below 80. Elevated  Systolic pressure: 120-129.  Diastolic pressure: below 80. Hypertension stage 1  Systolic pressure: 130-139.  Diastolic pressure: 80-89. Hypertension stage 2  Systolic pressure: 140 or above.  Diastolic pressure: 90 or above. How can this condition affect me? Managing your hypertension is an important responsibility. Over time, hypertension can damage the arteries and decrease blood flow to important parts of the body, including the brain, heart, and kidneys. Having untreated or uncontrolled hypertension can lead to:  A heart attack.  A stroke.  A weakened blood vessel (aneurysm).  Heart failure.  Kidney damage.  Eye  damage.  Metabolic syndrome.  Memory and concentration problems.  Vascular dementia. What actions can I take to manage this condition? Hypertension can be managed by making lifestyle changes and possibly by taking medicines. Your health care provider will help you make a plan to bring your blood pressure within a normal range. Nutrition  Eat a diet that is high in fiber and potassium, and low in salt (sodium), added sugar, and fat. An example eating plan is called the Dietary Approaches to Stop Hypertension (DASH) diet. To eat this way: ? Eat plenty of fresh fruits and vegetables. Try to fill one-half of your plate at each meal with fruits and vegetables. ? Eat whole grains, such as whole-wheat pasta, brown rice, or whole-grain bread. Fill about one-fourth of your plate with whole grains. ? Eat low-fat dairy products. ? Avoid fatty cuts of meat, processed or cured meats, and poultry with skin. Fill about one-fourth of your plate with lean proteins such as fish, chicken without skin, beans, eggs, and tofu. ? Avoid pre-made and processed foods. These tend to be higher in sodium, added sugar, and fat.  Reduce your daily sodium intake. Most people with hypertension should eat less than 1,500 mg of sodium a day.   Lifestyle  Work with your health care provider to maintain a healthy body weight or to lose weight. Ask what an ideal weight is for you.  Get at least 30 minutes of exercise that causes your heart to beat faster (aerobic exercise) most days of the week. Activities may include walking, swimming, or biking.  Include exercise to strengthen your muscles (resistance exercise), such as weight lifting, as part of your weekly exercise routine. Try   to do these types of exercises for 30 minutes at least 3 days a week.  Do not use any products that contain nicotine or tobacco, such as cigarettes, e-cigarettes, and chewing tobacco. If you need help quitting, ask your health care  provider.  Control any long-term (chronic) conditions you have, such as high cholesterol or diabetes.  Identify your sources of stress and find ways to manage stress. This may include meditation, deep breathing, or making time for fun activities.   Alcohol use  Do not drink alcohol if: ? Your health care provider tells you not to drink. ? You are pregnant, may be pregnant, or are planning to become pregnant.  If you drink alcohol: ? Limit how much you use to:  0-1 drink a day for women.  0-2 drinks a day for men. ? Be aware of how much alcohol is in your drink. In the [place] one drink equals one 12 oz bottle of beer (355 mL), one 5 oz glass of wine (148 mL), or one 1 oz glass of hard liquor (44 mL). Medicines Your health care provider may prescribe medicine if lifestyle changes are not enough to get your blood pressure under control and if:  Your systolic blood pressure is 130 or higher.  Your diastolic blood pressure is 80 or higher. Take medicines only as told by your health care provider. Follow the directions carefully. Blood pressure medicines must be taken as told by your health care provider. The medicine does not work as well when you skip doses. Skipping doses also puts you at risk for problems. Monitoring Before you monitor your blood pressure:  Do not smoke, drink caffeinated beverages, or exercise within 30 minutes before taking a measurement.  Use the bathroom and empty your bladder (urinate).  Sit quietly for at least 5 minutes before taking measurements. Monitor your blood pressure at home as told by your health care provider. To do this:  Sit with your back straight and supported.  Place your feet flat on the floor. Do not cross your legs.  Support your arm on a flat surface, such as a table. Make sure your upper arm is at heart level.  Each time you measure, take two or three readings one minute apart and record the results. You may also need to have your  blood pressure checked regularly by your health care provider.   General information  Talk with your health care provider about your diet, exercise habits, and other lifestyle factors that may be contributing to hypertension.  Review all the medicines you take with your health care provider because there may be side effects or interactions.  Keep all visits as told by your health care provider. Your health care provider can help you create and adjust your plan for managing your high blood pressure. Where to find more information  National Heart, Lung, and Blood Institute: www.nhlbi.nih.gov  American Heart Association: www.heart.org Contact a health care provider if:  You think you are having a reaction to medicines you have taken.  You have repeated (recurrent) headaches.  You feel dizzy.  You have swelling in your ankles.  You have trouble with your vision. Get help right away if:  You develop a severe headache or confusion.  You have unusual weakness or numbness, or you feel faint.  You have severe pain in your chest or abdomen.  You vomit repeatedly.  You have trouble breathing. These symptoms may represent a serious problem that is an emergency. Do not wait   to see if the symptoms will go away. Get medical help right away. Call your local emergency services (911 in the [place] Do not drive yourself to the hospital. Summary  Hypertension is when the force of blood pumping through your arteries is too strong. If this condition is not controlled, it may put you at risk for serious complications.  Your personal target blood pressure may vary depending on your medical conditions, your age, and other factors. For most people, a normal blood pressure is less than 120/80.  Hypertension is managed by lifestyle changes, medicines, or both.  Lifestyle changes to help manage hypertension include losing weight, eating a healthy, low-sodium diet, exercising more, stopping smoking, and  limiting alcohol. This information is not intended to replace advice given to you by your health care provider. Make sure you discuss any questions you have with your health care provider. Document Revised: 12/22/2019 Document Reviewed: 10/17/2019 Elsevier Patient Education  2021 [place] .   Steps to Quit Smoking Smoking tobacco is the leading cause of preventable death. It can affect almost every organ in the body. Smoking puts you and people around you at risk for many serious, long-lasting (chronic) diseases. Quitting smoking can be hard, but it is one of the best things that you can do for your health. It is never too late to quit. How do I get ready to quit? When you decide to quit smoking, make a plan to help you succeed. Before you quit:  Pick a date to quit. Set a date within the next 2 weeks to give you time to prepare.  Write down the reasons why you are quitting. Keep this list in places where you will see it often.  Tell your family, friends, and co-workers that you are quitting. Their support is important.  Talk with your doctor about the choices that may help you quit.  Find out if your health insurance will pay for these treatments.  Know the people, places, things, and activities that make you want to smoke (triggers). Avoid them. What first steps can I take to quit smoking?  Throw away all cigarettes at home, at work, and in your car.  Throw away the things that you use when you smoke, such as ashtrays and lighters.  Clean your car. Make sure to empty the ashtray.  Clean your home, including curtains and carpets. What can I do to help me quit smoking? Talk with your doctor about taking medicines and seeing a counselor at the same time. You are more likely to succeed when you do both.  If you are pregnant or breastfeeding, talk with your doctor about counseling or other ways to quit smoking. Do not take medicine to help you quit smoking unless your doctor tells  you to do so. To quit smoking: Quit right away  Quit smoking totally, instead of slowly cutting back on how much you smoke over a period of time.  Go to counseling. You are more likely to quit if you go to counseling sessions regularly. Take medicine You may take medicines to help you quit. Some medicines need a prescription, and some you can buy over-the-counter. Some medicines may contain a drug called nicotine to replace the nicotine in cigarettes. Medicines may:  Help you to stop having the desire to smoke (cravings).  Help to stop the problems that come when you stop smoking (withdrawal symptoms). Your doctor may ask you to use:  Nicotine patches, gum, or lozenges.  Nicotine inhalers or sprays.  Non-nicotine medicine that is taken by mouth. Find resources Find resources and other ways to help you quit smoking and remain smoke-free after you quit. These resources are most helpful when you use them often. They include:  Online chats with a Social worker.  Phone quitlines.  Printed Furniture conservator/restorer.  Support groups or group counseling.  Text messaging programs.  Mobile phone apps. Use apps on your mobile phone or tablet that can help you stick to your quit plan. There are many free apps for mobile phones and tablets as well as websites. Examples include Quit Guide from the [employer] and smokefree.gov   What things can I do to make it easier to quit?  Talk to your family and friends. Ask them to support and encourage you.  Call a phone quitline (1-800-QUIT-NOW), reach out to support groups, or work with a Social worker.  Ask people who smoke to not smoke around you.  Avoid places that make you want to smoke, such as: ? Bars. ? Parties. ? Smoke-break areas at work.  Spend time with people who do not smoke.  Lower the stress in your life. Stress can make you want to smoke. Try these things to help your stress: ? Getting regular exercise. ? Doing deep-breathing  exercises. ? Doing yoga. ? Meditating. ? Doing a body scan. To do this, close your eyes, focus on one area of your body at a time from head to toe. Notice which parts of your body are tense. Try to relax the muscles in those areas.   How will I feel when I quit smoking? Day 1 to 3 weeks Within the first 24 hours, you may start to have some problems that come from quitting tobacco. These problems are very bad 2-3 days after you quit, but they do not often last for more than 2-3 weeks. You may get these symptoms:  Mood swings.  Feeling restless, nervous, angry, or annoyed.  Trouble concentrating.  Dizziness.  Strong desire for high-sugar foods and nicotine.  Weight gain.  Trouble pooping (constipation).  Feeling like you may vomit (nausea).  Coughing or a sore throat.  Changes in how the medicines that you take for other issues work in your body.  Depression.  Trouble sleeping (insomnia). Week 3 and afterward After the first 2-3 weeks of quitting, you may start to notice more positive results, such as:  Better sense of smell and taste.  Less coughing and sore throat.  Slower heart rate.  Lower blood pressure.  Clearer skin.  Better breathing.  Fewer sick days. Quitting smoking can be hard. Do not give up if you fail the first time. Some people need to try a few times before they succeed. Do your best to stick to your quit plan, and talk with your doctor if you have any questions or concerns. Summary  Smoking tobacco is the leading cause of preventable death. Quitting smoking can be hard, but it is one of the best things that you can do for your health.  When you decide to quit smoking, make a plan to help you succeed.  Quit smoking right away, not slowly over a period of time.  When you start quitting, seek help from your doctor, family, or friends. This information is not intended to replace advice given to you by your health care provider. Make sure you  discuss any questions you have with your health care provider. Document Revised: 08/11/2019 Document Reviewed: 02/04/2019 Elsevier Patient Education  [place].

## 2021-04-24 NOTE — Progress Notes (Signed)
Alexandra Powers, [place]  [address] Phone:  [phone]   Fax:  [phone]   Established Patient Office Visit  Subjective:  [name], female    DOB: March 06, 1971  Age: 50 y.o. MRN: [id number]  CC:  Chief Complaint  Patient presents with  . Follow-up    Routine fu    HPI Alexandra Powers presents for follow up. A former patient of NP [place]. She  has a past medical history of Arthritis, Gallstones, GERD (gastroesophageal reflux disease), Hypertension, MGUS (monoclonal gammopathy of unknown significance), Obesity, Osteoarthritis, PONV (postoperative nausea and vomiting), Rheumatoid arthritis (Alexandra Powers), and Sleep apnea.   Hypertension Patient is here for follow-up of elevated blood pressure. She is not exercising and is adherent to a low-salt diet. Blood pressure is not well monitored at home. Cardiac symptoms: none. Patient denies chest pain. Cardiovascular risk factors: hypertension, obesity (BMI >= 30 kg/m2) and sedentary lifestyle. Use of agents associated with hypertension: none. History of target organ damage: chronic kidney disease.  She has been watching her diet. She is going to start water therapy.  Past Medical History:  Diagnosis Date  . Arthritis   . Gallstones   . GERD (gastroesophageal reflux disease)   . Hypertension   . MGUS (monoclonal gammopathy of unknown significance)   . Obesity   . Osteoarthritis   . PONV (postoperative nausea and vomiting)   . Rheumatoid arthritis (Alexandra Powers)   . Sleep apnea    SENT CPAP BACK, NOT WORN IN 7-8 MONTHS    Past Surgical History:  Procedure Laterality Date  . BREAST DUCTAL SYSTEM EXCISION Bilateral 10/12/2017   Procedure: EXCISION OF BILATERAL CENTRAL DUCT;  Surgeon: Alexandra Luna, MD;  Location: [facility];  Service: General;  Laterality: Bilateral;  . BREAST EXCISIONAL BIOPSY    . CHOLECYSTECTOMY N/A 12/21/2015   Procedure: LAPAROSCOPIC CHOLECYSTECTOMY;  Surgeon: Alexandra Skeans, MD;   Location: [facility];  Service: General;  Laterality: N/A;  . UMBILICAL HERNIA REPAIR N/A 12/27/2015   Procedure: INCARCERATED UMBILICAL HERNIA REPAIR;  Surgeon: Alexandra Keens, MD;  Location: [facility];  Service: General;  Laterality: N/A;    Family History  Problem Relation Age of Onset  . Arthritis Mother   . High blood pressure Mother   . Sleep apnea Mother   . High blood pressure Father   . Breast cancer Paternal Aunt   . Arthritis Sister   . Healthy Daughter   . Healthy Son   . Adrenal disorder Neg Hx   . Colon cancer Neg Hx   . Esophageal cancer Neg Hx   . Pancreatic cancer Neg Hx   . Stomach cancer Neg Hx   . Liver disease Neg Hx     Social History   Socioeconomic History  . Marital status: Married    Spouse name: Alexandra Powers  . Number of children: 2  . Years of education: Not on file  . Highest education level: High school graduate  Occupational History  . Occupation: Stay at [employer]: unemploed  Tobacco Use  . Smoking status: Current Every Day Smoker    Packs/day: 0.25    Years: 24.00    Pack years: 6.00    Types: Cigarettes  . Smokeless tobacco: Never Used  Vaping Use  . Vaping Use: Never used  Substance and Sexual Activity  . Alcohol use: Yes    Alcohol/week: 2.0 standard drinks    Types: 2 Shots of  liquor per week    Comment: per week  . Drug use: Yes    Types: Marijuana  . Sexual activity: Yes    Partners: Male    Birth control/protection: None  Other Topics Concern  . Not on file  Social History Narrative   Lives with husband, Alexandra Powers.   Social Determinants of Health   Financial Resource Strain: Not on file  Food Insecurity: Not on file  Transportation Needs: No Transportation Needs  . Lack of Transportation (Medical): No  . Lack of Transportation (Non-Medical): No  Physical Activity: Not on file  Stress: Not on file  Social Connections: Not on file  Intimate Partner Violence: Not on file    Outpatient Medications Prior to Visit   Medication Sig Dispense Refill  . methocarbamol (ROBAXIN-750) 750 MG tablet Take 1 tablet (750 mg total) by mouth 3 (three) times daily. 270 tablet 1  . amLODipine (NORVASC) 10 MG tablet Take 1 tablet (10 mg total) by mouth daily. Needs office visit for more refills 90 tablet 3  . DULoxetine (CYMBALTA) 60 MG capsule TAKE 1 CAPSULE (60 MG TOTAL) BY MOUTH DAILY. 90 capsule 3  . gabapentin (NEURONTIN) 300 MG capsule TAKE 1 CAPSULE (300 MG TOTAL) BY MOUTH AT BEDTIME. 90 capsule 3  . hydrochlorothiazide (HYDRODIURIL) 25 MG tablet Take 1 tablet (25 mg total) by mouth daily. 90 tablet 3  . lisinopril (ZESTRIL) 40 MG tablet Take 1 tablet (40 mg total) by mouth daily. 90 tablet 3  . naproxen sodium (ANAPROX DS) 550 MG tablet Take 1 tablet (550 mg total) by mouth 2 (two) times daily with a meal. Take prn pain 30 tablet 1  . Vitamin D, Ergocalciferol, (DRISDOL) 1.25 MG (50000 UNIT) CAPS capsule Take 1 capsule (50,000 Units total) by mouth every 7 (seven) days. (Patient not taking: Reported on 12/11/2020) 5 capsule 5  . celecoxib (CELEBREX) 100 MG capsule Take by mouth.    . medroxyPROGESTERone (PROVERA) 5 MG tablet Take one tablet po qd x 5 days every other month until you go 6 months without bleeding. Start on August 1st. (Patient not taking: No sig reported) 15 tablet 1  . meloxicam (MOBIC) 15 MG tablet 1 tablet    . predniSONE (DELTASONE) 10 MG tablet Day #1: Take 6 tablets by mouth Day #2: Take 5 tablets by mouth Day #3: Take 4 tablets by mouth Day #4: Take 3 tablets by mouth Day #5: Take 2 tablets by mouth Day #6: Take 1 tablet, then complete. 21 tablet 0  . tizanidine (ZANAFLEX) 6 MG capsule TAKE 1 CAPSULE (6 MG TOTAL) BY MOUTH 3 (THREE) TIMES DAILY AS NEEDED FOR MUSCLE SPASMS. 270 capsule 3   No facility-administered medications prior to visit.    No Known Allergies  ROS Review of Systems  Respiratory: Negative for shortness of breath.   Cardiovascular: Negative for chest pain.   Neurological: Positive for headaches (related to BP she is aware). Negative for dizziness.      Objective:    Physical Exam Constitutional:      Appearance: She is obese.  HENT:     Head: Normocephalic and atraumatic.     Nose: Nose normal.     Mouth/Throat:     Mouth: Mucous membranes are moist.  Cardiovascular:     Rate and Rhythm: Normal rate and regular rhythm.     Pulses: Normal pulses.     Heart sounds: Normal heart sounds.  Pulmonary:     Effort: Pulmonary effort is normal.  Breath sounds: Normal breath sounds.  Musculoskeletal:        General: Tenderness (knees BL) present.     Cervical back: Normal range of motion.     Right lower leg: No edema.     Left lower leg: No edema.     Comments: Walker in use Able to get up on exam table safely  Skin:    General: Skin is warm and dry.     Capillary Refill: Capillary refill takes less than 2 seconds.  Neurological:     General: No focal deficit present.     Mental Status: She is alert and oriented to person, place, and time.  Psychiatric:        Mood and Affect: Mood normal.        Behavior: Behavior normal.        Thought Content: Thought content normal.        Judgment: Judgment normal.     BP (!) 151/96 (BP Location: Right Arm, Patient Position: Sitting)   Pulse 71   Temp (!) 97.2 F (36.2 C)   Ht _0  (1.727 m)   Wt (!) 364 lb 0.4 oz (165.1 kg)   SpO2 99%   BMI 55.35 kg/m  Wt Readings from Last 3 Encounters:  04/24/21 (!) 364 lb 0.4 oz (165.1 kg)  12/27/20 (!) 372 lb (168.7 kg)  12/11/20 (!) 363 lb (164.7 kg)     Health Maintenance Due  Topic Date Due  . Zoster Vaccines- Shingrix (1 of 2) Never done    There are no preventive care reminders to display for this patient.  Lab Results  Component Value Date   TSH 0.992 09/10/2020   Lab Results  Component Value Date   WBC 10.4 09/02/2020   HGB 11.6 (L) 09/02/2020   HCT 36.3 09/02/2020   MCV 98.1 09/02/2020   PLT 269 09/02/2020   Lab  Results  Component Value Date   NA 141 09/02/2020   K 3.9 09/02/2020   CHLORIDE 97 (L) 08/05/2017   CO2 26 09/02/2020   GLUCOSE 88 09/02/2020   BUN 12 09/02/2020   CREATININE 1.20 (H) 09/02/2020   BILITOT 0.5 09/02/2020   ALKPHOS 77 09/02/2020   AST 19 09/02/2020   ALT 14 09/02/2020   PROT 7.7 09/02/2020   ALBUMIN 3.3 (L) 09/02/2020   CALCIUM 9.4 09/02/2020   ANIONGAP 7 09/02/2020   EGFR 38 (L) 08/05/2017   GFR 68.88 06/24/2018   Lab Results  Component Value Date   CHOL 193 09/10/2020   Lab Results  Component Value Date   HDL 57 09/10/2020   Lab Results  Component Value Date   LDLCALC 124 (H) 09/10/2020   Lab Results  Component Value Date   TRIG 66 09/10/2020   Lab Results  Component Value Date   CHOLHDL 3.4 09/10/2020   Lab Results  Component Value Date   HGBA1C 5.3 09/11/2020      Assessment & Plan:   Problem List Items Addressed This Visit      Cardiovascular and Mediastinum   HTN (hypertension), benign Uncontrolled  Encouraged on going compliance with current medication regimen Encouraged home monitoring and recording BP <130/80 Eating a heart-healthy diet with less salt Encouraged regular physical activity  Recommend Weight loss   Relevant Medications   amLODipine (NORVASC) 10 MG tablet   hydrochlorothiazide (HYDRODIURIL) 25 MG tablet   lisinopril (ZESTRIL) 40 MG tablet   Other Relevant Orders   Comp. 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  Discussed proper diet (low fat, low sodium, high fiber) with patient.   Discussed need for regular exercise (3 times per week, 20 minutes per session) with patient.    Chronic pain of multiple joints   Relevant  Medications   DULoxetine (CYMBALTA) 60 MG capsule   gabapentin (NEURONTIN) 300 MG capsule   tizanidine (ZANAFLEX) 6 MG capsule   Chronic pain syndrome   Relevant Medications   DULoxetine (CYMBALTA) 60 MG capsule   gabapentin (NEURONTIN) 300 MG capsule   tizanidine (ZANAFLEX) 6 MG capsule    Other Visit Diagnoses    Tobacco use disorder, continuous   Discussed the risk factors associated with smoking ; CAD, COPD, Cancer, PVD increased susceptibility to respiratory illnesses Discussed treatment options with cessation ie counseling, support resources and available medications  Choosing a quit day and setting goals accordingly. Discussed ways to quit; start by decreasing one cigarette per day or per week.  Encourage patient to call for assistance once ready to quit. Provided education handouts   Counseling 5-10 minutes    Chronic back pain, unspecified back location, unspecified back pain laterality       Relevant Medications   DULoxetine (CYMBALTA) 60 MG capsule   gabapentin (NEURONTIN) 300 MG capsule   tizanidine (ZANAFLEX) 6 MG capsule   Bilateral chronic knee pain     Persistent working on weight loss to one day undergo replacement surgery   Relevant Medications   DULoxetine (CYMBALTA) 60 MG capsule   gabapentin (NEURONTIN) 300 MG capsule   tizanidine (ZANAFLEX) 6 MG capsule   Screening for cholesterol level       Relevant Orders   Lipid panel (Completed)      Meds ordered this encounter  Medications  . amLODipine (NORVASC) 10 MG tablet    Sig: Take 1 tablet (10 mg total) by mouth daily. Needs office visit for more refills    Dispense:  90 tablet    Refill:  3    Order Specific Question:   Supervising Provider    Answer:   Tresa Garter [id number]  . DULoxetine (CYMBALTA) 60 MG capsule    Sig: TAKE 1 CAPSULE (60 MG TOTAL) BY MOUTH DAILY.    Dispense:  90 capsule    Refill:  3    Order Specific Question:   Supervising Provider    Answer:   Tresa Garter  [id number]  . gabapentin (NEURONTIN) 300 MG capsule    Sig: TAKE 1 CAPSULE (300 MG TOTAL) BY MOUTH AT BEDTIME.    Dispense:  90 capsule    Refill:  3    Order Specific Question:   Supervising Provider    Answer:   Tresa Garter [id number]  . hydrochlorothiazide (HYDRODIURIL) 25 MG tablet    Sig: Take 1 tablet (25 mg total) by mouth daily.    Dispense:  90 tablet    Refill:  3    Order Specific Question:   Supervising Provider    Answer:   Tresa Garter [id number]  . lisinopril (ZESTRIL) 40 MG tablet    Sig: Take 1 tablet (40 mg total) by mouth daily.    Dispense:  90 tablet  Refill:  3    Order Specific Question:   Supervising Provider    Answer:   Tresa Garter [id number]  . tizanidine (ZANAFLEX) 6 MG capsule    Sig: TAKE 1 CAPSULE (6 MG TOTAL) BY MOUTH 3 (THREE) TIMES DAILY AS NEEDED FOR MUSCLE SPASMS.    Dispense:  270 capsule    Refill:  3    Order Specific Question:   Supervising Provider    Answer:   Tresa Garter [[id number]]    Follow-up: Return in about 3 months (around 07/25/2021).    Vevelyn Francois, NP

## 2021-04-25 ENCOUNTER — Ambulatory Visit: Payer: No Typology Code available for payment source | Admitting: Family Medicine

## 2021-04-25 LAB — COMP. METABOLIC PANEL (12)
AST: 11 IU/L (ref 0–40)
Albumin/Globulin Ratio: 1.2 (ref 1.2–2.2)
Albumin: 4.1 g/dL (ref 3.8–4.8)
Alkaline Phosphatase: 82 IU/L (ref 44–121)
BUN/Creatinine Ratio: 13 (ref 9–23)
BUN: 14 mg/dL (ref 6–24)
Bilirubin Total: 0.4 mg/dL (ref 0.0–1.2)
Calcium: 9.2 mg/dL (ref 8.7–10.2)
Chloride: 107 mmol/L — ABNORMAL HIGH (ref 96–106)
Creatinine, Ser: 1.1 mg/dL — ABNORMAL HIGH (ref 0.57–1.00)
Globulin, Total: 3.4 g/dL (ref 1.5–4.5)
Glucose: 89 mg/dL (ref 65–99)
Potassium: 4.5 mmol/L (ref 3.5–5.2)
Sodium: 143 mmol/L (ref 134–144)
Total Protein: 7.5 g/dL (ref 6.0–8.5)
eGFR: 61 mL/min/{1.73_m2} (ref >59)

## 2021-04-25 LAB — LIPID PANEL
Chol/HDL Ratio: 3.7 ratio (ref 0.0–4.4)
Cholesterol, Total: 172 mg/dL (ref 100–199)
HDL: 47 mg/dL (ref >39)
LDL Chol Calc (NIH): 114 mg/dL — ABNORMAL HIGH (ref 0–99)
Triglycerides: 55 mg/dL (ref 0–149)
VLDL Cholesterol Cal: 11 mg/dL (ref 5–40)

## 2021-05-07 ENCOUNTER — Ambulatory Visit
Admission: RE | Admit: 2021-05-07 | Discharge: 2021-05-07 | Disposition: A | Discharge status: Home / Self Care | Payer: Self-pay | Source: Ambulatory Visit | Attending: Orthopaedic Surgery | Admitting: Orthopaedic Surgery

## 2021-05-07 ENCOUNTER — Telehealth: Payer: Self-pay | Admitting: Clinical

## 2021-05-07 ENCOUNTER — Other Ambulatory Visit: Payer: Self-pay

## 2021-05-07 DIAGNOSIS — M5416 Radiculopathy, lumbar region: Secondary | ICD-10-CM

## 2021-05-07 NOTE — Telephone Encounter (Signed)
[facility] Case Management Referral Note  05/07/2021 Name: FIDELA CIESLAK MRN: [id number] DOB: Mar 18, 1971 Geralyn Flash Guerin is a 50 y.o. year old female who sees Vevelyn Francois, NP for primary care. LCSW was consulted to assess patient's needs and assist the patient with Financial Difficulties related to low income and lack of health coverage.  Interpreter: No.   Interpreter Name & Language: none  Assessment: Patient experiencing financial difficulties related to low income and lack of health coverage. Patient is in need of a walker more appropriate for her size.   Intervention: CSW called patient and assessed. Patient has a pending disability claim and if approved will need to apply for Medicaid. She and her husband are unable to afford a walker out of pocket right now, due to patient being unable to work and her husband has limited income. They are applying for assistance with rent from a charity organization. CSW coordinating with provider and clinic manager regarding applying for financial assistance with walker through patient assistance fund.  Review of patient status, including review of consultants reports, relevant laboratory and other test results, and collaboration with appropriate care team members and the patient's provider was performed as part of comprehensive patient evaluation and provision of services.    Estanislado Emms, [facility] [phone]

## 2021-05-21 ENCOUNTER — Telehealth: Payer: Self-pay | Admitting: Clinical

## 2021-05-21 NOTE — Telephone Encounter (Signed)
[facility] General Follow Up Note  05/21/2021 Name: KINLEY DOZIER MRN: [id number] DOB: 01/28/1971 Geralyn Flash Slabach is a 50 y.o. year old female who sees Vevelyn Francois, NP for primary care. LCSW was initially consulted to financial difficulties related to low income and lack of health coverage.  Interpreter: No.   Interpreter Name & Language: none  Assessment: Patient experiencing financial difficulties related to low income and lack of health coverage. Patient is in need of a walker more appropriate for her size.  Ongoing Intervention: Today CSW coordinated with patient, Education officer, environmental, and [place] to arrange for purchase of bariatric walker for patient through the assistance fund.  Review of patient status, including review of consultants reports, relevant laboratory and other test results, and collaboration with appropriate care team members and the patient's provider was performed as part of comprehensive patient evaluation and provision of services.    Estanislado Emms, [facility] [phone]

## 2021-07-25 ENCOUNTER — Ambulatory Visit: Payer: Medicaid Other | Admitting: Nurse Practitioner

## 2021-08-07 ENCOUNTER — Ambulatory Visit: Payer: Medicaid Other | Admitting: Rheumatology

## 2021-08-13 NOTE — Progress Notes (Signed)
Office Visit Note  Patient: Alexandra Powers             Date of Birth: Aug 06, 1971           MRN: [id number]             PCP: Vevelyn Francois, NP Referring: Vevelyn Francois, NP Visit Date: 08/27/2021 Occupation: '@GUAROCC'$ @  Subjective:  Pain in multiple joints   History of Present Illness: Alexandra Powers is a 50 y.o. female with a history of rheumatoid arthritis and osteoarthritis.  She returns today after her last visit in August 2020.  She states she lost her insurance and could not come for follow-up visit.  At that time she was on hydroxychloroquine and was having an inadequate response to Plaquenil.  We discussed methotrexate at the last visit.  She also has history of underlying arthritis in multiple joints.  She has Medicaid now and is wanting to start on the treatment.  She complains of discomfort in her right shoulder bilateral hands, left hip and her knees.  She also has some ongoing lower back pain.  She does not notice much swelling.  She states she has occasional swelling in her left wrist joint.  She feels her knee joints are swollen.  Activities of Daily Living:  Patient reports morning stiffness for 45 minutes.   Patient Reports nocturnal pain.  Difficulty dressing/grooming: Reports Difficulty climbing stairs: Reports Difficulty getting out of chair: Reports Difficulty using hands for taps, buttons, cutlery, and/or writing: Reports  Review of Systems  Constitutional:  Positive for fatigue.  HENT:  Positive for mouth dryness. Negative for mouth sores and nose dryness.   Eyes:  Negative for pain, itching and dryness.  Respiratory:  Negative for shortness of breath and difficulty breathing.   Cardiovascular:  Negative for chest pain and palpitations.  Gastrointestinal:  Negative for blood in stool, constipation and diarrhea.  Endocrine: Negative for increased urination.  Genitourinary:  Positive for involuntary urination. Negative for difficulty urinating.  Musculoskeletal:   Positive for joint pain, joint pain, joint swelling, myalgias, morning stiffness, muscle tenderness and myalgias.  Skin:  Positive for rash. Negative for color change and sensitivity to sunlight.  Allergic/Immunologic: Negative for susceptible to infections.  Neurological:  Positive for dizziness and numbness. Negative for headaches and memory loss.  Hematological:  Negative for bruising/bleeding tendency and swollen glands.  Psychiatric/Behavioral:  Negative for depressed mood, confusion and sleep disturbance. The patient is not nervous/anxious.    PMFS History:  Patient Active Problem List   Diagnosis Date Noted   CKD (chronic kidney disease), stage III ([place]) 09/09/2020   Preventive measure 09/09/2020   CKD (chronic kidney disease), stage II 09/08/2019   Trochanteric bursitis of left hip 09/08/2019   Anxiety and depression 09/01/2019   Chronic pain syndrome 08/08/2019   Chronic myofascial pain 08/08/2019   Bilateral breast cysts 08/01/2019   Cellulitis of left breast 07/14/2019   Other rheumatoid arthritis with rheumatoid factor of left hip ([place]) 07/14/2019   Primary osteoarthritis of both hips 07/29/2018   Primary osteoarthritis of both knees 07/29/2018   Primary osteoarthritis of both feet 07/29/2018   Tonsillitis 05/02/2018   Acute prerenal failure ([place]) 09/03/2017   OSA (obstructive sleep apnea) 12/10/2016   GERD (gastroesophageal reflux disease) 12/04/2016   Low back pain at multiple sites 08/21/2016   Chronic pain of multiple joints 08/13/2016   MGUS (monoclonal gammopathy of unknown significance) 07/02/2016   Vitamin D deficiency 07/02/2016   HTN (hypertension),  benign 11/11/2015   Morbid obesity ([place]) 11/11/2015   Adrenal mass ([place]) 08/30/2015   Ovarian cyst 08/30/2015    Past Medical History:  Diagnosis Date   Arthritis    Gallstones    GERD (gastroesophageal reflux disease)    Hypertension    MGUS (monoclonal gammopathy of unknown significance)    Obesity     Osteoarthritis    PONV (postoperative nausea and vomiting)    Rheumatoid arthritis (HCC)    Sleep apnea    SENT CPAP BACK, NOT WORN IN 7-8 MONTHS    Family History  Problem Relation Age of Onset   Arthritis Mother    High blood pressure Mother    Sleep apnea Mother    High blood pressure Father    Breast cancer Paternal Aunt    Arthritis Sister    Healthy Daughter    Healthy Son    Adrenal disorder Neg Hx    Colon cancer Neg Hx    Esophageal cancer Neg Hx    Pancreatic cancer Neg Hx    Stomach cancer Neg Hx    Liver disease Neg Hx    Past Surgical History:  Procedure Laterality Date   BREAST DUCTAL SYSTEM EXCISION Bilateral 10/12/2017   Procedure: EXCISION OF BILATERAL CENTRAL DUCT;  Surgeon: Erroll Luna, MD;  Location: [facility];  Service: General;  Laterality: Bilateral;   BREAST EXCISIONAL BIOPSY     CHOLECYSTECTOMY N/A 12/21/2015   Procedure: LAPAROSCOPIC CHOLECYSTECTOMY;  Surgeon: Georganna Skeans, MD;  Location: [facility];  Service: General;  Laterality: N/A;   UMBILICAL HERNIA REPAIR N/A 12/27/2015   Procedure: INCARCERATED UMBILICAL HERNIA REPAIR;  Surgeon: Coralie Keens, MD;  Location: [facility];  Service: General;  Laterality: N/A;   Social History   Social History Narrative   Lives with husband, Berneta Sages.   Immunization History  Administered Date(s) Administered   Influenza,inj,Quad PF,6+ Mos 08/29/2015, 08/21/2016, 08/31/2017, 08/09/2018, 08/01/2019, 09/09/2020   Influenza,inj,quad, With Preservative 08/30/2017   PFIZER(Purple Top)SARS-COV-2 Vaccination 02/16/2020, 03/08/2020, 12/07/2020   Tdap 08/29/2015     Objective: Vital Signs: BP 123/89 (BP Location: Right Wrist, Patient Position: Sitting, Cuff Size: Normal)   Pulse 80   Ht '5\' 8"'$  (1.727 m)   Wt (!) 350 lb 3.2 oz (158.8 kg)   BMI 53.25 kg/m    Physical Exam Vitals and nursing note reviewed.  Constitutional:      Appearance: She is well-developed.  HENT:     Head: Normocephalic and atraumatic.  Eyes:      Conjunctiva/sclera: Conjunctivae normal.  Cardiovascular:     Rate and Rhythm: Normal rate and regular rhythm.     Heart sounds: Normal heart sounds.  Pulmonary:     Effort: Pulmonary effort is normal.     Breath sounds: Normal breath sounds.  Abdominal:     General: Bowel sounds are normal.     Palpations: Abdomen is soft.  Musculoskeletal:     Cervical back: Normal range of motion.  Lymphadenopathy:     Cervical: No cervical adenopathy.  Skin:    General: Skin is warm and dry.     Capillary Refill: Capillary refill takes less than 2 seconds.  Neurological:     Mental Status: She is alert and oriented to person, place, and time.  Psychiatric:        Behavior: Behavior normal.     Musculoskeletal Exam: C-spine was in good range of motion.  Thoracic and lumbar spine were difficult to assess due to her limitations.  Shoulder joints,  elbow joints, wrist joints, MCPs PIPs and DIPs with good range of motion with no synovitis.  Hip joints were difficult to assess in the sitting position.  Knee joints were in good range of motion without any warmth swelling or effusion.  She had good range of motion of her ankle joints.  There was no tenderness over ankles or MTPs.  CDAI Exam: CDAI Score: 4.6  Patient Global: 5 mm; Provider Global: 1 mm Swollen: 0 ; Tender: 4  Joint Exam 08/27/2021      Right  Left  Glenohumeral   Tender     Wrist      Tender  Knee   Tender   Tender     Investigation: No additional findings.  Imaging: XR Foot 2 Views Left  Result Date: 08/27/2021 First MTP, PIP and DIP narrowing was noted.  Dorsal spurring was noted.  No tibiotalar or subtalar joint space narrowing was noted.  Posterior calcaneal spur was noted.  No erosive changes were noted. Impression: These findings are consistent with osteoarthritis of the foot.  XR Foot 2 Views Right  Result Date: 08/27/2021 First MTP, PIP and DIP narrowing was noted.  Dorsal spurring was noted.  No tibiotalar or  subtalar joint space narrowing was noted.  Posterior calcaneal spur was noted.  No erosive changes were noted. Impression: These findings are consistent with osteoarthritis of the foot.  XR Hand 2 View Left  Result Date: 08/27/2021 CMC, PIP and DIP narrowing was noted.  No MCP, intercarpal or radiocarpal joint space narrowing was noted.  No erosive changes were noted. Impression: These findings are consistent with osteoarthritis of the hand.  XR Hand 2 View Right  Result Date: 08/27/2021 CMC, mild PIP and DIP narrowing was noted.  No MCP narrowing was noted.  No intercarpal or radiocarpal joint space narrowing was noted.  No erosive changes were noted. Impression: These findings are consistent with early osteoarthritis of the hand.  XR KNEE 3 VIEW LEFT  Result Date: 08/27/2021 Severe medial and moderate lateral compartment narrowing was noted.  Medial, intercondylar and lateral osteophytes were noted.  Severe patellofemoral narrowing was noted. Impression: These findings are consistent with severe osteoarthritis and severe chondromalacia patella.  XR KNEE 3 VIEW RIGHT  Result Date: 08/27/2021 Severe medial compartment narrowing with medial, intercondylar and lateral osteophytes were noted.  Severe patellofemoral narrowing was noted. Impression: These findings are consistent with severe osteoarthritis and severe chondromalacia patella.   Recent Labs: Lab Results  Component Value Date   WBC 10.4 09/02/2020   HGB 11.6 (L) 09/02/2020   PLT 269 09/02/2020   NA 143 04/24/2021   K 4.5 04/24/2021   CL 107 (H) 04/24/2021   CO2 26 09/02/2020   GLUCOSE 89 04/24/2021   BUN 14 04/24/2021   CREATININE 1.10 (H) 04/24/2021   BILITOT 0.4 04/24/2021   ALKPHOS 82 04/24/2021   AST 11 04/24/2021   ALT 14 09/02/2020   PROT 7.5 04/24/2021   ALBUMIN 4.1 04/24/2021   CALCIUM 9.2 04/24/2021   GFRAA >60 09/02/2020   QFTBGOLDPLUS POSITIVE (A) 07/29/2018    Speciality Comments: PLQ Eye Exam: 08/12/18 WNL  @ [facility] Follow up in 1 year.   Procedures:  No procedures performed Allergies: Patient has no known allergies.   Assessment / Plan:     Visit Diagnoses: Rheumatoid factor positive- Positive RF, positive anti-CCP, positive elevated sedimentation rate, positive synovitis on ultrasound  -patient was seen by me last about 2 years ago.  At that  time she was given hydroxychloroquine due to ongoing pain and discomfort in her joints, positive serology and synovitis noted on the ultrasound.  She was also given methotrexate prescription which she took for a month then discontinue that she had no benefit from the medication.  She has been off medications for 2 years.  She continues to have pain and discomfort in multiple joints which she describes in her right shoulder, bilateral hands, left hip and both knees.  No warmth swelling or effusion was noted on my examination today.  I will obtain following labs today.  It is difficult to justify immunosuppressive therapy based on the clinical examination.  Her x-rays obtained today were also unremarkable.  No radiographic progression was noted over the last 2 years.  Plan: Sedimentation rate, Rheumatoid factor, Cyclic citrul peptide antibody, IgG, ANA  High risk medication use - Inadequate response to Plaquenil in the past.  She took methotrexate for 1 month but discontinued due to not noticing any improvement. 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  She will bring the card at the follow-up visit.  Pain in both hands -she complains of discomfort in her bilateral hands.  No synovitis was noted.  Plan: XR Hand 2 View Right, XR Hand 2 View Left.  X-rays are consistent with osteoarthritis.  Primary osteoarthritis of both hips  Primary osteoarthritis of both knees -she continues to have pain  and discomfort in her bilateral knee joints.  No warmth swelling or effusion was noted.  Plan: XR KNEE 3 VIEW RIGHT, XR KNEE 3 VIEW LEFT.  X-rays showed severe end-stage osteoarthritis and severe chondromalacia patella.  Primary osteoarthritis of both feet -he complains of discomfort in her feet.  No tenderness was noted.  Plan: XR Foot 2 Views Right, XR Foot 2 Views Left.  X-rays were consistent with osteoarthritis.  HTN (hypertension), benign-blood pressure was normal today.  Gastroesophageal reflux disease, unspecified whether esophagitis present  MGUS (monoclonal gammopathy of unknown significance)-followed by oncology.  Vitamin D deficiency  OSA (obstructive sleep apnea)  Orders: Orders Placed This Encounter  Procedures   XR Hand 2 View Right   XR Hand 2 View Left   XR KNEE 3 VIEW RIGHT   XR KNEE 3 VIEW LEFT   XR Foot 2 Views Right   XR Foot 2 Views Left   CBC with Differential/Platelet   COMPLETE METABOLIC PANEL WITH GFR   Sedimentation rate   Rheumatoid factor   Cyclic citrul peptide antibody, IgG   ANA    No orders of the defined types were placed in this encounter.    Follow-Up Instructions: Return in about 4 weeks (around 09/24/2021) for Rheumatoid arthritis, Osteoarthritis.   Bo Merino, MD  Note - This record has been created using Editor, commissioning.  Chart creation errors have been sought, but may not always  have been located. Such creation errors do not reflect on  the standard of medical care.

## 2021-08-27 ENCOUNTER — Other Ambulatory Visit: Payer: Self-pay

## 2021-08-27 ENCOUNTER — Ambulatory Visit: Payer: Self-pay

## 2021-08-27 ENCOUNTER — Encounter: Payer: Self-pay | Admitting: Rheumatology

## 2021-08-27 ENCOUNTER — Ambulatory Visit: Payer: Medicaid Other | Admitting: Rheumatology

## 2021-08-27 VITALS — BP 123/89 | HR 80 | Ht 68.0 in | Wt 350.2 lb

## 2021-08-27 DIAGNOSIS — R7612 Nonspecific reaction to cell mediated immunity measurement of gamma interferon antigen response without active tuberculosis: Secondary | ICD-10-CM | POA: Diagnosis not present

## 2021-08-27 DIAGNOSIS — M79642 Pain in left hand: Secondary | ICD-10-CM

## 2021-08-27 DIAGNOSIS — M19072 Primary osteoarthritis, left ankle and foot: Secondary | ICD-10-CM | POA: Diagnosis not present

## 2021-08-27 DIAGNOSIS — M19071 Primary osteoarthritis, right ankle and foot: Secondary | ICD-10-CM

## 2021-08-27 DIAGNOSIS — M79641 Pain in right hand: Secondary | ICD-10-CM | POA: Diagnosis not present

## 2021-08-27 DIAGNOSIS — R768 Other specified abnormal immunological findings in serum: Secondary | ICD-10-CM | POA: Diagnosis not present

## 2021-08-27 DIAGNOSIS — Z79899 Other long term (current) drug therapy: Secondary | ICD-10-CM

## 2021-08-27 DIAGNOSIS — M170 Bilateral primary osteoarthritis of knee: Secondary | ICD-10-CM

## 2021-08-27 DIAGNOSIS — M1711 Unilateral primary osteoarthritis, right knee: Secondary | ICD-10-CM | POA: Diagnosis not present

## 2021-08-27 DIAGNOSIS — M0579 Rheumatoid arthritis with rheumatoid factor of multiple sites without organ or systems involvement: Secondary | ICD-10-CM

## 2021-08-27 DIAGNOSIS — M1712 Unilateral primary osteoarthritis, left knee: Secondary | ICD-10-CM | POA: Diagnosis not present

## 2021-08-27 DIAGNOSIS — M160 Bilateral primary osteoarthritis of hip: Secondary | ICD-10-CM

## 2021-08-27 DIAGNOSIS — K219 Gastro-esophageal reflux disease without esophagitis: Secondary | ICD-10-CM

## 2021-08-27 DIAGNOSIS — I10 Essential (primary) hypertension: Secondary | ICD-10-CM

## 2021-08-27 DIAGNOSIS — E559 Vitamin D deficiency, unspecified: Secondary | ICD-10-CM

## 2021-08-27 DIAGNOSIS — G4733 Obstructive sleep apnea (adult) (pediatric): Secondary | ICD-10-CM

## 2021-08-27 DIAGNOSIS — D472 Monoclonal gammopathy: Secondary | ICD-10-CM

## 2021-08-29 LAB — COMPLETE METABOLIC PANEL WITH GFR
AG Ratio: 0.9 (calc) — ABNORMAL LOW (ref 1.0–2.5)
ALT: 7 U/L (ref 6–29)
AST: 13 U/L (ref 10–35)
Albumin: 3.9 g/dL (ref 3.6–5.1)
Alkaline phosphatase (APISO): 79 U/L (ref 37–153)
BUN/Creatinine Ratio: 19 (calc) (ref 6–22)
BUN: 23 mg/dL (ref 7–25)
CO2: 25 mmol/L (ref 20–32)
Calcium: 9.8 mg/dL (ref 8.6–10.4)
Chloride: 106 mmol/L (ref 98–110)
Creat: 1.2 mg/dL — ABNORMAL HIGH (ref 0.50–1.03)
Globulin: 4.2 g/dL (calc) — ABNORMAL HIGH (ref 1.9–3.7)
Glucose, Bld: 97 mg/dL (ref 65–99)
Potassium: 4.1 mmol/L (ref 3.5–5.3)
Sodium: 143 mmol/L (ref 135–146)
Total Bilirubin: 0.4 mg/dL (ref 0.2–1.2)
Total Protein: 8.1 g/dL (ref 6.1–8.1)
eGFR: 55 mL/min/{1.73_m2} — ABNORMAL LOW (ref >=60)

## 2021-08-29 LAB — CBC WITH DIFFERENTIAL/PLATELET
Absolute Monocytes: 707 cells/uL (ref 200–950)
Basophils Absolute: 68 cells/uL (ref 0–200)
Basophils Relative: 0.5 %
Eosinophils Absolute: 340 cells/uL (ref 15–500)
Eosinophils Relative: 2.5 %
HCT: 42.1 % (ref 35.0–45.0)
Hemoglobin: 14.4 g/dL (ref 11.7–15.5)
Lymphs Abs: 4882 cells/uL — ABNORMAL HIGH (ref 850–3900)
MCH: 33.6 pg — ABNORMAL HIGH (ref 27.0–33.0)
MCHC: 34.2 g/dL (ref 32.0–36.0)
MCV: 98.4 fL (ref 80.0–100.0)
MPV: 10.9 fL (ref 7.5–12.5)
Monocytes Relative: 5.2 %
Neutro Abs: 7602 cells/uL (ref 1500–7800)
Neutrophils Relative %: 55.9 %
Platelets: 367 10*3/uL (ref 140–400)
RBC: 4.28 10*6/uL (ref 3.80–5.10)
RDW: 13 % (ref 11.0–15.0)
Total Lymphocyte: 35.9 %
WBC: 13.6 10*3/uL — ABNORMAL HIGH (ref 3.8–10.8)

## 2021-08-29 LAB — RHEUMATOID FACTOR: Rheumatoid fact SerPl-aCnc: 441 IU/mL — ABNORMAL HIGH (ref <14)

## 2021-08-29 LAB — CYCLIC CITRUL PEPTIDE ANTIBODY, IGG: Cyclic Citrullin Peptide Ab: 89 UNITS — ABNORMAL HIGH

## 2021-08-29 LAB — SEDIMENTATION RATE: Sed Rate: 130 mm/h — ABNORMAL HIGH (ref 0–30)

## 2021-08-29 LAB — ANA: Anti Nuclear Antibody (ANA): NEGATIVE

## 2021-08-29 NOTE — Progress Notes (Signed)
White cell count is elevated.Creatinine is elevated and stable.  Sed rate is high at 130.  Rheumatoid factor is elevated.  Anti-CCP is elevated.  ANA is negative.  These findings are consistent with rheumatoid arthritis.  Please schedule an appointment with Lovena Le as soon as possible to start on treatment.

## 2021-09-02 ENCOUNTER — Inpatient Hospital Stay: Payer: Medicaid Other

## 2021-09-04 ENCOUNTER — Inpatient Hospital Stay: Payer: Medicaid Other | Attending: Hematology and Oncology

## 2021-09-04 ENCOUNTER — Other Ambulatory Visit: Payer: Self-pay

## 2021-09-04 DIAGNOSIS — D472 Monoclonal gammopathy: Secondary | ICD-10-CM | POA: Diagnosis not present

## 2021-09-04 DIAGNOSIS — N183 Chronic kidney disease, stage 3 unspecified: Secondary | ICD-10-CM | POA: Insufficient documentation

## 2021-09-04 LAB — COMPREHENSIVE METABOLIC PANEL
ALT: 6 U/L (ref 0–44)
AST: 11 U/L — ABNORMAL LOW (ref 15–41)
Albumin: 3.2 g/dL — ABNORMAL LOW (ref 3.5–5.0)
Alkaline Phosphatase: 83 U/L (ref 38–126)
Anion gap: 10 (ref 5–15)
BUN: 19 mg/dL (ref 6–20)
CO2: 27 mmol/L (ref 22–32)
Calcium: 9.5 mg/dL (ref 8.9–10.3)
Chloride: 108 mmol/L (ref 98–111)
Creatinine, Ser: 1.19 mg/dL — ABNORMAL HIGH (ref 0.44–1.00)
GFR, Estimated: 55 mL/min — ABNORMAL LOW (ref >60)
Glucose, Bld: 89 mg/dL (ref 70–99)
Potassium: 3.5 mmol/L (ref 3.5–5.1)
Sodium: 145 mmol/L (ref 135–145)
Total Bilirubin: 0.3 mg/dL (ref 0.3–1.2)
Total Protein: 8.2 g/dL — ABNORMAL HIGH (ref 6.5–8.1)

## 2021-09-04 LAB — CBC WITH DIFFERENTIAL/PLATELET
Abs Immature Granulocytes: 0.02 10*3/uL (ref 0.00–0.07)
Basophils Absolute: 0.1 10*3/uL (ref 0.0–0.1)
Basophils Relative: 1 %
Eosinophils Absolute: 0.4 10*3/uL (ref 0.0–0.5)
Eosinophils Relative: 4 %
HCT: 40 % (ref 36.0–46.0)
Hemoglobin: 12.9 g/dL (ref 12.0–15.0)
Immature Granulocytes: 0 %
Lymphocytes Relative: 37 %
Lymphs Abs: 4 10*3/uL (ref 0.7–4.0)
MCH: 32 pg (ref 26.0–34.0)
MCHC: 32.3 g/dL (ref 30.0–36.0)
MCV: 99.3 fL (ref 80.0–100.0)
Monocytes Absolute: 0.6 10*3/uL (ref 0.1–1.0)
Monocytes Relative: 5 %
Neutro Abs: 5.6 10*3/uL (ref 1.7–7.7)
Neutrophils Relative %: 53 %
Platelets: 277 10*3/uL (ref 150–400)
RBC: 4.03 MIL/uL (ref 3.87–5.11)
RDW: 14.5 % (ref 11.5–15.5)
WBC: 10.7 10*3/uL — ABNORMAL HIGH (ref 4.0–10.5)
nRBC: 0 % (ref 0.0–0.2)

## 2021-09-05 LAB — KAPPA/LAMBDA LIGHT CHAINS
Kappa free light chain: 59.6 mg/L — ABNORMAL HIGH (ref 3.3–19.4)
Kappa, lambda light chain ratio: 3.06 — ABNORMAL HIGH (ref 0.26–1.65)
Lambda free light chains: 19.5 mg/L (ref 5.7–26.3)

## 2021-09-05 NOTE — Progress Notes (Signed)
Office Visit Note  Patient: Alexandra Powers             Date of Birth: 20-Dec-1970           MRN: [id number]             PCP: Vevelyn Francois, NP Referring: Vevelyn Francois, NP Visit Date: 09/11/2021 Occupation: @GUAROCC @  Subjective:  Discuss medication options   History of Present Illness: Alexandra Powers is a 50 y.o. female with history of seropositive rheumatoid arthritis and osteoarthritis.  Patient presents today to discuss recent lab work and x-rays obtained on 08/27/2021.  She would also like to discuss treatment options for rheumatoid arthritis.  In the past she had an inadequate response to Plaquenil.  She also took methotrexate for about 1 month but discontinued due to no improvement in her symptoms.  She reports that she continues to have chronic pain in both shoulders, both hands, both knees, and her lower back.  Her pain is most severe in the left knee joint.  She experiences intermittent warmth and swelling in her left knee.  She uses a Rollator walker to assist with ambulation. She denies any recent infections.     Activities of Daily Living:  Patient reports morning stiffness for all day. Patient Reports nocturnal pain.  Difficulty dressing/grooming: Reports Difficulty climbing stairs: Reports Difficulty getting out of chair: Reports Difficulty using hands for taps, buttons, cutlery, and/or writing: Reports  Review of Systems  Constitutional:  Positive for fatigue.  HENT:  Negative for mouth sores, mouth dryness and nose dryness.   Eyes:  Negative for pain, itching and dryness.  Respiratory:  Positive for shortness of breath. Negative for difficulty breathing.   Cardiovascular:  Negative for chest pain and palpitations.  Gastrointestinal:  Negative for blood in stool, constipation and diarrhea.  Endocrine: Negative for increased urination.  Genitourinary:  Positive for involuntary urination. Negative for difficulty urinating.  Musculoskeletal:  Positive for joint pain,  joint pain, joint swelling, myalgias, morning stiffness, muscle tenderness and myalgias.  Skin:  Positive for rash. Negative for color change.  Allergic/Immunologic: Negative for susceptible to infections.  Neurological:  Positive for dizziness, numbness, headaches and weakness. Negative for memory loss.  Hematological:  Positive for bruising/bleeding tendency.   PMFS History:  Patient Active Problem List   Diagnosis Date Noted   CKD (chronic kidney disease), stage III ([place]) 09/09/2020   Preventive measure 09/09/2020   CKD (chronic kidney disease), stage II 09/08/2019   Trochanteric bursitis of left hip 09/08/2019   Anxiety and depression 09/01/2019   Chronic pain syndrome 08/08/2019   Chronic myofascial pain 08/08/2019   Bilateral breast cysts 08/01/2019   Cellulitis of left breast 07/14/2019   Other rheumatoid arthritis with rheumatoid factor of left hip ([place]) 07/14/2019   Primary osteoarthritis of both hips 07/29/2018   Primary osteoarthritis of both knees 07/29/2018   Primary osteoarthritis of both feet 07/29/2018   Tonsillitis 05/02/2018   Acute prerenal failure ([place]) 09/03/2017   OSA (obstructive sleep apnea) 12/10/2016   GERD (gastroesophageal reflux disease) 12/04/2016   Low back pain at multiple sites 08/21/2016   Chronic pain of multiple joints 08/13/2016   MGUS (monoclonal gammopathy of unknown significance) 07/02/2016   Vitamin D deficiency 07/02/2016   HTN (hypertension), benign 11/11/2015   Morbid obesity ([place]) 11/11/2015   Adrenal mass ([place]) 08/30/2015   Ovarian cyst 08/30/2015    Past Medical History:  Diagnosis Date   Arthritis    Gallstones  GERD (gastroesophageal reflux disease)    Hypertension    MGUS (monoclonal gammopathy of unknown significance)    Obesity    Osteoarthritis    PONV (postoperative nausea and vomiting)    Rheumatoid arthritis (HCC)    Sleep apnea    SENT CPAP BACK, NOT WORN IN 7-8 MONTHS    Family History  Problem Relation Age  of Onset   Arthritis Mother    High blood pressure Mother    Sleep apnea Mother    High blood pressure Father    Breast cancer Paternal Aunt    Arthritis Sister    Healthy Daughter    Healthy Son    Adrenal disorder Neg Hx    Colon cancer Neg Hx    Esophageal cancer Neg Hx    Pancreatic cancer Neg Hx    Stomach cancer Neg Hx    Liver disease Neg Hx    Past Surgical History:  Procedure Laterality Date   BREAST DUCTAL SYSTEM EXCISION Bilateral 10/12/2017   Procedure: EXCISION OF BILATERAL CENTRAL DUCT;  Surgeon: Erroll Luna, MD;  Location: [facility];  Service: General;  Laterality: Bilateral;   BREAST EXCISIONAL BIOPSY     CHOLECYSTECTOMY N/A 12/21/2015   Procedure: LAPAROSCOPIC CHOLECYSTECTOMY;  Surgeon: Georganna Skeans, MD;  Location: [facility];  Service: General;  Laterality: N/A;   UMBILICAL HERNIA REPAIR N/A 12/27/2015   Procedure: INCARCERATED UMBILICAL HERNIA REPAIR;  Surgeon: Coralie Keens, MD;  Location: [facility];  Service: General;  Laterality: N/A;   Social History   Social History Narrative   Lives with husband, Berneta Sages.   Immunization History  Administered Date(s) Administered   Influenza,inj,Quad PF,6+ Mos 08/29/2015, 08/21/2016, 08/31/2017, 08/09/2018, 08/01/2019, 09/09/2020   Influenza,inj,quad, With Preservative 08/30/2017   PFIZER(Purple Top)SARS-COV-2 Vaccination 02/16/2020, 03/08/2020, 12/07/2020   Tdap 08/29/2015     Objective: Vital Signs: BP 106/75 (BP Location: Left Arm, Patient Position: Sitting, Cuff Size: Large)   Pulse 88   Ht 5\' 8"  (1.727 m)   Wt (!) 351 lb (159.2 kg)   LMP 04/16/2019   BMI 53.37 kg/m    Physical Exam Vitals and nursing note reviewed.  Constitutional:      Appearance: She is well-developed.  HENT:     Head: Normocephalic and atraumatic.  Eyes:     Conjunctiva/sclera: Conjunctivae normal.  Pulmonary:     Effort: Pulmonary effort is normal.  Abdominal:     Palpations: Abdomen is soft.  Musculoskeletal:     Cervical back:  Normal range of motion.  Skin:    General: Skin is warm and dry.     Capillary Refill: Capillary refill takes less than 2 seconds.  Neurological:     Mental Status: She is alert and oriented to person, place, and time.  Psychiatric:        Behavior: Behavior normal.     Musculoskeletal Exam: Patient remained seated on rollator walker during exam.  C-spine has good ROM with no discomfort.  Midline spinal tenderness in the lumbar region.  Painful ROM of the right shoulder with tenderness.  Elbow joints have good ROM with no tenderness.  Tenderness over both wrists with warmth of the left wrist.  No synovitis over MCP joints.  Pain in both hands when making a complete fist.  Hip joints difficult to assess in seated position.  Painful ROM of both knee joints with crepitus.  Limited extension of the left knee. Warmth of the left knee noted.  Ankle joints have good ROM with no tenderness or  joint swelling.    CDAI Exam: CDAI Score: 7  Patient Global: 5 mm; Provider Global: 5 mm Swollen: 1 ; Tender: 5  Joint Exam 09/11/2021      Right  Left  Glenohumeral   Tender     Wrist   Tender   Tender  Knee   Tender  Swollen Tender     Investigation: No additional findings.  Imaging: XR Foot 2 Views Left  Result Date: 08/27/2021 First MTP, PIP and DIP narrowing was noted.  Dorsal spurring was noted.  No tibiotalar or subtalar joint space narrowing was noted.  Posterior calcaneal spur was noted.  No erosive changes were noted. Impression: These findings are consistent with osteoarthritis of the foot.  XR Foot 2 Views Right  Result Date: 08/27/2021 First MTP, PIP and DIP narrowing was noted.  Dorsal spurring was noted.  No tibiotalar or subtalar joint space narrowing was noted.  Posterior calcaneal spur was noted.  No erosive changes were noted. Impression: These findings are consistent with osteoarthritis of the foot.  XR Hand 2 View Left  Result Date: 08/27/2021 CMC, PIP and DIP narrowing was  noted.  No MCP, intercarpal or radiocarpal joint space narrowing was noted.  No erosive changes were noted. Impression: These findings are consistent with osteoarthritis of the hand.  XR Hand 2 View Right  Result Date: 08/27/2021 CMC, mild PIP and DIP narrowing was noted.  No MCP narrowing was noted.  No intercarpal or radiocarpal joint space narrowing was noted.  No erosive changes were noted. Impression: These findings are consistent with early osteoarthritis of the hand.  XR KNEE 3 VIEW LEFT  Result Date: 08/27/2021 Severe medial and moderate lateral compartment narrowing was noted.  Medial, intercondylar and lateral osteophytes were noted.  Severe patellofemoral narrowing was noted. Impression: These findings are consistent with severe osteoarthritis and severe chondromalacia patella.  XR KNEE 3 VIEW RIGHT  Result Date: 08/27/2021 Severe medial compartment narrowing with medial, intercondylar and lateral osteophytes were noted.  Severe patellofemoral narrowing was noted. Impression: These findings are consistent with severe osteoarthritis and severe chondromalacia patella.   Recent Labs: Lab Results  Component Value Date   WBC 10.7 (H) 09/04/2021   HGB 12.9 09/04/2021   PLT 277 09/04/2021   NA 145 09/04/2021   K 3.5 09/04/2021   CL 108 09/04/2021   CO2 27 09/04/2021   GLUCOSE 89 09/04/2021   BUN 19 09/04/2021   CREATININE 1.19 (H) 09/04/2021   BILITOT 0.3 09/04/2021   ALKPHOS 83 09/04/2021   AST 11 (L) 09/04/2021   ALT 6 09/04/2021   PROT 8.2 (H) 09/04/2021   ALBUMIN 3.2 (L) 09/04/2021   CALCIUM 9.5 09/04/2021   GFRAA >60 09/02/2020   QFTBGOLDPLUS POSITIVE (A) 07/29/2018    Speciality Comments: PLQ Eye Exam: 08/12/18 WNL @ [facility] Follow up in 1 year.   Procedures:  No procedures performed Allergies: Patient has no known allergies.   Assessment / Plan:     Visit Diagnoses: Rheumatoid arthritis involving multiple sites with positive rheumatoid factor (HCC) -  Positive RF, positive anti-CCP, positive elevated sedimentation rate, positive synovitis on ultrasound: She presents today with ongoing pain and stiffness in her right shoulder, both wrists, both hands, and both knee joints.  Her pain has been most severe in the left knee joint.  On examination today she has limited extension, crepitus, and warmth in the left knee.  She is having difficulty ambulating due to the severity of pain in her knee  joints and uses a Rollator walker for assistance.  She had some tenderness over both wrist joints and warmth in the left wrist.  X-rays of her hands and feet were consistent with osteoarthritic changes.  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  Her sed rate was 130 and RF and anti-CCP remain positive.She is not currently taking any immunosuppressive agents.  She previously tried Plaquenil but had an inadequate response.  She took methotrexate for about 1 month but discontinued due to noticing no improvement in her symptoms.  She presented today to discuss treatment options after having updated lab work and x-rays on 08/27/2021.  She is not a good candidate to restart on methotrexate due to elevated creatinine.  Her arthritis is not aggressive enough to warrant Biologics at this time.  The indications, contraindications, potential side effects of Arava were discussed today in detail and all questions were addressed.  Consent was obtained.  She will start on Arava 10 mg 1 tablet daily for 2 weeks and if labs are stable at that time she will increase to 2 tablets daily.  She was advised to notify us if she cannot tolerate taking Arava.  She will follow-up in the office in about 2 months to assess her response.  Medication counseling:   Baseline Immunosuppressant Therapy Labs  Quantiferon TB Gold Latest Ref Rng & Units 07/29/2018  Quantiferon TB Gold Plus NEGATIVE POSITIVE(A)    Hepatitis Latest Ref Rng & Units 07/25/2018  Hep B Surface  Ag NON-REACTI NON-REACTIVE  Hep B IgM NON-REACTI NON-REACTIVE  Hep C Ab NON-REACTI NON-REACTIVE  Hep C Ab NON-REACTI NON-REACTIVE    Lab Results  Component Value Date   HIV NON-REACTIVE 07/25/2018   HIV NONREACTIVE 04/03/2016    Immunoglobulin Electrophoresis Latest Ref Rng & Units 09/04/2021  IgA  47 - 310 mg/dL -  IgG 586 - 1,602 mg/dL 1,654(H)  IgM 26 - 217 mg/dL 106    Serum Protein Electrophoresis Latest Ref Rng & Units 09/04/2021  Total Protein 6.5 - 8.1 g/dL 8.2(H)    Lab Results  Component Value Date   G6PDH 13.5 07/25/2018   Patient was counseled on the purpose, proper use, and adverse effects of leflunomide including risk of infection, nausea/diarrhea/weight loss, increase in blood pressure, rash, hair loss, tingling in the hands and feet, and signs and symptoms of interstitial lung disease.   Also counseled on Black Box warning of liver injury and importance of avoiding alcohol while on therapy. Discussed that there is the possibility of an increased risk of malignancy but it is not well understood if this increased risk is due to the medication or the disease state.  Counseled patient to avoid live vaccines. Recommend annual influenza, Pneumovax 23, Prevnar 13, and Shingrix as indicated.   Discussed the importance of frequent monitoring of liver function and blood count.  Standing orders placed.  Discussed importance of birth control while on leflunomide due to risk of congenital abnormalities, and patient confirms she is postmenopausal.  Provided patient with educational materials on leflunomide and answered all questions.  Patient consented to [place] use, and consent will be uploaded into the media tab.   Patient dose will be 10 mg for 2 weeks and if labs are stable she will increase to 20 mg daily.    High risk medication use -She will be starting on Arava 10 mg daily for 2 weeks and if labs are stable she will increase to 20 mg daily.  Consent was  obtained today.   Inadequate response to Plaquenil in the past.  She took methotrexate for 1 month but discontinued due to not noticing any improvement. CBC and CMP updated on 09/04/21 and results were reviewed today in the office.  She is not a good candidate for MTX due to elevated creatinine.  She will return for lab work in 2 weeks x 2, 2 months, then every 3 months to monitor for drug toxicity.  Standing orders for CBC and CMP will be placed today. She has not had any recent infections.  We discussed the importance of holding [place] if she develops signs or symptoms of an infection and to resume once the infection is completely cleared.  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  She will bring the card at the follow-up visit.  Primary osteoarthritis of both hands: She experiences pain and stiffness in both hands.  X-rays of both hands were updated on 08/27/2021 which were consistent with early osteoarthritic changes.  Primary osteoarthritis of both hips: Difficult to assess range of motion while seated during the examination today.  Primary osteoarthritis of both knees - Severe end-stage OA and severe chondromalacia patella.  X-rays of both knees were reviewed with the patient today in the office.  She continues to have severe pain in both knees especially the left knee.  She has limited extension and warmth in the left knee on examination today.  Crepitus in both knee joints noted.  She is aware that she will require weight loss prior to proceeding with a knee replacement in the future.  She will continues to use a Rollator walker to assist with ambulation.  Primary osteoarthritis of both feet: She is not having any increased discomfort in her feet at this time.  No tenderness over ankle joints noted.  Other medical conditions are listed as follows:   HTN (hypertension), benign: BP was 106/75 today in the office.    Gastroesophageal reflux disease, unspecified whether esophagitis present  MGUS (monoclonal gammopathy of unknown significance): Followed by Dr. Alvy Bimler. Lab work updated on 09/04/21.   Vitamin D deficiency  OSA (obstructive sleep apnea)  Orders: Orders Placed This Encounter  Procedures   CBC with Differential/Platelet   COMPLETE METABOLIC PANEL WITH GFR    Meds ordered this encounter  Medications   leflunomide (ARAVA) 10 MG tablet    Sig: Take 1 tab by mouth once daily for 2 weeks. Then recheck labs. If stable, increase to 2 tabs once daily    Dispense:  60 tablet    Refill:  0       Follow-Up Instructions: Return in about 2 months (around 11/11/2021) for Rheumatoid arthritis, Osteoarthritis.   Ofilia Neas, PA-C  Note - This record has been created using Dragon software.  Chart creation errors have been sought, but may not always  have been located. Such creation errors do not reflect on  the standard of medical care.

## 2021-09-09 ENCOUNTER — Inpatient Hospital Stay: Payer: Medicaid Other | Admitting: Hematology and Oncology

## 2021-09-09 LAB — MULTIPLE MYELOMA PANEL, SERUM
Albumin SerPl Elph-Mcnc: 3.4 g/dL (ref 2.9–4.4)
Albumin/Glob SerPl: 0.9 (ref 0.7–1.7)
Alpha 1: 0.2 g/dL (ref 0.0–0.4)
Alpha2 Glob SerPl Elph-Mcnc: 0.8 g/dL (ref 0.4–1.0)
B-Globulin SerPl Elph-Mcnc: 1.6 g/dL — ABNORMAL HIGH (ref 0.7–1.3)
Gamma Glob SerPl Elph-Mcnc: 1.4 g/dL (ref 0.4–1.8)
Globulin, Total: 4 g/dL — ABNORMAL HIGH (ref 2.2–3.9)
IgA: 664 mg/dL — ABNORMAL HIGH (ref 87–352)
IgG (Immunoglobin G), Serum: 1654 mg/dL — ABNORMAL HIGH (ref 586–1602)
IgM (Immunoglobulin M), Srm: 106 mg/dL (ref 26–217)
M Protein SerPl Elph-Mcnc: 0.2 g/dL — ABNORMAL HIGH
Total Protein ELP: 7.4 g/dL (ref 6.0–8.5)

## 2021-09-11 ENCOUNTER — Ambulatory Visit: Payer: Medicaid Other | Admitting: Physician Assistant

## 2021-09-11 ENCOUNTER — Other Ambulatory Visit: Payer: Self-pay

## 2021-09-11 ENCOUNTER — Encounter: Payer: Self-pay | Admitting: Physician Assistant

## 2021-09-11 VITALS — BP 106/75 | HR 88 | Ht 68.0 in | Wt 351.0 lb

## 2021-09-11 DIAGNOSIS — Z79899 Other long term (current) drug therapy: Secondary | ICD-10-CM

## 2021-09-11 DIAGNOSIS — D472 Monoclonal gammopathy: Secondary | ICD-10-CM

## 2021-09-11 DIAGNOSIS — M19072 Primary osteoarthritis, left ankle and foot: Secondary | ICD-10-CM

## 2021-09-11 DIAGNOSIS — M0579 Rheumatoid arthritis with rheumatoid factor of multiple sites without organ or systems involvement: Secondary | ICD-10-CM

## 2021-09-11 DIAGNOSIS — G4733 Obstructive sleep apnea (adult) (pediatric): Secondary | ICD-10-CM

## 2021-09-11 DIAGNOSIS — M19041 Primary osteoarthritis, right hand: Secondary | ICD-10-CM

## 2021-09-11 DIAGNOSIS — M19071 Primary osteoarthritis, right ankle and foot: Secondary | ICD-10-CM

## 2021-09-11 DIAGNOSIS — R7612 Nonspecific reaction to cell mediated immunity measurement of gamma interferon antigen response without active tuberculosis: Secondary | ICD-10-CM

## 2021-09-11 DIAGNOSIS — I10 Essential (primary) hypertension: Secondary | ICD-10-CM

## 2021-09-11 DIAGNOSIS — M160 Bilateral primary osteoarthritis of hip: Secondary | ICD-10-CM

## 2021-09-11 DIAGNOSIS — M170 Bilateral primary osteoarthritis of knee: Secondary | ICD-10-CM

## 2021-09-11 DIAGNOSIS — K219 Gastro-esophageal reflux disease without esophagitis: Secondary | ICD-10-CM

## 2021-09-11 DIAGNOSIS — M19042 Primary osteoarthritis, left hand: Secondary | ICD-10-CM

## 2021-09-11 DIAGNOSIS — R768 Other specified abnormal immunological findings in serum: Secondary | ICD-10-CM

## 2021-09-11 DIAGNOSIS — E559 Vitamin D deficiency, unspecified: Secondary | ICD-10-CM

## 2021-09-11 MED ORDER — LEFLUNOMIDE 10 MG PO TABS: ORAL_TABLET | ORAL | 0 refills | Status: DC

## 2021-09-11 NOTE — Patient Instructions (Addendum)
Standing Labs We placed an order today for your standing lab work.   Please have your standing labs drawn in 2 weeks x2, 2 months, and 3 months  If possible, please have your labs drawn 2 weeks prior to your appointment so that the provider can discuss your results at your appointment.  Please note that you may see your imaging and lab results in [facility] before we have reviewed them. We may be awaiting multiple results to interpret others before contacting you. Please allow our office up to 72 hours to thoroughly review all of the results before contacting the office for clarification of your results.  We have open lab daily: Monday through Thursday from 1:30-4:30 PM and Friday from 1:30-4:00 PM at the office of Dr. Bo Merino, [facility].   Please be advised, all patients with office appointments requiring lab work will take precedent over walk-in lab work.  If possible, please come for your lab work on Monday and Friday afternoons, as you may experience shorter wait times. The office is located at [address], [place], [place], [place] [address] No appointment is necessary.   Labs are drawn by [facility]. Please bring your co-pay at the time of your lab draw.  You may receive a bill from [facility] for your lab work.  If you wish to have your labs drawn at another location, please call the office 24 hours in advance to send orders.  If you have any questions regarding directions or hours of operation,  please call [phone].   As a reminder, please drink plenty of water prior to coming for your lab work. Thanks!  Leflunomide tablets What is this medication? LEFLUNOMIDE (le FLOO na mide) is for rheumatoid arthritis. This medicine may be used for other purposes; ask your health care provider or pharmacist if you have questions. COMMON BRAND NAME(S): Arava What should I tell my care team before I take this medication? They need to know if you have any of these  conditions: diabetes have a fever or infection high blood pressure immune system problems kidney disease liver disease low blood cell counts, like low white cell, platelet, or red cell counts lung or breathing disease, like asthma recently received or scheduled to receive a vaccine receiving treatment for cancer skin conditions or sensitivity tingling of the fingers or toes, or other nerve disorder tuberculosis an unusual or allergic reaction to leflunomide, teriflunomide, other medicines, food, dyes, or preservatives pregnant or trying to get pregnant breast-feeding How should I use this medication? Take this medicine by mouth with a full glass of water. Follow the directions on the prescription label. Take your medicine at regular intervals. Do not take your medicine more often than directed. Do not stop taking except on your doctor's advice. Talk to your pediatrician regarding the use of this medicine in children. Special care may be needed. Overdosage: If you think you have taken too much of this medicine contact a poison control center or emergency room at once. NOTE: This medicine is only for you. Do not share this medicine with others. What if I miss a dose? If you miss a dose, take it as soon as you can. If it is almost time for your next dose, take only that dose. Do not take double or extra doses. What may interact with this medication? Do not take this medicine with any of the following medications: teriflunomide This medicine may also interact with the following medications: alosetron birth control pills caffeine cefaclor certain  medicines for diabetes like nateglinide, repaglinide, rosiglitazone, pioglitazone certain medicines for high cholesterol like atorvastatin, pravastatin, rosuvastatin, simvastatin charcoal cholestyramine ciprofloxacin duloxetine furosemide ketoprofen live virus vaccines medicines that increase your risk for  infection methotrexate mitoxantrone paclitaxel penicillin theophylline tizanidine warfarin This list may not describe all possible interactions. Give your health care provider a list of all the medicines, herbs, non-prescription drugs, or dietary supplements you use. Also tell them if you smoke, drink alcohol, or use illegal drugs. Some items may interact with your medicine. What should I watch for while using this medication? Visit your health care provider for regular checks on your progress. Tell your doctor or health care provider if your symptoms do not start to get better or if they get worse. You may need blood work done while you are taking this medicine. This medicine may cause serious skin reactions. They can happen weeks to months after starting the medicine. Contact your health care provider right away if you notice fevers or flu-like symptoms with a rash. The rash may be red or purple and then turn into blisters or peeling of the skin. Or, you might notice a red rash with swelling of the face, lips or lymph nodes in your neck or under your arms. This medicine may stay in your body for up to 2 years after your last dose. Tell your doctor about any unusual side effects or symptoms. A medicine can be given to help lower your blood levels of this medicine more quickly. Women must use effective birth control with this medicine. There is a potential for serious side effects to an unborn child. Do not become pregnant while taking this medicine. Inform your doctor if you wish to become pregnant. This medicine remains in your blood after you stop taking it. You must continue using effective birth control until the blood levels have been checked and they are low enough. A medicine can be given to help lower your blood levels of this medicine more quickly. Immediately talk to your doctor if you think you may be pregnant. You may need a pregnancy test. Talk to your health care provider or pharmacist  for more information. You should not receive certain vaccines during your treatment and for a certain time after your treatment with this medication ends. Talk to your health care provider for more information. What side effects may I notice from receiving this medication? Side effects that you should report to your doctor or health care professional as soon as possible: allergic reactions like skin rash, itching or hives, swelling of the face, lips, or tongue breathing problems cough increased blood pressure low blood counts - this medicine may decrease the number of white blood cells and platelets. You may be at increased risk for infections and bleeding. pain, tingling, numbness in the hands or feet rash, fever, and swollen lymph nodes redness, blistering, peeing or loosening of the skin, including inside the mouth signs of decreased platelets or bleeding - bruising, pinpoint red spots on the skin, black, tarry stools, blood in urine signs of infection - fever or chills, cough, sore throat, pain or trouble passing urine signs and symptoms of liver injury like dark yellow or brown urine; general ill feeling or flu-like symptoms; light-colored stools; loss of appetite; nausea; right upper belly pain; unusually weak or tired; yellowing of the eyes or skin trouble passing urine or change in the amount of urine vomiting Side effects that usually do not require medical attention (report to your doctor or health  care professional if they continue or are bothersome): diarrhea hair thinning or loss headache nausea tiredness This list may not describe all possible side effects. Call your doctor for medical advice about side effects. You may report side effects to FDA at 1-800-FDA-1088. Where should I keep my medication? Keep out of the reach of children. Store at room temperature between 15 and 30 degrees C (59 and 86 degrees F). Protect from moisture and light. Throw away any unused medicine  after the expiration date. NOTE: This sheet is a summary. It may not cover all possible information. If you have questions about this medicine, talk to your doctor, pharmacist, or health care provider.  2022 Elsevier/Gold Standard (2019-02-17 15:06:48)

## 2021-09-11 NOTE — Progress Notes (Signed)
Pharmacy Note  Subjective: Patient presents today to the [facility] for follow up office visit.  Patient seen by the pharmacist for counseling on leflunomide Alexandra Powers) for rheumatoid arthritis.  Objective: CBC    Component Value Date/Time   WBC 10.7 (H) 09/04/2021 1049   RBC 4.03 09/04/2021 1049   HGB 12.9 09/04/2021 1049   HGB 12.9 04/20/2019 1016   HGB 12.8 08/05/2017 1057   HCT 40.0 09/04/2021 1049   HCT 39.8 04/20/2019 1016   HCT 39.2 08/05/2017 1057   PLT 277 09/04/2021 1049   PLT 320 04/20/2019 1016   MCV 99.3 09/04/2021 1049   MCV 103 (H) 04/20/2019 1016   MCV 99.7 08/05/2017 1057   MCH 32.0 09/04/2021 1049   MCHC 32.3 09/04/2021 1049   RDW 14.5 09/04/2021 1049   RDW 12.2 04/20/2019 1016   RDW 15.0 (H) 08/05/2017 1057   LYMPHSABS 4.0 09/04/2021 1049   LYMPHSABS 4.7 (H) 04/20/2019 1016   LYMPHSABS 3.8 (H) 08/05/2017 1057   MONOABS 0.6 09/04/2021 1049   MONOABS 0.7 08/05/2017 1057   EOSABS 0.4 09/04/2021 1049   EOSABS 0.3 04/20/2019 1016   BASOSABS 0.1 09/04/2021 1049   BASOSABS 0.1 04/20/2019 1016   BASOSABS 0.0 08/05/2017 1057    CMP     Component Value Date/Time   NA 145 09/04/2021 1049   NA 143 04/24/2021 1223   NA 142 08/05/2017 1057   K 3.5 09/04/2021 1049   K 2.7 (LL) 08/05/2017 1057   CL 108 09/04/2021 1049   CO2 27 09/04/2021 1049   CO2 33 (H) 08/05/2017 1057   GLUCOSE 89 09/04/2021 1049   GLUCOSE 103 08/05/2017 1057   BUN 19 09/04/2021 1049   BUN 14 04/24/2021 1223   BUN 31.2 (H) 08/05/2017 1057   CREATININE 1.19 (H) 09/04/2021 1049   CREATININE 1.20 (H) 08/27/2021 1116   CREATININE 1.8 (H) 08/05/2017 1057   CALCIUM 9.5 09/04/2021 1049   CALCIUM 10.4 08/05/2017 1057   PROT 8.2 (H) 09/04/2021 1049   PROT 7.5 04/24/2021 1223   PROT 8.3 08/05/2017 1057   ALBUMIN 3.2 (L) 09/04/2021 1049   ALBUMIN 4.1 04/24/2021 1223   ALBUMIN 3.2 (L) 08/05/2017 1057   AST 11 (L) 09/04/2021 1049   AST 18 08/05/2017 1057   ALT 6 09/04/2021 1049    ALT 10 08/05/2017 1057   ALKPHOS 83 09/04/2021 1049   ALKPHOS 62 08/05/2017 1057   BILITOT 0.3 09/04/2021 1049   BILITOT 0.4 04/24/2021 1223   BILITOT 0.46 08/05/2017 1057   GFRNONAA 55 (L) 09/04/2021 1049   GFRNONAA 58 (L) 07/06/2019 1010   GFRAA >60 09/02/2020 1051   GFRAA 68 07/06/2019 1010    Baseline Immunosuppressant Therapy Labs Quantiferon TB Gold Latest Ref Rng & Units 07/29/2018  Quantiferon TB Gold Plus NEGATIVE POSITIVE(A)    Hepatitis Latest Ref Rng & Units 07/25/2018  Hep B Surface Ag NON-REACTI NON-REACTIVE  Hep B IgM NON-REACTI NON-REACTIVE  Hep C Ab NON-REACTI NON-REACTIVE  Hep C Ab NON-REACTI NON-REACTIVE    Lab Results  Component Value Date   HIV NON-REACTIVE 07/25/2018   HIV NONREACTIVE 04/03/2016    Immunoglobulin Electrophoresis Latest Ref Rng & Units 09/04/2021  IgA  47 - 310 mg/dL -  IgG 586 - 1,602 mg/dL 1,654(H)  IgM 26 - 217 mg/dL 106    Serum Protein Electrophoresis Latest Ref Rng & Units 09/04/2021  Total Protein 6.5 - 8.1 g/dL 8.2(H)    Lab Results  Component Value Date  G6PDH 13.5 07/25/2018    No results found for: TPMT   Pregnancy status:  post-menopausal  Assessment/Plan:  Patient was counseled on the purpose, proper use, and adverse effects of leflunomide including risk of infection, nausea/diarrhea/weight loss, increase in blood pressure, rash, hair loss, tingling in the hands and feet, and signs and symptoms of interstitial lung disease.   Also counseled on Black Box warning of liver injury and importance of avoiding alcohol while on therapy. Discussed that there is the possibility of an increased risk of malignancy but it is not well understood if this increased risk is due to the medication or the disease state.  Counseled patient to avoid live vaccines. Recommend annual influenza, Pneumovax 23, Prevnar 13, and Shingrix as indicated.   Discussed the importance of frequent monitoring of liver function and blood count.   Standing orders placed.  Discussed importance of birth control while on leflunomide due to risk of congenital abnormalities, and patient confirms she is post-menopausal.  Provided patient with educational materials on leflunomide and answered all questions.  Patient consented to [place] use, and consent will be uploaded into the media tab.    Patient dose will be leflunomide 10mg  once daily x 2 weeks. Will recheck labs in 2 weeks. If stable will increase dose to 20mg  once daily and recheck labs again in 2 weeks.  Knox Saliva, PharmD, MPH, BCPS Clinical Pharmacist (Rheumatology and Pulmonology)

## 2021-09-18 ENCOUNTER — Encounter: Payer: Self-pay | Admitting: Hematology and Oncology

## 2021-09-18 ENCOUNTER — Other Ambulatory Visit: Payer: Self-pay

## 2021-09-18 ENCOUNTER — Inpatient Hospital Stay (HOSPITAL_BASED_OUTPATIENT_CLINIC_OR_DEPARTMENT_OTHER): Payer: Medicaid Other | Admitting: Hematology and Oncology

## 2021-09-18 DIAGNOSIS — N183 Chronic kidney disease, stage 3 unspecified: Secondary | ICD-10-CM

## 2021-09-18 DIAGNOSIS — D472 Monoclonal gammopathy: Secondary | ICD-10-CM | POA: Diagnosis not present

## 2021-09-18 NOTE — Assessment & Plan Note (Signed)
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 I discussed with her the natural history of MGUS. Recent blood work showed no significant signs of disease progression She would need close follow-up once a year with history, blood work and examination. She agreed 

## 2021-09-18 NOTE — Assessment & Plan Note (Signed)
She has multiple risk factors for chronic kidney disease We discussed the importance of her taking her medications, hydration and risk factor modification

## 2021-09-18 NOTE — Progress Notes (Signed)
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 I discussed with her the natural history of MGUS. Recent blood work showed no significant signs of disease progression She would need close follow-up once a year with history, blood work and examination. She agreed  CKD (chronic kidney disease), stage III ([place]) She has multiple risk factors for chronic kidney disease We discussed the importance of her taking her medications, hydration and risk factor modification  No orders of the defined types were placed in this encounter.   All questions were answered. The patient knows to call the clinic with any problems, questions or concerns. The total time spent in the appointment was 20 minutes encounter with patients including review of chart and various tests results, discussions about plan of care and coordination of care plan   Heath Lark, MD 09/18/2021 4:31 PM  INTERVAL HISTORY: Please see below for problem oriented charting. she returns for surveillance follow-up for MGUS Since last time I saw her, she is doing well Denies recent infection No new bone pain  REVIEW OF SYSTEMS:   Constitutional: Denies fevers, chills or abnormal weight loss Eyes: Denies blurriness of vision Ears, nose, mouth, throat, and face: Denies mucositis or sore throat Respiratory: Denies cough, dyspnea or wheezes Cardiovascular: Denies palpitation, chest discomfort or lower extremity swelling Gastrointestinal:  Denies nausea, heartburn or change in bowel habits Skin: Denies abnormal skin rashes Lymphatics: Denies new lymphadenopathy or easy bruising Neurological:Denies numbness, tingling or new weaknesses Behavioral/Psych: Mood is stable, no  new changes  All other systems were reviewed with the patient and are negative.  I have reviewed the past medical history, past surgical history, social history and family history with the patient and they are unchanged from previous note.  ALLERGIES:  has No Known Allergies.  MEDICATIONS:  Current Outpatient Medications  Medication Sig Dispense Refill   amLODipine (NORVASC) 10 MG tablet Take 1 tablet (10 mg total) by mouth daily. Needs office visit for more refills 90 tablet 3   baclofen (LIORESAL) 10 MG tablet Take 10 mg by mouth 3 (three) times daily as needed.     DULoxetine (CYMBALTA) 60 MG capsule TAKE 1 CAPSULE (60 MG TOTAL) BY MOUTH DAILY. 90 capsule 3   gabapentin (NEURONTIN) 300 MG capsule TAKE 1 CAPSULE (300 MG TOTAL) BY MOUTH AT BEDTIME. (Patient not taking: No sig reported) 90 capsule 3   hydrochlorothiazide (HYDRODIURIL) 25 MG tablet Take 1 tablet (25 mg total) by mouth daily. 90 tablet 3   leflunomide (ARAVA) 10 MG tablet Take 1 tab by mouth once daily for 2 weeks. Then recheck labs. If stable, increase to 2 tabs once daily 60 tablet 0   lisinopril (ZESTRIL) 40 MG tablet Take 1 tablet (40 mg total) by mouth daily. 90 tablet 3   methocarbamol (ROBAXIN-750) 750 MG tablet Take 1 tablet (750 mg total) by mouth 3 (three) times daily. (Patient not taking: No sig reported) 270 tablet 1   pregabalin (LYRICA) 75 MG capsule Take 75 mg by mouth 2 (two) times daily.     tizanidine (ZANAFLEX) 6 MG capsule TAKE 1 CAPSULE (6 MG TOTAL) BY MOUTH 3 (THREE) TIMES DAILY AS NEEDED FOR MUSCLE SPASMS. (Patient not taking: No sig reported) 270 capsule 3   traMADol (ULTRAM) 50  MG tablet Take by mouth as needed.     Vitamin D, Ergocalciferol, (DRISDOL) 1.25 MG (50000 UNIT) CAPS capsule Take 1 capsule (50,000 Units total) by mouth every 7 (seven) days. 5 capsule 5   No current facility-administered medications for this visit.    SUMMARY OF ONCOLOGIC HISTORY:  Virgin Zellers is here because of chronic  joint pain and abnormal protein electrophoresis. The patient had problems with chronic back pain. She denies prior history of trauma. The pain has been present for over a year. She was prescribed intermittent doses of NSAID and Vicodin for this. The pains is located in the shoulder, the back, and the knees with associated morning stiffness. The patient had cholecystectomy several months ago and since then had postprandial nausea, vomiting usually within 30 minutes and associated diarrhea 4-5 times a day with abdominal cramps. She have lost almost 40 pounds over the past few months. She was subsequently referred to rheumatologist. On 05/20/2016, blood work show high sedimentation rate of 67, C-reactive protein of 11.9, abnormal serum protein electrophoresis with detectable M spike but negative autoimmune screen with ANA. Hepatitis screen was negative. She denies history of abnormal bone fracture. Patient denies history of recurrent infection or atypical infections such as shingles of meningitis. Denies chills, night sweats or anorexia  In August 2017, she underwent extensive workup which excluded multiple myeloma. X-ray is most consistent with osteoarthritis and she have IgG lambda MGUS with polyclonal IgA  PHYSICAL EXAMINATION: ECOG PERFORMANCE STATUS: 1 - Symptomatic but completely ambulatory  Vitals:   09/18/21 0942  BP: 138/87  Pulse: 70  Resp: 18  Temp: 97.7 F (36.5 C)  SpO2: 100%   Filed Weights   09/18/21 0942  Weight: (!) 351 lb 11.2 oz (159.5 kg)    GENERAL:alert, no distress and comfortable SKIN: skin color, texture, turgor are normal, no rashes or significant lesions EYES: normal, Conjunctiva are pink and non-injected, sclera clear OROPHARYNX:no exudate, no erythema and lips, buccal mucosa, and tongue normal  NECK: supple, thyroid normal size, non-tender, without nodularity LYMPH:  no palpable lymphadenopathy in the cervical, axillary or inguinal LUNGS: clear to  auscultation and percussion with normal breathing effort HEART: regular rate & rhythm and no murmurs and no lower extremity edema ABDOMEN:abdomen soft, non-tender and normal bowel sounds Musculoskeletal:no cyanosis of digits and no clubbing  NEURO: alert & oriented x 3 with fluent speech, no focal motor/sensory deficits  LABORATORY DATA:  I have reviewed the data as listed    Component Value Date/Time   NA 145 09/04/2021 1049   NA 143 04/24/2021 1223   NA 142 08/05/2017 1057   K 3.5 09/04/2021 1049   K 2.7 (LL) 08/05/2017 1057   CL 108 09/04/2021 1049   CO2 27 09/04/2021 1049   CO2 33 (H) 08/05/2017 1057   GLUCOSE 89 09/04/2021 1049   GLUCOSE 103 08/05/2017 1057   BUN 19 09/04/2021 1049   BUN 14 04/24/2021 1223   BUN 31.2 (H) 08/05/2017 1057   CREATININE 1.19 (H) 09/04/2021 1049   CREATININE 1.20 (H) 08/27/2021 1116   CREATININE 1.8 (H) 08/05/2017 1057   CALCIUM 9.5 09/04/2021 1049   CALCIUM 10.4 08/05/2017 1057   PROT 8.2 (H) 09/04/2021 1049   PROT 7.5 04/24/2021 1223   PROT 8.3 08/05/2017 1057   ALBUMIN 3.2 (L) 09/04/2021 1049   ALBUMIN 4.1 04/24/2021 1223   ALBUMIN 3.2 (L) 08/05/2017 1057   AST 11 (L) 09/04/2021 1049   AST 18 08/05/2017 1057   ALT 6  09/04/2021 1049   ALT 10 08/05/2017 1057   ALKPHOS 83 09/04/2021 1049   ALKPHOS 62 08/05/2017 1057   BILITOT 0.3 09/04/2021 1049   BILITOT 0.4 04/24/2021 1223   BILITOT 0.46 08/05/2017 1057   GFRNONAA 55 (L) 09/04/2021 1049   GFRNONAA 58 (L) 07/06/2019 1010   GFRAA >60 09/02/2020 1051   GFRAA 68 07/06/2019 1010    No results found for: SPEP, UPEP  Lab Results  Component Value Date   WBC 10.7 (H) 09/04/2021   NEUTROABS 5.6 09/04/2021   HGB 12.9 09/04/2021   HCT 40.0 09/04/2021   MCV 99.3 09/04/2021   PLT 277 09/04/2021      Chemistry      Component Value Date/Time   NA 145 09/04/2021 1049   NA 143 04/24/2021 1223   NA 142 08/05/2017 1057   K 3.5 09/04/2021 1049   K 2.7 (LL) 08/05/2017 1057   CL 108  09/04/2021 1049   CO2 27 09/04/2021 1049   CO2 33 (H) 08/05/2017 1057   BUN 19 09/04/2021 1049   BUN 14 04/24/2021 1223   BUN 31.2 (H) 08/05/2017 1057   CREATININE 1.19 (H) 09/04/2021 1049   CREATININE 1.20 (H) 08/27/2021 1116   CREATININE 1.8 (H) 08/05/2017 1057      Component Value Date/Time   CALCIUM 9.5 09/04/2021 1049   CALCIUM 10.4 08/05/2017 1057   ALKPHOS 83 09/04/2021 1049   ALKPHOS 62 08/05/2017 1057   AST 11 (L) 09/04/2021 1049   AST 18 08/05/2017 1057   ALT 6 09/04/2021 1049   ALT 10 08/05/2017 1057   BILITOT 0.3 09/04/2021 1049   BILITOT 0.4 04/24/2021 1223   BILITOT 0.46 08/05/2017 1057

## 2021-09-24 ENCOUNTER — Ambulatory Visit: Payer: Medicaid Other | Admitting: Physician Assistant

## 2021-10-02 ENCOUNTER — Other Ambulatory Visit: Payer: Self-pay

## 2021-10-02 ENCOUNTER — Telehealth: Payer: Self-pay | Admitting: Rheumatology

## 2021-10-02 DIAGNOSIS — Z79899 Other long term (current) drug therapy: Secondary | ICD-10-CM

## 2021-10-02 NOTE — Telephone Encounter (Signed)
Patient going to [facility] for lab draw tomorrow. Please release orders.

## 2021-10-02 NOTE — Telephone Encounter (Signed)
Lab orders have been released for [facility]. I called patient and advised.

## 2021-10-04 LAB — CBC WITH DIFFERENTIAL/PLATELET
Absolute Monocytes: 562 cells/uL (ref 200–950)
Basophils Absolute: 73 cells/uL (ref 0–200)
Basophils Relative: 0.7 %
Eosinophils Absolute: 364 cells/uL (ref 15–500)
Eosinophils Relative: 3.5 %
HCT: 42.1 % (ref 35.0–45.0)
Hemoglobin: 13.8 g/dL (ref 11.7–15.5)
Lymphs Abs: 4233 cells/uL — ABNORMAL HIGH (ref 850–3900)
MCH: 32.2 pg (ref 27.0–33.0)
MCHC: 32.8 g/dL (ref 32.0–36.0)
MCV: 98.4 fL (ref 80.0–100.0)
MPV: 10.6 fL (ref 7.5–12.5)
Monocytes Relative: 5.4 %
Neutro Abs: 5169 cells/uL (ref 1500–7800)
Neutrophils Relative %: 49.7 %
Platelets: 288 10*3/uL (ref 140–400)
RBC: 4.28 10*6/uL (ref 3.80–5.10)
RDW: 13.3 % (ref 11.0–15.0)
Total Lymphocyte: 40.7 %
WBC: 10.4 10*3/uL (ref 3.8–10.8)

## 2021-10-04 LAB — COMPLETE METABOLIC PANEL WITH GFR
AG Ratio: 0.9 (calc) — ABNORMAL LOW (ref 1.0–2.5)
ALT: 9 U/L (ref 6–29)
AST: 18 U/L (ref 10–35)
Albumin: 3.7 g/dL (ref 3.6–5.1)
Alkaline phosphatase (APISO): 78 U/L (ref 37–153)
BUN/Creatinine Ratio: 14 (calc) (ref 6–22)
BUN: 16 mg/dL (ref 7–25)
CO2: 24 mmol/L (ref 20–32)
Calcium: 9.5 mg/dL (ref 8.6–10.4)
Chloride: 106 mmol/L (ref 98–110)
Creat: 1.13 mg/dL — ABNORMAL HIGH (ref 0.50–1.03)
Globulin: 4.1 g/dL (calc) — ABNORMAL HIGH (ref 1.9–3.7)
Glucose, Bld: 91 mg/dL (ref 65–139)
Potassium: 4.1 mmol/L (ref 3.5–5.3)
Sodium: 142 mmol/L (ref 135–146)
Total Bilirubin: 0.4 mg/dL (ref 0.2–1.2)
Total Protein: 7.8 g/dL (ref 6.1–8.1)
eGFR: 59 mL/min/{1.73_m2} — ABNORMAL LOW (ref >=60)

## 2021-10-05 NOTE — Progress Notes (Signed)
Creatinine remains slightly elevated bur is trending down.  eGFR is borderline low-59 but impoving.  Globulin is slightly elevated but stable.   Absolute lymphocyte count is borderline elevated.  WBC count is WNL.  Rest of CBC WNL.  We will continue to monitor lab work closely.

## 2021-10-06 ENCOUNTER — Other Ambulatory Visit: Payer: Self-pay | Admitting: *Deleted

## 2021-10-06 DIAGNOSIS — M0579 Rheumatoid arthritis with rheumatoid factor of multiple sites without organ or systems involvement: Secondary | ICD-10-CM

## 2021-10-06 MED ORDER — LEFLUNOMIDE 10 MG PO TABS: ORAL_TABLET | ORAL | 0 refills | Status: DC

## 2021-10-06 NOTE — Telephone Encounter (Signed)
Next Visit: 11/13/2021  Last Visit: 09/11/2021  Last Fill: 09/11/2021  DX:  Rheumatoid arthritis involving multiple sites with positive rheumatoid factor   Current Dose per office note 09/11/2021:  Arava 10 mg 1 tablet daily for 2 weeks and if labs are stable at that time she will increase to 2 tablets daily  Labs: 10/03/2021, Creatinine remains slightly elevated bur is trending down.  eGFR is borderline low-59 but impoving.  Globulin is slightly elevated but stable.   Absolute lymphocyte count is borderline elevated.  WBC count is WNL.  Rest of CBC WNL.  We will continue to monitor lab work closely.   Okay to refill Arava?

## 2021-10-30 NOTE — Progress Notes (Signed)
Office Visit Note  Patient: Alexandra Powers             Date of Birth: 24-Jul-1971           MRN: [id number]             PCP: Vevelyn Francois, NP Referring: Vevelyn Francois, NP Visit Date: 11/13/2021 Occupation: @GUAROCC @  Subjective:  Medication monitoring   History of Present Illness: Alexandra Powers is a 50 y.o. female with history of seropositive rheumatoid arthritis and osteoarthritis.  She is taking arava 20 mg 1 tablet by mouth daily. She was started on arava after her last visit on 09/11/21.  She took Arava 10 mg daily for 2 weeks and tolerated it without any side effects.  According to the patient her insurance would not approve the 20 mg of Petros so she discontinued.  She is willing to be started on Arava.  She continues to have chronic pain in both hands, both knees, and her lower back.  She has been noticing intermittent inflammation in her wrists and both knees.  She has not been taking any over-the-counter products since they have not alleviated her symptoms in the past. She denies any recent infections.  She denies any new medical conditions.     Activities of Daily Living:  Patient reports morning stiffness for 45 minutes.   Patient Reports nocturnal pain.  Difficulty dressing/grooming: Reports Difficulty climbing stairs: Reports Difficulty getting out of chair: Reports Difficulty using hands for taps, buttons, cutlery, and/or writing: Reports  Review of Systems  Constitutional:  Positive for fatigue.  HENT:  Negative for mouth sores, mouth dryness and nose dryness.   Eyes:  Negative for pain, itching and dryness.  Respiratory:  Negative for shortness of breath and difficulty breathing.   Cardiovascular:  Negative for chest pain and palpitations.  Gastrointestinal:  Negative for blood in stool, constipation and diarrhea.  Endocrine: Negative for increased urination.  Genitourinary:  Negative for difficulty urinating.  Musculoskeletal:  Positive for joint pain, joint  pain, joint swelling, myalgias, morning stiffness, muscle tenderness and myalgias.  Skin:  Negative for color change, rash and redness.  Allergic/Immunologic: Negative for susceptible to infections.  Neurological:  Positive for numbness. Negative for dizziness, headaches, memory loss and weakness.  Hematological:  Negative for bruising/bleeding tendency.  Psychiatric/Behavioral:  Negative for confusion.    PMFS History:  Patient Active Problem List   Diagnosis Date Noted   CKD (chronic kidney disease), stage III ([place]) 09/09/2020   Preventive measure 09/09/2020   CKD (chronic kidney disease), stage II 09/08/2019   Trochanteric bursitis of left hip 09/08/2019   Anxiety and depression 09/01/2019   Chronic pain syndrome 08/08/2019   Chronic myofascial pain 08/08/2019   Bilateral breast cysts 08/01/2019   Cellulitis of left breast 07/14/2019   Other rheumatoid arthritis with rheumatoid factor of left hip ([place]) 07/14/2019   Primary osteoarthritis of both hips 07/29/2018   Primary osteoarthritis of both knees 07/29/2018   Primary osteoarthritis of both feet 07/29/2018   Tonsillitis 05/02/2018   Acute prerenal failure ([place]) 09/03/2017   OSA (obstructive sleep apnea) 12/10/2016   GERD (gastroesophageal reflux disease) 12/04/2016   Low back pain at multiple sites 08/21/2016   Chronic pain of multiple joints 08/13/2016   MGUS (monoclonal gammopathy of unknown significance) 07/02/2016   Vitamin D deficiency 07/02/2016   HTN (hypertension), benign 11/11/2015   Morbid obesity ([place]) 11/11/2015   Adrenal mass ([place]) 08/30/2015   Ovarian cyst 08/30/2015  Past Medical History:  Diagnosis Date   Arthritis    Gallstones    GERD (gastroesophageal reflux disease)    Hypertension    MGUS (monoclonal gammopathy of unknown significance)    Obesity    Osteoarthritis    PONV (postoperative nausea and vomiting)    Rheumatoid arthritis (HCC)    Sleep apnea    SENT CPAP BACK, NOT WORN IN 7-8  MONTHS    Family History  Problem Relation Age of Onset   Arthritis Mother    High blood pressure Mother    Sleep apnea Mother    High blood pressure Father    Breast cancer Paternal Aunt    Arthritis Sister    Healthy Daughter    Healthy Son    Adrenal disorder Neg Hx    Colon cancer Neg Hx    Esophageal cancer Neg Hx    Pancreatic cancer Neg Hx    Stomach cancer Neg Hx    Liver disease Neg Hx    Past Surgical History:  Procedure Laterality Date   BREAST DUCTAL SYSTEM EXCISION Bilateral 10/12/2017   Procedure: EXCISION OF BILATERAL CENTRAL DUCT;  Surgeon: Erroll Luna, MD;  Location: [place] [facility];  Service: General;  Laterality: Bilateral;   BREAST EXCISIONAL BIOPSY     CHOLECYSTECTOMY N/A 12/21/2015   Procedure: LAPAROSCOPIC CHOLECYSTECTOMY;  Surgeon: Georganna Skeans, MD;  Location: [facility];  Service: General;  Laterality: N/A;   UMBILICAL HERNIA REPAIR N/A 12/27/2015   Procedure: INCARCERATED UMBILICAL HERNIA REPAIR;  Surgeon: Coralie Keens, MD;  Location: [facility];  Service: General;  Laterality: N/A;   Social History   Social History Narrative   Lives with husband, Berneta Sages.   Immunization History  Administered Date(s) Administered   Influenza,inj,Quad PF,6+ Mos 08/29/2015, 08/21/2016, 08/31/2017, 08/09/2018, 08/01/2019, 09/09/2020   Influenza,inj,quad, With Preservative 08/30/2017   PFIZER(Purple Top)SARS-COV-2 Vaccination 02/16/2020, 03/08/2020, 12/07/2020   Tdap 08/29/2015     Objective: Vital Signs: BP (!) 128/92 (BP Location: Right Wrist, Patient Position: Sitting, Cuff Size: Normal)   Pulse 93   Ht 5\' 8"  (1.727 m)   Wt (!) 354 lb (160.6 kg)   LMP 04/16/2019   BMI 53.83 kg/m    Physical Exam Vitals and nursing note reviewed.  Constitutional:      Appearance: She is well-developed.  HENT:     Head: Normocephalic and atraumatic.  Eyes:     Conjunctiva/sclera: Conjunctivae normal.  Pulmonary:     Effort: Pulmonary effort is normal.  Abdominal:     Palpations:  Abdomen is soft.  Musculoskeletal:     Cervical back: Normal range of motion.  Skin:    General: Skin is warm and dry.     Capillary Refill: Capillary refill takes less than 2 seconds.  Neurological:     Mental Status: She is alert and oriented to person, place, and time.  Psychiatric:        Behavior: Behavior normal.     Musculoskeletal Exam: Patient remained seated during examination.  C-spine has good ROM.  Postural thoracic kyphosis. Shoulder joint abduction to about 90 degrees.  Elbow joints have good ROM with  tenderness bilaterally.  Tenderness over both wrist joints.  No tenderness over MCP or PIP joints.  Complete fist formation bilaterally.  Hip joints difficult to assess in seated position.  Painful ROM both knee joints with warmth in the left knee.  Ankle joints have good ROM with no tenderness or joint swelling.  Pedal edema noted bilaterally.   CDAI Exam: CDAI  Score: 9.4  Patient Global: 7 mm; Provider Global: 7 mm Swollen: 0 ; Tender: 8  Joint Exam 11/13/2021      Right  Left  Glenohumeral   Tender   Tender  Elbow   Tender   Tender  Wrist   Tender   Tender  Knee   Tender   Tender     Investigation: No additional findings.  Imaging: No results found.  Recent Labs: Lab Results  Component Value Date   WBC 10.4 10/03/2021   HGB 13.8 10/03/2021   PLT 288 10/03/2021   NA 142 10/03/2021   K 4.1 10/03/2021   CL 106 10/03/2021   CO2 24 10/03/2021   GLUCOSE 91 10/03/2021   BUN 16 10/03/2021   CREATININE 1.13 (H) 10/03/2021   BILITOT 0.4 10/03/2021   ALKPHOS 83 09/04/2021   AST 18 10/03/2021   ALT 9 10/03/2021   PROT 7.8 10/03/2021   ALBUMIN 3.2 (L) 09/04/2021   CALCIUM 9.5 10/03/2021   GFRAA >60 09/02/2020   QFTBGOLDPLUS POSITIVE (A) 07/29/2018    Speciality Comments: PLQ Eye Exam: 08/12/18 WNL @ [facility] Follow up in 1 year.   Procedures:  No procedures performed Allergies: Patient has no known allergies.   Assessment / Plan:     Visit  Diagnoses: Rheumatoid arthritis involving multiple sites with positive rheumatoid factor (HCC) - Positive RF, positive anti-CCP, positive elevated sedimentation rate, positive synovitis on ultrasound: She presents today with ongoing pain, stiffness, and intermittent inflammation involving multiple joints.  Her discomfort and intermittent inflammation has been most severe in both wrists and both knees.  She has not been taking any OTC products for pain relief. She was started on arava 10 mg daily after her last office visit on 09/11/21 which she took for 2 weeks.  She tolerated low dose arava without any side effects or new lab abnormalities.  She did not increase the dose of arava due to issues with the pharmacy/insurance.  She is willing to restart arava 10 mg 2 tablets daily.  A new prescription was sent to the pharmacy.  She will return to recheck lab work in 2 weeks to monitor for drug toxicity.  She will follow up in the office in 6-8 weeks to assess her response to full dose arava.   - Plan: leflunomide (ARAVA) 10 MG tablet  High risk medication use - Arava 20 mg daily. Inadequate response to Plaquenil in the past. D/c MTX-after 1 month-due to no improvement.  She is not a good candidate for MTX due to elevated creatinine.   CBC and CMP updated on 10/03/21.  She will require updated lab work in 2 weeks, 2 months then every 3 months while on arava.  Standing orders for CBC and CMP are in place.  She has not had any recent infections.  Discussed the importance of holding arava if she develops signs or symptoms of an infection and to resume once the infection has completely cleared.   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   Primary osteoarthritis of both hands - X-rays of both hands were updated on 08/27/2021 which were consistent with early osteoarthritic changes.  She continues to experience intermittent  pain and stiffness in both hands.  She was able to make a complete fist bilaterally.  Discussed the importance of joint protection and muscle strengthening.   Primary osteoarthritis of both hips: Difficult to assess range of  motion while in the seated position.  She experiences occasional discomfort in the left hip.  Primary osteoarthritis of both knees - Severe end-stage OA and severe chondromalacia patella.  She has chronic pain in both knee joints.  Painful range of motion of both knees noted on examination today.  Warmth in the left knee noted.  No effusion noted.  She uses a Rollator walker to assist with ambulation.  Primary osteoarthritis of both feet: She is not experiencing any increased discomfort in her feet at this time.  Other medical conditions are listed as follows:   HTN (hypertension), benign: BP was 128/92 today in the office.   Vitamin D deficiency  MGUS (monoclonal gammopathy of unknown significance) - Followed by Dr. Alvy Bimler.   Gastroesophageal reflux disease, unspecified whether esophagitis present  OSA (obstructive sleep apnea)  Encounter to establish care - Patient requested a referral to establish care with a PCP in [place].  Plan: Ambulatory referral to Family Practice  Orders: Orders Placed This Encounter  Procedures   Ambulatory referral to [facility]    Meds ordered this encounter  Medications   leflunomide (ARAVA) 10 MG tablet    Sig: Take 2 tabs by mouth once daily.    Dispense:  180 tablet    Refill:  0      Follow-Up Instructions: Return in 8 weeks (on 01/08/2022) for Rheumatoid arthritis, Osteoarthritis.   Ofilia Neas, PA-C  Note - This record has been created using Dragon software.  Chart creation errors have been sought, but may not always  have been located. Such creation errors do not reflect on  the standard of medical care.

## 2021-11-13 ENCOUNTER — Other Ambulatory Visit: Payer: Self-pay

## 2021-11-13 ENCOUNTER — Encounter: Payer: Self-pay | Admitting: Physician Assistant

## 2021-11-13 ENCOUNTER — Ambulatory Visit (INDEPENDENT_AMBULATORY_CARE_PROVIDER_SITE_OTHER): Payer: Medicaid Other | Admitting: Physician Assistant

## 2021-11-13 VITALS — BP 128/92 | HR 93 | Ht 68.0 in | Wt 354.0 lb

## 2021-11-13 DIAGNOSIS — M160 Bilateral primary osteoarthritis of hip: Secondary | ICD-10-CM

## 2021-11-13 DIAGNOSIS — M0579 Rheumatoid arthritis with rheumatoid factor of multiple sites without organ or systems involvement: Secondary | ICD-10-CM | POA: Diagnosis not present

## 2021-11-13 DIAGNOSIS — M19041 Primary osteoarthritis, right hand: Secondary | ICD-10-CM | POA: Diagnosis not present

## 2021-11-13 DIAGNOSIS — M19042 Primary osteoarthritis, left hand: Secondary | ICD-10-CM

## 2021-11-13 DIAGNOSIS — R7612 Nonspecific reaction to cell mediated immunity measurement of gamma interferon antigen response without active tuberculosis: Secondary | ICD-10-CM

## 2021-11-13 DIAGNOSIS — I10 Essential (primary) hypertension: Secondary | ICD-10-CM

## 2021-11-13 DIAGNOSIS — M19071 Primary osteoarthritis, right ankle and foot: Secondary | ICD-10-CM

## 2021-11-13 DIAGNOSIS — K219 Gastro-esophageal reflux disease without esophagitis: Secondary | ICD-10-CM

## 2021-11-13 DIAGNOSIS — M19072 Primary osteoarthritis, left ankle and foot: Secondary | ICD-10-CM

## 2021-11-13 DIAGNOSIS — E559 Vitamin D deficiency, unspecified: Secondary | ICD-10-CM

## 2021-11-13 DIAGNOSIS — M170 Bilateral primary osteoarthritis of knee: Secondary | ICD-10-CM

## 2021-11-13 DIAGNOSIS — Z79899 Other long term (current) drug therapy: Secondary | ICD-10-CM | POA: Diagnosis not present

## 2021-11-13 DIAGNOSIS — Z7689 Persons encountering health services in other specified circumstances: Secondary | ICD-10-CM

## 2021-11-13 DIAGNOSIS — D472 Monoclonal gammopathy: Secondary | ICD-10-CM

## 2021-11-13 DIAGNOSIS — G4733 Obstructive sleep apnea (adult) (pediatric): Secondary | ICD-10-CM

## 2021-11-13 MED ORDER — LEFLUNOMIDE 10 MG PO TABS: ORAL_TABLET | ORAL | 0 refills | Status: DC

## 2021-12-02 ENCOUNTER — Other Ambulatory Visit: Payer: Self-pay | Admitting: Rheumatology

## 2021-12-02 MED ORDER — LEFLUNOMIDE 20 MG PO TABS: 20.0000 mg | ORAL_TABLET | Freq: Every day | ORAL | 0 refills | Status: DC

## 2021-12-02 NOTE — Telephone Encounter (Signed)
Patient left voicemail stating that she is still not on medication and wants to start medication. Patient did not specify what medication or what for.

## 2021-12-02 NOTE — Telephone Encounter (Signed)
Attempted to contact the patient and left message for patient to call the office.  

## 2021-12-02 NOTE — Telephone Encounter (Signed)
Patient states she has not restarted the [place] due to the prescription not being covered by her insurance. Reached out to the [facility] to look into what is going on. Pharmacist states patient's insurance will only cover one tablet per day without a prior authorization.   Okay to switch prescription to Arava 20 mg tablet one po daily?

## 2021-12-26 NOTE — Progress Notes (Signed)
Office Visit Note  Patient: Alexandra Powers             Date of Birth: [birth year]           MRN: [id number]             PCP: Vevelyn Francois, NP Referring: Vevelyn Francois, NP Visit Date: 01/08/2022 Occupation: @GUAROCC @  Subjective:  Other (Patient reports rash between breast and also patient report she has been vomiting bile for the past 2 weeks)   History of Present Illness: Alexandra Powers is a 51 y.o. female with a history of seropositive rheumatoid arthritis.  She states for the last few days she has been having pain and discomfort in her left hip which she describes over the trochanteric area.  She states this radiates down into her knee.  None of the other joints are painful.  There is no history of joint swelling.  She states her arthritis is well controlled on leflunomide.  She states 2 weeks ago she started vomiting and she has not stopped vomiting yet.  She has been vomiting 3-4 times a day.  She states she has noticed mucus and yellow vomitus but not blood.  She feels full after she eats and she starts vomiting.  She has been drinking water.  She has developed a rash between her breasts about 3 weeks ago.  She denies pruritus.  Activities of Daily Living:  Patient reports morning stiffness for 45 minutes.   Patient Reports nocturnal pain.  Difficulty dressing/grooming: Reports Difficulty climbing stairs: Reports Difficulty getting out of chair: Reports Difficulty using hands for taps, buttons, cutlery, and/or writing: Reports  Review of Systems  Constitutional:  Positive for fatigue.  HENT:  Negative for mouth sores, mouth dryness and nose dryness.   Eyes:  Negative for pain, itching and dryness.  Respiratory:  Negative for shortness of breath and difficulty breathing.   Cardiovascular:  Negative for chest pain and palpitations.  Gastrointestinal:  Positive for heartburn, nausea and vomiting. Negative for blood in stool, constipation and diarrhea.  Endocrine: Negative for  increased urination.  Genitourinary:  Negative for difficulty urinating.  Musculoskeletal:  Positive for joint pain, joint pain, myalgias, morning stiffness, muscle tenderness and myalgias. Negative for joint swelling.  Skin:  Positive for rash. Negative for color change and sensitivity to sunlight.  Allergic/Immunologic: Negative for susceptible to infections.  Neurological:  Positive for headaches. Negative for dizziness, numbness, memory loss and weakness.  Hematological:  Positive for bruising/bleeding tendency.  Psychiatric/Behavioral:  Negative for depressed mood, confusion and sleep disturbance. The patient is not nervous/anxious.    PMFS History:  Patient Active Problem List   Diagnosis Date Noted   CKD (chronic kidney disease), stage III ([place]) 09/09/2020   Preventive measure 09/09/2020   CKD (chronic kidney disease), stage II 09/08/2019   Trochanteric bursitis of left hip 09/08/2019   Anxiety and depression 09/01/2019   Chronic pain syndrome 08/08/2019   Chronic myofascial pain 08/08/2019   Bilateral breast cysts 08/01/2019   Cellulitis of left breast 07/14/2019   Other rheumatoid arthritis with rheumatoid factor of left hip ([place]) 07/14/2019   Primary osteoarthritis of both hips 07/29/2018   Primary osteoarthritis of both knees 07/29/2018   Primary osteoarthritis of both feet 07/29/2018   Tonsillitis 05/02/2018   Acute prerenal failure ([place]) 09/03/2017   OSA (obstructive sleep apnea) 12/10/2016   GERD (gastroesophageal reflux disease) 12/04/2016   Low back pain at multiple sites 08/21/2016   Chronic pain of  multiple joints 08/13/2016   MGUS (monoclonal gammopathy of unknown significance) 07/02/2016   Vitamin D deficiency 07/02/2016   HTN (hypertension), benign 11/11/2015   Morbid obesity ([place]) 11/11/2015   Adrenal mass ([place]) 08/30/2015   Ovarian cyst 08/30/2015    Past Medical History:  Diagnosis Date   Arthritis    Gallstones    GERD (gastroesophageal reflux  disease)    Hypertension    MGUS (monoclonal gammopathy of unknown significance)    Obesity    Osteoarthritis    PONV (postoperative nausea and vomiting)    Rheumatoid arthritis (HCC)    Sleep apnea    SENT CPAP BACK, NOT WORN IN 7-8 MONTHS    Family History  Problem Relation Age of Onset   Arthritis Mother    High blood pressure Mother    Sleep apnea Mother    High blood pressure Father    Breast cancer Paternal Aunt    Arthritis Sister    Healthy Daughter    Healthy Son    Adrenal disorder Neg Hx    Colon cancer Neg Hx    Esophageal cancer Neg Hx    Pancreatic cancer Neg Hx    Stomach cancer Neg Hx    Liver disease Neg Hx    Past Surgical History:  Procedure Laterality Date   BREAST DUCTAL SYSTEM EXCISION Bilateral 10/12/2017   Procedure: EXCISION OF BILATERAL CENTRAL DUCT;  Surgeon: Erroll Luna, MD;  Location: [place];  Service: General;  Laterality: Bilateral;   BREAST EXCISIONAL BIOPSY     CHOLECYSTECTOMY N/A 12/21/2015   Procedure: LAPAROSCOPIC CHOLECYSTECTOMY;  Surgeon: Georganna Skeans, MD;  Location: [place];  Service: General;  Laterality: N/A;   UMBILICAL HERNIA REPAIR N/A 12/27/2015   Procedure: INCARCERATED UMBILICAL HERNIA REPAIR;  Surgeon: Coralie Keens, MD;  Location: [place];  Service: General;  Laterality: N/A;   Social History   Social History Narrative   Lives with husband, Berneta Sages.   Immunization History  Administered Date(s) Administered   Influenza,inj,Quad PF,6+ Mos 08/29/2015, 08/21/2016, 08/31/2017, 08/09/2018, 08/01/2019, 09/09/2020   Influenza,inj,quad, With Preservative 08/30/2017   PFIZER(Purple Top)SARS-COV-2 Vaccination 02/16/2020, 03/08/2020, 12/07/2020   Tdap 08/29/2015     Objective: Vital Signs: BP 117/83 (BP Location: Right Arm, Patient Position: Sitting, Cuff Size: Large)    Pulse (!) 107    Ht 5\' 8"  (1.727 m)    Wt (!) 338 lb (153.3 kg)    LMP 04/16/2019    BMI 51.39 kg/m    Physical Exam Vitals and nursing note reviewed.   Constitutional:      Appearance: She is well-developed.  HENT:     Head: Normocephalic and atraumatic.  Eyes:     Conjunctiva/sclera: Conjunctivae normal.  Cardiovascular:     Rate and Rhythm: Normal rate and regular rhythm.     Heart sounds: Normal heart sounds.  Pulmonary:     Effort: Pulmonary effort is normal.     Breath sounds: Normal breath sounds.  Abdominal:     General: Bowel sounds are normal.     Palpations: Abdomen is soft.  Musculoskeletal:     Cervical back: Normal range of motion.  Lymphadenopathy:     Cervical: No cervical adenopathy.  Skin:    General: Skin is warm and dry.     Capillary Refill: Capillary refill takes less than 2 seconds.     Comments: Circular macular erythema was noted between her breasts.  Neurological:     Mental Status: She is alert and oriented to person, place,  and time.  Psychiatric:        Behavior: Behavior normal.     Musculoskeletal Exam: C-spine was in good range of motion.  Shoulder joints, elbow joints, wrist joints, MCPs PIPs and DIPs with good range of motion with no synovitis.  She had tenderness on palpation over left trochanteric bursa.  Knee joints with good range of motion without any warmth swelling or effusion.  There was no tenderness over ankles or MTPs.  CDAI Exam: CDAI Score: 0.8  Patient Global: 4 mm; Provider Global: 4 mm Swollen: 0 ; Tender: 0  Joint Exam 01/08/2022   No joint exam has been documented for this visit   There is currently no information documented on the homunculus. Go to the Rheumatology activity and complete the homunculus joint exam.  Investigation: No additional findings.  Imaging: No results found.  Recent Labs: Lab Results  Component Value Date   WBC 10.4 10/03/2021   HGB 13.8 10/03/2021   PLT 288 10/03/2021   NA 142 10/03/2021   K 4.1 10/03/2021   CL 106 10/03/2021   CO2 24 10/03/2021   GLUCOSE 91 10/03/2021   BUN 16 10/03/2021   CREATININE 1.13 (H) 10/03/2021   BILITOT  0.4 10/03/2021   ALKPHOS 83 09/04/2021   AST 18 10/03/2021   ALT 9 10/03/2021   PROT 7.8 10/03/2021   ALBUMIN 3.2 (L) 09/04/2021   CALCIUM 9.5 10/03/2021   GFRAA >60 09/02/2020   QFTBGOLDPLUS POSITIVE (A) 07/29/2018    Speciality Comments: PLQ Eye Exam: 08/12/18 WNL @ [facility] Follow up in 1 year.   Procedures:  No procedures performed Allergies: Patient has no known allergies.   Assessment / Plan:     Visit Diagnoses: Rheumatoid arthritis involving multiple sites with positive rheumatoid factor (HCC) - Positive RF, positive anti-CCP, positive elevated sedimentation rate, positive synovitis on ultrasound: She had no synovitis on examination.  She is clinically doing well.  She stopped Arava 2 weeks ago due to vomiting.  High risk medication use - Arava 20 mg daily. Inadequate response to Plaquenil in the past. D/c MTX-after 1 month-due to no improvement.  She is not a good candidate for MTX due to elevated creatinine.  Her labs from October 03, 2021 showed normal CBC and CMP showed elevated creatinine which has been stable.  She is holding her liver due to recent vomiting.  She has been advised to resume Aleve after the vomiting resolves.  Information regarding immunization was placed in the AVS.  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   Primary osteoarthritis of both hands-she had mild osteoarthritis in her hands.  No synovitis was noted.  Primary osteoarthritis of both hips-she has chronic discomfort in her hip joints.  Trochanteric bursitis, left hip-she had tenderness over left trochanteric area.  IT band stretches were given.  Primary osteoarthritis of both knees-chronic pain.  She uses a walker for mobility.  Primary osteoarthritis of both feet-proper fitting shoes were discussed.  Rash-she had erythematous rash between her breasts, most likely candidiasis.  She will be seen  at the urgent care today.  Nausea and vomiting-patient states she has been experiencing nausea and vomiting for the last 2 weeks.  She has been having 2-3 episodes of vomiting.  She is unable to hold food down.  She has been drinking plenty of fluids.  I advised her to go to urgent care.  Patient plans to go  there directly after the office visit.  HTN (hypertension), benign-blood pressure was normal today.  Vitamin D deficiency  MGUS (monoclonal gammopathy of unknown significance)  Gastroesophageal reflux disease, unspecified whether esophagitis present  OSA (obstructive sleep apnea)  Orders: No orders of the defined types were placed in this encounter.  No orders of the defined types were placed in this encounter.   Follow-Up Instructions: Return in about 5 months (around 06/07/2022) for Rheumatoid arthritis, Osteoarthritis.   Bo Merino, MD  Note - This record has been created using Editor, commissioning.  Chart creation errors have been sought, but may not always  have been located. Such creation errors do not reflect on  the standard of medical care.

## 2022-01-08 ENCOUNTER — Other Ambulatory Visit: Payer: Self-pay

## 2022-01-08 ENCOUNTER — Encounter: Payer: Self-pay | Admitting: Rheumatology

## 2022-01-08 ENCOUNTER — Ambulatory Visit (HOSPITAL_COMMUNITY)
Admission: EM | Admit: 2022-01-08 | Discharge: 2022-01-08 | Discharge status: Against Medical Advice | Payer: Medicaid Other

## 2022-01-08 ENCOUNTER — Ambulatory Visit (INDEPENDENT_AMBULATORY_CARE_PROVIDER_SITE_OTHER): Payer: Medicaid Other | Admitting: Rheumatology

## 2022-01-08 VITALS — BP 117/83 | HR 107 | Ht 68.0 in | Wt 338.0 lb

## 2022-01-08 DIAGNOSIS — M7062 Trochanteric bursitis, left hip: Secondary | ICD-10-CM

## 2022-01-08 DIAGNOSIS — M170 Bilateral primary osteoarthritis of knee: Secondary | ICD-10-CM

## 2022-01-08 DIAGNOSIS — K219 Gastro-esophageal reflux disease without esophagitis: Secondary | ICD-10-CM

## 2022-01-08 DIAGNOSIS — R112 Nausea with vomiting, unspecified: Secondary | ICD-10-CM

## 2022-01-08 DIAGNOSIS — Z79899 Other long term (current) drug therapy: Secondary | ICD-10-CM

## 2022-01-08 DIAGNOSIS — M19041 Primary osteoarthritis, right hand: Secondary | ICD-10-CM | POA: Diagnosis not present

## 2022-01-08 DIAGNOSIS — D472 Monoclonal gammopathy: Secondary | ICD-10-CM

## 2022-01-08 DIAGNOSIS — M0579 Rheumatoid arthritis with rheumatoid factor of multiple sites without organ or systems involvement: Secondary | ICD-10-CM

## 2022-01-08 DIAGNOSIS — M160 Bilateral primary osteoarthritis of hip: Secondary | ICD-10-CM

## 2022-01-08 DIAGNOSIS — R7612 Nonspecific reaction to cell mediated immunity measurement of gamma interferon antigen response without active tuberculosis: Secondary | ICD-10-CM | POA: Diagnosis not present

## 2022-01-08 DIAGNOSIS — I10 Essential (primary) hypertension: Secondary | ICD-10-CM

## 2022-01-08 DIAGNOSIS — E559 Vitamin D deficiency, unspecified: Secondary | ICD-10-CM

## 2022-01-08 DIAGNOSIS — M19042 Primary osteoarthritis, left hand: Secondary | ICD-10-CM

## 2022-01-08 DIAGNOSIS — R21 Rash and other nonspecific skin eruption: Secondary | ICD-10-CM

## 2022-01-08 DIAGNOSIS — M19071 Primary osteoarthritis, right ankle and foot: Secondary | ICD-10-CM

## 2022-01-08 DIAGNOSIS — M19072 Primary osteoarthritis, left ankle and foot: Secondary | ICD-10-CM

## 2022-01-08 DIAGNOSIS — G4733 Obstructive sleep apnea (adult) (pediatric): Secondary | ICD-10-CM

## 2022-01-08 NOTE — Patient Instructions (Addendum)
Standing Labs We placed an order today for your standing lab work.   Please have your standing labs drawn in May and every 3 months  If possible, please have your labs drawn 2 weeks prior to your appointment so that the provider can discuss your results at your appointment.  Please note that you may see your imaging and lab results in [place] before we have reviewed them. We may be awaiting multiple results to interpret others before contacting you. Please allow our office up to 72 hours to thoroughly review all of the results before contacting the office for clarification of your results.  We have open lab daily: Monday through Thursday from 1:30-4:30 PM and Friday from 1:30-4:00 PM at the office of Dr. Bo Merino, [facility].   Please be advised, all patients with office appointments requiring lab work will take precedent over walk-in lab work.  If possible, please come for your lab work on Monday and Friday afternoons, as you may experience shorter wait times. The office is located at [address] [place], [place], [place] [address] No appointment is necessary.   Labs are drawn by [facility]. Please bring your co-pay at the time of your lab draw.  You may receive a bill from [facility] for your lab work.  Please note if you are on Hydroxychloroquine and and an order has been placed for a Hydroxychloroquine level, you will need to have it drawn 4 hours or more after your last dose.  If you wish to have your labs drawn at another location, please call the office 24 hours in advance to send orders.  If you have any questions regarding directions or hours of operation,  please call [phone].   As a reminder, please drink plenty of water prior to coming for your lab work. Thanks!   Vaccines You are taking a medication(s) that can suppress your immune system.  The following immunizations are recommended: Flu annually Covid-19  Td/Tdap (tetanus, diphtheria, pertussis)  every 10 years Pneumonia (Prevnar 15 then Pneumovax 23 at least 1 year apart.  Alternatively, can take Prevnar 20 without needing additional dose) Shingrix: 2 doses from 4 weeks to 6 months apart  Please check with your PCP to make sure you are up to date.   If you have signs or symptoms of an infection or start antibiotics: First, call your PCP for workup of your infection. Hold your medication through the infection, until you complete your antibiotics, and until symptoms resolve if you take the following: Injectable medication (Actemra, Benlysta, Cimzia, Cosentyx, Enbrel, Humira, Kevzara, Orencia, Remicade, Simponi, Stelara, Taltz, Tremfya) Methotrexate Leflunomide (Arava) Mycophenolate (Cellcept) Roma Kayser, or Rinvoq   Iliotibial Band Syndrome Rehab Ask your health care provider which exercises are safe for you. Do exercises exactly as told by your health care provider and adjust them as directed. It is normal to feel mild stretching, pulling, tightness, or discomfort as you do these exercises. Stop right away if you feel sudden pain or your pain gets significantly worse. Do not begin these exercises until told by your health care provider. Stretching and range-of-motion exercises These exercises warm up your muscles and joints and improve the movement and flexibility of your hip and pelvis. Quadriceps stretch, prone  Lie on your abdomen (prone position) on a firm surface, such as a bed or padded floor. Bend your left / right knee and reach back to hold your ankle or pant leg. If you cannot reach your ankle or pant leg, loop  a belt around your foot and grab the belt instead. Gently pull your heel toward your buttocks. Your knee should not slide out to the side. You should feel a stretch in the front of your thigh and knee (quadriceps). Hold this position for __________ seconds. Repeat __________ times. Complete this exercise __________ times a day. Iliotibial band stretch An  iliotibial band is a strong band of muscle tissue that runs from the outer side of your hip to the outer side of your thigh and knee. Lie on your side with your left / right leg in the top position. Bend both of your knees and grab your left / right ankle. Stretch out your bottom arm to help you balance. Slowly bring your top knee back so your thigh goes behind your trunk. Slowly lower your top leg toward the floor until you feel a gentle stretch on the outside of your left / right hip and thigh. If you do not feel a stretch and your knee will not fall farther, place the heel of your other foot on top of your knee and pull your knee down toward the floor with your foot. Hold this position for __________ seconds. Repeat __________ times. Complete this exercise __________ times a day. Strengthening exercises These exercises build strength and endurance in your hip and pelvis. Endurance is the ability to use your muscles for a long time, even after they get tired. Straight leg raises, side-lying This exercise strengthens the muscles that rotate the leg at the hip and move it away from your body (hip abductors). Lie on your side with your left / right leg in the top position. Lie so your head, shoulder, hip, and knee line up. You may bend your bottom knee to help you balance. Roll your hips slightly forward so your hips are stacked directly over each other and your left / right knee is facing forward. Tense the muscles in your outer thigh and lift your top leg 4-6 inches (10-15 cm). Hold this position for __________ seconds. Slowly lower your leg to return to the starting position. Let your muscles relax completely before doing another repetition. Repeat __________ times. Complete this exercise __________ times a day. Leg raises, prone This exercise strengthens the muscles that move the hips backward (hip extensors). Lie on your abdomen (prone position) on your bed or a firm surface. You can put a  pillow under your hips if that is more comfortable for your lower back. Bend your left / right knee so your foot is straight up in the air. Squeeze your buttocks muscles and lift your left / right thigh off the bed. Do not let your back arch. Tense your thigh muscle as hard as you can without increasing any knee pain. Hold this position for __________ seconds. Slowly lower your leg to return to the starting position and allow it to relax completely. Repeat __________ times. Complete this exercise __________ times a day. Hip hike Stand sideways on a bottom step. Stand on your left / right leg with your other foot unsupported next to the step. You can hold on to a railing or wall for balance if needed. Keep your knees straight and your torso square. Then lift your left / right hip up toward the ceiling. Slowly let your left / right hip lower toward the floor, past the starting position. Your foot should get closer to the floor. Do not lean or bend your knees. Repeat __________ times. Complete this exercise __________ times a day.  This information is not intended to replace advice given to you by your health care provider. Make sure you discuss any questions you have with your health care provider. Document Revised: 01/24/2020 Document Reviewed: 01/24/2020 Elsevier Patient Education  [place].

## 2022-01-09 ENCOUNTER — Telehealth: Payer: Self-pay | Admitting: *Deleted

## 2022-01-09 DIAGNOSIS — R7989 Other specified abnormal findings of blood chemistry: Secondary | ICD-10-CM

## 2022-01-09 LAB — CBC WITH DIFFERENTIAL/PLATELET
Absolute Monocytes: 727 {cells}/uL (ref 200–950)
Basophils Absolute: 74 {cells}/uL (ref 0–200)
Basophils Relative: 0.8 %
Eosinophils Absolute: 212 {cells}/uL (ref 15–500)
Eosinophils Relative: 2.3 %
HCT: 44.9 % (ref 35.0–45.0)
Hemoglobin: 14.9 g/dL (ref 11.7–15.5)
Lymphs Abs: 3790 {cells}/uL (ref 850–3900)
MCH: 32.7 pg (ref 27.0–33.0)
MCHC: 33.2 g/dL (ref 32.0–36.0)
MCV: 98.7 fL (ref 80.0–100.0)
MPV: 11.3 fL (ref 7.5–12.5)
Monocytes Relative: 7.9 %
Neutro Abs: 4398 {cells}/uL (ref 1500–7800)
Neutrophils Relative %: 47.8 %
Platelets: 274 10*3/uL (ref 140–400)
RBC: 4.55 Million/uL (ref 3.80–5.10)
RDW: 12.8 % (ref 11.0–15.0)
Total Lymphocyte: 41.2 %
WBC: 9.2 10*3/uL (ref 3.8–10.8)

## 2022-01-09 LAB — COMPLETE METABOLIC PANEL WITH GFR
AG Ratio: 1.1 (calc) (ref 1.0–2.5)
ALT: 10 U/L (ref 6–29)
AST: 23 U/L (ref 10–35)
Albumin: 3.9 g/dL (ref 3.6–5.1)
Alkaline phosphatase (APISO): 63 U/L (ref 37–153)
BUN/Creatinine Ratio: 13 (calc) (ref 6–22)
BUN: 15 mg/dL (ref 7–25)
CO2: 33 mmol/L — ABNORMAL HIGH (ref 20–32)
Calcium: 9.7 mg/dL (ref 8.6–10.4)
Chloride: 102 mmol/L (ref 98–110)
Creat: 1.19 mg/dL — ABNORMAL HIGH (ref 0.50–1.03)
Globulin: 3.6 g/dL (calc) (ref 1.9–3.7)
Glucose, Bld: 113 mg/dL — ABNORMAL HIGH (ref 65–99)
Potassium: 3.4 mmol/L — ABNORMAL LOW (ref 3.5–5.3)
Sodium: 143 mmol/L (ref 135–146)
Total Bilirubin: 0.4 mg/dL (ref 0.2–1.2)
Total Protein: 7.5 g/dL (ref 6.1–8.1)
eGFR: 55 mL/min/{1.73_m2} — ABNORMAL LOW (ref >=60)

## 2022-01-09 NOTE — Addendum Note (Signed)
Addended by: Carole Binning on: 01/09/2022 02:36 PM   Modules accepted: Orders

## 2022-01-09 NOTE — Telephone Encounter (Signed)
-----   Message from Bo Merino, MD sent at 01/09/2022  2:08 PM EST ----- Glucose is mildly elevated probably not a fasting sample.  Creatinine is elevated and stable.  CBC is normal.  Please refer to nephrology for persistent elevation of creatinine.

## 2022-01-09 NOTE — Progress Notes (Signed)
Glucose is mildly elevated probably not a fasting sample.  Creatinine is elevated and stable.  CBC is normal.  Please refer to nephrology for persistent elevation of creatinine.

## 2022-01-15 ENCOUNTER — Ambulatory Visit: Payer: Medicaid Other | Admitting: Rheumatology

## 2022-01-26 ENCOUNTER — Telehealth: Payer: Self-pay | Admitting: Rheumatology

## 2022-01-26 NOTE — Telephone Encounter (Signed)
Patient called the office stating the [place] 20mg  she was taking has started to make her hair fall out so she has stopped taking it. Patient would like to know if there is anything else she can take.

## 2022-01-26 NOTE — Progress Notes (Signed)
Office Visit Note  Patient: Alexandra Powers             Date of Birth: 11-17-1971           MRN: [id number]             PCP: Vevelyn Francois, NP Referring: Vevelyn Francois, NP Visit Date: 01/29/2022 Occupation: @GUAROCC @  Subjective:  Other (Patient reports hair loss with arava, she discontinued medication. )   History of Present Illness: Alexandra Powers is a 51 y.o. female with history of seropositive rheumatoid arthritis.  She states that she has stopped leflunomide in January due to nausea and vomiting.  She started medication again and then stopped it again due to hair loss.  She has not been taking leflunomide for last a week and a half.  She continues to have pain and discomfort in her knee joints in her feet.  None of the other joints are painful or swollen.  Activities of Daily Living:  Patient reports morning stiffness for 45 minutes.   Patient Reports nocturnal pain.  Difficulty dressing/grooming: Reports Difficulty climbing stairs: Reports Difficulty getting out of chair: Reports Difficulty using hands for taps, buttons, cutlery, and/or writing: Reports  Review of Systems  Constitutional:  Positive for fatigue.  HENT:  Negative for mouth sores, mouth dryness and nose dryness.   Eyes:  Negative for pain, itching and dryness.  Respiratory:  Negative for shortness of breath and difficulty breathing.   Cardiovascular:  Negative for chest pain and palpitations.  Gastrointestinal:  Negative for blood in stool, constipation and diarrhea.  Endocrine: Negative for increased urination.  Genitourinary:  Negative for difficulty urinating.  Musculoskeletal:  Positive for joint pain, joint pain, myalgias, morning stiffness, muscle tenderness and myalgias. Negative for joint swelling.  Skin:  Positive for hair loss. Negative for rash and sensitivity to sunlight.  Allergic/Immunologic: Negative for susceptible to infections.  Neurological:  Positive for headaches. Negative for dizziness,  numbness, memory loss and weakness.  Hematological:  Negative for bruising/bleeding tendency.  Psychiatric/Behavioral:  Negative for confusion.    PMFS History:  Patient Active Problem List   Diagnosis Date Noted   CKD (chronic kidney disease), stage III ([facility]) 09/09/2020   Preventive measure 09/09/2020   CKD (chronic kidney disease), stage II 09/08/2019   Trochanteric bursitis of left hip 09/08/2019   Anxiety and depression 09/01/2019   Chronic pain syndrome 08/08/2019   Chronic myofascial pain 08/08/2019   Bilateral breast cysts 08/01/2019   Cellulitis of left breast 07/14/2019   Other rheumatoid arthritis with rheumatoid factor of left hip ([place]) 07/14/2019   Primary osteoarthritis of both hips 07/29/2018   Primary osteoarthritis of both knees 07/29/2018   Primary osteoarthritis of both feet 07/29/2018   Tonsillitis 05/02/2018   Acute prerenal failure ([place] [place]) 09/03/2017   OSA (obstructive sleep apnea) 12/10/2016   GERD (gastroesophageal reflux disease) 12/04/2016   Low back pain at multiple sites 08/21/2016   Chronic pain of multiple joints 08/13/2016   MGUS (monoclonal gammopathy of unknown significance) 07/02/2016   Vitamin D deficiency 07/02/2016   HTN (hypertension), benign 11/11/2015   Morbid obesity ([facility]) 11/11/2015   Adrenal mass ([facility]) 08/30/2015   Ovarian cyst 08/30/2015    Past Medical History:  Diagnosis Date   Arthritis    Gallstones    GERD (gastroesophageal reflux disease)    Hypertension    MGUS (monoclonal gammopathy of unknown significance)    Obesity    Osteoarthritis    PONV (postoperative nausea  and vomiting)    Rheumatoid arthritis (HCC)    Sleep apnea    SENT CPAP BACK, NOT WORN IN 7-8 MONTHS    Family History  Problem Relation Age of Onset   Arthritis Mother    High blood pressure Mother    Sleep apnea Mother    High blood pressure Father    Breast cancer Paternal Aunt    Arthritis Sister    Healthy Daughter    Healthy Son    Adrenal  disorder Neg Hx    Colon cancer Neg Hx    Esophageal cancer Neg Hx    Pancreatic cancer Neg Hx    Stomach cancer Neg Hx    Liver disease Neg Hx    Past Surgical History:  Procedure Laterality Date   BREAST DUCTAL SYSTEM EXCISION Bilateral 10/12/2017   Procedure: EXCISION OF BILATERAL CENTRAL DUCT;  Surgeon: Erroll Luna, MD;  Location: [facility];  Service: General;  Laterality: Bilateral;   BREAST EXCISIONAL BIOPSY     CHOLECYSTECTOMY N/A 12/21/2015   Procedure: LAPAROSCOPIC CHOLECYSTECTOMY;  Surgeon: Georganna Skeans, MD;  Location: [facility];  Service: General;  Laterality: N/A;   UMBILICAL HERNIA REPAIR N/A 12/27/2015   Procedure: INCARCERATED UMBILICAL HERNIA REPAIR;  Surgeon: Coralie Keens, MD;  Location: [facility];  Service: General;  Laterality: N/A;   Social History   Social History Narrative   Lives with husband, Berneta Sages.   Immunization History  Administered Date(s) Administered   Influenza,inj,Quad PF,6+ Mos 08/29/2015, 08/21/2016, 08/31/2017, 08/09/2018, 08/01/2019, 09/09/2020   Influenza,inj,quad, With Preservative 08/30/2017   PFIZER(Purple Top)SARS-COV-2 Vaccination 02/16/2020, 03/08/2020, 12/07/2020   Tdap 08/29/2015     Objective: Vital Signs: BP 128/84 (BP Location: Right Arm, Patient Position: Sitting, Cuff Size: Large)    Pulse 92    Ht 5\' 8"  (1.727 m)    Wt (!) 340 lb (154.2 kg)    LMP 04/16/2019    BMI 51.70 kg/m    Physical Exam Vitals and nursing note reviewed.  Constitutional:      Appearance: She is well-developed.  HENT:     Head: Normocephalic and atraumatic.  Eyes:     Conjunctiva/sclera: Conjunctivae normal.  Cardiovascular:     Rate and Rhythm: Normal rate and regular rhythm.     Heart sounds: Normal heart sounds.  Pulmonary:     Effort: Pulmonary effort is normal.     Breath sounds: Normal breath sounds.  Abdominal:     General: Bowel sounds are normal.     Palpations: Abdomen is soft.  Musculoskeletal:     Cervical back: Normal range of motion.   Lymphadenopathy:     Cervical: No cervical adenopathy.  Skin:    General: Skin is warm and dry.     Capillary Refill: Capillary refill takes less than 2 seconds.  Neurological:     Mental Status: She is alert and oriented to person, place, and time.  Psychiatric:        Behavior: Behavior normal.     Musculoskeletal Exam: Spine was in good range of motion.  Shoulder joints, elbow joints, wrist joints, MCPs PIPs and DIPs with good range of motion with no synovitis.  She mobilizes with the help of a walker.  Hip joints were difficult to assess.  Knee joints with good range of motion without any warmth swelling or effusion.  There was no tenderness over ankle joints or MTPs.  CDAI Exam: CDAI Score: 0.6  Patient Global: 3 mm; Provider Global: 3 mm Swollen: 0 ; Tender:  0  Joint Exam 01/29/2022   No joint exam has been documented for this visit   There is currently no information documented on the homunculus. Go to the Rheumatology activity and complete the homunculus joint exam.  Investigation: No additional findings.  Imaging: No results found.  Recent Labs: Lab Results  Component Value Date   WBC 9.2 01/08/2022   HGB 14.9 01/08/2022   PLT 274 01/08/2022   NA 143 01/08/2022   K 3.4 (L) 01/08/2022   CL 102 01/08/2022   CO2 33 (H) 01/08/2022   GLUCOSE 113 (H) 01/08/2022   BUN 15 01/08/2022   CREATININE 1.19 (H) 01/08/2022   BILITOT 0.4 01/08/2022   ALKPHOS 83 09/04/2021   AST 23 01/08/2022   ALT 10 01/08/2022   PROT 7.5 01/08/2022   ALBUMIN 3.2 (L) 09/04/2021   CALCIUM 9.7 01/08/2022   GFRAA >60 09/02/2020   QFTBGOLDPLUS POSITIVE (A) 07/29/2018    Speciality Comments: PLQ Eye Exam: 08/12/18 WNL @ [facility] Follow up in 1 year.   Procedures:  No procedures performed Allergies: Patient has no known allergies.   Assessment / Plan:     Visit Diagnoses: Rheumatoid arthritis involving multiple sites with positive rheumatoid factor (HCC) - Positive RF, positive  anti-CCP, positive elevated sedimentation rate, positive synovitis on ultrasound: She has no synovitis on examination.  She has not taking any medications on a long-term basis due to side effects.  She has taken leflunomide in the past and discontinued due to nausea.  She restarted leflunomide and then discontinued due to hair loss.  She has been off leflunomide for a week now.  She continues to have pain and discomfort in her knee joints and ankles and feet.  She has not noticed any joint swelling.  I detailed discussion with the patient regarding treatment options.  She had to have an adequate response to Plaquenil, methotrexate and side effects from leflunomide.  Not very many options for left.  She still continues to have nausea and vomiting.  She is awaiting GI work-up.  I discussed the option of either using Azulfidine EN or Biologics.  She is not interested in the medication with a lot of side effects.  Once GI work-up is complete Azulfidine EN can be tried.  I will hold off therapy for now.  I advised her to contact us in case she develops increased joint pain or swelling.  High risk medication use - Arava 20 mg daily. Inadequate response to Plaquenil in the past. D/c MTX-after 1 month-due to no improvement.  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  Primary osteoarthritis of both hands-no synovitis was noted.  Primary osteoarthritis of both hips-she mobilizes with the help of a walker.  Trochanteric bursitis, left hip-she has intermittent pain.  Primary osteoarthritis of both knees-she has chronic pain and discomfort in her knee joints due to severe osteoarthritis.  No warmth swelling or effusion was noted.  Primary osteoarthritis of both feet-chronic discomfort.  HTN (hypertension), benign  Vitamin D deficiency  Nausea and vomiting, unspecified vomiting type-she is awaiting GI  work-up.  Gastroesophageal reflux disease, unspecified whether esophagitis present  MGUS (monoclonal gammopathy of unknown significance)  OSA (obstructive sleep apnea)  Orders: No orders of the defined types were placed in this encounter.  No orders of the defined types were placed in this encounter.    Follow-Up Instructions: Return for Rheumatoid arthritis, Osteoarthritis.   Abel Presto  Estanislado Pandy, MD  Note - This record has been created using Editor, commissioning.  Chart creation errors have been sought, but may not always  have been located. Such creation errors do not reflect on  the standard of medical care.

## 2022-01-29 ENCOUNTER — Ambulatory Visit (INDEPENDENT_AMBULATORY_CARE_PROVIDER_SITE_OTHER): Payer: Medicaid Other | Admitting: Rheumatology

## 2022-01-29 ENCOUNTER — Encounter: Payer: Self-pay | Admitting: Rheumatology

## 2022-01-29 ENCOUNTER — Other Ambulatory Visit: Payer: Self-pay

## 2022-01-29 VITALS — BP 128/84 | HR 92 | Ht 68.0 in | Wt 340.0 lb

## 2022-01-29 DIAGNOSIS — M19041 Primary osteoarthritis, right hand: Secondary | ICD-10-CM | POA: Diagnosis not present

## 2022-01-29 DIAGNOSIS — R7612 Nonspecific reaction to cell mediated immunity measurement of gamma interferon antigen response without active tuberculosis: Secondary | ICD-10-CM

## 2022-01-29 DIAGNOSIS — E559 Vitamin D deficiency, unspecified: Secondary | ICD-10-CM

## 2022-01-29 DIAGNOSIS — I10 Essential (primary) hypertension: Secondary | ICD-10-CM

## 2022-01-29 DIAGNOSIS — R112 Nausea with vomiting, unspecified: Secondary | ICD-10-CM

## 2022-01-29 DIAGNOSIS — K219 Gastro-esophageal reflux disease without esophagitis: Secondary | ICD-10-CM

## 2022-01-29 DIAGNOSIS — R21 Rash and other nonspecific skin eruption: Secondary | ICD-10-CM

## 2022-01-29 DIAGNOSIS — M0579 Rheumatoid arthritis with rheumatoid factor of multiple sites without organ or systems involvement: Secondary | ICD-10-CM | POA: Diagnosis not present

## 2022-01-29 DIAGNOSIS — M19071 Primary osteoarthritis, right ankle and foot: Secondary | ICD-10-CM

## 2022-01-29 DIAGNOSIS — Z79899 Other long term (current) drug therapy: Secondary | ICD-10-CM

## 2022-01-29 DIAGNOSIS — G4733 Obstructive sleep apnea (adult) (pediatric): Secondary | ICD-10-CM

## 2022-01-29 DIAGNOSIS — M7062 Trochanteric bursitis, left hip: Secondary | ICD-10-CM

## 2022-01-29 DIAGNOSIS — D472 Monoclonal gammopathy: Secondary | ICD-10-CM

## 2022-01-29 DIAGNOSIS — M19042 Primary osteoarthritis, left hand: Secondary | ICD-10-CM

## 2022-01-29 DIAGNOSIS — M160 Bilateral primary osteoarthritis of hip: Secondary | ICD-10-CM

## 2022-01-29 DIAGNOSIS — M170 Bilateral primary osteoarthritis of knee: Secondary | ICD-10-CM

## 2022-01-29 DIAGNOSIS — M19072 Primary osteoarthritis, left ankle and foot: Secondary | ICD-10-CM

## 2022-03-23 ENCOUNTER — Other Ambulatory Visit: Payer: Self-pay | Admitting: Nephrology

## 2022-03-23 DIAGNOSIS — I129 Hypertensive chronic kidney disease with stage 1 through stage 4 chronic kidney disease, or unspecified chronic kidney disease: Secondary | ICD-10-CM

## 2022-03-23 DIAGNOSIS — D472 Monoclonal gammopathy: Secondary | ICD-10-CM

## 2022-03-23 DIAGNOSIS — N189 Chronic kidney disease, unspecified: Secondary | ICD-10-CM

## 2022-03-23 DIAGNOSIS — N1831 Chronic kidney disease, stage 3a: Secondary | ICD-10-CM

## 2022-03-26 ENCOUNTER — Ambulatory Visit
Admission: RE | Admit: 2022-03-26 | Discharge: 2022-03-26 | Disposition: A | Discharge status: Home / Self Care | Payer: Medicaid Other | Source: Ambulatory Visit | Attending: Nephrology | Admitting: Nephrology

## 2022-03-26 DIAGNOSIS — N1831 Chronic kidney disease, stage 3a: Secondary | ICD-10-CM

## 2022-03-26 DIAGNOSIS — D472 Monoclonal gammopathy: Secondary | ICD-10-CM

## 2022-03-26 DIAGNOSIS — I129 Hypertensive chronic kidney disease with stage 1 through stage 4 chronic kidney disease, or unspecified chronic kidney disease: Secondary | ICD-10-CM

## 2022-05-06 NOTE — Progress Notes (Signed)
Office Visit Note  Patient: Alexandra Powers             Date of Birth: 1971-11-06           MRN: [id number]             PCP: Vevelyn Francois, NP Referring: Vevelyn Francois, NP Visit Date: 05/14/2022 Occupation: '@GUAROCC'$ @  Subjective:  Medication monitoring   History of Present Illness: Alexandra Powers is a 50 y.o. female with history of seropositive rheumatoid arthritis and osteoarthritis.  Patient is currently not taking any immunosuppressive agents.  She previously discontinued Arava due to increased hair loss.  She previously had an inadequate response to Plaquenil and methotrexate.  She presents today with a flare in her left hand and wrist.  She states the flare initially started about 2 weeks ago.  She denies any overuse or repeated activities to trigger the flare.  She states initially she was having difficulty straightening her left elbow as well as pain with range of motion of the left wrist.  She has been having difficulty performing ADLs due to the severity of pain and stiffness.  The swelling in her left wrist has not been improving.  She has been taking Tylenol as needed for symptomatic relief.  She has tried ice, heat, as well as topical agents with minimal relief.  She continues to have chronic pain in both knee joints.  She remains very sedentary since she has difficulty ambulating due to severity of pain.  She states that she still needs to lose about 47 pounds prior to being able to schedule a knee replacement. She states that she has been experiencing less nausea and vomiting.  She is scheduled to undergo an endoscopic and colonoscopy in July 2023.  Activities of Daily Living:  Patient reports joint stiffness all day   Patient Reports nocturnal pain.  Difficulty dressing/grooming: Reports Difficulty climbing stairs: Reports Difficulty getting out of chair: Reports Difficulty using hands for taps, buttons, cutlery, and/or writing: Reports  Review of Systems  Constitutional:   Negative for fatigue.  HENT:  Negative for mouth sores, mouth dryness and nose dryness.   Eyes:  Negative for pain, visual disturbance and dryness.  Respiratory:  Negative for cough, hemoptysis, shortness of breath and difficulty breathing.   Cardiovascular:  Negative for chest pain, palpitations, hypertension and swelling in legs/feet.  Gastrointestinal:  Negative for blood in stool, constipation and diarrhea.  Endocrine: Negative for increased urination.  Genitourinary:  Negative for difficulty urinating and painful urination.  Musculoskeletal:  Positive for joint pain, gait problem, joint pain, joint swelling, muscle weakness, morning stiffness and muscle tenderness. Negative for myalgias and myalgias.  Skin:  Negative for color change, pallor, rash, hair loss, nodules/bumps, skin tightness, ulcers and sensitivity to sunlight.  Allergic/Immunologic: Negative for susceptible to infections.  Neurological:  Negative for dizziness, headaches and weakness.  Hematological:  Negative for bruising/bleeding tendency and swollen glands.  Psychiatric/Behavioral:  Positive for sleep disturbance. Negative for depressed mood. The patient is not nervous/anxious.     PMFS History:  Patient Active Problem List   Diagnosis Date Noted   CKD (chronic kidney disease), stage III ([place]) 09/09/2020   Preventive measure 09/09/2020   CKD (chronic kidney disease), stage II 09/08/2019   Trochanteric bursitis of left hip 09/08/2019   Anxiety and depression 09/01/2019   Chronic pain syndrome 08/08/2019   Chronic myofascial pain 08/08/2019   Bilateral breast cysts 08/01/2019   Cellulitis of left breast 07/14/2019  Other rheumatoid arthritis with rheumatoid factor of left hip (HCC) 07/14/2019   Primary osteoarthritis of both hips 07/29/2018   Primary osteoarthritis of both knees 07/29/2018   Primary osteoarthritis of both feet 07/29/2018   Tonsillitis 05/02/2018   Acute prerenal failure ([place]) 09/03/2017   OSA  (obstructive sleep apnea) 12/10/2016   GERD (gastroesophageal reflux disease) 12/04/2016   Low back pain at multiple sites 08/21/2016   Chronic pain of multiple joints 08/13/2016   MGUS (monoclonal gammopathy of unknown significance) 07/02/2016   Vitamin D deficiency 07/02/2016   HTN (hypertension), benign 11/11/2015   Morbid obesity ([place]) 11/11/2015   Adrenal mass ([place]) 08/30/2015   Ovarian cyst 08/30/2015    Past Medical History:  Diagnosis Date   Arthritis    Gallstones    GERD (gastroesophageal reflux disease)    Hypertension    Mass of adrenal gland (HCC)    MGUS (monoclonal gammopathy of unknown significance)    Obesity    Osteoarthritis    PONV (postoperative nausea and vomiting)    Rheumatoid arthritis (HCC)    Sleep apnea    SENT CPAP BACK, NOT WORN IN 7-8 MONTHS    Family History  Problem Relation Age of Onset   Arthritis Mother    High blood pressure Mother    Sleep apnea Mother    High blood pressure Father    Breast cancer Paternal Aunt    Arthritis Sister    Healthy Daughter    Healthy Son    Adrenal disorder Neg Hx    Colon cancer Neg Hx    Esophageal cancer Neg Hx    Pancreatic cancer Neg Hx    Stomach cancer Neg Hx    Liver disease Neg Hx    Past Surgical History:  Procedure Laterality Date   BREAST DUCTAL SYSTEM EXCISION Bilateral 10/12/2017   Procedure: EXCISION OF BILATERAL CENTRAL DUCT;  Surgeon: Erroll Luna, MD;  Location: [facility];  Service: General;  Laterality: Bilateral;   BREAST EXCISIONAL BIOPSY     CHOLECYSTECTOMY N/A 12/21/2015   Procedure: LAPAROSCOPIC CHOLECYSTECTOMY;  Surgeon: Georganna Skeans, MD;  Location: [facility];  Service: General;  Laterality: N/A;   UMBILICAL HERNIA REPAIR N/A 12/27/2015   Procedure: INCARCERATED UMBILICAL HERNIA REPAIR;  Surgeon: Coralie Keens, MD;  Location: [facility];  Service: General;  Laterality: N/A;   Social History   Social History Narrative   Lives with husband, Berneta Sages.   Immunization History   Administered Date(s) Administered   Influenza,inj,Quad PF,6+ Mos 08/29/2015, 08/21/2016, 08/31/2017, 08/09/2018, 08/01/2019, 09/09/2020   Influenza,inj,quad, With Preservative 08/30/2017   PFIZER(Purple Top)SARS-COV-2 Vaccination 02/16/2020, 03/08/2020, 12/07/2020   Tdap 08/29/2015     Objective: Vital Signs: BP 126/87 (BP Location: Left Arm, Patient Position: Sitting, Cuff Size: Large)   Pulse 90   Resp 16   Ht '5\' 8"'$  (1.727 m)   Wt (!) 314 lb (142.4 kg)   LMP 04/16/2019   BMI 47.74 kg/m    Physical Exam Vitals and nursing note reviewed.  Constitutional:      Appearance: She is well-developed.  HENT:     Head: Normocephalic and atraumatic.  Eyes:     Conjunctiva/sclera: Conjunctivae normal.  Cardiovascular:     Rate and Rhythm: Normal rate and regular rhythm.     Heart sounds: Normal heart sounds.  Pulmonary:     Effort: Pulmonary effort is normal.     Breath sounds: Normal breath sounds.  Abdominal:     General: Bowel sounds are normal.  Palpations: Abdomen is soft.  Musculoskeletal:     Cervical back: Normal range of motion.  Lymphadenopathy:     Cervical: No cervical adenopathy.  Skin:    General: Skin is warm and dry.     Capillary Refill: Capillary refill takes less than 2 seconds.  Neurological:     Mental Status: She is alert and oriented to person, place, and time.  Psychiatric:        Behavior: Behavior normal.      Musculoskeletal Exam: Patient remained seated during the examination.  C-spine has good ROM.  No midline spinal tenderness.  Shoulder joints and elbow joints have good ROM.  Limited extension of the left wrist with extensor tenosynovitis. Right wrist joint has good ROM with no tenderness or synovitis.  Tenderness and synovitis of the right 1st MCP joint.  Tenderness over several MCP and PIP joints of the left hand.  Painful ROM of both knee joints.  Ankle joints have good ROM with no tenderness or joint swelling.   CDAI Exam: CDAI Score: 10   Patient Global: 5 mm; Provider Global: 5 mm Swollen: 2 ; Tender: 7  Joint Exam 05/14/2022      Right  Left  Wrist     Swollen Tender  MCP 1     Swollen Tender  MCP 2      Tender  MCP 3      Tender  MCP 4      Tender  PIP 2      Tender  PIP 3      Tender     Investigation: No additional findings.  Imaging: No results found.  Recent Labs: Lab Results  Component Value Date   WBC 9.2 01/08/2022   HGB 14.9 01/08/2022   PLT 274 01/08/2022   NA 143 01/08/2022   K 3.4 (L) 01/08/2022   CL 102 01/08/2022   CO2 33 (H) 01/08/2022   GLUCOSE 113 (H) 01/08/2022   BUN 15 01/08/2022   CREATININE 1.19 (H) 01/08/2022   BILITOT 0.4 01/08/2022   ALKPHOS 83 09/04/2021   AST 23 01/08/2022   ALT 10 01/08/2022   PROT 7.5 01/08/2022   ALBUMIN 3.2 (L) 09/04/2021   CALCIUM 9.7 01/08/2022   GFRAA >60 09/02/2020   QFTBGOLDPLUS POSITIVE (A) 07/29/2018    Speciality Comments: PLQ Eye Exam: 08/12/18 WNL @ [facility] Follow up in 1 year.   Procedures:  No procedures performed Allergies: Patient has no known allergies.   Assessment / Plan:     Visit Diagnoses: Rheumatoid arthritis involving multiple sites with positive rheumatoid factor (HCC) - Positive RF, positive anti-CCP, positive elevated sedimentation rate, positive synovitis on ultrasound: Patient presents today experiencing a flare in her left wrist joint was started about 2 weeks ago.  She was initially having difficulty fully extending her left elbow as well as using her left wrist and hand due to the severity of pain and swelling.  She has been taking Tylenol for symptomatic relief with no improvement.  She is also tried ice, heat, and topical agents.  On examination she has extensor tenosynovitis of the left wrist with limited range of motion and tenderness.  She has been having difficulty performing ADLs due to severity of pain and inflammation.  She is not currently taking any immunosuppressive agents.  She previously discontinued  Arava due to increased hair loss.  She had an inadequate response to methotrexate and Plaquenil in the past.  At her last office visit on 01/29/2022  there was discussion of trying sulfasalazine versus Biologics.  The patient was concerned about possible side effects and decided to hold off on adding any medications at that time.  Indications, contraindications, potential side effects of sulfasalazine were discussed again today in detail.  All questions were addressed and consent was obtained.  CBC and CMP will be drawn today and a prescription for sulfasalazine will be sent to the pharmacy pending lab results.  A prednisone taper starting at 20 mg tapering by 5 mg every 4 days was sent to the pharmacy to treat her current flare.  She will follow-up in the office in 8 weeks to assess her response to sulfasalazine.  Medication counseling:  Baseline Immunosuppressant Labs TB GOLD    Latest Ref Rng & Units 07/29/2018    1:56 PM  Quantiferon TB Gold  Quantiferon TB Gold Plus NEGATIVE POSITIVE    Hepatitis Panel    Latest Ref Rng & Units 07/25/2018    9:04 AM  Hepatitis  Hep B Surface Ag NON-REACTI NON-REACTIVE   Hep B IgM NON-REACTI NON-REACTIVE   Hep C Ab NON-REACTI NON-REACTIVE    HIV Lab Results  Component Value Date   HIV NON-REACTIVE 07/25/2018   HIV NONREACTIVE 04/03/2016   Immunoglobulins    Latest Ref Rng & Units 09/04/2021   10:49 AM  Immunoglobulin Electrophoresis  IgG 586 - 1,602 mg/dL 1,654   IgM 26 - 217 mg/dL 106    SPEP    Latest Ref Rng & Units 01/08/2022   11:41 AM  Serum Protein Electrophoresis  Total Protein 6.1 - 8.1 g/dL 7.5    G6PD Lab Results  Component Value Date   G6PDH 13.5 07/25/2018     Chest x-ray: No acute cardiopulmonary disease on 08/05/18.   Does the patient have an allergy to sulfa drugs? No  Patient was counseled on the purpose, proper use, and adverse effects of sulfasalazine including risk of infection and chance of nausea, headache, and sun  sensitivity.  Also discussed risk of skin rash and advised patient to stop the medication and let us know if she develops a rash. Also discussed for the potential of discoloration of the urine, sweat, or tears.  Advised patient to avoid live vaccines.  Recommend annual influenza, Pneumovax 23, Prevnar 13, and Shingrix as indicated.   Reviewed the importance of frequent labs to monitor liver, kidneys, and blood counts.  Standing orders placed and patient to return 1 month after starting therapy and then every 3 months.  Provided patient with educational materials on sulfasalazine and answered all questions.  Patient consented to sulfasalazine use, and consent will be uploaded into the media tab.    Patient dose will be 500 mg BID x1 week and then increase to 500 mg 2 tablets twice daily if tolerated.  Prescription will be sent to pharmacy pending lab results and insurance approval.  High risk medication use -She will be starting sulfasalazine pending lab results today.  She will start on sulfasalazine 500 mg 1 tablet twice daily for 1 week and increase to 500 mg 2 tablets twice daily as tolerated.  Previous therapy includes:  Arava 20 mg daily-discontinued due to hair loss. Inadequate response to Plaquenil in the past. D/c MTX-after 1 month-due to no improvement. G-6PDH WNL-13.5 on 07/25/18.  CBC and CMP were updated on 02/15/2022.  Orders for CBC and CMP were released.  Her next lab work will be due in 1 month and every 3 months to monitor for drug toxicity.  Standing orders for CBC and CMP remain in place. She was advised to notify us if she cannot tolerate taking sulfasalazine. 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  Primary osteoarthritis of both hands: She has tenderness over PIP joints but no inflammation noted.  Primary osteoarthritis of both hips: Chronic pain and stiffness. Difficult to assess ROM in seated position.  Using rollator walker to assist with ambulation.   Trochanteric bursitis, left hip: She experiences some difficulty lying on her left side due to severity of pain in her hip.  Primary osteoarthritis of both knees: Chronic pain.  Patient uses a rollator walker to assist with ambulation.  She remains sedentary due to the severity of pain and stiffness in her knee joints.  According to the patient she has less than 50 pounds to lose prior to being able to schedule a knee replacement.  On examination today she has painful range of motion of both knee joints but no effusion was noted.  Primary osteoarthritis of both feet: She is not experiencing any discomfort in her feet at this time.  She has good range of motion of both ankle joints with no tenderness or synovitis.  Other medical conditions are listed as follows:  HTN (hypertension), benign: Blood pressure was 126/87 today in the office.  Vitamin D deficiency: She is taking vitamin D 50,000 units once weekly.  Gastroesophageal reflux disease, unspecified whether esophagitis present  MGUS (monoclonal gammopathy of unknown significance)  OSA (obstructive sleep apnea)  Orders: Orders Placed This Encounter  Procedures   CBC with Differential/Platelet   COMPLETE METABOLIC PANEL WITH GFR   Meds ordered this encounter  Medications   predniSONE (DELTASONE) 5 MG tablet    Sig: Take 4 tablets by mouth daily x4 days, 3 tablets daily x4 days, 2 tablets daily x4 days, 1 tablet daily x4 days.    Dispense:  40 tablet    Refill:  0    Follow-Up Instructions: Return in about 2 months (around 07/14/2022) for Rheumatoid arthritis, Osteoarthritis.   Ofilia Neas, PA-C  Note - This record has been created using  Dragon software.  Chart creation errors have been sought, but may not always  have been located. Such creation errors do not reflect on  the standard of medical care.

## 2022-05-14 ENCOUNTER — Ambulatory Visit (INDEPENDENT_AMBULATORY_CARE_PROVIDER_SITE_OTHER): Payer: Medicaid Other | Admitting: Physician Assistant

## 2022-05-14 ENCOUNTER — Encounter: Payer: Self-pay | Admitting: Physician Assistant

## 2022-05-14 VITALS — BP 126/87 | HR 90 | Resp 16 | Ht 68.0 in | Wt 314.0 lb

## 2022-05-14 DIAGNOSIS — D472 Monoclonal gammopathy: Secondary | ICD-10-CM

## 2022-05-14 DIAGNOSIS — M160 Bilateral primary osteoarthritis of hip: Secondary | ICD-10-CM

## 2022-05-14 DIAGNOSIS — E559 Vitamin D deficiency, unspecified: Secondary | ICD-10-CM

## 2022-05-14 DIAGNOSIS — M19041 Primary osteoarthritis, right hand: Secondary | ICD-10-CM

## 2022-05-14 DIAGNOSIS — M0579 Rheumatoid arthritis with rheumatoid factor of multiple sites without organ or systems involvement: Secondary | ICD-10-CM

## 2022-05-14 DIAGNOSIS — R7612 Nonspecific reaction to cell mediated immunity measurement of gamma interferon antigen response without active tuberculosis: Secondary | ICD-10-CM | POA: Diagnosis not present

## 2022-05-14 DIAGNOSIS — Z79899 Other long term (current) drug therapy: Secondary | ICD-10-CM

## 2022-05-14 DIAGNOSIS — G4733 Obstructive sleep apnea (adult) (pediatric): Secondary | ICD-10-CM

## 2022-05-14 DIAGNOSIS — M19072 Primary osteoarthritis, left ankle and foot: Secondary | ICD-10-CM

## 2022-05-14 DIAGNOSIS — M7062 Trochanteric bursitis, left hip: Secondary | ICD-10-CM

## 2022-05-14 DIAGNOSIS — M19071 Primary osteoarthritis, right ankle and foot: Secondary | ICD-10-CM

## 2022-05-14 DIAGNOSIS — I10 Essential (primary) hypertension: Secondary | ICD-10-CM

## 2022-05-14 DIAGNOSIS — M170 Bilateral primary osteoarthritis of knee: Secondary | ICD-10-CM

## 2022-05-14 DIAGNOSIS — M19042 Primary osteoarthritis, left hand: Secondary | ICD-10-CM

## 2022-05-14 DIAGNOSIS — K219 Gastro-esophageal reflux disease without esophagitis: Secondary | ICD-10-CM

## 2022-05-14 MED ORDER — PREDNISONE 5 MG PO TABS: ORAL_TABLET | ORAL | 0 refills | Status: DC

## 2022-05-14 NOTE — Patient Instructions (Signed)
Standing Labs We placed an order today for your standing lab work.   Please have your standing labs drawn in 1 month then every 3 months   If possible, please have your labs drawn 2 weeks prior to your appointment so that the provider can discuss your results at your appointment.  Please note that you may see your imaging and lab results in [facility] before we have reviewed them. We may be awaiting multiple results to interpret others before contacting you. Please allow our office up to 72 hours to thoroughly review all of the results before contacting the office for clarification of your results.  We have open lab daily: Monday through Thursday from 1:30-4:30 PM and Friday from 1:30-4:00 PM at the office of Dr. Bo Merino, [facility].   Please be advised, all patients with office appointments requiring lab work will take precedent over walk-in lab work.  If possible, please come for your lab work on Monday and Friday afternoons, as you may experience shorter wait times. The office is located at [address], [place], [place], [place] [address] No appointment is necessary.   Labs are drawn by [facility]. Please bring your co-pay at the time of your lab draw.  You may receive a bill from [facility] for your lab work.  Please note if you are on Hydroxychloroquine and and an order has been placed for a Hydroxychloroquine level, you will need to have it drawn 4 hours or more after your last dose.  If you wish to have your labs drawn at another location, please call the office 24 hours in advance to send orders.  If you have any questions regarding directions or hours of operation,  please call [phone].   As a reminder, please drink plenty of water prior to coming for your lab work. Thanks! Sulfasalazine Tablets What is this medication? SULFASALAZINE (sul fa SAL a zeen) treats ulcerative colitis. It works by decreasing inflammation. It belongs to a group of medications called  salicylates. This medicine may be used for other purposes; ask your health care provider or pharmacist if you have questions. COMMON BRAND NAME(S): Azulfidine, Sulfazine What should I tell my care team before I take this medication? They need to know if you have any of these conditions: Asthma Blood disorders or anemia Glucose-6-phosphate dehydrogenase (G6PD) deficiency Intestinal obstruction Kidney disease Liver disease Porphyria Urinary tract obstruction An unusual reaction to sulfasalazine, sulfa medications, salicylates, or other medications, foods, dyes, or preservatives Pregnant or trying to get pregnant Breast-feeding How should I use this medication? Take this medication by mouth with a full glass of water. Take it as directed on the prescription label at the same time every day. You can take it with or without food. If it upsets your stomach, take it with food. Keep taking it unless your care team tells you to stop. Talk to your care team about the use of this medication in children. While this medication may be prescribed for children as young as 6 years for selected conditions, precautions do apply. Patients over 82 years old may have a stronger reaction and need a smaller dose. Overdosage: If you think you have taken too much of this medicine contact a poison control center or emergency room at once. NOTE: This medicine is only for you. Do not share this medicine with others. What if I miss a dose? If you miss a dose, take it as soon as you can. If it is almost time for your next  dose, take only that dose. Do not take double or extra doses. What may interact with this medication? Digoxin Folic acid This list may not describe all possible interactions. Give your health care provider a list of all the medicines, herbs, non-prescription drugs, or dietary supplements you use. Also tell them if you smoke, drink alcohol, or use illegal drugs. Some items may interact with your  medicine. What should I watch for while using this medication? Visit your care team for regular checks on your progress. Tell your care team if your symptoms do not start to get better or if they get worse. You will need frequent blood and urine checks. This medication can make you more sensitive to the sun. Keep out of the sun. If you cannot avoid being in the sun, wear protective clothing and use sunscreen. Do not use sun lamps or tanning beds/booths. Drink plenty of water while taking this medication. What side effects may I notice from receiving this medication? Side effects that you should report to your care team as soon as possible: Allergic reactions--skin rash, itching, hives, swelling of the face, lips, tongue, or throat Aplastic anemia--unusual weakness or fatigue, dizziness, headache, trouble breathing, increased bleeding or bruising Dry cough, shortness of breath or trouble breathing Heart muscle inflammation--unusual weakness or fatigue, shortness of breath, chest pain, fast or irregular heartbeat, dizziness, swelling of the ankles, feet, or hands Infection--fever, chills, cough, sore throat, wounds that don't heal, pain or trouble when passing urine, general feeling of discomfort or being unwell Kidney injury--decrease in the amount of urine, swelling of the ankles, hands, or feet Liver injury--right upper belly pain, loss of appetite, nausea, light-colored stool, dark yellow or brown urine, yellowing skin or eyes, unusual weakness or fatigue Rash, fever, and swollen lymph nodes Redness, blistering, peeling, or loosening of the skin, including inside the mouth Side effects that usually do not require medical attention (report to your care team if they continue or are bothersome): Dark yellow or orange saliva, sweat, or urine Dizziness Headache Loss of appetite Nausea Upset stomach Vomiting This list may not describe all possible side effects. Call your doctor for medical  advice about side effects. You may report side effects to FDA at 1-800-FDA-1088. Where should I keep my medication? Keep out of the reach of children and pets. Store at room temperature between 15 and 30 degrees C (59 and 86 degrees F). Get rid of any unused medication after the expiration date. To get rid of medications that are no longer needed or have expired: Take the medications to a medication take-back program. Check with your pharmacy or law enforcement to find a location. If you cannot return the medication, check the label or package insert to see if the medication should be thrown out in the garbage or flushed down the toilet. If you are not sure, ask your care team. If it is safe to put it in the trash, take the medication out of the container. Mix the medication with cat litter, dirt, coffee grounds, or other unwanted substance. Seal the mixture in a bag or container. Put it in the trash. NOTE: This sheet is a summary. It may not cover all possible information. If you have questions about this medicine, talk to your doctor, pharmacist, or health care provider.  2023 Elsevier/Gold Standard (2021-10-09 00:00:00)

## 2022-05-14 NOTE — Progress Notes (Signed)
Pharmacy Note  Subjective:  Patient presents today to [facility] for follow up office visit.  Patient seen by pharmacist for counseling on sulfasalazine for rheumatoid arthritis.  Prior therapy includes:leflunomide, Plaquenil, methotrexate.  Objective: CMP     Component Value Date/Time   NA 143 01/08/2022 1141   NA 143 04/24/2021 1223   NA 142 08/05/2017 1057   K 3.4 (L) 01/08/2022 1141   K 2.7 (LL) 08/05/2017 1057   CL 102 01/08/2022 1141   CO2 33 (H) 01/08/2022 1141   CO2 33 (H) 08/05/2017 1057   GLUCOSE 113 (H) 01/08/2022 1141   GLUCOSE 103 08/05/2017 1057   BUN 15 01/08/2022 1141   BUN 14 04/24/2021 1223   BUN 31.2 (H) 08/05/2017 1057   CREATININE 1.19 (H) 01/08/2022 1141   CREATININE 1.8 (H) 08/05/2017 1057   CALCIUM 9.7 01/08/2022 1141   CALCIUM 10.4 08/05/2017 1057   PROT 7.5 01/08/2022 1141   PROT 7.5 04/24/2021 1223   PROT 8.3 08/05/2017 1057   ALBUMIN 3.2 (L) 09/04/2021 1049   ALBUMIN 4.1 04/24/2021 1223   ALBUMIN 3.2 (L) 08/05/2017 1057   AST 23 01/08/2022 1141   AST 18 08/05/2017 1057   ALT 10 01/08/2022 1141   ALT 10 08/05/2017 1057   ALKPHOS 83 09/04/2021 1049   ALKPHOS 62 08/05/2017 1057   BILITOT 0.4 01/08/2022 1141   BILITOT 0.4 04/24/2021 1223   BILITOT 0.46 08/05/2017 1057   GFRNONAA 55 (L) 09/04/2021 1049   GFRNONAA 58 (L) 07/06/2019 1010   GFRAA >60 09/02/2020 1051   GFRAA 68 07/06/2019 1010    CBC    Component Value Date/Time   WBC 9.2 01/08/2022 1141   RBC 4.55 01/08/2022 1141   HGB 14.9 01/08/2022 1141   HGB 12.9 04/20/2019 1016   HGB 12.8 08/05/2017 1057   HCT 44.9 01/08/2022 1141   HCT 39.8 04/20/2019 1016   HCT 39.2 08/05/2017 1057   PLT 274 01/08/2022 1141   PLT 320 04/20/2019 1016   MCV 98.7 01/08/2022 1141   MCV 103 (H) 04/20/2019 1016   MCV 99.7 08/05/2017 1057   MCH 32.7 01/08/2022 1141   MCHC 33.2 01/08/2022 1141   RDW 12.8 01/08/2022 1141   RDW 12.2 04/20/2019 1016   RDW 15.0 (H) 08/05/2017 1057    LYMPHSABS 3,790 01/08/2022 1141   LYMPHSABS 4.7 (H) 04/20/2019 1016   LYMPHSABS 3.8 (H) 08/05/2017 1057   MONOABS 0.6 09/04/2021 1049   MONOABS 0.7 08/05/2017 1057   EOSABS 212 01/08/2022 1141   EOSABS 0.3 04/20/2019 1016   BASOSABS 74 01/08/2022 1141   BASOSABS 0.1 04/20/2019 1016   BASOSABS 0.0 08/05/2017 1057     Baseline Immunosuppressant Therapy Labs TB GOLD    Latest Ref Rng & Units 07/29/2018    1:56 PM  Quantiferon TB Gold  Quantiferon TB Gold Plus NEGATIVE POSITIVE    Hepatitis Panel    Latest Ref Rng & Units 07/25/2018    9:04 AM  Hepatitis  Hep B Surface Ag NON-REACTI NON-REACTIVE   Hep B IgM NON-REACTI NON-REACTIVE   Hep C Ab NON-REACTI NON-REACTIVE    HIV Lab Results  Component Value Date   HIV NON-REACTIVE 07/25/2018   HIV NONREACTIVE 04/03/2016   Immunoglobulins    Latest Ref Rng & Units 09/04/2021   10:49 AM  Immunoglobulin Electrophoresis  IgG 586 - 1,602 mg/dL 1,654   IgM 26 - 217 mg/dL 106    SPEP    Latest Ref Rng & Units 01/08/2022  11:41 AM  Serum Protein Electrophoresis  Total Protein 6.1 - 8.1 g/dL 7.5    G6PD Lab Results  Component Value Date   G6PDH 13.5 07/25/2018   TPMT No results found for: "TPMT"   Chest x-ray: 08/06/18- no acute cardiopulmonary disease  Does the patient have an allergy to sulfa drugs? No  Assessment/Plan: Patient was counseled on the purpose, proper use, and adverse effects of sulfasalazine including risk of infection and chance of nausea, headache, and sun sensitivity.  Also discussed risk of skin rash and advised patient to stop the medication and let us know if she develops a rash. Also discussed for the potential of discoloration of the urine, sweat, or tears.   Reviewed the importance of frequent labs to monitor liver, kidneys, and blood counts.  Standing orders placed and patient to return 2 weeks after starting therapy and then every 3 months.  Provided patient with educational materials on  sulfasalazine and answered all questions.  Patient consented to sulfasalazine use, and consent will be uploaded into the media tab.    Patient dose will be 500 mg twice a day for 1 week and if tolerated, increase to 1000 mg twice daily.

## 2022-05-15 ENCOUNTER — Other Ambulatory Visit: Payer: Self-pay | Admitting: *Deleted

## 2022-05-15 LAB — COMPLETE METABOLIC PANEL WITH GFR
AG Ratio: 1.1 (calc) (ref 1.0–2.5)
ALT: 6 U/L (ref 6–29)
AST: 15 U/L (ref 10–35)
Albumin: 3.8 g/dL (ref 3.6–5.1)
Alkaline phosphatase (APISO): 73 U/L (ref 37–153)
BUN: 10 mg/dL (ref 7–25)
CO2: 32 mmol/L (ref 20–32)
Calcium: 9.4 mg/dL (ref 8.6–10.4)
Chloride: 103 mmol/L (ref 98–110)
Creat: 1 mg/dL (ref 0.50–1.03)
Globulin: 3.5 g/dL (calc) (ref 1.9–3.7)
Glucose, Bld: 95 mg/dL (ref 65–99)
Potassium: 3.5 mmol/L (ref 3.5–5.3)
Sodium: 142 mmol/L (ref 135–146)
Total Bilirubin: 0.4 mg/dL (ref 0.2–1.2)
Total Protein: 7.3 g/dL (ref 6.1–8.1)
eGFR: 68 mL/min/{1.73_m2} (ref >=60)

## 2022-05-15 LAB — CBC WITH DIFFERENTIAL/PLATELET
Absolute Monocytes: 437 cells/uL (ref 200–950)
Basophils Absolute: 36 cells/uL (ref 0–200)
Basophils Relative: 0.4 %
Eosinophils Absolute: 191 cells/uL (ref 15–500)
Eosinophils Relative: 2.1 %
HCT: 45 % (ref 35.0–45.0)
Hemoglobin: 14.9 g/dL (ref 11.7–15.5)
Lymphs Abs: 3039 cells/uL (ref 850–3900)
MCH: 34.4 pg — ABNORMAL HIGH (ref 27.0–33.0)
MCHC: 33.1 g/dL (ref 32.0–36.0)
MCV: 103.9 fL — ABNORMAL HIGH (ref 80.0–100.0)
MPV: 10 fL (ref 7.5–12.5)
Monocytes Relative: 4.8 %
Neutro Abs: 5396 cells/uL (ref 1500–7800)
Neutrophils Relative %: 59.3 %
Platelets: 296 10*3/uL (ref 140–400)
RBC: 4.33 10*6/uL (ref 3.80–5.10)
RDW: 14.2 % (ref 11.0–15.0)
Total Lymphocyte: 33.4 %
WBC: 9.1 10*3/uL (ref 3.8–10.8)

## 2022-05-15 MED ORDER — SULFASALAZINE 500 MG PO TABS: ORAL_TABLET | ORAL | 0 refills | Status: DC

## 2022-05-15 NOTE — Progress Notes (Signed)
CMP WNL.  MCV and MCH are elevated. Rest of CBC WNL.  We will monitor lab work closely.  She will be due to update lab work 1 month then every 3 months after starting sulfasalazine.  Please pend prescription.

## 2022-05-15 NOTE — Telephone Encounter (Signed)
Per office note on 05/14/2022: Patient dose will be 500 mg twice a day for 1 week and if tolerated, increase to 1000 mg twice daily.

## 2022-05-15 NOTE — Telephone Encounter (Signed)
-----   Message from Ofilia Neas, PA-C sent at 05/15/2022  8:06 AM EDT ----- CMP WNL.  MCV and MCH are elevated. Rest of CBC WNL.  We will monitor lab work closely.  She will be due to update lab work 1 month then every 3 months after starting sulfasalazine.  Please pend prescription.

## 2022-05-29 ENCOUNTER — Other Ambulatory Visit: Payer: Self-pay | Admitting: Rheumatology

## 2022-05-29 MED ORDER — PREDNISONE 5 MG PO TABS: ORAL_TABLET | ORAL | 0 refills | Status: DC

## 2022-05-29 NOTE — Telephone Encounter (Signed)
Patient advised we can extend the prednisone taper to 20 mg tapering by 5 mg every week.  Patient advised Lovena Le would not recommend increasing the dose of prednisone further.  Reminded patient to take prednisone in the morning with food and avoid the use of NSAIDs.    Please also clarify if she has started taking sulfasalazine? Patient she has been taking the SSZ for about 2 weeks now.

## 2022-05-29 NOTE — Telephone Encounter (Signed)
We can extend the prednisone taper to 20 mg tapering by 5 mg every week.  I would not recommend increasing the dose of prednisone further.  Please remind the patient to take prednisone in the morning with food and avoid the use of NSAIDs.  Please also clarify if she has started taking sulfasalazine?

## 2022-05-29 NOTE — Telephone Encounter (Signed)
Patient called stating she was prescribed Prednisone dose pack and was suppose to start with 4 tablets for 4 days and then decrease to 3 tablets/4 days, 2 tablets/4 days and 1 tablet/4 days.  Patient states she felt good for the first 2 days, but on the third day it stopped working.  Patient states the pain and swelling returned and she can barely use her hands.  Patient states she needs a higher dose and requested a return call.

## 2022-06-07 ENCOUNTER — Other Ambulatory Visit: Payer: Self-pay | Admitting: Physician Assistant

## 2022-06-08 NOTE — Telephone Encounter (Signed)
Next Visit: 07/16/2022  Last Visit: 05/14/2022  Last Fill: 05/15/2022  DX: Rheumatoid arthritis involving multiple sites with positive rheumatoid factor   Current Dose per office note 05/15/2022: 500 mg twice a day for 1 week and if tolerated, increase to 1000 mg twice daily.    Spoke with patient and she has been tolerating medication and has increased to 2 tablets twice daily. Patient reminded to have labs next week.   Labs: 05/14/2022 CMP WNL.  MCV and MCH are elevated. Rest of CBC WNL.  Okay to refill SSZ?

## 2022-06-11 ENCOUNTER — Ambulatory Visit: Payer: Medicaid Other | Admitting: Physician Assistant

## 2022-06-15 NOTE — Progress Notes (Signed)
Office Visit Note  Patient: Alexandra Powers             Date of Birth: 1971-06-30           MRN: [id number]             PCP: Vevelyn Francois, NP Referring: Vevelyn Francois, NP Visit Date: 06/18/2022 Occupation: '@GUAROCC'$ @  Subjective:  Medication monitoring   History of Present Illness: Alexandra Powers is a 51 y.o. female with history of rheumatoid arthritis and osteoarthritis.  She is taking sulfasalazine 500 mg 2 tablets by mouth twice daily.  She was started on sulfasalazine after her last office visit on 05/14/22.   She has been tolerating sulfasalazine without any side effects.  She presents today with ongoing pain and swelling in the left wrist joint.  She has had difficulty using her left hand and wrist due to severity of pain and inflammation.  She continues to have chronic pain in both knee joints which is unchanged.  She denies any other joint pain or inflammation currently.  She continues to use her rollator walker to assist with ambulation.  She denies any recent infections.   Activities of Daily Living:  Patient reports morning stiffness for all day. Patient Reports nocturnal pain.  Difficulty dressing/grooming: Reports Difficulty climbing stairs: Reports Difficulty getting out of chair: Reports Difficulty using hands for taps, buttons, cutlery, and/or writing: Reports  Review of Systems  Constitutional:  Positive for fatigue.  HENT:  Negative for mouth sores, mouth dryness and nose dryness.   Eyes:  Negative for pain, visual disturbance and dryness.  Respiratory:  Negative for cough, hemoptysis and difficulty breathing.   Cardiovascular:  Negative for chest pain, palpitations, hypertension and swelling in legs/feet.  Gastrointestinal:  Negative for blood in stool, constipation and diarrhea.  Endocrine: Negative for increased urination.  Genitourinary:  Negative for painful urination and involuntary urination.  Musculoskeletal:  Positive for joint pain, joint pain, joint  swelling, myalgias, morning stiffness, muscle tenderness and myalgias. Negative for muscle weakness.  Skin:  Negative for color change, pallor, rash, hair loss, nodules/bumps, skin tightness, ulcers and sensitivity to sunlight.  Allergic/Immunologic: Negative for susceptible to infections.  Neurological:  Negative for dizziness, numbness, headaches and weakness.  Hematological:  Negative for swollen glands.  Psychiatric/Behavioral:  Positive for sleep disturbance. Negative for depressed mood. The patient is not nervous/anxious.     PMFS History:  Patient Active Problem List   Diagnosis Date Noted   CKD (chronic kidney disease), stage III ([place]) 09/09/2020   Preventive measure 09/09/2020   CKD (chronic kidney disease), stage II 09/08/2019   Trochanteric bursitis of left hip 09/08/2019   Anxiety and depression 09/01/2019   Chronic pain syndrome 08/08/2019   Chronic myofascial pain 08/08/2019   Bilateral breast cysts 08/01/2019   Cellulitis of left breast 07/14/2019   Other rheumatoid arthritis with rheumatoid factor of left hip ([place]) 07/14/2019   Primary osteoarthritis of both hips 07/29/2018   Primary osteoarthritis of both knees 07/29/2018   Primary osteoarthritis of both feet 07/29/2018   Tonsillitis 05/02/2018   Acute prerenal failure ([place]) 09/03/2017   OSA (obstructive sleep apnea) 12/10/2016   GERD (gastroesophageal reflux disease) 12/04/2016   Low back pain at multiple sites 08/21/2016   Chronic pain of multiple joints 08/13/2016   MGUS (monoclonal gammopathy of unknown significance) 07/02/2016   Vitamin D deficiency 07/02/2016   HTN (hypertension), benign 11/11/2015   Morbid obesity ([place]) 11/11/2015   Adrenal mass ([place]) 08/30/2015  Ovarian cyst 08/30/2015    Past Medical History:  Diagnosis Date   Arthritis    Gallstones    GERD (gastroesophageal reflux disease)    Hypertension    Mass of adrenal gland (HCC)    MGUS (monoclonal gammopathy of unknown significance)     Obesity    Osteoarthritis    PONV (postoperative nausea and vomiting)    Rheumatoid arthritis (HCC)    Sleep apnea    SENT CPAP BACK, NOT WORN IN 7-8 MONTHS    Family History  Problem Relation Age of Onset   Arthritis Mother    High blood pressure Mother    Sleep apnea Mother    High blood pressure Father    Breast cancer Paternal Aunt    Arthritis Sister    Healthy Daughter    Healthy Son    Adrenal disorder Neg Hx    Colon cancer Neg Hx    Esophageal cancer Neg Hx    Pancreatic cancer Neg Hx    Stomach cancer Neg Hx    Liver disease Neg Hx    Past Surgical History:  Procedure Laterality Date   BREAST DUCTAL SYSTEM EXCISION Bilateral 10/12/2017   Procedure: EXCISION OF BILATERAL CENTRAL DUCT;  Surgeon: Erroll Luna, MD;  Location: [facility];  Service: General;  Laterality: Bilateral;   BREAST EXCISIONAL BIOPSY     CHOLECYSTECTOMY N/A 12/21/2015   Procedure: LAPAROSCOPIC CHOLECYSTECTOMY;  Surgeon: Georganna Skeans, MD;  Location: [facility];  Service: General;  Laterality: N/A;   UMBILICAL HERNIA REPAIR N/A 12/27/2015   Procedure: INCARCERATED UMBILICAL HERNIA REPAIR;  Surgeon: Coralie Keens, MD;  Location: [place] [facility];  Service: General;  Laterality: N/A;   Social History   Social History Narrative   Lives with husband, Berneta Sages.   Immunization History  Administered Date(s) Administered   Influenza,inj,Quad PF,6+ Mos 08/29/2015, 08/21/2016, 08/31/2017, 08/09/2018, 08/01/2019, 09/09/2020   Influenza,inj,quad, With Preservative 08/30/2017   PFIZER(Purple Top)SARS-COV-2 Vaccination 02/16/2020, 03/08/2020, 12/07/2020   Tdap 08/29/2015     Objective: Vital Signs: BP 122/80 (BP Location: Right Wrist, Patient Position: Sitting, Cuff Size: Normal)   Pulse (!) 105   Ht '5\' 8"'$  (1.727 m)   Wt (!) 306 lb 12.8 oz (139.2 kg)   LMP 04/16/2019   BMI 46.65 kg/m    Physical Exam Vitals and nursing note reviewed.  Constitutional:      Appearance: She is well-developed.  HENT:     Head:  Normocephalic and atraumatic.  Eyes:     Conjunctiva/sclera: Conjunctivae normal.  Cardiovascular:     Rate and Rhythm: Normal rate and regular rhythm.     Heart sounds: Normal heart sounds.  Pulmonary:     Effort: Pulmonary effort is normal.     Breath sounds: Normal breath sounds.  Abdominal:     General: Bowel sounds are normal.     Palpations: Abdomen is soft.  Musculoskeletal:     Cervical back: Normal range of motion.  Skin:    General: Skin is warm and dry.     Capillary Refill: Capillary refill takes less than 2 seconds.  Neurological:     Mental Status: She is alert and oriented to person, place, and time.  Psychiatric:        Behavior: Behavior normal.      Musculoskeletal Exam: Patient remained seated during the exam.  C-spine has good range of motion with no discomfort.  Postural thoracic kyphosis noted.  Shoulder joints have good range of motion with some discomfort in the left  shoulder.  Elbow joints have good range of motion with no tenderness or inflammation.  Painful limited range of motion of the left wrist especially with extension. Synovitis of the left wrist. Ganglion cyst on radial aspect of left wrist. Right wrist has good range of motion with no tenderness or synovitis.  No tenderness or synovitis of MCP joints.  Complete fist formation bilaterally.  Hip joints difficult to assess in seated position.  Painful range of motion of both knee joints with limited extension.  Warmth but no effusion of knee joints noted.  Ankle joints have good range of motion with no tenderness or synovitis.  CDAI Exam: CDAI Score: 3  Patient Global: 5 mm; Provider Global: 5 mm Swollen: 1 ; Tender: 1  Joint Exam 06/18/2022      Right  Left  Wrist     Swollen Tender     Investigation: No additional findings.  Imaging: US Guided Needle Placement  Result Date: 06/18/2022 Ultrasound guided injection is preferred based studies that show increased duration, increased effect,  greater accuracy, decreased procedural pain, increased response rate, and decreased cost with ultrasound guided versus blind injection.   Verbal informed consent obtained.  Time-out conducted.  Noted no overlying erythema, induration, or other signs of local infection. Ultrasound-guided left intercarpal joint injection: After sterile prep with Betadine, injected 0.5 mL of 1% lidocaine and 30 mg Kenalog using a 27-gauge needle, left intercarpal joint.     Recent Labs: Lab Results  Component Value Date   WBC 9.1 05/14/2022   HGB 14.9 05/14/2022   PLT 296 05/14/2022   NA 142 05/14/2022   K 3.5 05/14/2022   CL 103 05/14/2022   CO2 32 05/14/2022   GLUCOSE 95 05/14/2022   BUN 10 05/14/2022   CREATININE 1.00 05/14/2022   BILITOT 0.4 05/14/2022   ALKPHOS 83 09/04/2021   AST 15 05/14/2022   ALT 6 05/14/2022   PROT 7.3 05/14/2022   ALBUMIN 3.2 (L) 09/04/2021   CALCIUM 9.4 05/14/2022   GFRAA >60 09/02/2020   QFTBGOLDPLUS POSITIVE (A) 07/29/2018    Speciality Comments: PLQ Eye Exam: 08/12/18 WNL @ [facility] Follow up in 1 year.   Procedures:  Medium Joint Inj: L intercarpal on 06/18/2022 10:09 AM Indications: pain and joint swelling Details: 27 G 1.5 in needle, ultrasound-guided dorsal approach Medications: 1 mL lidocaine 1 %; 30 mg triamcinolone acetonide 40 MG/ML Aspirate: 0 mL Consent was given by the patient. Immediately prior to procedure a time out was called to verify the correct patient, procedure, equipment, support staff and site/side marked as required. Patient was prepped and draped in the usual sterile fashion.     Allergies: Patient has no known allergies.   Assessment / Plan:     Visit Diagnoses: Rheumatoid arthritis involving multiple sites with positive rheumatoid factor (HCC) - Positive RF, positive anti-CCP, positive elevated sedimentation rate, positive synovitis on ultrasound: She presents today with ongoing tenderness and inflammation in the left wrist joint.   She has extensor tenosynovitis of the left wrist as well as a ganglion cyst on radial aspect of left wrist. No tenderness or synovitis over MCP joints.  She continues to have chronic pain in both knee joints which is unchanged.  She is taking sulfasalazine 500 mg 2 tablets by mouth twice daily which was started after her last office visit on 05/14/2022.  She is tolerating sulfasalazine without any side effects and has not missed any doses recently.  She took the prednisone taper prescribed  at her last office visit starting at 20 mg tapering by 5 mg every 4 days but had minimal improvement in her left wrist pain and inflammation.  She requested a left wrist cortisone injection today.  She would like to remain on sulfasalazine and given more time and for [place] to reassess the full efficacy in 6 to 8 weeks.  Discussed that she will likely require combination therapy to manage her rheumatoid arthritis.  We can further discuss treatment options at her follow-up visit if she continues to have persistent pain and inflammation.  High risk medication use - Sulfasalazine 500 mg 2 tablets by mouth twice daily.  Tolerating sulfasalazine without any side effects. CBC and CMP updated on 05/14/22.  Orders for CBC and CMP were released today.  Her next lab work will be due in October and every 3 months.   Standing orders for CBC and CMP remain in place.  Previous therapy: Arava-hair loss, inadequate response to MTX and plaquenil.   Apprehensive to proceed with biologics at this time. She has not had any recent or recurrent infections.  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  Primary osteoarthritis of both hands: No tenderness or inflammation of PIP or DIP joints.  Complete fist formation bilaterally.  Discussed importance of joint protection and muscle  strengthening.  Primary osteoarthritis of both hips: Difficult to assess range of motion in seated position.  Trochanteric bursitis, left hip: She continues to have intermittent discomfort.  Primary osteoarthritis of both knees: Chronic pain.  Painful range of motion of both knee joints with slightly limited extension and warmth.  She uses a rollator walker to assist with ambulation.  She is planning on proceeding with knee replacements once her BMI is less than 40.  Primary osteoarthritis of both feet: She is not experiencing any discomfort in her feet at this time.  She has good range of motion of both ankle joints with no tenderness or warmth.  Other medical conditions are listed as follows:  HTN (hypertension), benign: Blood pressure was 122/80 today in the office.  Vitamin D deficiency: She is taking vitamin D 50,000 units once weekly.  Gastroesophageal reflux disease, unspecified whether esophagitis present  MGUS (monoclonal gammopathy of unknown significance)  OSA (obstructive sleep apnea)  Orders: Orders Placed This Encounter  Procedures   Medium Joint Inj: L intercarpal   US Guided Needle Placement   COMPLETE METABOLIC PANEL WITH GFR   CBC with Differential/Platelet   No orders of the defined types were placed in this encounter.     Follow-Up Instructions: Return in about 8 weeks (around 08/13/2022) for Rheumatoid arthritis, Osteoarthritis.   Ofilia Neas, PA-C  Note - This record has been created using Dragon software.  Chart creation errors have been sought, but may not always  have been located. Such creation errors do not reflect on  the standard of medical care.

## 2022-06-18 ENCOUNTER — Ambulatory Visit (INDEPENDENT_AMBULATORY_CARE_PROVIDER_SITE_OTHER): Payer: Medicaid Other

## 2022-06-18 ENCOUNTER — Encounter: Payer: Self-pay | Admitting: Physician Assistant

## 2022-06-18 ENCOUNTER — Ambulatory Visit (INDEPENDENT_AMBULATORY_CARE_PROVIDER_SITE_OTHER): Payer: Medicaid Other | Admitting: Physician Assistant

## 2022-06-18 VITALS — BP 122/80 | HR 105 | Ht 68.0 in | Wt 306.8 lb

## 2022-06-18 DIAGNOSIS — I10 Essential (primary) hypertension: Secondary | ICD-10-CM

## 2022-06-18 DIAGNOSIS — R7612 Nonspecific reaction to cell mediated immunity measurement of gamma interferon antigen response without active tuberculosis: Secondary | ICD-10-CM | POA: Diagnosis not present

## 2022-06-18 DIAGNOSIS — M0579 Rheumatoid arthritis with rheumatoid factor of multiple sites without organ or systems involvement: Secondary | ICD-10-CM

## 2022-06-18 DIAGNOSIS — Z79899 Other long term (current) drug therapy: Secondary | ICD-10-CM | POA: Diagnosis not present

## 2022-06-18 DIAGNOSIS — K219 Gastro-esophageal reflux disease without esophagitis: Secondary | ICD-10-CM

## 2022-06-18 DIAGNOSIS — M170 Bilateral primary osteoarthritis of knee: Secondary | ICD-10-CM

## 2022-06-18 DIAGNOSIS — M7062 Trochanteric bursitis, left hip: Secondary | ICD-10-CM

## 2022-06-18 DIAGNOSIS — M25532 Pain in left wrist: Secondary | ICD-10-CM | POA: Diagnosis not present

## 2022-06-18 DIAGNOSIS — M19042 Primary osteoarthritis, left hand: Secondary | ICD-10-CM

## 2022-06-18 DIAGNOSIS — G4733 Obstructive sleep apnea (adult) (pediatric): Secondary | ICD-10-CM

## 2022-06-18 DIAGNOSIS — M160 Bilateral primary osteoarthritis of hip: Secondary | ICD-10-CM

## 2022-06-18 DIAGNOSIS — M19072 Primary osteoarthritis, left ankle and foot: Secondary | ICD-10-CM

## 2022-06-18 DIAGNOSIS — M19071 Primary osteoarthritis, right ankle and foot: Secondary | ICD-10-CM

## 2022-06-18 DIAGNOSIS — M19041 Primary osteoarthritis, right hand: Secondary | ICD-10-CM | POA: Diagnosis not present

## 2022-06-18 DIAGNOSIS — D472 Monoclonal gammopathy: Secondary | ICD-10-CM

## 2022-06-18 DIAGNOSIS — E559 Vitamin D deficiency, unspecified: Secondary | ICD-10-CM

## 2022-06-18 MED ORDER — LIDOCAINE HCL 1 % IJ SOLN
1.0000 mL | INTRAMUSCULAR | Status: AC | PRN
  Administered 2022-06-18: 1 mL

## 2022-06-18 MED ORDER — TRIAMCINOLONE ACETONIDE 40 MG/ML IJ SUSP
30.0000 mg | INTRAMUSCULAR | Status: AC | PRN
  Administered 2022-06-18: 30 mg via INTRA_ARTICULAR

## 2022-06-19 LAB — COMPLETE METABOLIC PANEL WITH GFR
AG Ratio: 1.1 (calc) (ref 1.0–2.5)
ALT: 6 U/L (ref 6–29)
AST: 13 U/L (ref 10–35)
Albumin: 4.2 g/dL (ref 3.6–5.1)
Alkaline phosphatase (APISO): 65 U/L (ref 37–153)
BUN/Creatinine Ratio: 15 (calc) (ref 6–22)
BUN: 16 mg/dL (ref 7–25)
CO2: 30 mmol/L (ref 20–32)
Calcium: 9.8 mg/dL (ref 8.6–10.4)
Chloride: 101 mmol/L (ref 98–110)
Creat: 1.05 mg/dL — ABNORMAL HIGH (ref 0.50–1.03)
Globulin: 3.8 g/dL (calc) — ABNORMAL HIGH (ref 1.9–3.7)
Glucose, Bld: 95 mg/dL (ref 65–99)
Potassium: 3.8 mmol/L (ref 3.5–5.3)
Sodium: 142 mmol/L (ref 135–146)
Total Bilirubin: 0.4 mg/dL (ref 0.2–1.2)
Total Protein: 8 g/dL (ref 6.1–8.1)
eGFR: 64 mL/min/{1.73_m2} (ref >=60)

## 2022-06-19 LAB — CBC WITH DIFFERENTIAL/PLATELET
Absolute Monocytes: 741 cells/uL (ref 200–950)
Basophils Absolute: 44 cells/uL (ref 0–200)
Basophils Relative: 0.4 %
Eosinophils Absolute: 185 cells/uL (ref 15–500)
Eosinophils Relative: 1.7 %
HCT: 43.7 % (ref 35.0–45.0)
Hemoglobin: 14.7 g/dL (ref 11.7–15.5)
Lymphs Abs: 3259 cells/uL (ref 850–3900)
MCH: 34.3 pg — ABNORMAL HIGH (ref 27.0–33.0)
MCHC: 33.6 g/dL (ref 32.0–36.0)
MCV: 101.9 fL — ABNORMAL HIGH (ref 80.0–100.0)
MPV: 10.5 fL (ref 7.5–12.5)
Monocytes Relative: 6.8 %
Neutro Abs: 6671 cells/uL (ref 1500–7800)
Neutrophils Relative %: 61.2 %
Platelets: 252 10*3/uL (ref 140–400)
RBC: 4.29 10*6/uL (ref 3.80–5.10)
RDW: 13.8 % (ref 11.0–15.0)
Total Lymphocyte: 29.9 %
WBC: 10.9 10*3/uL — ABNORMAL HIGH (ref 3.8–10.8)

## 2022-06-19 NOTE — Progress Notes (Signed)
White cell count is mildly elevated.  Most likely due to prednisone use.  Creatinine is mildly elevated.  We will continue to monitor labs.  She should avoid use of NSAIDs.  No change in treatment advised.

## 2022-06-26 ENCOUNTER — Other Ambulatory Visit: Payer: Self-pay | Admitting: Rheumatology

## 2022-06-26 MED ORDER — PREDNISONE 5 MG PO TABS: ORAL_TABLET | ORAL | 0 refills | Status: DC

## 2022-06-26 NOTE — Telephone Encounter (Signed)
Ok to send in prednisone 20 mg tapering by 5 mg every week.  Continue on sulfasalazine as prescribed.  Take prednisone in the morning with food and avoid the use of NSAIDs.

## 2022-06-26 NOTE — Telephone Encounter (Signed)
Patient advised we are sending in prednisone 20 mg tapering by 5 mg every week.  Patient advised to continue on sulfasalazine as prescribed.  Take prednisone in the morning with food and avoid the use of NSAIDs. Patient expressed understanding.

## 2022-06-26 NOTE — Telephone Encounter (Signed)
Patient called the office stating she received a cortisone injection in her hand last Thursday. Patient states it has not helped, its still swollen, painful, and feels like pins and needles in wrist. Patient requests a call back on what to do.

## 2022-07-02 ENCOUNTER — Other Ambulatory Visit: Payer: Self-pay | Admitting: Physician Assistant

## 2022-07-02 NOTE — Telephone Encounter (Signed)
Next Visit: 08/14/2022  Last Visit: 06/18/2022  Last Fill: 06/08/2022  DX: Rheumatoid arthritis involving multiple sites with positive rheumatoid factor   Current Dose per office note 06/18/2022: Sulfasalazine 500 mg 2 tablets by mouth twice daily.  Labs: 06/18/2022 White cell count is mildly elevated.  Most likely due to prednisone use.  Creatinine is mildly elevated.    Okay to refill SSZ?

## 2022-07-16 ENCOUNTER — Ambulatory Visit: Payer: Medicaid Other | Admitting: Physician Assistant

## 2022-07-31 NOTE — Progress Notes (Unsigned)
Office Visit Note  Patient: Alexandra Powers             Date of Birth: 10-03-1971           MRN: [id number]             PCP: Vevelyn Francois, NP Referring: Vevelyn Francois, NP Visit Date: 08/14/2022 Occupation: '@GUAROCC'$ @  Subjective:  Left wrist pain and swelling   History of Present Illness: Alexandra Powers is a 51 y.o. female with history of seropositive rheumatoid arthritis and osteoarthritis.  She is taking sulfasalazine 500 mg 2 tablets by mouth twice daily.  She started on sulfasalazine after office visit on 05/14/22.  She has been tolerating sulfasalazine without any side effects.  Patient states she has not noticed any clinical improvement since initiating sulfasalazine.  She continues to have severe pain and swelling in the left wrist joint.  She has had difficulty using the left wrist due to the severity of pain.  At this she experiences numbness with certain positions but most consistently has been experiencing sharp shooting pain in the left wrist.  She continues to have chronic pain in both knee joints due to underlying osteoarthritis.  She has been using a rollator walker to assist with ambulation.  Denies any other joint pain or joint swelling at this time.  She denies any recent or recurrent infections.    Activities of Daily Living:  Patient reports morning stiffness for 0 minutes.   Patient Reports nocturnal pain.  Difficulty dressing/grooming: Reports Difficulty climbing stairs: Reports Difficulty getting out of chair: Reports Difficulty using hands for taps, buttons, cutlery, and/or writing: Reports  Review of Systems  Constitutional:  Positive for fatigue.  HENT:  Negative for mouth sores and mouth dryness.   Eyes:  Negative for dryness.  Respiratory:  Negative for shortness of breath.   Cardiovascular:  Negative for chest pain and palpitations.  Gastrointestinal:  Negative for blood in stool, constipation and diarrhea.  Endocrine: Negative for increased urination.   Genitourinary:  Negative for involuntary urination.  Musculoskeletal:  Positive for joint pain, gait problem, joint pain, joint swelling, myalgias, muscle tenderness and myalgias. Negative for muscle weakness and morning stiffness.  Skin:  Negative for color change, rash, hair loss and sensitivity to sunlight.  Allergic/Immunologic: Negative for susceptible to infections.  Neurological:  Positive for numbness. Negative for dizziness and headaches.  Hematological:  Negative for swollen glands.  Psychiatric/Behavioral:  Positive for sleep disturbance. Negative for depressed mood. The patient is not nervous/anxious.     PMFS History:  Patient Active Problem List   Diagnosis Date Noted   CKD (chronic kidney disease), stage III ([place]) 09/09/2020   Preventive measure 09/09/2020   CKD (chronic kidney disease), stage II 09/08/2019   Trochanteric bursitis of left hip 09/08/2019   Anxiety and depression 09/01/2019   Chronic pain syndrome 08/08/2019   Chronic myofascial pain 08/08/2019   Bilateral breast cysts 08/01/2019   Cellulitis of left breast 07/14/2019   Other rheumatoid arthritis with rheumatoid factor of left hip ([place]) 07/14/2019   Primary osteoarthritis of both hips 07/29/2018   Primary osteoarthritis of both knees 07/29/2018   Primary osteoarthritis of both feet 07/29/2018   Tonsillitis 05/02/2018   Acute prerenal failure ([place]) 09/03/2017   OSA (obstructive sleep apnea) 12/10/2016   GERD (gastroesophageal reflux disease) 12/04/2016   Low back pain at multiple sites 08/21/2016   Chronic pain of multiple joints 08/13/2016   MGUS (monoclonal gammopathy of unknown significance) 07/02/2016  Vitamin D deficiency 07/02/2016   HTN (hypertension), benign 11/11/2015   Morbid obesity ([place]) 11/11/2015   Adrenal mass ([place]) 08/30/2015   Ovarian cyst 08/30/2015    Past Medical History:  Diagnosis Date   Arthritis    Gallstones    GERD (gastroesophageal reflux disease)    Hypertension     Mass of adrenal gland (HCC)    MGUS (monoclonal gammopathy of unknown significance)    Obesity    Osteoarthritis    PONV (postoperative nausea and vomiting)    Rheumatoid arthritis (HCC)    Sleep apnea    SENT CPAP BACK, NOT WORN IN 7-8 MONTHS    Family History  Problem Relation Age of Onset   Arthritis Mother    High blood pressure Mother    Sleep apnea Mother    High blood pressure Father    Breast cancer Paternal Aunt    Arthritis Sister    Healthy Daughter    Healthy Son    Adrenal disorder Neg Hx    Colon cancer Neg Hx    Esophageal cancer Neg Hx    Pancreatic cancer Neg Hx    Stomach cancer Neg Hx    Liver disease Neg Hx    Past Surgical History:  Procedure Laterality Date   BREAST DUCTAL SYSTEM EXCISION Bilateral 10/12/2017   Procedure: EXCISION OF BILATERAL CENTRAL DUCT;  Surgeon: Erroll Luna, MD;  Location: [facility];  Service: General;  Laterality: Bilateral;   BREAST EXCISIONAL BIOPSY     CHOLECYSTECTOMY N/A 12/21/2015   Procedure: LAPAROSCOPIC CHOLECYSTECTOMY;  Surgeon: Georganna Skeans, MD;  Location: [facility];  Service: General;  Laterality: N/A;   UMBILICAL HERNIA REPAIR N/A 12/27/2015   Procedure: INCARCERATED UMBILICAL HERNIA REPAIR;  Surgeon: Coralie Keens, MD;  Location: [facility];  Service: General;  Laterality: N/A;   Social History   Social History Narrative   Lives with husband, Berneta Sages.   Immunization History  Administered Date(s) Administered   Influenza,inj,Quad PF,6+ Mos 08/29/2015, 08/21/2016, 08/31/2017, 08/09/2018, 08/01/2019, 09/09/2020   Influenza,inj,quad, With Preservative 08/30/2017   PFIZER(Purple Top)SARS-COV-2 Vaccination 02/16/2020, 03/08/2020, 12/07/2020   Tdap 08/29/2015     Objective: Vital Signs: BP 104/73 (BP Location: Right Arm, Patient Position: Sitting, Cuff Size: Large)   Pulse 80   Resp 17   Ht '5\' 8"'$  (1.727 m)   Wt (!) 310 lb 3.2 oz (140.7 kg)   LMP 04/16/2019   BMI 47.17 kg/m    Physical Exam Vitals and nursing  note reviewed.  Constitutional:      Appearance: She is well-developed.  HENT:     Head: Normocephalic and atraumatic.  Eyes:     Conjunctiva/sclera: Conjunctivae normal.  Cardiovascular:     Rate and Rhythm: Normal rate and regular rhythm.     Heart sounds: Normal heart sounds.  Pulmonary:     Effort: Pulmonary effort is normal.     Breath sounds: Normal breath sounds.  Abdominal:     General: Bowel sounds are normal.     Palpations: Abdomen is soft.  Musculoskeletal:     Cervical back: Normal range of motion.  Skin:    General: Skin is warm and dry.     Capillary Refill: Capillary refill takes less than 2 seconds.  Neurological:     Mental Status: She is alert and oriented to person, place, and time.  Psychiatric:        Behavior: Behavior normal.      Musculoskeletal Exam: Patient remained seated in rollator walker during examination  today.  C-spine has good range of motion with no discomfort.  No midline spinal tenderness.  Shoulder joints have good range of motion with no discomfort or tenderness.  Elbow joints have good range of motion with no tenderness or synovitis.  She has tenderness and synovitis of the left wrist as well as extensor tenosynovitis noted.  Tenderness over the left fourth and fifth MCP joints.  Complete fist formation bilaterally.  Hip joints difficult to assess in seated position.  Knee joints have good range of motion with some crepitus bilaterally.  Ankle joints have good range of motion with no tenderness or joint swelling.  CDAI Exam: CDAI Score: 5.6  Patient Global: 9 mm; Provider Global: 7 mm Swollen: 1 ; Tender: 3  Joint Exam 08/14/2022      Right  Left  Wrist     Swollen Tender  MCP 4      Tender  MCP 5      Tender     Investigation: No additional findings.  Imaging: No results found.  Recent Labs: Lab Results  Component Value Date   WBC 10.9 (H) 06/18/2022   HGB 14.7 06/18/2022   PLT 252 06/18/2022   NA 142 06/18/2022   K 3.8  06/18/2022   CL 101 06/18/2022   CO2 30 06/18/2022   GLUCOSE 95 06/18/2022   BUN 16 06/18/2022   CREATININE 1.05 (H) 06/18/2022   BILITOT 0.4 06/18/2022   ALKPHOS 83 09/04/2021   AST 13 06/18/2022   ALT 6 06/18/2022   PROT 8.0 06/18/2022   ALBUMIN 3.2 (L) 09/04/2021   CALCIUM 9.8 06/18/2022   GFRAA >60 09/02/2020   QFTBGOLDPLUS POSITIVE (A) 07/29/2018    Speciality Comments: PLQ Eye Exam: 08/12/18 WNL @ [facility] Follow up in 1 year.   Procedures:  No procedures performed Allergies: Patient has no known allergies.   Assessment / Plan:     Visit Diagnoses: Rheumatoid arthritis involving multiple sites with positive rheumatoid factor (HCC) - Positive RF, positive anti-CCP, positive elevated sedimentation rate, positive synovitis on ultrasound: Patient presents today with ongoing tenderness, extensor tenosynovitis, and synovitis of the left wrist joint.  She has been taking sulfasalazine 500 mg 2 tablets by mouth twice daily since her office visit in May 2023.  She has been tolerating sulfasalazine without any side effects but has not noticed any clinical improvement.  She has been experiencing temporary relief while on prednisone but otherwise is having difficulty performing ADLs due to severity of pain and swelling in the left wrist.  X-rays of both hands were updated today to assess for radiographic progression.  Different treatment options were discussed today in detail.  Indications, contraindications, potential side effects of Enbrel were discussed today in detail.  All questions were addressed and consent was obtained.  We will apply for Enbrel through her insurance and once approved she will return to the office for administration of the first injection. She will remain on sulfasalazine as combination therapy.  A prednisone taper starting at 20 mg tapering by 5 mg every week was sent to the pharmacy to treat her current flare.  She will follow-up in the office in 6 to 8 weeks to  assess her response to combination therapy.- Plan: XR Hand 2 View Right, XR Hand 2 View Left  Medication counseling:   TB Test: Pending  Hepatitis panel: Pending  AST:[id number]  SPEP: Pending. Hx of MGUS.  Immunoglobulins: pending   Chest x-ray: No acute cardiopulmonary disease on 08/05/20  Does patient have diagnosis of heart failure?  No  Counseled patient that Enbrel is a TNF blocking agent.  Reviewed Enbrel dose of 50 mg once weekly.  Counseled patient on purpose, proper use, and adverse effects of Enbrel.  Reviewed the most common adverse effects including infections, headache, and injection site reactions. Discussed that there is the possibility of an increased risk of malignancy but it is not well understood if this increased risk is due to the medication or the disease state.  Advised patient to get yearly dermatology exams due to risk of skin cancer.  Reviewed the importance of regular labs while on Enbrel therapy.  Advised patient to get standing labs one month after starting Enbrel then every 2 months.  Provided patient with standing lab orders.  Counseled patient that Enbrel should be held prior to scheduled surgery.  Counseled patient to avoid live vaccines while on Enbrel.  Advised patient to get annual influenza vaccine and the pneumococcal vaccine as needed.  Provided patient with medication education material and answered all questions.  Patient voiced understanding.  Patient consented to Enbrel.  Will upload consent into the media tab.  Reviewed storage instructions for Enbrel.  Advised initial injection must be administered in office.  Patient voiced understanding.    High risk medication use -Applying for Enbrel 50 mg sq injections once weekly.  She will remain on  Sulfasalazine 500 mg 2 tablets by mouth twice daily.  Previous therapy: Arava, MTX, and plaquenil.    - Plan: COMPLETE METABOLIC PANEL WITH GFR, CBC with Differential/Platelet, QuantiFERON-TB Gold Plus, Serum protein  electrophoresis with reflex, IgG, IgA, IgM, Hepatitis B core antibody, IgM, Hepatitis B surface antigen, Hepatitis C antibody, HIV Antibody (routine testing w rflx) CBC and CMP drawn on 06/18/2022.  Orders for CBC and CMP were released today.  The following baseline immunosuppressive labs were ordered in preparation to initiate Enbrel.  She will return for updated CBC and CMP in 1 month and every 3 months to monitor for drug toxicity. 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  We will call to obtain records of treatment completion. Discussed the importance of holding Enbrel if she develops signs or symptoms of an infection and to resume once the infection has completely cleared. Discussed the importance of yearly skin cancer screening while on Enbrel.  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  Underwent treatment with rifampin/INH for 3 months prescribed at the health department.  Treatment was completed in January 2020. - Plan: QuantiFERON-TB Gold Plus  Primary osteoarthritis of both hands: She presents today with ongoing pain and stiffness in the left hand and left wrist.  She has extensor tenosynovitis and synovitis over the ulnar aspect of the left wrist on exam today.  X-rays of both hands were obtained today to assess for radiographic progression.  Pain in left wrist -She presents today with tenderness tenosynovitis and synovitis of the left wrist joint.  She has been experiencing recurrent flares in the left wrist for the past 4-5 months.  She experiences temporary relief while on prednisone but then her symptoms returned.  She is taking sulfasalazine as prescribed but has not noticed any clinical improvement.  Plan is to add on Enbrel as combination therapy.  A prednisone taper starting at 20 mg 3 x 5 mg every week was sent to the pharmacy to treat her current flare.   X-rays updated today. Plan: XR Hand 2 View Left  Primary osteoarthritis  of  both hips: Hip joint range of motion difficult to assess in seated position.  She is not experiencing any groin pain currently.  Trochanteric bursitis, left hip  Primary osteoarthritis of both knees: Chronic pain.  Painful range of motion with crepitus bilaterally.  Patient uses a rollator walker to assist with ambulation.  She continues to try to work on weight loss.  Primary osteoarthritis of both feet: She is not experiencing any discomfort in her feet at this time.  She has good range of motion of both ankle joints with no tenderness or synovitis.  Other medical conditions are listed as follows:   HTN (hypertension), benign: BP was 104/73 today in the office.   Gastroesophageal reflux disease, unspecified whether esophagitis present  Vitamin D deficiency  MGUS (monoclonal gammopathy of unknown significance) - Plan: Serum protein electrophoresis with reflex  OSA (obstructive sleep apnea)   Orders: Orders Placed This Encounter  Procedures   XR Hand 2 View Right   XR Hand 2 View Left   COMPLETE METABOLIC PANEL WITH GFR   CBC with Differential/Platelet   QuantiFERON-TB Gold Plus   Serum protein electrophoresis with reflex   IgG, IgA, IgM   Hepatitis B core antibody, IgM   Hepatitis B surface antigen   Hepatitis C antibody   HIV Antibody (routine testing w rflx)   Meds ordered this encounter  Medications   predniSONE (DELTASONE) 5 MG tablet    Sig: Take 4 tabs po x 7 days, 3  tabs po x 7 days, 2  tabs po x 7 days, 1  tab po x 7 days    Dispense:  70 tablet    Refill:  0      Follow-Up Instructions: Return in about 6 weeks (around 09/25/2022) for Rheumatoid arthritis, Osteoarthritis.   Ofilia Neas, PA-C  Note - This record has been created using Dragon software.  Chart creation errors have been sought, but may not always  have been located. Such creation errors do not reflect on  the standard of medical care.

## 2022-08-14 ENCOUNTER — Ambulatory Visit (INDEPENDENT_AMBULATORY_CARE_PROVIDER_SITE_OTHER): Payer: Medicaid Other

## 2022-08-14 ENCOUNTER — Telehealth: Payer: Self-pay | Admitting: Pharmacist

## 2022-08-14 ENCOUNTER — Ambulatory Visit: Payer: Medicaid Other | Attending: Physician Assistant | Admitting: Physician Assistant

## 2022-08-14 ENCOUNTER — Encounter: Payer: Self-pay | Admitting: Physician Assistant

## 2022-08-14 VITALS — BP 104/73 | HR 80 | Resp 17 | Ht 68.0 in | Wt 310.2 lb

## 2022-08-14 DIAGNOSIS — G4733 Obstructive sleep apnea (adult) (pediatric): Secondary | ICD-10-CM

## 2022-08-14 DIAGNOSIS — M19041 Primary osteoarthritis, right hand: Secondary | ICD-10-CM

## 2022-08-14 DIAGNOSIS — K219 Gastro-esophageal reflux disease without esophagitis: Secondary | ICD-10-CM

## 2022-08-14 DIAGNOSIS — E559 Vitamin D deficiency, unspecified: Secondary | ICD-10-CM

## 2022-08-14 DIAGNOSIS — R7612 Nonspecific reaction to cell mediated immunity measurement of gamma interferon antigen response without active tuberculosis: Secondary | ICD-10-CM

## 2022-08-14 DIAGNOSIS — I10 Essential (primary) hypertension: Secondary | ICD-10-CM

## 2022-08-14 DIAGNOSIS — M19072 Primary osteoarthritis, left ankle and foot: Secondary | ICD-10-CM

## 2022-08-14 DIAGNOSIS — M25532 Pain in left wrist: Secondary | ICD-10-CM

## 2022-08-14 DIAGNOSIS — M0579 Rheumatoid arthritis with rheumatoid factor of multiple sites without organ or systems involvement: Secondary | ICD-10-CM | POA: Diagnosis not present

## 2022-08-14 DIAGNOSIS — M170 Bilateral primary osteoarthritis of knee: Secondary | ICD-10-CM

## 2022-08-14 DIAGNOSIS — M7062 Trochanteric bursitis, left hip: Secondary | ICD-10-CM

## 2022-08-14 DIAGNOSIS — Z79899 Other long term (current) drug therapy: Secondary | ICD-10-CM

## 2022-08-14 DIAGNOSIS — M19071 Primary osteoarthritis, right ankle and foot: Secondary | ICD-10-CM

## 2022-08-14 DIAGNOSIS — D472 Monoclonal gammopathy: Secondary | ICD-10-CM

## 2022-08-14 DIAGNOSIS — M160 Bilateral primary osteoarthritis of hip: Secondary | ICD-10-CM

## 2022-08-14 DIAGNOSIS — M19042 Primary osteoarthritis, left hand: Secondary | ICD-10-CM

## 2022-08-14 MED ORDER — PREDNISONE 5 MG PO TABS: ORAL_TABLET | ORAL | 0 refills | Status: DC

## 2022-08-14 NOTE — Patient Instructions (Signed)
Standing Labs We placed an order today for your standing lab work.   Please have your standing labs drawn in 1 month after starting enbrel and then every 3 months.   If possible, please have your labs drawn 2 weeks prior to your appointment so that the provider can discuss your results at your appointment.  Please note that you may see your imaging and lab results in [facility] before we have reviewed them. We may be awaiting multiple results to interpret others before contacting you. Please allow our office up to 72 hours to thoroughly review all of the results before contacting the office for clarification of your results.  We currently have open lab daily: Monday through Thursday from 1:30 PM-4:30 PM and Friday from 1:30 PM- 4:00 PM If possible, please come for your lab work on Monday, Thursday or Friday afternoons, as you may experience shorter wait times.   Effective September 30, 2022 the new lab hours will change to: Monday through Thursday from 1:30 PM-5:00 PM and Friday from 8:30 AM-12:00 PM If possible, please come for your lab work on Monday and Thursday afternoons, as you may experience shorter wait times.  Please be advised, all patients with office appointments requiring lab work will take precedent over walk-in lab work.    The office is located at [address], [place], [place], [place] [address] No appointment is necessary.   Labs are drawn by [facility]. Please bring your co-pay at the time of your lab draw.  You may receive a bill from [facility] for your lab work.  Please note if you are on Hydroxychloroquine and and an order has been placed for a Hydroxychloroquine level, you will need to have it drawn 4 hours or more after your last dose.  If you wish to have your labs drawn at another location, please call the office 24 hours in advance to send orders.  If you have any questions regarding directions or hours of operation,  please call [phone].   As a reminder, please  drink plenty of water prior to coming for your lab work. Thanks!     Etanercept Injection What is this medication? ETANERCEPT (et a [employer]) treats autoimmune conditions, such as psoriasis and certain types of arthritis. It works by slowing down an overactive immune system. It belongs to a group of medications called TNF inhibitors. This medicine may be used for other purposes; ask your health care provider or pharmacist if you have questions. COMMON BRAND NAME(S): Enbrel What should I tell my care team before I take this medication? They need to know if you have any of these conditions: Bleeding disorder Cancer Diabetes Granulomatosis with polyangiitis Heart failure HIV or AIDS Immune system problems Infection such as tuberculosis (TB) or other bacterial, fungal or viral infections Liver disease Nervous system problems such as Guillain-Barre syndrome, multiple sclerosis or seizures Recent or upcoming vaccine An unusual or allergic reaction to etanercept, other medications, latex, rubber, food, dyes, or preservatives Pregnant or trying to get pregnant Breast-feeding How should I use this medication? The medication is injected under the skin. You will be taught how to prepare and give it. Take it as directed on the prescription label. Keep taking it unless your care team tells you stop. This medication comes with INSTRUCTIONS FOR USE. Ask your pharmacist for directions on how to use this medication. Read the information carefully. Talk to your pharmacist or care team if you have questions. If you use a pen, be sure to  take off the outer needle cover before using the dose. It is important that you put your used needles and syringes in a special sharps container. Do not put them in a trash can. If you do not have a sharps container, call your pharmacist or care team to get one. A special MedGuide will be given to you by the pharmacist with each prescription and refill. Be sure to read  this information carefully each time. Talk to your care team about the use of this medication in children. While it may be prescribed for children as young as 54 years of age for selected conditions, precautions do apply. Overdosage: If you think you have taken too much of this medicine contact a poison control center or emergency room at once. NOTE: This medicine is only for you. Do not share this medicine with others. What if I miss a dose? If you miss a dose, take it as soon as you can. If it is almost time for your next dose, take only that dose. Do not take double or extra doses. What may interact with this medication? Do not take this medication with any of the following: Biologic medications such as adalimumab, certolizumab, golimumab, infliximab Live vaccines Rilonacept This medication may also interact with the following: Abatacept [place] Biologic medications such as anifrolumab, baricitinib, belimumab, canakinumab, natalizumab, rituximab, sarilumab, tocilizumab, tofacitinib, upadacitinib, vedolizumab Cyclophosphamide Sulfasalazine This list may not describe all possible interactions. Give your health care provider a list of all the medicines, herbs, non-prescription drugs, or dietary supplements you use. Also tell them if you smoke, drink alcohol, or use illegal drugs. Some items may interact with your medicine. What should I watch for while using this medication? Visit your care team for regular checks on your progress. Tell your care team if your symptoms do not start to get better or if they get worse. This medication may increase your risk of getting an infection. Call your care team for advice if you get a fever, chills, sore throat, or other symptoms of a cold or flu. Do not treat yourself. Try to avoid being around people who are sick. If you have not had the measles or chickenpox vaccines, tell your care team right away if you are around someone with these viruses. You will be  tested for tuberculosis (TB) before you start this medication. If your care team prescribes any medication for TB, you should start taking the TB medication before starting this medication. Make sure to finish the full course of TB medication. Avoid taking medications that contain aspirin, acetaminophen, ibuprofen, naproxen, or ketoprofen unless instructed by your care team. These medications may hide fever. Talk to your care team about your risk of cancer. You may be more at risk for certain types of cancer if you take this medication. This medication can decrease the response to a vaccine. If you need to get vaccinated, tell your care team if you have received this medication. Extra booster doses may be needed. Talk to your care team to see if a different vaccination schedule is needed. What side effects may I notice from receiving this medication? Side effects that you should report to your care team as soon as possible: Allergic reactions--skin rash, itching, hives, swelling of the face, lips, tongue, or throat Body pain, tingling, or numbness Eye pain, change in vision, vision loss Heart failure--shortness of breath, swelling of the ankles, feet, or hands, sudden weight gain, unusual weakness or fatigue Infection--fever, chills, cough, sore throat, wounds that  don't heal, pain or trouble when passing urine, general feeling of discomfort or being unwell Liver injury--right upper belly pain, loss of appetite, nausea, light-colored stool, dark yellow or brown urine, yellowing skin or eyes, unusual weakness or fatigue Low red blood cell level--unusual weakness or fatigue, dizziness, headache, trouble breathing Lupus-like syndrome--joint pain, swelling, or stiffness, butterfly-shaped rash on the face, rashes that get worse in the sun, fever, unusual weakness or fatigue New or worsening psoriasis--rash with itchy, scaly patches Seizures Unusual bruising or bleeding Weakness in arms and legs Side  effects that usually do not require medical attention (report to your care team if they continue or are bothersome): Headache Pain, redness, or irritation at injection site Sinus pain or pressure around the face or forehead This list may not describe all possible side effects. Call your doctor for medical advice about side effects. You may report side effects to FDA at 1-800-FDA-1088. Where should I keep my medication? Keep out of the reach of children and pets. See product for storage information. Each product may have different instructions. Get rid of any unused medication after the expiration date. To get rid of medications that are no longer needed or have expired: Take the medication to a medication take-back program. Check with your pharmacy or law enforcement to find a location. If you cannot return the medication, ask your pharmacist or care team how to get rid of this medication safely. NOTE: This sheet is a summary. It may not cover all possible information. If you have questions about this medicine, talk to your doctor, pharmacist, or health care provider.  2023 Elsevier/Gold Standard (2021-06-05 00:00:00)

## 2022-08-14 NOTE — Progress Notes (Signed)
X-rays of the left hand reveal soft tissue swelling of the left wrist as well has mild intercarpal and radiocarpal narrowing.  No erosive changes noted.  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    Right hand x-rays were consistent with osteoarthritic changes.  Please notify the patient.

## 2022-08-14 NOTE — Telephone Encounter (Addendum)
Please start Enbrel Mini BIV pending OV note from 08/14/22 to be signed  Dose: '50mg'$  SQ every 7 days  Dx: RA (M05.79)  Previously tried therapies: Leflunomide - hair loss Methotrexate - inadequate clinical response Hydroxychloroquine - inadequate clinical response  Current regimen: SSZ - inadequate clonical response   ----- Message from [place] sent at 08/14/2022 10:13 AM EDT ----- Please apply for enbrel, per Hazel Sams, PA-C. Thanks!   Consent obtained and sent to the scan center.

## 2022-08-16 NOTE — Progress Notes (Signed)
MCV and MCH are elevated please see if vitamin B12 and folate can be added.  CMP stable.

## 2022-08-17 NOTE — Telephone Encounter (Signed)
Submitted a Prior Authorization request to [facility] for ENBREL via CoverMyMeds. Will update once we receive a response.  Key: Dory Peru, PharmD, MPH, BCPS, CPP Clinical Pharmacist (Rheumatology and Pulmonology)

## 2022-08-18 ENCOUNTER — Other Ambulatory Visit (HOSPITAL_COMMUNITY): Payer: Self-pay

## 2022-08-18 NOTE — Telephone Encounter (Signed)
Received notification from [facility] regarding a prior authorization for ENBREL. Authorization has been APPROVED from 08/17/22 to 08/18/23.   Per test claim, copay for 28 days supply is $4  Patient can fill through [facility]: [phone]   Authorization # [id number]  New start visit scheduling is pending all baseline immunosuppressive labs to result  Knox Saliva, PharmD, MPH, BCPS, CPP Clinical Pharmacist (Rheumatology and Pulmonology)

## 2022-08-20 LAB — COMPLETE METABOLIC PANEL WITH GFR
AG Ratio: 1 (calc) (ref 1.0–2.5)
ALT: 5 U/L — ABNORMAL LOW (ref 6–29)
AST: 14 U/L (ref 10–35)
Albumin: 3.7 g/dL (ref 3.6–5.1)
Alkaline phosphatase (APISO): 65 U/L (ref 37–153)
BUN: 11 mg/dL (ref 7–25)
CO2: 26 mmol/L (ref 20–32)
Calcium: 9.1 mg/dL (ref 8.6–10.4)
Chloride: 103 mmol/L (ref 98–110)
Creat: 0.92 mg/dL (ref 0.50–1.03)
Globulin: 3.8 g/dL (calc) — ABNORMAL HIGH (ref 1.9–3.7)
Glucose, Bld: 75 mg/dL (ref 65–99)
Potassium: 3.8 mmol/L (ref 3.5–5.3)
Sodium: 141 mmol/L (ref 135–146)
Total Bilirubin: 0.4 mg/dL (ref 0.2–1.2)
Total Protein: 7.5 g/dL (ref 6.1–8.1)
eGFR: 75 mL/min/{1.73_m2} (ref >=60)

## 2022-08-20 LAB — CBC WITH DIFFERENTIAL/PLATELET
Absolute Monocytes: 517 cells/uL (ref 200–950)
Basophils Absolute: 56 cells/uL (ref 0–200)
Basophils Relative: 0.6 %
Eosinophils Absolute: 235 cells/uL (ref 15–500)
Eosinophils Relative: 2.5 %
HCT: 39 % (ref 35.0–45.0)
Hemoglobin: 12.9 g/dL (ref 11.7–15.5)
Lymphs Abs: 3525 cells/uL (ref 850–3900)
MCH: 34.4 pg — ABNORMAL HIGH (ref 27.0–33.0)
MCHC: 33.1 g/dL (ref 32.0–36.0)
MCV: 104 fL — ABNORMAL HIGH (ref 80.0–100.0)
MPV: 11 fL (ref 7.5–12.5)
Monocytes Relative: 5.5 %
Neutro Abs: 5067 cells/uL (ref 1500–7800)
Neutrophils Relative %: 53.9 %
Platelets: 292 10*3/uL (ref 140–400)
RBC: 3.75 10*6/uL — ABNORMAL LOW (ref 3.80–5.10)
RDW: 13.3 % (ref 11.0–15.0)
Total Lymphocyte: 37.5 %
WBC: 9.4 10*3/uL (ref 3.8–10.8)

## 2022-08-20 LAB — PROTEIN ELECTROPHORESIS, SERUM, WITH REFLEX
Albumin ELP: 3.6 g/dL — ABNORMAL LOW (ref 3.8–4.8)
Alpha 1: 0.4 g/dL — ABNORMAL HIGH (ref 0.2–0.3)
Alpha 2: 0.8 g/dL (ref 0.5–0.9)
Beta 2: 0.8 g/dL — ABNORMAL HIGH (ref 0.2–0.5)
Beta Globulin: 0.5 g/dL (ref 0.4–0.6)
Gamma Globulin: 1.6 g/dL (ref 0.8–1.7)
Total Protein: 7.7 g/dL (ref 6.1–8.1)

## 2022-08-20 LAB — QUANTIFERON-TB GOLD PLUS
Mitogen-NIL: >10 IU/mL
NIL: 0.05 IU/mL
QuantiFERON-TB Gold Plus: NEGATIVE
TB1-NIL: 0.18 IU/mL
TB2-NIL: 0.07 IU/mL

## 2022-08-20 LAB — TEST AUTHORIZATION

## 2022-08-20 LAB — IFE INTERPRETATION

## 2022-08-20 LAB — HEPATITIS B CORE ANTIBODY, IGM: Hep B C IgM: NONREACTIVE

## 2022-08-20 LAB — B12 AND FOLATE PANEL
Folate: 3.4 ng/mL — ABNORMAL LOW
Vitamin B-12: 358 pg/mL (ref 200–1100)

## 2022-08-20 LAB — HEPATITIS B SURFACE ANTIGEN: Hepatitis B Surface Ag: NONREACTIVE

## 2022-08-20 LAB — HEPATITIS C ANTIBODY: Hepatitis C Ab: NONREACTIVE

## 2022-08-20 LAB — IGG, IGA, IGM
IgG (Immunoglobin G), Serum: 1728 mg/dL — ABNORMAL HIGH (ref 600–1640)
IgM, Serum: 88 mg/dL (ref 50–300)
Immunoglobulin A: 671 mg/dL — ABNORMAL HIGH (ref 47–310)

## 2022-08-20 LAB — HIV ANTIBODY (ROUTINE TESTING W REFLEX): HIV 1&2 Ab, 4th Generation: NONREACTIVE

## 2022-08-20 NOTE — Telephone Encounter (Signed)
ATC patient to schedule Enbrel new start. Phone went straight to VM. Left VM requesting return call  Knox Saliva, PharmD, MPH, BCPS, CPP Clinical Pharmacist (Rheumatology and Pulmonology)

## 2022-08-20 NOTE — Progress Notes (Signed)
Folate is low.  Patient should take folic acid 2 mg daily.  CMP is normal.  Immunoglobulins are elevated due to chronic inflammation.  B12 is normal.  TB Gold is negative.  Hepatitis panel negative.  HIV negative.  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  Please refer patient to hematology for the evaluation of abnormal IFE.  According to [facility] note patient was to start on Enbrel.

## 2022-08-24 NOTE — Telephone Encounter (Signed)
Spoke with patient. Scheduled for Enbrel new start visit on 08/31/22  Knox Saliva, PharmD, MPH, BCPS, CPP Clinical Pharmacist (Rheumatology and Pulmonology)

## 2022-08-25 ENCOUNTER — Other Ambulatory Visit (HOSPITAL_COMMUNITY): Payer: Self-pay

## 2022-08-25 NOTE — Progress Notes (Signed)
Pharmacy Note  Subjective:   Patient presents to clinic today to receive first dose of Enbrel for rheumatoid arthritis. Patient currently takes sulfasalazine 1000 mg twice daily. She is naive to biologics for RA but is familiar with injectable medications (previously on Saxenda)  Patient running a fever or have signs/symptoms of infection? No  Patient currently on antibiotics for the treatment of infection? No  Patient have any upcoming invasive procedures/surgeries? No  Objective: CMP     Component Value Date/Time   NA 141 08/14/2022 1003   NA 143 04/24/2021 1223   NA 142 08/05/2017 1057   K 3.8 08/14/2022 1003   K 2.7 (LL) 08/05/2017 1057   CL 103 08/14/2022 1003   CO2 26 08/14/2022 1003   CO2 33 (H) 08/05/2017 1057   GLUCOSE 75 08/14/2022 1003   GLUCOSE 103 08/05/2017 1057   BUN 11 08/14/2022 1003   BUN 14 04/24/2021 1223   BUN 31.2 (H) 08/05/2017 1057   CREATININE 0.92 08/14/2022 1003   CREATININE 1.8 (H) 08/05/2017 1057   CALCIUM 9.1 08/14/2022 1003   CALCIUM 10.4 08/05/2017 1057   PROT 7.5 08/14/2022 1003   PROT 7.7 08/14/2022 1003   PROT 7.5 04/24/2021 1223   PROT 8.3 08/05/2017 1057   ALBUMIN 3.2 (L) 09/04/2021 1049   ALBUMIN 4.1 04/24/2021 1223   ALBUMIN 3.2 (L) 08/05/2017 1057   AST 14 08/14/2022 1003   AST 18 08/05/2017 1057   ALT 5 (L) 08/14/2022 1003   ALT 10 08/05/2017 1057   ALKPHOS 83 09/04/2021 1049   ALKPHOS 62 08/05/2017 1057   BILITOT 0.4 08/14/2022 1003   BILITOT 0.4 04/24/2021 1223   BILITOT 0.46 08/05/2017 1057   GFRNONAA 55 (L) 09/04/2021 1049   GFRNONAA 58 (L) 07/06/2019 1010   GFRAA >60 09/02/2020 1051   GFRAA 68 07/06/2019 1010    CBC    Component Value Date/Time   WBC 9.4 08/14/2022 1003   RBC 3.75 (L) 08/14/2022 1003   HGB 12.9 08/14/2022 1003   HGB 12.9 04/20/2019 1016   HGB 12.8 08/05/2017 1057   HCT 39.0 08/14/2022 1003   HCT 39.8 04/20/2019 1016   HCT 39.2 08/05/2017 1057   PLT 292 08/14/2022 1003   PLT 320 04/20/2019  1016   MCV 104.0 (H) 08/14/2022 1003   MCV 103 (H) 04/20/2019 1016   MCV 99.7 08/05/2017 1057   MCH 34.4 (H) 08/14/2022 1003   MCHC 33.1 08/14/2022 1003   RDW 13.3 08/14/2022 1003   RDW 12.2 04/20/2019 1016   RDW 15.0 (H) 08/05/2017 1057   LYMPHSABS 3,525 08/14/2022 1003   LYMPHSABS 4.7 (H) 04/20/2019 1016   LYMPHSABS 3.8 (H) 08/05/2017 1057   MONOABS 0.6 09/04/2021 1049   MONOABS 0.7 08/05/2017 1057   EOSABS 235 08/14/2022 1003   EOSABS 0.3 04/20/2019 1016   BASOSABS 56 08/14/2022 1003   BASOSABS 0.1 04/20/2019 1016   BASOSABS 0.0 08/05/2017 1057    Baseline Immunosuppressant Therapy Labs TB GOLD    Latest Ref Rng & Units 08/14/2022   10:03 AM  Quantiferon TB Gold  Quantiferon TB Gold Plus NEGATIVE NEGATIVE    Hepatitis Panel    Latest Ref Rng & Units 08/14/2022   10:03 AM  Hepatitis  Hep B Surface Ag NON-REACTIVE NON-REACTIVE   Hep B IgM NON-REACTIVE NON-REACTIVE   Hep C Ab NON-REACTIVE NON-REACTIVE    HIV Lab Results  Component Value Date   HIV NON-REACTIVE 08/14/2022   HIV NON-REACTIVE 07/25/2018   HIV NONREACTIVE 04/03/2016  Immunoglobulins    Latest Ref Rng & Units 08/14/2022   10:03 AM  Immunoglobulin Electrophoresis  IgA  47 - 310 mg/dL 671   IgG 600 - 1,640 mg/dL 1,728   IgM 50 - 300 mg/dL 88    SPEP    Latest Ref Rng & Units 08/14/2022   10:03 AM  Serum Protein Electrophoresis  Total Protein 6.1 - 8.1 g/dL 6.1 - 8.1 g/dL 7.5    7.7   Albumin 3.8 - 4.8 g/dL 3.6   Alpha-1 0.2 - 0.3 g/dL 0.4   Alpha-2 0.5 - 0.9 g/dL 0.8   Beta Globulin 0.4 - 0.6 g/dL 0.5   Beta 2 0.2 - 0.5 g/dL 0.8   Gamma Globulin 0.8 - 1.7 g/dL 1.6    G6PD Lab Results  Component Value Date   G6PDH 13.5 07/25/2018   TPMT No results found for: "TPMT"   Chest x-ray: 08/06/2018 - No acute cardiopulmonary disease.  Assessment/Plan:  Reviewed importance of holding ENBREL with signs/symptoms of an infections, if antibiotics are prescribed to treat an active infection, and  with invasive procedures  Demonstrated proper injection technique with Enbrel Mini and injector demo device  Patient able to demonstrate proper injection technique using the teach back method.  Patient self injected in the right lower abdomen with:  Sample Medication: Enbrel '50mg'$ /ml Mini cartridge NDC: [address]-46 Lot: [id number] Expiration: 01/2024  Sample Medication: Enbrel Autotouch injector device NDC: [address]-48 Lot: [id number] Expiration: 04/29/2025  Patient tolerated well.  Observed for 30 mins in office for adverse reaction and none noted.   Patient is to return in 1 month for labs and 6-8 weeks for follow-up appointment.  Standing orders placed. Referral to [facility] placed today for yearly skin checks while on TNF inhibitor due to risk for non-melanoma skin cancer  ENBREL approved through insurance .   Rx sent to: [facility]: [phone] .  Patient provided with pharmacy phone number and advised to that [facility] will call her later this week to schedule first shipment to home. Subsequent shipments will be set up by [facility] staff  Patient will continue ENBREL '50mg'$  SQ every 7 days in combination with sulfasalazine '1000mg'$  twice daily.  All questions encouraged and answered.  Instructed patient to call with any further questions or concerns.  Knox Saliva, PharmD, MPH, BCPS, CPP Clinical Pharmacist (Rheumatology and Pulmonology)  08/31/2022 10:12 AM

## 2022-08-25 NOTE — Patient Instructions (Signed)
Your next ENBREL dose is due on 09/07/22, 09/14/22, and every 7 days thereafter  CONTINUE sulfasalazine 2 tablets twice daily  HOLD ENBREL if you have signs or symptoms of an infection. You can resume once you feel better or back to your baseline. HOLD ENBREL if you start antibiotics to treat an infection. HOLD ENBREL around the time of surgery/procedures. Your surgeon will be able to provide recommendations on when to hold BEFORE and when you are cleared to [facility].  Pharmacy information: Your prescription will be shipped from [facility] Their phone number is [phone] Julieta Gutting will reach out regarding the first shipment. The pharmacy will call you to schedule shipments and confirm address. They will mail your medication to your home.  Labs are due in 1 month then every 3 months. Lab hours are from Monday to Thursday 1:30-4:30pm and Friday 1:30-4pm. You do not need an appointment if you come for labs during these times.  How to manage an injection site reaction: Remember the 5 C's: COUNTER - leave on the counter at least 30 minutes but up to overnight to bring medication to room temperature. This may help prevent stinging COLD - place something cold (like an ice gel pack or cold water bottle) on the injection site just before cleansing with alcohol. This may help reduce pain CLARITIN - use Claritin (generic name is loratadine) for the first two weeks of treatment or the day of, the day before, and the day after injecting. This will help to minimize injection site reactions CORTISONE CREAM - apply if injection site is irritated and itching CALL ME - if injection site reaction is bigger than the size of your fist, looks infected, blisters, or if you develop hives

## 2022-08-31 ENCOUNTER — Ambulatory Visit: Payer: Medicaid Other | Attending: Rheumatology | Admitting: Pharmacist

## 2022-08-31 ENCOUNTER — Other Ambulatory Visit (HOSPITAL_COMMUNITY): Payer: Self-pay

## 2022-08-31 DIAGNOSIS — M0579 Rheumatoid arthritis with rheumatoid factor of multiple sites without organ or systems involvement: Secondary | ICD-10-CM

## 2022-08-31 DIAGNOSIS — Z79899 Other long term (current) drug therapy: Secondary | ICD-10-CM

## 2022-08-31 DIAGNOSIS — Z7189 Other specified counseling: Secondary | ICD-10-CM

## 2022-08-31 MED ORDER — ENBREL MINI 50 MG/ML ~~LOC~~ SOCT
50.0000 mg | SUBCUTANEOUS | 0 refills | Status: DC
  Filled 2022-08-31: qty 12, fill #0
  Filled 2022-09-02: qty 4, 28d supply, fill #0
  Filled 2022-09-28: qty 4, 28d supply, fill #1
  Filled 2022-10-20: qty 4, 28d supply, fill #2

## 2022-09-01 ENCOUNTER — Other Ambulatory Visit (HOSPITAL_COMMUNITY): Payer: Self-pay

## 2022-09-02 ENCOUNTER — Other Ambulatory Visit (HOSPITAL_COMMUNITY): Payer: Self-pay

## 2022-09-02 NOTE — Progress Notes (Signed)
Onboarding tasks have been completed for this patient. CC information has been collected and will be provided to Call Center

## 2022-09-02 NOTE — Progress Notes (Signed)
Delivery instructions have been updated in [facility], medication will be shipped to patient's home address by 10.6.23.  Rx has been processed in [facility] and there is a copay of $4.

## 2022-09-03 ENCOUNTER — Other Ambulatory Visit (HOSPITAL_COMMUNITY): Payer: Self-pay

## 2022-09-09 ENCOUNTER — Other Ambulatory Visit (HOSPITAL_COMMUNITY): Payer: Self-pay

## 2022-09-18 ENCOUNTER — Inpatient Hospital Stay: Payer: Medicaid Other | Attending: Hematology and Oncology

## 2022-09-18 DIAGNOSIS — D472 Monoclonal gammopathy: Secondary | ICD-10-CM | POA: Insufficient documentation

## 2022-09-18 DIAGNOSIS — Z23 Encounter for immunization: Secondary | ICD-10-CM | POA: Diagnosis not present

## 2022-09-18 DIAGNOSIS — N183 Chronic kidney disease, stage 3 unspecified: Secondary | ICD-10-CM | POA: Diagnosis not present

## 2022-09-18 DIAGNOSIS — Z79899 Other long term (current) drug therapy: Secondary | ICD-10-CM | POA: Diagnosis not present

## 2022-09-18 LAB — CBC WITH DIFFERENTIAL/PLATELET
Abs Immature Granulocytes: 0.04 10*3/uL (ref 0.00–0.07)
Basophils Absolute: 0.1 10*3/uL (ref 0.0–0.1)
Basophils Relative: 0 %
Eosinophils Absolute: 0.3 10*3/uL (ref 0.0–0.5)
Eosinophils Relative: 2 %
HCT: 36.4 % (ref 36.0–46.0)
Hemoglobin: 12.1 g/dL (ref 12.0–15.0)
Immature Granulocytes: 0 %
Lymphocytes Relative: 47 %
Lymphs Abs: 6.1 10*3/uL — ABNORMAL HIGH (ref 0.7–4.0)
MCH: 34.8 pg — ABNORMAL HIGH (ref 26.0–34.0)
MCHC: 33.2 g/dL (ref 30.0–36.0)
MCV: 104.6 fL — ABNORMAL HIGH (ref 80.0–100.0)
Monocytes Absolute: 0.7 10*3/uL (ref 0.1–1.0)
Monocytes Relative: 5 %
Neutro Abs: 6 10*3/uL (ref 1.7–7.7)
Neutrophils Relative %: 46 %
Platelets: 258 10*3/uL (ref 150–400)
RBC: 3.48 MIL/uL — ABNORMAL LOW (ref 3.87–5.11)
RDW: 14.7 % (ref 11.5–15.5)
Smear Review: NORMAL
WBC: 13.1 10*3/uL — ABNORMAL HIGH (ref 4.0–10.5)
nRBC: 0 % (ref 0.0–0.2)

## 2022-09-18 LAB — COMPREHENSIVE METABOLIC PANEL
ALT: 7 U/L (ref 0–44)
AST: 12 U/L — ABNORMAL LOW (ref 15–41)
Albumin: 3.8 g/dL (ref 3.5–5.0)
Alkaline Phosphatase: 57 U/L (ref 38–126)
Anion gap: 5 (ref 5–15)
BUN: 23 mg/dL — ABNORMAL HIGH (ref 6–20)
CO2: 31 mmol/L (ref 22–32)
Calcium: 9.2 mg/dL (ref 8.9–10.3)
Chloride: 106 mmol/L (ref 98–111)
Creatinine, Ser: 1.2 mg/dL — ABNORMAL HIGH (ref 0.44–1.00)
GFR, Estimated: 54 mL/min — ABNORMAL LOW (ref >60)
Glucose, Bld: 78 mg/dL (ref 70–99)
Potassium: 3.5 mmol/L (ref 3.5–5.1)
Sodium: 142 mmol/L (ref 135–145)
Total Bilirubin: 0.5 mg/dL (ref 0.3–1.2)
Total Protein: 7.7 g/dL (ref 6.5–8.1)

## 2022-09-21 ENCOUNTER — Other Ambulatory Visit (HOSPITAL_COMMUNITY): Payer: Self-pay

## 2022-09-21 LAB — KAPPA/LAMBDA LIGHT CHAINS
Kappa free light chain: 54.8 mg/L — ABNORMAL HIGH (ref 3.3–19.4)
Kappa, lambda light chain ratio: 2.98 — ABNORMAL HIGH (ref 0.26–1.65)
Lambda free light chains: 18.4 mg/L (ref 5.7–26.3)

## 2022-09-23 LAB — MULTIPLE MYELOMA PANEL, SERUM
Albumin SerPl Elph-Mcnc: 3.5 g/dL (ref 2.9–4.4)
Albumin/Glob SerPl: 1 (ref 0.7–1.7)
Alpha 1: 0.2 g/dL (ref 0.0–0.4)
Alpha2 Glob SerPl Elph-Mcnc: 0.7 g/dL (ref 0.4–1.0)
B-Globulin SerPl Elph-Mcnc: 1.6 g/dL — ABNORMAL HIGH (ref 0.7–1.3)
Gamma Glob SerPl Elph-Mcnc: 1.2 g/dL (ref 0.4–1.8)
Globulin, Total: 3.7 g/dL (ref 2.2–3.9)
IgA: 649 mg/dL — ABNORMAL HIGH (ref 87–352)
IgG (Immunoglobin G), Serum: 1548 mg/dL (ref 586–1602)
IgM (Immunoglobulin M), Srm: 81 mg/dL (ref 26–217)
Total Protein ELP: 7.2 g/dL (ref 6.0–8.5)

## 2022-09-24 ENCOUNTER — Ambulatory Visit: Payer: Medicaid Other | Admitting: Physician Assistant

## 2022-09-25 ENCOUNTER — Other Ambulatory Visit: Payer: Self-pay

## 2022-09-25 ENCOUNTER — Inpatient Hospital Stay: Payer: Medicaid Other

## 2022-09-25 ENCOUNTER — Inpatient Hospital Stay (HOSPITAL_BASED_OUTPATIENT_CLINIC_OR_DEPARTMENT_OTHER): Payer: Medicaid Other | Admitting: Hematology and Oncology

## 2022-09-25 ENCOUNTER — Encounter: Payer: Self-pay | Admitting: Hematology and Oncology

## 2022-09-25 VITALS — BP 125/88 | HR 87 | Temp 98.4°F | Resp 18 | Ht 68.0 in | Wt 303.6 lb

## 2022-09-25 DIAGNOSIS — D472 Monoclonal gammopathy: Secondary | ICD-10-CM

## 2022-09-25 DIAGNOSIS — N183 Chronic kidney disease, stage 3 unspecified: Secondary | ICD-10-CM | POA: Diagnosis not present

## 2022-09-25 DIAGNOSIS — Z23 Encounter for immunization: Secondary | ICD-10-CM

## 2022-09-25 MED ORDER — INFLUENZA VAC SPLIT QUAD 0.5 ML IM SUSY
0.5000 mL | PREFILLED_SYRINGE | Freq: Once | INTRAMUSCULAR | Status: AC
  Administered 2022-09-25: 0.5 mL via INTRAMUSCULAR
  Filled 2022-09-25: qty 0.5

## 2022-09-25 NOTE — Assessment & Plan Note (Signed)
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 I discussed with her the natural history of MGUS. Recent blood work showed no significant signs of disease progression She would need close follow-up once a year with history, blood work and examination. She agreed

## 2022-09-25 NOTE — Assessment & Plan Note (Signed)
She has multiple risk factors for chronic kidney disease We discussed the importance of her taking her medications, hydration and risk factor modification The elevated creatinine is not related to MGUS I recommend increase oral fluid hydration

## 2022-09-25 NOTE — Progress Notes (Signed)
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 I discussed with her the natural history of MGUS. Recent blood work showed no significant signs of disease progression She would need close follow-up once a year with history, blood work and examination. She agreed  CKD (chronic kidney disease), stage III ([place]) She has multiple risk factors for chronic kidney disease We discussed the importance of her taking her medications, hydration and risk factor modification The elevated creatinine is not related to MGUS I recommend increase oral fluid hydration  No orders of the defined types were placed in this encounter.   All questions were answered. The patient knows to call the clinic with any problems, questions or concerns. The total time spent in the appointment was 20 minutes encounter with patients including review of chart and various tests results, discussions about plan of care and coordination of care plan   Alexandra Lark, MD 09/25/2022 11:28 AM  INTERVAL HISTORY: Please see below for problem oriented charting. she returns for surveillance follow-up for MGUS She is doing well She took recent prednisone for flare of joint pain No recent fever or infection  REVIEW OF SYSTEMS:   Constitutional: Denies fevers, chills or abnormal weight loss Eyes: Denies blurriness of vision Ears, nose, mouth, throat, and face: Denies mucositis or sore throat Respiratory: Denies cough, dyspnea or wheezes Cardiovascular: Denies palpitation, chest discomfort or lower extremity swelling Gastrointestinal:  Denies nausea, heartburn or change in bowel habits Skin: Denies abnormal skin rashes Lymphatics: Denies new lymphadenopathy or easy  bruising Neurological:Denies numbness, tingling or new weaknesses Behavioral/Psych: Mood is stable, no new changes  All other systems were reviewed with the patient and are negative.  I have reviewed the past medical history, past surgical history, social history and family history with the patient and they are unchanged from previous note.  ALLERGIES:  has No Known Allergies.  MEDICATIONS:  Current Outpatient Medications  Medication Sig Dispense Refill   amLODipine (NORVASC) 10 MG tablet Take 1 tablet (10 mg total) by mouth daily. Needs office visit for more refills 90 tablet 3   baclofen (LIORESAL) 10 MG tablet Take 10 mg by mouth 3 (three) times daily as needed.     Etanercept (ENBREL MINI) 50 MG/ML SOCT Inject 50 mg into the skin once a week. First dose in clinic with sample on 08/31/22 12 mL 0   lisinopril-hydrochlorothiazide (ZESTORETIC) 20-12.5 MG tablet Take 2 tablets by mouth daily.     sulfaSALAzine (AZULFIDINE) 500 MG tablet Take 2 tablets by mouth twice daily 360 tablet 0   Vitamin D, Ergocalciferol, (DRISDOL) 1.25 MG (50000 UNIT) CAPS capsule Take 1 capsule (50,000 Units total) by mouth every 7 (seven) days. 5 capsule 5   No current facility-administered medications for this visit.    SUMMARY OF ONCOLOGIC HISTORY:  Alexandra Powers is here because of chronic joint pain and abnormal protein electrophoresis. The patient had problems with chronic back pain. She denies prior history of trauma. The pain has been present for over a year. She was prescribed intermittent doses of NSAID and Vicodin for this. The pains is located in the shoulder, the back, and the knees with associated morning stiffness. The patient had cholecystectomy several months ago and since then had postprandial nausea, vomiting usually within  30 minutes and associated diarrhea 4-5 times a day with abdominal cramps. She have lost almost 40 pounds over the past few months. She was subsequently referred to  rheumatologist. On 05/20/2016, blood work show high sedimentation rate of 67, C-reactive protein of 11.9, abnormal serum protein electrophoresis with detectable M spike but negative autoimmune screen with ANA. Hepatitis screen was negative. She denies history of abnormal bone fracture. Patient denies history of recurrent infection or atypical infections such as shingles of meningitis. Denies chills, night sweats or anorexia  In August 2017, she underwent extensive workup which excluded multiple myeloma. X-ray is most consistent with osteoarthritis and she have IgG lambda MGUS with polyclonal IgA   PHYSICAL EXAMINATION: ECOG PERFORMANCE STATUS: 1 - Symptomatic but completely ambulatory  Vitals:   09/25/22 0848  BP: 125/88  Pulse: 87  Resp: 18  Temp: 98.4 F (36.9 C)  SpO2: 100%   Filed Weights   09/25/22 0848  Weight: (!) 303 lb 9.6 oz (137.7 kg)    GENERAL:alert, no distress and comfortable NEURO: alert & oriented x 3 with fluent speech, no focal motor/sensory deficits  LABORATORY DATA:  I have reviewed the data as listed    Component Value Date/Time   NA 142 09/18/2022 0825   NA 143 04/24/2021 1223   NA 142 08/05/2017 1057   K 3.5 09/18/2022 0825   K 2.7 (LL) 08/05/2017 1057   CL 106 09/18/2022 0825   CO2 31 09/18/2022 0825   CO2 33 (H) 08/05/2017 1057   GLUCOSE 78 09/18/2022 0825   GLUCOSE 103 08/05/2017 1057   BUN 23 (H) 09/18/2022 0825   BUN 14 04/24/2021 1223   BUN 31.2 (H) 08/05/2017 1057   CREATININE 1.20 (H) 09/18/2022 0825   CREATININE 0.92 08/14/2022 1003   CREATININE 1.8 (H) 08/05/2017 1057   CALCIUM 9.2 09/18/2022 0825   CALCIUM 10.4 08/05/2017 1057   PROT 7.7 09/18/2022 0825   PROT 7.5 04/24/2021 1223   PROT 8.3 08/05/2017 1057   ALBUMIN 3.8 09/18/2022 0825   ALBUMIN 4.1 04/24/2021 1223   ALBUMIN 3.2 (L) 08/05/2017 1057   AST 12 (L) 09/18/2022 0825   AST 18 08/05/2017 1057   ALT 7 09/18/2022 0825   ALT 10 08/05/2017 1057   ALKPHOS 57 09/18/2022  0825   ALKPHOS 62 08/05/2017 1057   BILITOT 0.5 09/18/2022 0825   BILITOT 0.4 04/24/2021 1223   BILITOT 0.46 08/05/2017 1057   GFRNONAA 54 (L) 09/18/2022 0825   GFRNONAA 58 (L) 07/06/2019 1010   GFRAA >60 09/02/2020 1051   GFRAA 68 07/06/2019 1010    No results found for: "SPEP", "UPEP"  Lab Results  Component Value Date   WBC 13.1 (H) 09/18/2022   NEUTROABS 6.0 09/18/2022   HGB 12.1 09/18/2022   HCT 36.4 09/18/2022   MCV 104.6 (H) 09/18/2022   PLT 258 09/18/2022      Chemistry      Component Value Date/Time   NA 142 09/18/2022 0825   NA 143 04/24/2021 1223   NA 142 08/05/2017 1057   K 3.5 09/18/2022 0825   K 2.7 (LL) 08/05/2017 1057   CL 106 09/18/2022 0825   CO2 31 09/18/2022 0825   CO2 33 (H) 08/05/2017 1057   BUN 23 (H) 09/18/2022 0825   BUN 14 04/24/2021 1223   BUN 31.2 (H) 08/05/2017 1057   CREATININE 1.20 (H) 09/18/2022 0825   CREATININE 0.92 08/14/2022 1003   CREATININE 1.8 (H) 08/05/2017 1057      Component Value  Date/Time   CALCIUM 9.2 09/18/2022 0825   CALCIUM 10.4 08/05/2017 1057   ALKPHOS 57 09/18/2022 0825   ALKPHOS 62 08/05/2017 1057   AST 12 (L) 09/18/2022 0825   AST 18 08/05/2017 1057   ALT 7 09/18/2022 0825   ALT 10 08/05/2017 1057   BILITOT 0.5 09/18/2022 0825   BILITOT 0.4 04/24/2021 1223   BILITOT 0.46 08/05/2017 1057

## 2022-09-25 NOTE — Patient Instructions (Signed)

## 2022-09-28 ENCOUNTER — Other Ambulatory Visit (HOSPITAL_COMMUNITY): Payer: Self-pay

## 2022-09-28 NOTE — Progress Notes (Signed)
Office Visit Note  Patient: Alexandra Powers             Date of Birth: 1971-11-28           MRN: [id number]             PCP: Vevelyn Francois, NP Referring: Vevelyn Francois, NP Visit Date: 10/12/2022 Occupation: '@GUAROCC'$ @  Subjective:  Medication monitoring-enbrel   History of Present Illness: Alexandra Powers is a 51 y.o. female with history of seropositive rheumatoid arthritis and osteoarthritis.  Patient is currently on Enbrel 50 mg sq injections once weekly and Sulfasalazine 500 mg 2 tablets by mouth twice daily.  She was initiated on Enbrel on 08/31/2022.  She has been tolerating Enbrel without any side effects or injection site reactions.  She has not missed any doses of Enbrel and has been doing her weekly injections on Mondays.  She plans on doing her next dose today.  She reports that she has noticed over a 50% improvement in her symptoms.  She has been able to use her left wrist to perform ADLs which has been a huge improvement.  The functionality and range of motion in her left wrist have improved.  She currently rates the pain in her left hand and wrist a 1 out of 10 which is with activity.  She has ongoing chronic pain in both knee joints but denies any other joint pain or joint swelling at this time.  She uses a rollator walker to assist with ambulation.  She plans to continue to try to work on weight loss in order to proceed with a knee replacement in the future. She denies any recent or recurrent infections.  She received the annual flu shot. Patient reports that she also establish care with dermatology at [facility] and is planning on going on a yearly basis as advised while on Enbrel for skin cancer screening.      Activities of Daily Living:  Patient reports morning stiffness for 1 hour.   Patient Denies nocturnal pain.  Difficulty dressing/grooming: Reports Difficulty climbing stairs: Reports Difficulty getting out of chair: Reports Difficulty using hands for taps, buttons,  cutlery, and/or writing: Reports  Review of Systems  Constitutional:  Positive for fatigue.  HENT:  Negative for mouth sores and mouth dryness.   Eyes:  Negative for dryness.  Respiratory:  Negative for shortness of breath.   Cardiovascular:  Negative for chest pain and palpitations.  Gastrointestinal:  Negative for blood in stool, constipation and diarrhea.  Endocrine: Negative for increased urination.  Genitourinary:  Negative for involuntary urination.  Musculoskeletal:  Positive for joint pain, gait problem, joint pain, joint swelling, myalgias, muscle weakness, morning stiffness, muscle tenderness and myalgias.  Skin:  Negative for color change, rash, hair loss and sensitivity to sunlight.  Allergic/Immunologic: Negative for susceptible to infections.  Neurological:  Negative for dizziness and headaches.  Hematological:  Negative for swollen glands.  Psychiatric/Behavioral:  Negative for depressed mood and sleep disturbance. The patient is not nervous/anxious.     PMFS History:  Patient Active Problem List   Diagnosis Date Noted   CKD (chronic kidney disease), stage III ([place]) 09/09/2020   Preventive measure 09/09/2020   CKD (chronic kidney disease), stage II 09/08/2019   Trochanteric bursitis of left hip 09/08/2019   Anxiety and depression 09/01/2019   Chronic pain syndrome 08/08/2019   Chronic myofascial pain 08/08/2019   Bilateral breast cysts 08/01/2019   Cellulitis of left breast 07/14/2019   Other  rheumatoid arthritis with rheumatoid factor of left hip (HCC) 07/14/2019   Primary osteoarthritis of both hips 07/29/2018   Primary osteoarthritis of both knees 07/29/2018   Primary osteoarthritis of both feet 07/29/2018   Tonsillitis 05/02/2018   Acute prerenal failure ([place]) 09/03/2017   OSA (obstructive sleep apnea) 12/10/2016   GERD (gastroesophageal reflux disease) 12/04/2016   Low back pain at multiple sites 08/21/2016   Chronic pain of multiple joints 08/13/2016    MGUS (monoclonal gammopathy of unknown significance) 07/02/2016   Vitamin D deficiency 07/02/2016   HTN (hypertension), benign 11/11/2015   Morbid obesity ([place]) 11/11/2015   Adrenal mass ([place]) 08/30/2015   Ovarian cyst 08/30/2015    Past Medical History:  Diagnosis Date   Arthritis    Gallstones    GERD (gastroesophageal reflux disease)    Hypertension    Mass of adrenal gland (HCC)    MGUS (monoclonal gammopathy of unknown significance)    Obesity    Osteoarthritis    PONV (postoperative nausea and vomiting)    Rheumatoid arthritis (HCC)    Sleep apnea    SENT CPAP BACK, NOT WORN IN 7-8 MONTHS    Family History  Problem Relation Age of Onset   Arthritis Mother    High blood pressure Mother    Sleep apnea Mother    High blood pressure Father    Breast cancer Paternal Aunt    Arthritis Sister    Healthy Daughter    Healthy Son    Adrenal disorder Neg Hx    Colon cancer Neg Hx    Esophageal cancer Neg Hx    Pancreatic cancer Neg Hx    Stomach cancer Neg Hx    Liver disease Neg Hx    Past Surgical History:  Procedure Laterality Date   BREAST DUCTAL SYSTEM EXCISION Bilateral 10/12/2017   Procedure: EXCISION OF BILATERAL CENTRAL DUCT;  Surgeon: Erroll Luna, MD;  Location: [place];  Service: General;  Laterality: Bilateral;   BREAST EXCISIONAL BIOPSY     CHOLECYSTECTOMY N/A 12/21/2015   Procedure: LAPAROSCOPIC CHOLECYSTECTOMY;  Surgeon: Georganna Skeans, MD;  Location: [place];  Service: General;  Laterality: N/A;   UMBILICAL HERNIA REPAIR N/A 12/27/2015   Procedure: INCARCERATED UMBILICAL HERNIA REPAIR;  Surgeon: Coralie Keens, MD;  Location: [place];  Service: General;  Laterality: N/A;   Social History   Social History Narrative   Lives with husband, Berneta Sages.   Immunization History  Administered Date(s) Administered   Influenza,inj,Quad PF,6+ Mos 08/29/2015, 08/21/2016, 08/31/2017, 08/09/2018, 08/01/2019, 09/09/2020, 09/25/2022   Influenza,inj,quad, With Preservative  08/30/2017   PFIZER(Purple Top)SARS-COV-2 Vaccination 02/16/2020, 03/08/2020, 12/07/2020   Tdap 08/29/2015     Objective: Vital Signs: BP 106/69 (BP Location: Right Wrist, Patient Position: Sitting, Cuff Size: Normal)   Pulse 92   Ht '5\' 8"'$  (1.727 m)   Wt (!) 303 lb 12.8 oz (137.8 kg)   LMP 04/16/2019   BMI 46.19 kg/m    Physical Exam Vitals and nursing note reviewed.  Constitutional:      Appearance: She is well-developed.  HENT:     Head: Normocephalic and atraumatic.  Eyes:     Conjunctiva/sclera: Conjunctivae normal.  Cardiovascular:     Rate and Rhythm: Normal rate and regular rhythm.     Heart sounds: Normal heart sounds.  Pulmonary:     Effort: Pulmonary effort is normal.     Breath sounds: Normal breath sounds.  Abdominal:     General: Bowel sounds are normal.     Palpations:  Abdomen is soft.  Musculoskeletal:     Cervical back: Normal range of motion.  Skin:    General: Skin is warm and dry.     Capillary Refill: Capillary refill takes less than 2 seconds.  Neurological:     Mental Status: She is alert and oriented to person, place, and time.  Psychiatric:        Behavior: Behavior normal.      Musculoskeletal Exam: Patient remained seated in a rollator walker during the examination today.  C-spine has good range of motion.  No midline spinal tenderness.  Shoulder joints have good range of motion with no tenderness upon palpation.  Elbow joints have good range of motion.  Left wrist joint has mild tenderness but no synovitis or extensor tenosynovitis noted.  No tenderness or synovitis over MCP or PIP joints.  Complete fist formation bilaterally.  Hip joints difficult to assess in seated position.  Painful range of motion of both knee joints with warmth but no effusion.  Ankle joints have good range of motion with no tenderness or joint swelling.  CDAI Exam: CDAI Score: 1.2  Patient Global: 1 mm; Provider Global: 1 mm Swollen: 0 ; Tender: 1  Joint Exam  10/12/2022      Right  Left  Wrist      Tender     Investigation: No additional findings.  Imaging: No results found.  Recent Labs: Lab Results  Component Value Date   WBC 13.1 (H) 09/18/2022   HGB 12.1 09/18/2022   PLT 258 09/18/2022   NA 142 09/18/2022   K 3.5 09/18/2022   CL 106 09/18/2022   CO2 31 09/18/2022   GLUCOSE 78 09/18/2022   BUN 23 (H) 09/18/2022   CREATININE 1.20 (H) 09/18/2022   BILITOT 0.5 09/18/2022   ALKPHOS 57 09/18/2022   AST 12 (L) 09/18/2022   ALT 7 09/18/2022   PROT 7.7 09/18/2022   ALBUMIN 3.8 09/18/2022   CALCIUM 9.2 09/18/2022   GFRAA >60 09/02/2020   QFTBGOLDPLUS NEGATIVE 08/14/2022    Speciality Comments: PLQ Eye Exam: 08/12/18 WNL @ [facility] Follow up in 1 year.  ENbrel started 08/31/22  Procedures:  No procedures performed Allergies: Patient has no known allergies.   Assessment / Plan:     Visit Diagnoses: Rheumatoid arthritis involving multiple sites with positive rheumatoid factor (HCC) - Positive RF, positive anti-CCP, positive elevated sedimentation rate, positive synovitis on ultrasound: She has no synovitis on examination today.  The extensor tenosynovitis of the left wrist has resolved.  She is currently on Enbrel 50 mg subcutaneous injections once weekly and sulfasalazine 500 mg 2 tablets by mouth twice daily.  She was initiated on Enbrel on 08/31/2022.  She has been tolerating Enbrel without any side effects or injection site reactions.  She has not missed any doses of Enbrel recently.  She has not had any recent or recurrent infections since initiating therapy.  She has noticed over 50% improvement in her joint pain, swelling, and range of motion in the left wrist joint since initiating Enbrel.  She currently rates her pain a 1 out of 10 in the left wrist with activity.  Her quality of life has improved significantly since initiating Enbrel.  No medication changes will be made at this time.  She will follow-up in the office in 2  to 3 months or sooner if needed.  High risk medication use - Enbrel 50 mg sq injections once weekly, Sulfasalazine 500 mg 2 tablets by mouth  twice daily.  - Plan: COMPLETE METABOLIC PANEL WITH GFR, CBC with Differential/Platelet, CBC with Differential/Platelet, COMPLETE METABOLIC PANEL WITH GFR Patient initiated Enbrel on 08/31/2022. CBC and CMP drawn on 09/18/2022.  Orders for CBC and CMP were released today.  Her next lab work will be due in February and every 3 months to monitor for drug toxicity.  Standing orders for CBC and CMP were placed today.  TB Gold negative on 08/14/2022. She has not had any recent or recurrent infections.  Discussed the importance of holding Enbrel and sulfasalazine if she develops signs or symptoms of an infection and to resume once the infection has completely cleared. She has received the annual flu shot. She has established care with dermatology at [facility].  She plans on following up with her dermatologist on a yearly basis for skin cancer screening while on Enbrel.  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  Underwent treatment with rifampin/INH for 3 months prescribed at the health department.  Treatment was completed in January 2020.  Primary osteoarthritis of both hands: She is not experiencing any tenderness or inflammation in the PIP or DIP joints.  Complete fist formation was noted bilaterally.  Pain in left wrist: Improving.  Currently rates her pain a 1/10.  Her range of motion and functionality of the left wrist has improved significantly since initiating Enbrel.  She has been able to perform ADLs without difficulty.  On examination the extensor tenosynovitis of the left wrist has resolved.  She will remain on Enbrel and sulfasalazine as combination therapy.  Primary osteoarthritis of both hips: She has been experiencing increased pain in the left groin intermittently.  She has been using a rollator walker to assist with ambulation.  She plans on  following up with orthopedics for further evaluation.  Trochanteric bursitis, left hip: She has some tenderness over the left trochanteric bursa.  Primary osteoarthritis of both knees: Chronic pain.  She plans on continuing to work on weight loss in order to proceed with knee replacements in the future.  She is using a rollator walker to assist with ambulation.  Primary osteoarthritis of both feet: She is not experiencing any increased discomfort in her feet at this time.  She wears proper fitting shoes.  Other medical conditions are listed as follows:  HTN (hypertension), benign: Blood pressure was 106/69 today in the office.  Gastroesophageal reflux disease, unspecified whether esophagitis present  MGUS (monoclonal gammopathy of unknown significance): Evaluated by Dr. Alvy Bimler on 09/25/22-planning yearly follow up.   Vitamin D deficiency: She is taking vitamin D 50,000 units once weekly.  OSA (obstructive sleep apnea)  Orders: Orders Placed This Encounter  Procedures   COMPLETE METABOLIC PANEL WITH GFR   CBC with Differential/Platelet   CBC with Differential/Platelet   COMPLETE METABOLIC PANEL WITH GFR   No orders of the defined types were placed in this encounter.   Follow-Up Instructions: Return in about 3 months (around 01/12/2023) for Rheumatoid arthritis, Osteoarthritis.   Ofilia Neas, PA-C  Note - This record has been created using Dragon software.  Chart creation errors have been sought, but may not always  have been located. Such creation errors do not reflect on  the standard of medical care.

## 2022-09-29 ENCOUNTER — Other Ambulatory Visit: Payer: Self-pay | Admitting: Rheumatology

## 2022-09-29 NOTE — Telephone Encounter (Signed)
Next Visit: 10/12/2022  Last Visit: 08/14/2022  Last Fill: 07/02/2022  DX: Rheumatoid arthritis involving multiple sites with positive rheumatoid factor   Current Dose per office note 08/14/2022: Sulfasalazine 500 mg 2 tablets by mouth twice daily.   Labs: 09/18/2022 WBC 13.1, RBC 3.48, MCV 104.6, MCH 34.8, Lymphs Abs 6.1, BUN 23, Creat. 1.20, GFR 54, AST 12   Okay to refill SSZ?

## 2022-09-30 ENCOUNTER — Other Ambulatory Visit (HOSPITAL_COMMUNITY): Payer: Self-pay

## 2022-10-12 ENCOUNTER — Encounter: Payer: Self-pay | Admitting: Physician Assistant

## 2022-10-12 ENCOUNTER — Ambulatory Visit: Payer: Medicaid Other | Attending: Physician Assistant | Admitting: Physician Assistant

## 2022-10-12 VITALS — BP 106/69 | HR 92 | Ht 68.0 in | Wt 303.8 lb

## 2022-10-12 DIAGNOSIS — M25532 Pain in left wrist: Secondary | ICD-10-CM

## 2022-10-12 DIAGNOSIS — M19041 Primary osteoarthritis, right hand: Secondary | ICD-10-CM

## 2022-10-12 DIAGNOSIS — M170 Bilateral primary osteoarthritis of knee: Secondary | ICD-10-CM

## 2022-10-12 DIAGNOSIS — Z79899 Other long term (current) drug therapy: Secondary | ICD-10-CM | POA: Diagnosis not present

## 2022-10-12 DIAGNOSIS — M0579 Rheumatoid arthritis with rheumatoid factor of multiple sites without organ or systems involvement: Secondary | ICD-10-CM

## 2022-10-12 DIAGNOSIS — M160 Bilateral primary osteoarthritis of hip: Secondary | ICD-10-CM

## 2022-10-12 DIAGNOSIS — I10 Essential (primary) hypertension: Secondary | ICD-10-CM

## 2022-10-12 DIAGNOSIS — D472 Monoclonal gammopathy: Secondary | ICD-10-CM

## 2022-10-12 DIAGNOSIS — R7612 Nonspecific reaction to cell mediated immunity measurement of gamma interferon antigen response without active tuberculosis: Secondary | ICD-10-CM

## 2022-10-12 DIAGNOSIS — E559 Vitamin D deficiency, unspecified: Secondary | ICD-10-CM

## 2022-10-12 DIAGNOSIS — M19071 Primary osteoarthritis, right ankle and foot: Secondary | ICD-10-CM

## 2022-10-12 DIAGNOSIS — G4733 Obstructive sleep apnea (adult) (pediatric): Secondary | ICD-10-CM

## 2022-10-12 DIAGNOSIS — M19042 Primary osteoarthritis, left hand: Secondary | ICD-10-CM

## 2022-10-12 DIAGNOSIS — K219 Gastro-esophageal reflux disease without esophagitis: Secondary | ICD-10-CM

## 2022-10-12 DIAGNOSIS — M7062 Trochanteric bursitis, left hip: Secondary | ICD-10-CM

## 2022-10-12 DIAGNOSIS — M19072 Primary osteoarthritis, left ankle and foot: Secondary | ICD-10-CM

## 2022-10-13 LAB — COMPLETE METABOLIC PANEL WITH GFR
AG Ratio: 1.2 (calc) (ref 1.0–2.5)
ALT: 7 U/L (ref 6–29)
AST: 13 U/L (ref 10–35)
Albumin: 3.8 g/dL (ref 3.6–5.1)
Alkaline phosphatase (APISO): 64 U/L (ref 37–153)
BUN/Creatinine Ratio: 18 (calc) (ref 6–22)
BUN: 20 mg/dL (ref 7–25)
CO2: 28 mmol/L (ref 20–32)
Calcium: 9.2 mg/dL (ref 8.6–10.4)
Chloride: 104 mmol/L (ref 98–110)
Creat: 1.12 mg/dL — ABNORMAL HIGH (ref 0.50–1.03)
Globulin: 3.3 g/dL (calc) (ref 1.9–3.7)
Glucose, Bld: 95 mg/dL (ref 65–139)
Potassium: 3.6 mmol/L (ref 3.5–5.3)
Sodium: 140 mmol/L (ref 135–146)
Total Bilirubin: 0.4 mg/dL (ref 0.2–1.2)
Total Protein: 7.1 g/dL (ref 6.1–8.1)
eGFR: 59 mL/min/{1.73_m2} — ABNORMAL LOW (ref >=60)

## 2022-10-13 LAB — CBC WITH DIFFERENTIAL/PLATELET
Absolute Monocytes: 726 cells/uL (ref 200–950)
Basophils Absolute: 74 cells/uL (ref 0–200)
Basophils Relative: 0.6 %
Eosinophils Absolute: 246 cells/uL (ref 15–500)
Eosinophils Relative: 2 %
HCT: 35.5 % (ref 35.0–45.0)
Hemoglobin: 12.1 g/dL (ref 11.7–15.5)
Lymphs Abs: 4711 cells/uL — ABNORMAL HIGH (ref 850–3900)
MCH: 35.1 pg — ABNORMAL HIGH (ref 27.0–33.0)
MCHC: 34.1 g/dL (ref 32.0–36.0)
MCV: 102.9 fL — ABNORMAL HIGH (ref 80.0–100.0)
MPV: 10.5 fL (ref 7.5–12.5)
Monocytes Relative: 5.9 %
Neutro Abs: 6544 cells/uL (ref 1500–7800)
Neutrophils Relative %: 53.2 %
Platelets: 324 10*3/uL (ref 140–400)
RBC: 3.45 10*6/uL — ABNORMAL LOW (ref 3.80–5.10)
RDW: 12.2 % (ref 11.0–15.0)
Total Lymphocyte: 38.3 %
WBC: 12.3 10*3/uL — ABNORMAL HIGH (ref 3.8–10.8)

## 2022-10-13 NOTE — Progress Notes (Signed)
CBC stable.  Creatinine is elevated-1.12-trending down and GFR is borderline low-59.  Avoid NSAID use.

## 2022-10-20 ENCOUNTER — Other Ambulatory Visit (HOSPITAL_COMMUNITY): Payer: Self-pay

## 2022-10-27 ENCOUNTER — Other Ambulatory Visit (HOSPITAL_COMMUNITY): Payer: Self-pay

## 2022-11-12 ENCOUNTER — Other Ambulatory Visit (HOSPITAL_COMMUNITY): Payer: Self-pay

## 2022-11-13 ENCOUNTER — Other Ambulatory Visit: Payer: Self-pay | Admitting: Physician Assistant

## 2022-11-13 ENCOUNTER — Other Ambulatory Visit (HOSPITAL_COMMUNITY): Payer: Self-pay

## 2022-11-13 DIAGNOSIS — M0579 Rheumatoid arthritis with rheumatoid factor of multiple sites without organ or systems involvement: Secondary | ICD-10-CM

## 2022-11-13 DIAGNOSIS — Z79899 Other long term (current) drug therapy: Secondary | ICD-10-CM

## 2022-11-13 MED ORDER — ENBREL MINI 50 MG/ML ~~LOC~~ SOCT
50.0000 mg | SUBCUTANEOUS | 0 refills | Status: DC
  Filled 2022-11-13: qty 4, 28d supply, fill #0
  Filled 2022-12-16: qty 4, 28d supply, fill #1
  Filled 2023-01-14: qty 4, 28d supply, fill #2

## 2022-11-13 NOTE — Telephone Encounter (Signed)
Next Visit: 01/19/2023  Last Visit: 10/12/2022  Last Fill: 08/31/2022  DX: Rheumatoid arthritis involving multiple sites with positive rheumatoid factor   Current Dose per office note 10/12/2022: Enbrel 50 mg sq injections once weekly   Labs: 10/12/2022 CBC stable.Creatinine is elevated-1.12-trending down and GFR is borderline low-59.   TB Gold: 08/14/2022 Neg    Okay to refill Enbrel?

## 2022-11-24 ENCOUNTER — Other Ambulatory Visit (HOSPITAL_COMMUNITY): Payer: Self-pay

## 2022-12-16 ENCOUNTER — Other Ambulatory Visit (HOSPITAL_COMMUNITY): Payer: Self-pay

## 2022-12-22 ENCOUNTER — Other Ambulatory Visit: Payer: Self-pay | Admitting: Physician Assistant

## 2022-12-22 ENCOUNTER — Other Ambulatory Visit: Payer: Self-pay

## 2022-12-22 NOTE — Telephone Encounter (Signed)
Next Visit: 01/20/2023   Last Visit: 10/12/2022   Last Fill: 09/29/2022  DX: Rheumatoid arthritis involving multiple sites with positive rheumatoid factor    Current Dose per office note 10/12/2022: sulfasalazine 500 mg 2 tablets by mouth twice daily.   Labs: 10/12/2022 CBC stable.Creatinine is elevated-1.12-trending down and GFR is borderline low-59.    Okay to refill SSZ?

## 2022-12-28 ENCOUNTER — Other Ambulatory Visit (HOSPITAL_COMMUNITY): Payer: Self-pay

## 2023-01-06 NOTE — Progress Notes (Signed)
Office Visit Note  Patient: Alexandra Powers             Date of Birth: 07-21-1971           MRN: [id number]             PCP: Vevelyn Francois, NP Referring: Vevelyn Francois, NP Visit Date: 01/20/2023 Occupation: @GUAROCC$ @  Subjective:  Medication maintenance   History of Present Illness: Alexandra Powers is a 52 y.o. female with history of seropositive rheumatoid arthritis and osteoarthritis. She is on Enbrel 50 mg injections once a week and sulfasalazine 100 mg BID. She was sick a month ago and she missed a week of medications. She didn't have any flares during this period. She rates her rheumatoid arthritis as a 3/10 today.  Patient reports that her left wrist has some swelling and pain. She also has pain in her knees bilaterally. She uses topical gels as needed for relief. She ambulates with a walker for balance and knee pain. She follows up with Eulogio Bear in dermatology annually for skin examinations.   Activities of Daily Living:  Patient reports morning stiffness for 35-40 minutes.   Patient Reports nocturnal pain.  Difficulty dressing/grooming: Reports Difficulty climbing stairs: Reports Difficulty getting out of chair: Reports Difficulty using hands for taps, buttons, cutlery, and/or writing: Reports  Review of Systems  Constitutional:  Positive for fatigue.  HENT:  Negative for mouth sores and mouth dryness.   Eyes:  Negative for dryness.  Respiratory:  Negative for shortness of breath.   Cardiovascular:  Negative for chest pain and palpitations.  Gastrointestinal:  Negative for blood in stool, constipation and diarrhea.  Endocrine: Negative for increased urination.  Genitourinary:  Negative for involuntary urination.  Musculoskeletal:  Positive for joint pain, joint pain, joint swelling and morning stiffness. Negative for gait problem, myalgias, muscle weakness, muscle tenderness and myalgias.  Skin:  Negative for color change, rash, hair loss and sensitivity to sunlight.   Allergic/Immunologic: Positive for susceptible to infections.  Neurological:  Negative for dizziness and headaches.  Hematological:  Negative for swollen glands.  Psychiatric/Behavioral:  Negative for depressed mood and sleep disturbance. The patient is not nervous/anxious.     PMFS History:  Patient Active Problem List   Diagnosis Date Noted   CKD (chronic kidney disease), stage III ([place]) 09/09/2020   Preventive measure 09/09/2020   CKD (chronic kidney disease), stage II 09/08/2019   Trochanteric bursitis of left hip 09/08/2019   Anxiety and depression 09/01/2019   Chronic pain syndrome 08/08/2019   Chronic myofascial pain 08/08/2019   Bilateral breast cysts 08/01/2019   Cellulitis of left breast 07/14/2019   Other rheumatoid arthritis with rheumatoid factor of left hip ([place]) 07/14/2019   Primary osteoarthritis of both hips 07/29/2018   Primary osteoarthritis of both knees 07/29/2018   Primary osteoarthritis of both feet 07/29/2018   Tonsillitis 05/02/2018   Acute prerenal failure ([place]) 09/03/2017   OSA (obstructive sleep apnea) 12/10/2016   GERD (gastroesophageal reflux disease) 12/04/2016   Low back pain at multiple sites 08/21/2016   Chronic pain of multiple joints 08/13/2016   MGUS (monoclonal gammopathy of unknown significance) 07/02/2016   Vitamin D deficiency 07/02/2016   HTN (hypertension), benign 11/11/2015   Morbid obesity ([place]) 11/11/2015   Adrenal mass ([name]) 08/30/2015   Ovarian cyst 08/30/2015    Past Medical History:  Diagnosis Date   Arthritis    Gallstones    GERD (gastroesophageal reflux disease)    Hypertension  Mass of adrenal gland (HCC)    MGUS (monoclonal gammopathy of unknown significance)    Obesity    Osteoarthritis    PONV (postoperative nausea and vomiting)    Rheumatoid arthritis (HCC)    Sleep apnea    SENT CPAP BACK, NOT WORN IN 7-8 MONTHS    Family History  Problem Relation Age of Onset   Arthritis Mother    High blood pressure  Mother    Sleep apnea Mother    High blood pressure Father    Breast cancer Paternal Aunt    Arthritis Sister    Healthy Daughter    Healthy Son    Adrenal disorder Neg Hx    Colon cancer Neg Hx    Esophageal cancer Neg Hx    Pancreatic cancer Neg Hx    Stomach cancer Neg Hx    Liver disease Neg Hx    Past Surgical History:  Procedure Laterality Date   BREAST DUCTAL SYSTEM EXCISION Bilateral 10/12/2017   Procedure: EXCISION OF BILATERAL CENTRAL DUCT;  Surgeon: Erroll Luna, MD;  Location: [facility];  Service: General;  Laterality: Bilateral;   BREAST EXCISIONAL BIOPSY     CHOLECYSTECTOMY N/A 12/21/2015   Procedure: LAPAROSCOPIC CHOLECYSTECTOMY;  Surgeon: Georganna Skeans, MD;  Location: [facility];  Service: General;  Laterality: N/A;   UMBILICAL HERNIA REPAIR N/A 12/27/2015   Procedure: INCARCERATED UMBILICAL HERNIA REPAIR;  Surgeon: Coralie Keens, MD;  Location: [facility];  Service: General;  Laterality: N/A;   Social History   Social History Narrative   Lives with husband, Berneta Sages.   Immunization History  Administered Date(s) Administered   Influenza,inj,Quad PF,6+ Mos 08/29/2015, 08/21/2016, 08/31/2017, 08/09/2018, 08/01/2019, 09/09/2020, 09/25/2022   Influenza,inj,quad, With Preservative 08/30/2017   PFIZER(Purple Top)SARS-COV-2 Vaccination 02/16/2020, 03/08/2020, 12/07/2020   Tdap 08/29/2015     Objective: Vital Signs: BP 101/71 (BP Location: Right Arm, Patient Position: Sitting, Cuff Size: Normal)   Pulse 100   Resp 16   Ht 5' 8"$  (1.727 m)   Wt 293 lb (132.9 kg)   LMP 04/16/2019   BMI 44.55 kg/m    Physical Exam Vitals and nursing note reviewed.  Constitutional:      Appearance: She is well-developed.  HENT:     Head: Normocephalic and atraumatic.  Eyes:     Conjunctiva/sclera: Conjunctivae normal.  Cardiovascular:     Rate and Rhythm: Normal rate and regular rhythm.     Heart sounds: Normal heart sounds.  Pulmonary:     Effort: Pulmonary effort is normal.      Breath sounds: Normal breath sounds.  Abdominal:     General: Bowel sounds are normal.     Palpations: Abdomen is soft.  Musculoskeletal:     Cervical back: Normal range of motion.  Lymphadenopathy:     Cervical: No cervical adenopathy.  Skin:    General: Skin is warm and dry.     Capillary Refill: Capillary refill takes less than 2 seconds.  Neurological:     Mental Status: She is alert and oriented to person, place, and time.  Psychiatric:        Behavior: Behavior normal.      Musculoskeletal Exam: Cervical spine had good range of motion. Shoulder joints, elbow joints, wrist joints, MCPs PIPs and DIPs have good range of motion with no synovitis, warmth or effusion. Hip joints, knee joints, ankles, MTPs and PIPs with good range of motion with no synovitis, warmth or effusion. No tenderness over the trochanteric bursa.   CDAI Exam:  CDAI Score: -- Patient Global: 3 mm; Provider Global: 3 mm Swollen: --; Tender: -- Joint Exam 01/20/2023   No joint exam has been documented for this visit   There is currently no information documented on the homunculus. Go to the Rheumatology activity and complete the homunculus joint exam.  Investigation: No additional findings.  Imaging: No results found.  Recent Labs: Lab Results  Component Value Date   WBC 12.3 (H) 10/12/2022   HGB 12.1 10/12/2022   PLT 324 10/12/2022   NA 140 10/12/2022   K 3.6 10/12/2022   CL 104 10/12/2022   CO2 28 10/12/2022   GLUCOSE 95 10/12/2022   BUN 20 10/12/2022   CREATININE 1.12 (H) 10/12/2022   BILITOT 0.4 10/12/2022   ALKPHOS 57 09/18/2022   AST 13 10/12/2022   ALT 7 10/12/2022   PROT 7.1 10/12/2022   ALBUMIN 3.8 09/18/2022   CALCIUM 9.2 10/12/2022   GFRAA >60 09/02/2020   QFTBGOLDPLUS NEGATIVE 08/14/2022    Speciality Comments: PLQ Eye Exam: 08/12/18 WNL @ [facility] Follow up in 1 year.  ENbrel started 08/31/22  Procedures:  No procedures performed Allergies: Patient has no known  allergies.   Assessment / Plan:     Visit Diagnoses: Rheumatoid arthritis involving multiple sites with positive rheumatoid factor (HCC) - Positive RF, positive anti-CCP, positive elevated sedimentation rate, positive synovitis on ultrasound.  Patient had no synovitis on the examination although she complains of some discomfort in the left wrist joint.  She is on Enbrel 50 mg injections once a week and sulfasalazine 100 mg BID. She was sick a month ago and she missed a week of medications. She didn't have any flares during this period. She rates her rheumatoid arthritis as a 3/10 today.   High risk medication use - Enbrel 50 mg sq injections once weekly initiated on August 31, 2022, Sulfasalazine 500 mg 2 tablets by mouth twice daily. She missed one week of medications a month ago when she was sick but resumed treatment once she was better. She had no flares during this period. TB gold was negative on 08/14/2022. She follows up with Benjamine Mola white in dermatology for annual skin cancer screening. Educated patient to hold the medications if she develops an infection or needs surgery.  Information about immunization was placed in the AVS.  Plan: CBC and CMP with GFR today, and every 3 months.  TB Gold was negative on August 14, 2022.  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  Underwent treatment with rifampin/INH for 3 months [facility] the [facility].  Treatment was completed in January 2020. Last TB gold was done in 07/2022 and was negative.   Primary osteoarthritis of both hands - Complete fist formation bilaterally. No warmth or effusions over the PIP and DIP joints.   Pain in left wrist - Most of her pain related to rheumatoid arthritis is in her wrist. She states she notices occasional swelling in the left wrist. There was no warmth or effusion on examination today.   Primary osteoarthritis of both hips - Stable today. No warmth, tenderness or effusion on physical exam.      Trochanteric bursitis, left hip - Trochanteric bursitis is stable today. No tenderness to palpation on exam. She receives steroid injections with pain management in [place].   Primary osteoarthritis of both knees - There is crepitus in the knee joints bilaterally.   Primary osteoarthritis of both feet - Not bothersome for the patient today. No tenderness or warmth  on exam.   HTN (hypertension), benign - Blood pressure was 101/71 in the office today. She is on norvasc 10 mg QD and lisinopril-hctz 20-12.5 2 tablets QD.   Vitamin D deficiency - She takes a vitamin D supplement. Will check vitamin D level today.   OSA (obstructive sleep apnea) - Patient has fatigue. Follow up with PCP.  Other medical conditions are listed as follows:  Gastroesophageal reflux disease, unspecified whether esophagitis present  MGUS (monoclonal gammopathy of unknown significance) - Evaluated by Dr. Alvy Bimler on 09/25/22-planning yearly follow up.   Orders: Orders Placed This Encounter  Procedures   CBC with Differential/Platelet   COMPLETE METABOLIC PANEL WITH GFR   VITAMIN D 25 Hydroxy (Vit-D Deficiency, Fractures)   No orders of the defined types were placed in this encounter.   Face-to-face time spent with patient was 30 minutes. Greater than 50% of time was spent in counseling and coordination of care.  Follow-Up Instructions: Return in about 5 months (around 06/20/2023) for Rheumatoid arthritis, Osteoarthritis.   Bo Merino, MD  Note - This record has been created using Editor, commissioning.  Chart creation errors have been sought, but may not always  have been located. Such creation errors do not reflect on  the standard of medical care.

## 2023-01-14 ENCOUNTER — Other Ambulatory Visit (HOSPITAL_COMMUNITY): Payer: Self-pay

## 2023-01-18 ENCOUNTER — Other Ambulatory Visit: Payer: Self-pay

## 2023-01-19 ENCOUNTER — Other Ambulatory Visit (HOSPITAL_COMMUNITY): Payer: Self-pay

## 2023-01-20 ENCOUNTER — Ambulatory Visit: Payer: Medicaid Other | Attending: Rheumatology | Admitting: Rheumatology

## 2023-01-20 ENCOUNTER — Encounter: Payer: Self-pay | Admitting: Rheumatology

## 2023-01-20 VITALS — BP 101/71 | HR 100 | Resp 16 | Ht 68.0 in | Wt 293.0 lb

## 2023-01-20 DIAGNOSIS — M170 Bilateral primary osteoarthritis of knee: Secondary | ICD-10-CM

## 2023-01-20 DIAGNOSIS — R7612 Nonspecific reaction to cell mediated immunity measurement of gamma interferon antigen response without active tuberculosis: Secondary | ICD-10-CM

## 2023-01-20 DIAGNOSIS — M19071 Primary osteoarthritis, right ankle and foot: Secondary | ICD-10-CM

## 2023-01-20 DIAGNOSIS — M19072 Primary osteoarthritis, left ankle and foot: Secondary | ICD-10-CM

## 2023-01-20 DIAGNOSIS — M0579 Rheumatoid arthritis with rheumatoid factor of multiple sites without organ or systems involvement: Secondary | ICD-10-CM | POA: Diagnosis not present

## 2023-01-20 DIAGNOSIS — M25532 Pain in left wrist: Secondary | ICD-10-CM

## 2023-01-20 DIAGNOSIS — K219 Gastro-esophageal reflux disease without esophagitis: Secondary | ICD-10-CM

## 2023-01-20 DIAGNOSIS — G4733 Obstructive sleep apnea (adult) (pediatric): Secondary | ICD-10-CM

## 2023-01-20 DIAGNOSIS — Z79899 Other long term (current) drug therapy: Secondary | ICD-10-CM | POA: Diagnosis not present

## 2023-01-20 DIAGNOSIS — M7062 Trochanteric bursitis, left hip: Secondary | ICD-10-CM

## 2023-01-20 DIAGNOSIS — D472 Monoclonal gammopathy: Secondary | ICD-10-CM

## 2023-01-20 DIAGNOSIS — E559 Vitamin D deficiency, unspecified: Secondary | ICD-10-CM

## 2023-01-20 DIAGNOSIS — I10 Essential (primary) hypertension: Secondary | ICD-10-CM

## 2023-01-20 DIAGNOSIS — M19041 Primary osteoarthritis, right hand: Secondary | ICD-10-CM | POA: Diagnosis not present

## 2023-01-20 DIAGNOSIS — M19042 Primary osteoarthritis, left hand: Secondary | ICD-10-CM

## 2023-01-20 DIAGNOSIS — M160 Bilateral primary osteoarthritis of hip: Secondary | ICD-10-CM

## 2023-01-20 NOTE — Patient Instructions (Signed)
Standing Labs We placed an order today for your standing lab work.   Please have your standing labs drawn in May and every 3 months  Please have your labs drawn 2 weeks prior to your appointment so that the provider can discuss your lab results at your appointment.  Please note that you may see your imaging and lab results in [facility] before we have reviewed them. We will contact you once all results are reviewed. Please allow our office up to 72 hours to thoroughly review all of the results before contacting the office for clarification of your results.  Lab hours are:   Monday through Thursday from 8:00 am -12:30 pm and 1:00 pm-5:00 pm and Friday from 8:00 am-12:00 pm.  Please be advised, all patients with office appointments requiring lab work will take precedent over walk-in lab work.   Labs are drawn by [facility]. Please bring your co-pay at the time of your lab draw.  You may receive a bill from [facility] for your lab work.  Please note if you are on Hydroxychloroquine and and an order has been placed for a Hydroxychloroquine level, you will need to have it drawn 4 hours or more after your last dose.  If you wish to have your labs drawn at another location, please call the office 24 hours in advance so we can fax the orders.  The office is located at [address], [place], [place], [place] [address] No appointment is necessary.    If you have any questions regarding directions or hours of operation,  please call [phone].   As a reminder, please drink plenty of water prior to coming for your lab work. Thanks!   Vaccines You are taking a medication(s) that can suppress your immune system.  The following immunizations are recommended: Flu annually Covid-19  Td/Tdap (tetanus, diphtheria, pertussis) every 10 years Pneumonia (Prevnar 15 then Pneumovax 23 at least 1 year apart.  Alternatively, can take Prevnar 20 without needing additional dose) Shingrix: 2 doses from 4 weeks to 6  months apart  Please check with your PCP to make sure you are up to date.   If you have signs or symptoms of an infection or start antibiotics: First, call your PCP for workup of your infection. Hold your medication through the infection, until you complete your antibiotics, and until symptoms resolve if you take the following: Injectable medication (Actemra, Benlysta, Cimzia, Cosentyx, Enbrel, Humira, Kevzara, Orencia, Remicade, Simponi, Stelara, Taltz, Tremfya) Methotrexate Leflunomide (Arava) Mycophenolate (Cellcept) Morrie Sheldon, Olumiant, or Rinvoq   Please get an annual skin examination to screen for skin cancer.

## 2023-01-21 LAB — CBC WITH DIFFERENTIAL/PLATELET
Absolute Monocytes: 653 cells/uL (ref 200–950)
Basophils Absolute: 64 cells/uL (ref 0–200)
Basophils Relative: 0.6 %
Eosinophils Absolute: 268 cells/uL (ref 15–500)
Eosinophils Relative: 2.5 %
HCT: 40.1 % (ref 35.0–45.0)
Hemoglobin: 13.3 g/dL (ref 11.7–15.5)
Lymphs Abs: 4794 cells/uL — ABNORMAL HIGH (ref 850–3900)
MCH: 32.5 pg (ref 27.0–33.0)
MCHC: 33.2 g/dL (ref 32.0–36.0)
MCV: 98 fL (ref 80.0–100.0)
MPV: 10.5 fL (ref 7.5–12.5)
Monocytes Relative: 6.1 %
Neutro Abs: 4922 cells/uL (ref 1500–7800)
Neutrophils Relative %: 46 %
Platelets: 330 10*3/uL (ref 140–400)
RBC: 4.09 10*6/uL (ref 3.80–5.10)
RDW: 11.6 % (ref 11.0–15.0)
Total Lymphocyte: 44.8 %
WBC: 10.7 10*3/uL (ref 3.8–10.8)

## 2023-01-21 LAB — COMPLETE METABOLIC PANEL WITH GFR
AG Ratio: 1.1 (calc) (ref 1.0–2.5)
ALT: 9 U/L (ref 6–29)
AST: 17 U/L (ref 10–35)
Albumin: 3.9 g/dL (ref 3.6–5.1)
Alkaline phosphatase (APISO): 55 U/L (ref 37–153)
BUN/Creatinine Ratio: 14 (calc) (ref 6–22)
BUN: 16 mg/dL (ref 7–25)
CO2: 32 mmol/L (ref 20–32)
Calcium: 9.4 mg/dL (ref 8.6–10.4)
Chloride: 103 mmol/L (ref 98–110)
Creat: 1.12 mg/dL — ABNORMAL HIGH (ref 0.50–1.03)
Globulin: 3.7 g/dL (calc) (ref 1.9–3.7)
Glucose, Bld: 97 mg/dL (ref 65–99)
Potassium: 3.9 mmol/L (ref 3.5–5.3)
Sodium: 142 mmol/L (ref 135–146)
Total Bilirubin: 0.5 mg/dL (ref 0.2–1.2)
Total Protein: 7.6 g/dL (ref 6.1–8.1)
eGFR: 59 mL/min/{1.73_m2} — ABNORMAL LOW (ref >=60)

## 2023-01-21 LAB — VITAMIN D 25 HYDROXY (VIT D DEFICIENCY, FRACTURES): Vit D, 25-Hydroxy: 87 ng/mL (ref 30–100)

## 2023-01-21 NOTE — Progress Notes (Signed)
Vitamin D is high normal at 87.  GFR is low and stable.  CBC is normal.

## 2023-02-11 ENCOUNTER — Other Ambulatory Visit (HOSPITAL_COMMUNITY): Payer: Self-pay

## 2023-02-11 ENCOUNTER — Other Ambulatory Visit: Payer: Self-pay | Admitting: Rheumatology

## 2023-02-11 DIAGNOSIS — M0579 Rheumatoid arthritis with rheumatoid factor of multiple sites without organ or systems involvement: Secondary | ICD-10-CM

## 2023-02-11 DIAGNOSIS — Z79899 Other long term (current) drug therapy: Secondary | ICD-10-CM

## 2023-02-11 MED ORDER — ENBREL MINI 50 MG/ML ~~LOC~~ SOCT
50.0000 mg | SUBCUTANEOUS | 0 refills | Status: DC
  Filled 2023-02-11: qty 4, 28d supply, fill #0
  Filled 2023-03-09: qty 4, 28d supply, fill #1
  Filled 2023-04-05: qty 4, 28d supply, fill #2

## 2023-02-11 NOTE — Telephone Encounter (Signed)
Next Visit: 06/23/2023  Last Visit: 01/20/2023  Last Fill: 11/13/2022  [id number] arthritis involving multiple sites with positive rheumatoid factor   Current Dose per office note 01/20/2023: Enbrel 50 mg sq injections once weekly   Labs: 01/20/2023 Vitamin D is high normal at 87.  GFR is low and stable.  CBC is normal.   TB Gold: 08/14/2022 Neg    Okay to refill Enbrel?

## 2023-02-15 ENCOUNTER — Other Ambulatory Visit: Payer: Self-pay

## 2023-03-09 ENCOUNTER — Other Ambulatory Visit (HOSPITAL_COMMUNITY): Payer: Self-pay

## 2023-03-16 ENCOUNTER — Other Ambulatory Visit (HOSPITAL_COMMUNITY): Payer: Self-pay

## 2023-04-05 ENCOUNTER — Other Ambulatory Visit (HOSPITAL_COMMUNITY): Payer: Self-pay

## 2023-04-12 ENCOUNTER — Other Ambulatory Visit (HOSPITAL_COMMUNITY): Payer: Self-pay

## 2023-04-13 ENCOUNTER — Other Ambulatory Visit: Payer: Self-pay | Admitting: Physician Assistant

## 2023-04-13 ENCOUNTER — Other Ambulatory Visit: Payer: Self-pay

## 2023-04-13 ENCOUNTER — Other Ambulatory Visit (HOSPITAL_COMMUNITY): Payer: Self-pay

## 2023-04-13 DIAGNOSIS — Z79899 Other long term (current) drug therapy: Secondary | ICD-10-CM

## 2023-04-13 DIAGNOSIS — M0579 Rheumatoid arthritis with rheumatoid factor of multiple sites without organ or systems involvement: Secondary | ICD-10-CM

## 2023-04-13 MED ORDER — ENBREL MINI 50 MG/ML ~~LOC~~ SOCT
50.0000 mg | SUBCUTANEOUS | 0 refills | Status: DC
  Filled 2023-04-13: qty 12, 84d supply, fill #0
  Filled 2023-05-05: qty 4, 28d supply, fill #0
  Filled 2023-06-01 (×2): qty 4, 28d supply, fill #1
  Filled 2023-06-30: qty 4, 28d supply, fill #2

## 2023-04-13 NOTE — Telephone Encounter (Signed)
Last Fill: 02/11/2023  Labs: 01/20/2023 Vitamin D is high normal at 87.  GFR is low and stable.  CBC is normal.   TB Gold: 08/14/2022   TB Gold is negative.   Next Visit: 06/23/2023  Last Visit: 01/20/2023  [id number] arthritis involving multiple sites with positive rheumatoid factor   Current Dose per office note 01/20/2023:  Enbrel 50 mg sq injections once weekly   Okay to refill Enbrel?

## 2023-04-13 NOTE — Telephone Encounter (Signed)
Due to update lab work this month..

## 2023-05-05 ENCOUNTER — Other Ambulatory Visit: Payer: Self-pay

## 2023-06-01 ENCOUNTER — Other Ambulatory Visit (HOSPITAL_COMMUNITY): Payer: Self-pay

## 2023-06-04 ENCOUNTER — Other Ambulatory Visit (HOSPITAL_COMMUNITY): Payer: Self-pay

## 2023-06-09 ENCOUNTER — Other Ambulatory Visit (HOSPITAL_COMMUNITY): Payer: Self-pay

## 2023-06-09 NOTE — Progress Notes (Unsigned)
Office Visit Note  Patient: Alexandra Powers             Date of Birth: [birth year]           MRN: [id number]             PCP: Barbette Merino, NP Referring: Barbette Merino, NP Visit Date: 06/23/2023 Occupation: @GUAROCC @  Subjective:  Medication monitoring   History of Present Illness: Alexandra Powers is a 52 y.o. female with history of seropositive rheumatoid arthritis and osteoarthritis.  Patient remains on Enbrel 50 mg sq injections once weekly initiated on August 31, 2022 and Sulfasalazine 500 mg 2 tablets by mouth twice daily.  She is tolerating nation therapy without any side effects or injection site reactions from Enbrel.  Patient has been injecting her Enbrel dose every Monday.  She denies any recent interruptions in therapy.  She denies any recent or recurrent infections.  Patient states that overall she has noticed a significant improvement in combination therapy.  She denies any joint swelling at this time.  She continues to have chronic pain in both knee joints and has been working on weight loss in order to be eligible for a knee replacement in the future.  She is using a rollator walker to assist with ambulation.  She remains under the care of pain management.  Patient states that she had bilateral shoulder joint injections performed on 06/14/2023 which resolved the discomfort she was experiencing.  Activities of Daily Living:  Patient reports morning stiffness for 45 minutes.   Patient Reports nocturnal pain.  Difficulty dressing/grooming: Reports Difficulty climbing stairs: Reports Difficulty getting out of chair: Reports Difficulty using hands for taps, buttons, cutlery, and/or writing: Reports  Review of Systems  Constitutional:  Positive for fatigue.  HENT:  Negative for mouth sores and mouth dryness.   Eyes:  Negative for dryness.  Respiratory:  Negative for shortness of breath.   Cardiovascular:  Negative for chest pain and palpitations.  Gastrointestinal:  Negative for  blood in stool, constipation and diarrhea.  Endocrine: Negative for increased urination.  Genitourinary:  Negative for involuntary urination.  Musculoskeletal:  Positive for joint pain, gait problem, joint pain, joint swelling, myalgias, muscle weakness, morning stiffness, muscle tenderness and myalgias.  Skin:  Negative for color change, rash, hair loss and sensitivity to sunlight.  Allergic/Immunologic: Negative for susceptible to infections.  Neurological:  Negative for dizziness and headaches.  Hematological:  Negative for swollen glands.  Psychiatric/Behavioral:  Positive for sleep disturbance. Negative for depressed mood. The patient is not nervous/anxious.     PMFS History:  Patient Active Problem List   Diagnosis Date Noted   CKD (chronic kidney disease), stage III (HCC) 09/09/2020   Preventive measure 09/09/2020   CKD (chronic kidney disease), stage II 09/08/2019   Trochanteric bursitis of left hip 09/08/2019   Anxiety and depression 09/01/2019   Chronic pain syndrome 08/08/2019   Chronic myofascial pain 08/08/2019   Bilateral breast cysts 08/01/2019   Cellulitis of left breast 07/14/2019   Other rheumatoid arthritis with rheumatoid factor of left hip (HCC) 07/14/2019   Primary osteoarthritis of both hips 07/29/2018   Primary osteoarthritis of both knees 07/29/2018   Primary osteoarthritis of both feet 07/29/2018   Tonsillitis 05/02/2018   Acute prerenal failure (HCC) 09/03/2017   OSA (obstructive sleep apnea) 12/10/2016   GERD (gastroesophageal reflux disease) 12/04/2016   Low back pain at multiple sites 08/21/2016   Chronic pain of multiple joints 08/13/2016   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) 07/02/2016   Vitamin D deficiency 07/02/2016   HTN (hypertension), benign 11/11/2015   Morbid obesity (HCC) 11/11/2015   Adrenal mass (HCC) 08/30/2015   Ovarian cyst 08/30/2015    Past Medical History:  Diagnosis Date   Arthritis    Gallstones    GERD  (gastroesophageal reflux disease)    Hypertension    Mass of adrenal gland (HCC)    MGUS (monoclonal gammopathy of unknown significance)    Obesity    Osteoarthritis    PONV (postoperative nausea and vomiting)    Rheumatoid arthritis (HCC)    Sleep apnea    SENT CPAP BACK, NOT WORN IN 7-8 MONTHS    Family History  Problem Relation Age of Onset   Arthritis Mother    High blood pressure Mother    Sleep apnea Mother    High blood pressure Father    Breast cancer Paternal Aunt    Arthritis Sister    Healthy Daughter    Healthy Son    Adrenal disorder Neg Hx    Colon cancer Neg Hx    Esophageal cancer Neg Hx    Pancreatic cancer Neg Hx    Stomach cancer Neg Hx    Liver disease Neg Hx    Past Surgical History:  Procedure Laterality Date   BREAST DUCTAL SYSTEM EXCISION Bilateral 10/12/2017   Procedure: EXCISION OF BILATERAL CENTRAL DUCT;  Surgeon: Harriette Bouillon, MD;  Location: [facility] OR;  Service: General;  Laterality: Bilateral;   BREAST EXCISIONAL BIOPSY     CHOLECYSTECTOMY N/A 12/21/2015   Procedure: LAPAROSCOPIC CHOLECYSTECTOMY;  Surgeon: Violeta Gelinas, MD;  Location: [facility] OR;  Service: General;  Laterality: N/A;   UMBILICAL HERNIA REPAIR N/A 12/27/2015   Procedure: INCARCERATED UMBILICAL HERNIA REPAIR;  Surgeon: Abigail Miyamoto, MD;  Location: [facility] OR;  Service: General;  Laterality: N/A;   Social History   Social History Narrative   Lives with husband, Earvin Hansen.   Immunization History  Administered Date(s) Administered   Influenza,inj,Quad PF,6+ Mos 08/29/2015, 08/21/2016, 08/31/2017, 08/09/2018, 08/01/2019, 09/09/2020, 09/25/2022   Influenza,inj,quad, With Preservative 08/30/2017   PFIZER(Purple Top)SARS-COV-2 Vaccination 02/16/2020, 03/08/2020, 12/07/2020   Tdap 08/29/2015     Objective: Vital Signs: BP 106/63 (BP Location: Left Arm, Patient Position: Sitting, Cuff Size: Large)   Pulse 79   Resp 13   Ht 5\' 8"  (1.727 m)   Wt (!) 314 lb (142.4 kg)   LMP 04/16/2019    BMI 47.74 kg/m    Physical Exam Vitals and nursing note reviewed.  Constitutional:      Appearance: She is well-developed.  HENT:     Head: Normocephalic and atraumatic.  Eyes:     Conjunctiva/sclera: Conjunctivae normal.  Cardiovascular:     Rate and Rhythm: Normal rate and regular rhythm.     Heart sounds: Normal heart sounds.  Pulmonary:     Effort: Pulmonary effort is normal.     Breath sounds: Normal breath sounds.  Abdominal:     General: Bowel sounds are normal.     Palpations: Abdomen is soft.  Musculoskeletal:     Cervical back: Normal range of motion.  Lymphadenopathy:     Cervical: No cervical adenopathy.  Skin:    General: Skin is warm and dry.     Capillary Refill: Capillary refill takes less than 2 seconds.  Neurological:     Mental Status: She is alert and oriented to person, place, and time.  Psychiatric:        Behavior:  Behavior normal.      Musculoskeletal Exam: Patient remained seated during examination today.  C-spine has good range of motion with no discomfort.  Shoulder joints have good range of motion with no discomfort or tenderness.  Elbow joints, wrist joints, MCPs, PIPs, DIPs have good range of motion with no synovitis.  Complete fist formation bilaterally.  Hip joints have good range of motion with no groin pain.  Both knee joints have good range of motion with discomfort bilaterally.  No knee joint effusion noted.  Ankle joints have good range of motion with no tenderness or synovitis.  CDAI Exam: CDAI Score: -- Patient Global: 30 / 100; Provider Global: 30 / 100 Swollen: --; Tender: -- Joint Exam 06/23/2023   No joint exam has been documented for this visit   There is currently no information documented on the homunculus. Go to the Rheumatology activity and complete the homunculus joint exam.  Investigation: No additional findings.  Imaging: No results found.  Recent Labs: Lab Results  Component Value Date   WBC 10.7 01/20/2023    HGB 13.3 01/20/2023   PLT 330 01/20/2023   NA 142 01/20/2023   K 3.9 01/20/2023   CL 103 01/20/2023   CO2 32 01/20/2023   GLUCOSE 97 01/20/2023   BUN 16 01/20/2023   CREATININE 1.12 (H) 01/20/2023   BILITOT 0.5 01/20/2023   ALKPHOS 57 09/18/2022   AST 17 01/20/2023   ALT 9 01/20/2023   PROT 7.6 01/20/2023   ALBUMIN 3.8 09/18/2022   CALCIUM 9.4 01/20/2023   GFRAA >60 09/02/2020   QFTBGOLDPLUS NEGATIVE 08/14/2022    Speciality Comments: PLQ Eye Exam: 08/12/18 WNL @ [facility] Follow up in 1 year.  ENbrel started 08/31/22  Procedures:  No procedures performed Allergies: Patient has no known allergies.   Assessment / Plan:     Visit Diagnoses: Rheumatoid arthritis involving multiple sites with positive rheumatoid factor (HCC) - Positive RF, positive anti-CCP, positive elevated sedimentation rate, positive synovitis on ultrasound: She has no joint tenderness or synovitis on examination today.  Her rheumatoid arthritis is well-controlled on Enbrel 50 mg subcu days injections once weekly along with sulfasalazine 500 mg 2 tablets by mouth twice daily.  She is tolerating combination therapy without any side effects or injection site reactions from Enbrel.  Patient states that if she misses doses of sulfasalazine she notices increased pain and stiffness in both hands.  No medication changes will be made at this time.  She was advised to notify us if she develops signs or symptoms of a flare.  She will follow-up in the office in 5 months or sooner if needed.  High risk medication use - Enbrel 50 mg sq injections once weekly initiated on August 31, 2022, Sulfasalazine 500 mg 2 tablets by mouth twice daily.  CBC and CMP updated on 01/20/23. Orders for CBC and CMP released today. Work will be due in October and every 3 months to monitor for toxicity.  Standing orders for CBC and CMP remain in place. TB gold negative on 08/14/22. Future order for TB gold placed today.  No recent or recurrent  infections. Discussed the importance of holding enbrel and sulfsalazine if she develops signs or symptoms of an infection and to resume once the infection has completely cleared.  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  Underwent treatment with rifampin/INH for 3 months [facility].  TB Gold was negative on 08/14/2022.  Discussed yearly CXR.    Screening for tuberculosis -Future order for TB gold placed today.  Plan: QuantiFERON-TB Gold Plus  Primary osteoarthritis of both hands: No tenderness or inflammation noted today.   Pain in left wrist: Resolved.  No tenderness or synovitis.   Primary osteoarthritis of both hips: Good ROM with no groin pain.  Using rollator walker to assist with ambulation.   Trochanteric bursitis, left hip: Not currently symptomatic.   Primary osteoarthritis of both knees: Chronic pain.  Patient is using a rollator walker to assist with ambulation.  She continues to have chronic pain and stiffness in both knee joints.  She has been trying to work on weight loss in order to be eligible for knee replacement in the future.  She remains under the care of pain management.  Primary osteoarthritis of both feet: She is not experiencing any discomfort in her feet at this time.  She has good range of motion of both ankle joints with no tenderness or synovitis.  Other medical conditions are listed as follows:  HTN (hypertension), benign: Blood pressure was 106/63 today in the office.  Gastroesophageal reflux disease, unspecified whether esophagitis present  MGUS (monoclonal gammopathy of unknown significance) - Evaluated by Dr. Bertis Ruddy on 09/25/22-planning yearly follow up.  Vitamin D deficiency  OSA (obstructive sleep apnea)    Orders: Orders Placed This Encounter  Procedures   CBC with Differential/Platelet   COMPLETE METABOLIC  PANEL WITH GFR   QuantiFERON-TB Gold Plus   Meds ordered this encounter  Medications   sulfaSALAzine (AZULFIDINE) 500 MG tablet    Sig: Take 2 tablets (1,000 mg total) by mouth 2 (two) times daily.    Dispense:  360 tablet    Refill:  0      Follow-Up Instructions: Return in about 5 months (around 11/23/2023) for Rheumatoid arthritis, Osteoarthritis.   Gearldine Bienenstock, PA-C  Note - This record has been created using Dragon software.  Chart creation errors have been sought, but may not always  have been located. Such creation errors do not reflect on  the standard of medical care.

## 2023-06-23 ENCOUNTER — Encounter: Payer: Self-pay | Admitting: Physician Assistant

## 2023-06-23 ENCOUNTER — Ambulatory Visit: Payer: Medicaid Other | Attending: Physician Assistant | Admitting: Physician Assistant

## 2023-06-23 VITALS — BP 106/63 | HR 79 | Resp 13 | Ht 68.0 in | Wt 314.0 lb

## 2023-06-23 DIAGNOSIS — E559 Vitamin D deficiency, unspecified: Secondary | ICD-10-CM

## 2023-06-23 DIAGNOSIS — M7062 Trochanteric bursitis, left hip: Secondary | ICD-10-CM

## 2023-06-23 DIAGNOSIS — M0579 Rheumatoid arthritis with rheumatoid factor of multiple sites without organ or systems involvement: Secondary | ICD-10-CM | POA: Diagnosis not present

## 2023-06-23 DIAGNOSIS — Z111 Encounter for screening for respiratory tuberculosis: Secondary | ICD-10-CM

## 2023-06-23 DIAGNOSIS — M19072 Primary osteoarthritis, left ankle and foot: Secondary | ICD-10-CM

## 2023-06-23 DIAGNOSIS — D472 Monoclonal gammopathy: Secondary | ICD-10-CM

## 2023-06-23 DIAGNOSIS — M170 Bilateral primary osteoarthritis of knee: Secondary | ICD-10-CM

## 2023-06-23 DIAGNOSIS — K219 Gastro-esophageal reflux disease without esophagitis: Secondary | ICD-10-CM

## 2023-06-23 DIAGNOSIS — M25532 Pain in left wrist: Secondary | ICD-10-CM

## 2023-06-23 DIAGNOSIS — Z79899 Other long term (current) drug therapy: Secondary | ICD-10-CM

## 2023-06-23 DIAGNOSIS — M19071 Primary osteoarthritis, right ankle and foot: Secondary | ICD-10-CM

## 2023-06-23 DIAGNOSIS — M19041 Primary osteoarthritis, right hand: Secondary | ICD-10-CM | POA: Diagnosis not present

## 2023-06-23 DIAGNOSIS — M19042 Primary osteoarthritis, left hand: Secondary | ICD-10-CM

## 2023-06-23 DIAGNOSIS — R7612 Nonspecific reaction to cell mediated immunity measurement of gamma interferon antigen response without active tuberculosis: Secondary | ICD-10-CM

## 2023-06-23 DIAGNOSIS — I10 Essential (primary) hypertension: Secondary | ICD-10-CM

## 2023-06-23 DIAGNOSIS — M160 Bilateral primary osteoarthritis of hip: Secondary | ICD-10-CM

## 2023-06-23 DIAGNOSIS — G4733 Obstructive sleep apnea (adult) (pediatric): Secondary | ICD-10-CM

## 2023-06-23 LAB — CBC WITH DIFFERENTIAL/PLATELET
Absolute Monocytes: 900 cells/uL (ref 200–950)
Basophils Absolute: 60 cells/uL (ref 0–200)
Eosinophils Absolute: 192 cells/uL (ref 15–500)
Eosinophils Relative: 1.6 %
HCT: 36 % (ref 35.0–45.0)
Hemoglobin: 11.8 g/dL (ref 11.7–15.5)
MCH: 34.2 pg — ABNORMAL HIGH (ref 27.0–33.0)
MCHC: 32.8 g/dL (ref 32.0–36.0)
MCV: 104.3 fL — ABNORMAL HIGH (ref 80.0–100.0)
Neutro Abs: 5808 cells/uL (ref 1500–7800)
Platelets: 279 10*3/uL (ref 140–400)
RBC: 3.45 10*6/uL — ABNORMAL LOW (ref 3.80–5.10)
RDW: 13.1 % (ref 11.0–15.0)
WBC: 12 10*3/uL — ABNORMAL HIGH (ref 3.8–10.8)

## 2023-06-23 MED ORDER — SULFASALAZINE 500 MG PO TABS: 1000.0000 mg | ORAL_TABLET | Freq: Two times a day (BID) | ORAL | 0 refills | Status: DC

## 2023-06-23 NOTE — Patient Instructions (Signed)

## 2023-06-24 LAB — COMPLETE METABOLIC PANEL WITH GFR
AG Ratio: 1.2 (calc) (ref 1.0–2.5)
ALT: 8 U/L (ref 6–29)
Albumin: 4 g/dL (ref 3.6–5.1)
Alkaline phosphatase (APISO): 54 U/L (ref 37–153)
BUN/Creatinine Ratio: 20 (calc) (ref 6–22)
BUN: 22 mg/dL (ref 7–25)
CO2: 27 mmol/L (ref 20–32)
Calcium: 9 mg/dL (ref 8.6–10.4)
Chloride: 106 mmol/L (ref 98–110)
Creat: 1.12 mg/dL — ABNORMAL HIGH (ref 0.50–1.03)
Globulin: 3.4 g/dL (calc) (ref 1.9–3.7)
Potassium: 3.9 mmol/L (ref 3.5–5.3)
Total Bilirubin: 0.4 mg/dL (ref 0.2–1.2)
Total Protein: 7.4 g/dL (ref 6.1–8.1)
eGFR: 59 mL/min/{1.73_m2} — ABNORMAL LOW (ref >=60)

## 2023-06-24 LAB — CBC WITH DIFFERENTIAL/PLATELET
Basophils Relative: 0.5 %
Lymphs Abs: 5040 cells/uL — ABNORMAL HIGH (ref 850–3900)
MPV: 10.8 fL (ref 7.5–12.5)
Monocytes Relative: 7.5 %
Neutrophils Relative %: 48.4 %
Total Lymphocyte: 42 %

## 2023-06-24 NOTE — Progress Notes (Signed)
Creatinine is elevated-1.12 and GFR is borderline low-59. Avoid the use of NSAIDs. Rest of CMP WNL   WBC count is elevated-12.0. absolute lymphocytes are elevated and continue to trend up. Please clarify if she has had any recent infections?  Please clarify if she has seen a hematologist in the past? MCV and MCH are elevated and RBC count is borderline low.

## 2023-06-30 ENCOUNTER — Other Ambulatory Visit (HOSPITAL_COMMUNITY): Payer: Self-pay

## 2023-07-05 ENCOUNTER — Other Ambulatory Visit (HOSPITAL_COMMUNITY): Payer: Self-pay

## 2023-07-29 ENCOUNTER — Other Ambulatory Visit (HOSPITAL_COMMUNITY): Payer: Self-pay

## 2023-07-29 ENCOUNTER — Other Ambulatory Visit: Payer: Self-pay | Admitting: Physician Assistant

## 2023-07-29 DIAGNOSIS — M0579 Rheumatoid arthritis with rheumatoid factor of multiple sites without organ or systems involvement: Secondary | ICD-10-CM

## 2023-07-29 DIAGNOSIS — Z79899 Other long term (current) drug therapy: Secondary | ICD-10-CM

## 2023-07-29 MED ORDER — ENBREL MINI 50 MG/ML ~~LOC~~ SOCT
50.0000 mg | SUBCUTANEOUS | 0 refills | Status: DC
  Filled 2023-07-29: qty 4, 28d supply, fill #0
  Filled 2023-08-24: qty 4, 28d supply, fill #1
  Filled 2023-09-17: qty 4, 28d supply, fill #2

## 2023-07-29 NOTE — Telephone Encounter (Signed)
Last Fill: 04/13/2023  Labs: 06/23/2023 Creatinine is elevated-1.12 and GFR is borderline low-59. Rest of CMP WNL WBC count is elevated-12.0. absolute lymphocytes are elevated and continue to trend up. MCV and MCH are elevated and RBC count is borderline low.     TB Gold: 08/14/2022 Neg    Next Visit: 11/25/2023  Last Visit: 06/23/2023  DX: Rheumatoid arthritis involving multiple sites with positive rheumatoid factor   Current Dose per office note 06/23/2023: Enbrel 50 mg sq injections once weekly   Okay to refill Enbrel?

## 2023-07-30 ENCOUNTER — Other Ambulatory Visit (HOSPITAL_COMMUNITY): Payer: Self-pay

## 2023-08-18 ENCOUNTER — Telehealth: Payer: Self-pay

## 2023-08-18 NOTE — Telephone Encounter (Signed)
Received notification from [facility] regarding a prior authorization for ENBREL. Authorization has been APPROVED from 08/18/2023 to 08/17/2024. Approval letter sent to scan center. Updated [employer] with current Prior Authorization information.  Authorization # [id number]

## 2023-08-18 NOTE — Telephone Encounter (Signed)
Per Rema Jasmine, current Prior Authorization is expiring.  Submitted a Prior Authorization request to [facility] MEDICAID for ENBREL via CoverMyMeds. Will update once we receive a response.   Key: [id number]

## 2023-08-24 ENCOUNTER — Other Ambulatory Visit (HOSPITAL_COMMUNITY): Payer: Self-pay

## 2023-08-24 ENCOUNTER — Other Ambulatory Visit: Payer: Self-pay

## 2023-08-24 NOTE — Progress Notes (Signed)
Specialty Pharmacy Refill Coordination Note  Alexandra Powers is a 52 y.o. female contacted today regarding refills of specialty medication(s) Etanercept .  Patient requested Delivery  on 09/01/23  to verified address [address]   [place] [address]   Medication will be filled on 08/31/23.

## 2023-09-03 ENCOUNTER — Other Ambulatory Visit (HOSPITAL_COMMUNITY): Payer: Self-pay

## 2023-09-17 ENCOUNTER — Other Ambulatory Visit: Payer: Self-pay

## 2023-09-17 ENCOUNTER — Inpatient Hospital Stay: Payer: Medicaid Other | Attending: Hematology and Oncology

## 2023-09-17 DIAGNOSIS — Z79899 Other long term (current) drug therapy: Secondary | ICD-10-CM | POA: Insufficient documentation

## 2023-09-17 DIAGNOSIS — N183 Chronic kidney disease, stage 3 unspecified: Secondary | ICD-10-CM | POA: Insufficient documentation

## 2023-09-17 DIAGNOSIS — D472 Monoclonal gammopathy: Secondary | ICD-10-CM | POA: Insufficient documentation

## 2023-09-17 LAB — CBC WITH DIFFERENTIAL/PLATELET
Abs Immature Granulocytes: 0.02 10*3/uL (ref 0.00–0.07)
Basophils Absolute: 0.1 10*3/uL (ref 0.0–0.1)
Basophils Relative: 1 %
Eosinophils Absolute: 0.3 10*3/uL (ref 0.0–0.5)
Eosinophils Relative: 4 %
HCT: 37 % (ref 36.0–46.0)
Hemoglobin: 12.1 g/dL (ref 12.0–15.0)
Immature Granulocytes: 0 %
Lymphocytes Relative: 48 %
Lymphs Abs: 3.7 10*3/uL (ref 0.7–4.0)
MCH: 34.6 pg — ABNORMAL HIGH (ref 26.0–34.0)
MCHC: 32.7 g/dL (ref 30.0–36.0)
MCV: 105.7 fL — ABNORMAL HIGH (ref 80.0–100.0)
Monocytes Absolute: 0.6 10*3/uL (ref 0.1–1.0)
Monocytes Relative: 8 %
Neutro Abs: 3 10*3/uL (ref 1.7–7.7)
Neutrophils Relative %: 39 %
Platelets: 243 10*3/uL (ref 150–400)
RBC: 3.5 MIL/uL — ABNORMAL LOW (ref 3.87–5.11)
RDW: 13.4 % (ref 11.5–15.5)
WBC: 7.7 10*3/uL (ref 4.0–10.5)
nRBC: 0 % (ref 0.0–0.2)

## 2023-09-17 LAB — COMPREHENSIVE METABOLIC PANEL
ALT: 7 U/L (ref 0–44)
AST: 16 U/L (ref 15–41)
Albumin: 4 g/dL (ref 3.5–5.0)
Alkaline Phosphatase: 58 U/L (ref 38–126)
Anion gap: 5 (ref 5–15)
BUN: 14 mg/dL (ref 6–20)
CO2: 30 mmol/L (ref 22–32)
Calcium: 9.4 mg/dL (ref 8.9–10.3)
Chloride: 106 mmol/L (ref 98–111)
Creatinine, Ser: 1 mg/dL (ref 0.44–1.00)
GFR, Estimated: >60 mL/min (ref >60)
Glucose, Bld: 85 mg/dL (ref 70–99)
Potassium: 3.8 mmol/L (ref 3.5–5.1)
Sodium: 141 mmol/L (ref 135–145)
Total Bilirubin: 0.4 mg/dL (ref 0.3–1.2)
Total Protein: 7.7 g/dL (ref 6.5–8.1)

## 2023-09-17 NOTE — Progress Notes (Signed)
Specialty Pharmacy Refill Coordination Note  Alexandra Powers is a 52 y.o. female contacted today regarding refills of specialty medication(s) Etanercept   Patient requested Delivery   Delivery date: 09/29/23   Verified address: [address]   [name] [address]   Medication will be filled on 09/28/23.

## 2023-09-20 LAB — KAPPA/LAMBDA LIGHT CHAINS
Kappa free light chain: 42.2 mg/L — ABNORMAL HIGH (ref 3.3–19.4)
Kappa, lambda light chain ratio: 2.51 — ABNORMAL HIGH (ref 0.26–1.65)
Lambda free light chains: 16.8 mg/L (ref 5.7–26.3)

## 2023-09-21 LAB — MULTIPLE MYELOMA PANEL, SERUM
Albumin SerPl Elph-Mcnc: 3.4 g/dL (ref 2.9–4.4)
Albumin/Glob SerPl: 1 (ref 0.7–1.7)
Alpha 1: 0.2 g/dL (ref 0.0–0.4)
Alpha2 Glob SerPl Elph-Mcnc: 0.7 g/dL (ref 0.4–1.0)
B-Globulin SerPl Elph-Mcnc: 1.4 g/dL — ABNORMAL HIGH (ref 0.7–1.3)
Gamma Glob SerPl Elph-Mcnc: 1.3 g/dL (ref 0.4–1.8)
Globulin, Total: 3.6 g/dL (ref 2.2–3.9)
IgA: 580 mg/dL — ABNORMAL HIGH (ref 87–352)
IgG (Immunoglobin G), Serum: 1478 mg/dL (ref 586–1602)
IgM (Immunoglobulin M), Srm: 101 mg/dL (ref 26–217)
Total Protein ELP: 7 g/dL (ref 6.0–8.5)

## 2023-09-28 ENCOUNTER — Encounter: Payer: Self-pay | Admitting: Hematology and Oncology

## 2023-09-28 ENCOUNTER — Inpatient Hospital Stay (HOSPITAL_BASED_OUTPATIENT_CLINIC_OR_DEPARTMENT_OTHER): Payer: Medicaid Other | Admitting: Hematology and Oncology

## 2023-09-28 VITALS — BP 119/76 | HR 89 | Temp 98.3°F | Resp 18 | Ht 68.0 in | Wt 317.4 lb

## 2023-09-28 DIAGNOSIS — D472 Monoclonal gammopathy: Secondary | ICD-10-CM

## 2023-09-28 NOTE — Assessment & Plan Note (Signed)
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 Her IgG level and light chain levels are stable/improved I discussed with her the natural history of MGUS. Recent blood work showed no significant signs of disease progression She would need close follow-up once a year with history, blood work and examination. She agreed

## 2023-09-28 NOTE — Progress Notes (Signed)
[facility] OFFICE PROGRESS NOTE  Patient Care Team: Barbette Merino, NP as PCP - General (Adult Health Nurse Practitioner) Donnetta Hail, MD as Consulting Physician (Rheumatology)  ASSESSMENT & PLAN:  MGUS (monoclonal gammopathy of unknown significance) Her blood work is most consistent with MGUS. Her IgG level and light chain levels are stable/improved I discussed with her the natural history of MGUS. Recent blood work showed no significant signs of disease progression She would need close follow-up once a year with history, blood work and examination. She agreed  No orders of the defined types were placed in this encounter.   All questions were answered. The patient knows to call the clinic with any problems, questions or concerns. The total time spent in the appointment was 20 minutes encounter with patients including review of chart and various tests results, discussions about plan of care and coordination of care plan   Artis Delay, MD 09/28/2023 10:40 AM  INTERVAL HISTORY: Please see below for problem oriented charting. she returns for surveillance follow-up for MGUS She is doing well No new complaints We spent a lot of time reviewing her test results I am impressed by improvement of her labs over the last few years including her renal function  REVIEW OF SYSTEMS:   Constitutional: Denies fevers, chills or abnormal weight loss Eyes: Denies blurriness of vision Ears, nose, mouth, throat, and face: Denies mucositis or sore throat Respiratory: Denies cough, dyspnea or wheezes Cardiovascular: Denies palpitation, chest discomfort or lower extremity swelling Gastrointestinal:  Denies nausea, heartburn or change in bowel habits Skin: Denies abnormal skin rashes Lymphatics: Denies new lymphadenopathy or easy bruising Neurological:Denies numbness, tingling or new weaknesses Behavioral/Psych: Mood is stable, no new changes  All other systems were reviewed with  the patient and are negative.  I have reviewed the past medical history, past surgical history, social history and family history with the patient and they are unchanged from previous note.  ALLERGIES:  has No Known Allergies.  MEDICATIONS:  Current Outpatient Medications  Medication Sig Dispense Refill   amLODipine (NORVASC) 10 MG tablet Take 1 tablet (10 mg total) by mouth daily. Needs office visit for more refills 90 tablet 3   atorvastatin (LIPITOR) 10 MG tablet Take 10 mg by mouth at bedtime.     baclofen (LIORESAL) 10 MG tablet Take 10 mg by mouth 3 (three) times daily as needed.     etanercept (ENBREL MINI) 50 MG/ML injection Inject 50 mg into the skin once a week. 12 mL 0   lidocaine (XYLOCAINE) 5 % ointment Apply topically.     lisinopril-hydrochlorothiazide (ZESTORETIC) 20-12.5 MG tablet Take 2 tablets by mouth daily.     NP THYROID 60 MG tablet TAKE 1 TABLET BY MOUTH ONCE DAILY ON AN EMPTY STOMACH (Patient not taking: Reported on 06/23/2023)     sulfaSALAzine (AZULFIDINE) 500 MG tablet Take 2 tablets (1,000 mg total) by mouth 2 (two) times daily. 360 tablet 0   Vitamin D, Ergocalciferol, (DRISDOL) 1.25 MG (50000 UNIT) CAPS capsule Take 1 capsule (50,000 Units total) by mouth every 7 (seven) days. 5 capsule 5   No current facility-administered medications for this visit.    SUMMARY OF ONCOLOGIC HISTORY: Oncology History   No history exists.    PHYSICAL EXAMINATION: ECOG PERFORMANCE STATUS: 0 - Asymptomatic  Vitals:   09/28/23 0900  BP: 119/76  Pulse: 89  Resp: 18  Temp: 98.3 F (36.8 C)  SpO2: 99%   Filed Weights   09/28/23 0900  Weight: (!) 317 lb 6.4 oz (144 kg)    GENERAL:alert, no distress and comfortable  LABORATORY DATA:  I have reviewed the data as listed    Component Value Date/Time   NA 141 09/17/2023 0919   NA 143 04/24/2021 1223   NA 142 08/05/2017 1057   K 3.8 09/17/2023 0919   K 2.7 (LL) 08/05/2017 1057   CL 106 09/17/2023 0919   CO2 30  09/17/2023 0919   CO2 33 (H) 08/05/2017 1057   GLUCOSE 85 09/17/2023 0919   GLUCOSE 103 08/05/2017 1057   BUN 14 09/17/2023 0919   BUN 14 04/24/2021 1223   BUN 31.2 (H) 08/05/2017 1057   CREATININE 1.00 09/17/2023 0919   CREATININE 1.12 (H) 06/23/2023 1333   CREATININE 1.8 (H) 08/05/2017 1057   CALCIUM 9.4 09/17/2023 0919   CALCIUM 10.4 08/05/2017 1057   PROT 7.7 09/17/2023 0919   PROT 7.5 04/24/2021 1223   PROT 8.3 08/05/2017 1057   ALBUMIN 4.0 09/17/2023 0919   ALBUMIN 4.1 04/24/2021 1223   ALBUMIN 3.2 (L) 08/05/2017 1057   AST 16 09/17/2023 0919   AST 18 08/05/2017 1057   ALT 7 09/17/2023 0919   ALT 10 08/05/2017 1057   ALKPHOS 58 09/17/2023 0919   ALKPHOS 62 08/05/2017 1057   BILITOT 0.4 09/17/2023 0919   BILITOT 0.4 04/24/2021 1223   BILITOT 0.46 08/05/2017 1057   GFRNONAA >60 09/17/2023 0919   GFRNONAA 58 (L) 07/06/2019 1010   GFRAA >60 09/02/2020 1051   GFRAA 68 07/06/2019 1010    No results found for: "SPEP", "UPEP"  Lab Results  Component Value Date   WBC 7.7 09/17/2023   NEUTROABS 3.0 09/17/2023   HGB 12.1 09/17/2023   HCT 37.0 09/17/2023   MCV 105.7 (H) 09/17/2023   PLT 243 09/17/2023      Chemistry      Component Value Date/Time   NA 141 09/17/2023 0919   NA 143 04/24/2021 1223   NA 142 08/05/2017 1057   K 3.8 09/17/2023 0919   K 2.7 (LL) 08/05/2017 1057   CL 106 09/17/2023 0919   CO2 30 09/17/2023 0919   CO2 33 (H) 08/05/2017 1057   BUN 14 09/17/2023 0919   BUN 14 04/24/2021 1223   BUN 31.2 (H) 08/05/2017 1057   CREATININE 1.00 09/17/2023 0919   CREATININE 1.12 (H) 06/23/2023 1333   CREATININE 1.8 (H) 08/05/2017 1057      Component Value Date/Time   CALCIUM 9.4 09/17/2023 0919   CALCIUM 10.4 08/05/2017 1057   ALKPHOS 58 09/17/2023 0919   ALKPHOS 62 08/05/2017 1057   AST 16 09/17/2023 0919   AST 18 08/05/2017 1057   ALT 7 09/17/2023 0919   ALT 10 08/05/2017 1057   BILITOT 0.4 09/17/2023 0919   BILITOT 0.4 04/24/2021 1223   BILITOT  0.46 08/05/2017 1057

## 2023-10-07 ENCOUNTER — Other Ambulatory Visit: Payer: Self-pay | Admitting: *Deleted

## 2023-10-07 MED ORDER — SULFASALAZINE 500 MG PO TABS: 1000.0000 mg | ORAL_TABLET | Freq: Two times a day (BID) | ORAL | 0 refills | Status: DC

## 2023-10-07 NOTE — Telephone Encounter (Signed)
Refill request received via fax from [facility]  for Sulfasalazine   Last Fill: 06/23/2023  Labs: 09/24/2023 Potassium 3.0, Anion Gap 5, Glucose 112, GFR 24, WBC 11.20, RBC 3.49, Hgb 12.0, MCV 103.2, MCH 34.3  Next Visit: 11/25/2023  Last Visit: 06/23/2023  DX: Rheumatoid arthritis involving multiple sites with positive rheumatoid factor   Current Dose per office note 06/23/2023: Sulfasalazine 500 mg 2 tablets by mouth twice daily.   Okay to refill Sulfasalazine?

## 2023-10-13 ENCOUNTER — Other Ambulatory Visit: Payer: Self-pay

## 2023-10-18 ENCOUNTER — Other Ambulatory Visit: Payer: Self-pay | Admitting: Physician Assistant

## 2023-10-18 ENCOUNTER — Other Ambulatory Visit: Payer: Self-pay

## 2023-10-18 ENCOUNTER — Other Ambulatory Visit (HOSPITAL_COMMUNITY): Payer: Self-pay

## 2023-10-18 DIAGNOSIS — M0579 Rheumatoid arthritis with rheumatoid factor of multiple sites without organ or systems involvement: Secondary | ICD-10-CM

## 2023-10-18 DIAGNOSIS — Z79899 Other long term (current) drug therapy: Secondary | ICD-10-CM

## 2023-10-18 MED ORDER — ENBREL MINI 50 MG/ML ~~LOC~~ SOCT
50.0000 mg | SUBCUTANEOUS | 0 refills | Status: DC
  Filled 2023-10-18: qty 4, 28d supply, fill #0

## 2023-10-18 NOTE — Telephone Encounter (Signed)
Last Fill: 07/29/2023  Labs: 09/24/2023 Potassium 3.0, Anion Gap 5, Glucose 112, GFR 54, WBC 11.20, RBC 3.49, Hgb 12.0, MCV 103.2, MCH 34.3  TB Gold: 08/14/2022 neg   Next Visit: 11/25/2023  Last Visit: 06/23/2023  WR:[id number] arthritis involving multiple sites with positive rheumatoid factor   Current Dose per office note 06/23/2023: Enbrel 50 mg sq injections once weekly   Patient advised she is due to update her TB Gold. Patient states she will update it on Thursday.   Okay to refill Enbrel?

## 2023-10-18 NOTE — Progress Notes (Signed)
Specialty Pharmacy Ongoing Clinical Assessment Note  Alexandra Powers is a 52 y.o. female who is being followed by the specialty pharmacy service for RxSp Rheumatoid Arthritis   Patient's specialty medication(s) reviewed today: Etanercept   Missed doses in the last 4 weeks: 0   Patient/Caregiver did not have any additional questions or concerns.   Therapeutic benefit summary: Patient is achieving benefit   Adverse events/side effects summary: No adverse events/side effects   Patient's therapy is appropriate to: Continue    Goals Addressed             This Visit's Progress    Minimize recurrence of flares       Patient is on track. Patient will maintain adherence.  Alexandra Powers reports that she is well-controlled and has had no recent flares.          Follow up:  6 months  Servando Snare Specialty Pharmacist

## 2023-10-18 NOTE — Progress Notes (Signed)
Specialty Pharmacy Refill Coordination Note  Alexandra Powers is a 52 y.o. female contacted today regarding refills of specialty medication(s) Etanercept   Patient requested Delivery   Delivery date: 10/26/23   Verified address: [address]  [place] [address]   Medication will be filled on 10/25/23. *Pending refill request*

## 2023-10-21 ENCOUNTER — Other Ambulatory Visit: Payer: Self-pay | Admitting: *Deleted

## 2023-10-21 DIAGNOSIS — Z111 Encounter for screening for respiratory tuberculosis: Secondary | ICD-10-CM

## 2023-10-21 DIAGNOSIS — Z79899 Other long term (current) drug therapy: Secondary | ICD-10-CM

## 2023-10-25 ENCOUNTER — Other Ambulatory Visit: Payer: Self-pay

## 2023-10-25 LAB — QUANTIFERON-TB GOLD PLUS
Mitogen-NIL: 6.82 [IU]/mL
NIL: 0.01 [IU]/mL
QuantiFERON-TB Gold Plus: NEGATIVE
TB1-NIL: 0.1 [IU]/mL
TB2-NIL: 0.21 [IU]/mL

## 2023-10-25 NOTE — Progress Notes (Signed)
TB gold negative

## 2023-11-01 ENCOUNTER — Other Ambulatory Visit (HOSPITAL_COMMUNITY): Payer: Self-pay

## 2023-11-11 NOTE — Progress Notes (Unsigned)
Office Visit Note  Patient: Alexandra Powers             Date of Birth: 12/12/1970           MRN: [id number]             PCP: Barbette Merino, NP Referring: Barbette Merino, NP Visit Date: 11/25/2023 Occupation: @GUAROCC @  Subjective:    History of Present Illness: Alexandra Powers is a 52 y.o. female with history of seropositive rheumatoid arthritis and osteoarthritis.  Patient remains on Enbrel 50 mg sq injections once weekly (initiated on August 31, 2022) and Sulfasalazine 500 mg 2 tablets by mouth twice daily.   TB gold negative on 10/21/23.  CBC and CMP updated on 09/24/23.  Her next lab work will be due in January and every 3 months.  Discussed the importance of holding enbrel and sulfsalazine if she develops signs or symptoms of an infection and to resume once the infection has completley cleared.   Activities of Daily Living:  Patient reports morning stiffness for *** {minute/hour:19697}.   Patient {ACTIONS;DENIES/REPORTS:21021675::"Denies"} nocturnal pain.  Difficulty dressing/grooming: {ACTIONS;DENIES/REPORTS:21021675::"Denies"} Difficulty climbing stairs: {ACTIONS;DENIES/REPORTS:21021675::"Denies"} Difficulty getting out of chair: {ACTIONS;DENIES/REPORTS:21021675::"Denies"} Difficulty using hands for taps, buttons, cutlery, and/or writing: {ACTIONS;DENIES/REPORTS:21021675::"Denies"}  No Rheumatology ROS completed.   PMFS History:  Patient Active Problem List   Diagnosis Date Noted   CKD (chronic kidney disease), stage III (HCC) 09/09/2020   Preventive measure 09/09/2020   CKD (chronic kidney disease), stage II 09/08/2019   Trochanteric bursitis of left hip 09/08/2019   Anxiety and depression 09/01/2019   Chronic pain syndrome 08/08/2019   Chronic myofascial pain 08/08/2019   Bilateral breast cysts 08/01/2019   Cellulitis of left breast 07/14/2019   Other rheumatoid arthritis with rheumatoid factor of left hip (HCC) 07/14/2019   Primary osteoarthritis of both hips  07/29/2018   Primary osteoarthritis of both knees 07/29/2018   Primary osteoarthritis of both feet 07/29/2018   Tonsillitis 05/02/2018   Acute prerenal failure (HCC) 09/03/2017   OSA (obstructive sleep apnea) 12/10/2016   GERD (gastroesophageal reflux disease) 12/04/2016   Low back pain at multiple sites 08/21/2016   Chronic pain of multiple joints 08/13/2016   MGUS (monoclonal gammopathy of unknown significance) 07/02/2016   Vitamin D deficiency 07/02/2016   HTN (hypertension), benign 11/11/2015   Morbid obesity (HCC) 11/11/2015   Adrenal mass (HCC) 08/30/2015   Ovarian cyst 08/30/2015    Past Medical History:  Diagnosis Date   Arthritis    Gallstones    GERD (gastroesophageal reflux disease)    Hypertension    Mass of adrenal gland (HCC)    MGUS (monoclonal gammopathy of unknown significance)    Obesity    Osteoarthritis    PONV (postoperative nausea and vomiting)    Rheumatoid arthritis (HCC)    Sleep apnea    SENT CPAP BACK, NOT WORN IN 7-8 MONTHS    Family History  Problem Relation Age of Onset   Arthritis Mother    High blood pressure Mother    Sleep apnea Mother    High blood pressure Father    Breast cancer Paternal Aunt    Arthritis Sister    Healthy Daughter    Healthy Son    Adrenal disorder Neg Hx    Colon cancer Neg Hx    Esophageal cancer Neg Hx    Pancreatic cancer Neg Hx    Stomach cancer Neg Hx    Liver disease Neg Hx    Past Surgical  History:  Procedure Laterality Date   BREAST DUCTAL SYSTEM EXCISION Bilateral 10/12/2017   Procedure: EXCISION OF BILATERAL CENTRAL DUCT;  Surgeon: Harriette Bouillon, MD;  Location: [facility];  Service: General;  Laterality: Bilateral;   BREAST EXCISIONAL BIOPSY     CHOLECYSTECTOMY N/A 12/21/2015   Procedure: LAPAROSCOPIC CHOLECYSTECTOMY;  Surgeon: Violeta Gelinas, MD;  Location: [facility] OR;  Service: General;  Laterality: N/A;   UMBILICAL HERNIA REPAIR N/A 12/27/2015   Procedure: INCARCERATED UMBILICAL HERNIA REPAIR;  Surgeon:  Abigail Miyamoto, MD;  Location: [facility] OR;  Service: General;  Laterality: N/A;   Social History   Social History Narrative   Lives with husband, Earvin Hansen.   Immunization History  Administered Date(s) Administered   Influenza,inj,Quad PF,6+ Mos 08/29/2015, 08/21/2016, 08/31/2017, 08/09/2018, 08/01/2019, 09/09/2020, 09/25/2022   Influenza,inj,quad, With Preservative 08/30/2017   PFIZER(Purple Top)SARS-COV-2 Vaccination 02/16/2020, 03/08/2020, 12/07/2020   Tdap 08/29/2015     Objective: Vital Signs: LMP 04/16/2019    Physical Exam Vitals and nursing note reviewed.  Constitutional:      Appearance: She is well-developed.  HENT:     Head: Normocephalic and atraumatic.  Eyes:     Conjunctiva/sclera: Conjunctivae normal.  Cardiovascular:     Rate and Rhythm: Normal rate and regular rhythm.     Heart sounds: Normal heart sounds.  Pulmonary:     Effort: Pulmonary effort is normal.     Breath sounds: Normal breath sounds.  Abdominal:     General: Bowel sounds are normal.     Palpations: Abdomen is soft.  Musculoskeletal:     Cervical back: Normal range of motion.  Lymphadenopathy:     Cervical: No cervical adenopathy.  Skin:    General: Skin is warm and dry.     Capillary Refill: Capillary refill takes less than 2 seconds.  Neurological:     Mental Status: She is alert and oriented to person, place, and time.  Psychiatric:        Behavior: Behavior normal.      Musculoskeletal Exam: ***  CDAI Exam: CDAI Score: -- Patient Global: --; Provider Global: -- Swollen: --; Tender: -- Joint Exam 11/25/2023   No joint exam has been documented for this visit   There is currently no information documented on the homunculus. Go to the Rheumatology activity and complete the homunculus joint exam.  Investigation: No additional findings.  Imaging: No results found.  Recent Labs: Lab Results  Component Value Date   WBC 7.7 09/17/2023   HGB 12.1 09/17/2023   PLT 243 09/17/2023    NA 141 09/17/2023   K 3.8 09/17/2023   CL 106 09/17/2023   CO2 30 09/17/2023   GLUCOSE 85 09/17/2023   BUN 14 09/17/2023   CREATININE 1.00 09/17/2023   BILITOT 0.4 09/17/2023   ALKPHOS 58 09/17/2023   AST 16 09/17/2023   ALT 7 09/17/2023   PROT 7.7 09/17/2023   ALBUMIN 4.0 09/17/2023   CALCIUM 9.4 09/17/2023   GFRAA >60 09/02/2020   QFTBGOLDPLUS NEGATIVE 10/21/2023    Speciality Comments: PLQ Eye Exam: 08/12/18 WNL @ [facility] Follow up in 1 year.  ENbrel started 08/31/22  Procedures:  No procedures performed Allergies: Patient has no known allergies.   Assessment / Plan:     Visit Diagnoses: Rheumatoid arthritis involving multiple sites with positive rheumatoid factor (HCC)  High risk medication use  Positive QuantiFERON-TB Gold test  Primary osteoarthritis of both hands  Pain in left wrist  Primary osteoarthritis of both hips  Trochanteric bursitis, left  hip  Primary osteoarthritis of both knees  Primary osteoarthritis of both feet  HTN (hypertension), benign  Gastroesophageal reflux disease, unspecified whether esophagitis present  MGUS (monoclonal gammopathy of unknown significance)  Vitamin D deficiency  OSA (obstructive sleep apnea)  Orders: No orders of the defined types were placed in this encounter.  No orders of the defined types were placed in this encounter.   Face-to-face time spent with patient was *** minutes. Greater than 50% of time was spent in counseling and coordination of care.  Follow-Up Instructions: No follow-ups on file.   Gearldine Bienenstock, PA-C  Note - This record has been created using Dragon software.  Chart creation errors have been sought, but may not always  have been located. Such creation errors do not reflect on  the standard of medical care.

## 2023-11-18 ENCOUNTER — Other Ambulatory Visit: Payer: Self-pay

## 2023-11-18 ENCOUNTER — Other Ambulatory Visit: Payer: Self-pay | Admitting: Physician Assistant

## 2023-11-18 DIAGNOSIS — M0579 Rheumatoid arthritis with rheumatoid factor of multiple sites without organ or systems involvement: Secondary | ICD-10-CM

## 2023-11-18 DIAGNOSIS — Z79899 Other long term (current) drug therapy: Secondary | ICD-10-CM

## 2023-11-18 MED ORDER — ENBREL MINI 50 MG/ML ~~LOC~~ SOCT
50.0000 mg | SUBCUTANEOUS | 0 refills | Status: DC
  Filled 2023-11-18: qty 4, 28d supply, fill #0
  Filled 2023-12-16: qty 4, 28d supply, fill #1
  Filled 2024-01-07: qty 4, 28d supply, fill #2

## 2023-11-18 NOTE — Telephone Encounter (Signed)
Last Fill: 10/18/2023 (30 supply)  Labs: 09/24/2023 Potassium 3.0, Anion Gap 5, Glucose 112, GFR 54, WBC 11.20, RBC 3.49, Hgb 12.0, MCV 103.2, MCH 34.3  TB Gold: 10/21/2023 Neg    Next Visit: 11/25/2023  Last Visit: 06/23/2023  [id number] arthritis involving multiple sites with positive rheumatoid factor   Current Dose per office note 06/23/2023: Enbrel 50 mg sq injections once weekly   Okay to refill Enbrel?

## 2023-11-18 NOTE — Progress Notes (Signed)
Specialty Pharmacy Refill Coordination Note  Alexandra Powers is a 52 y.o. female contacted today regarding refills of specialty medication(s) Etanercept (Enbrel Mini)   Patient requested Delivery   Delivery date: 11/26/23   Verified address: [address]  [place] [address]   Medication will be filled on 12.26.24.  Refill request pending

## 2023-11-25 ENCOUNTER — Encounter: Payer: Self-pay | Admitting: Physician Assistant

## 2023-11-25 ENCOUNTER — Ambulatory Visit: Payer: Medicaid Other | Attending: Physician Assistant | Admitting: Physician Assistant

## 2023-11-25 ENCOUNTER — Telehealth: Payer: Self-pay | Admitting: *Deleted

## 2023-11-25 ENCOUNTER — Other Ambulatory Visit: Payer: Self-pay

## 2023-11-25 VITALS — BP 103/74 | HR 84 | Resp 14 | Ht 68.0 in | Wt 315.0 lb

## 2023-11-25 DIAGNOSIS — G4733 Obstructive sleep apnea (adult) (pediatric): Secondary | ICD-10-CM

## 2023-11-25 DIAGNOSIS — M19041 Primary osteoarthritis, right hand: Secondary | ICD-10-CM | POA: Diagnosis not present

## 2023-11-25 DIAGNOSIS — M19072 Primary osteoarthritis, left ankle and foot: Secondary | ICD-10-CM

## 2023-11-25 DIAGNOSIS — M19042 Primary osteoarthritis, left hand: Secondary | ICD-10-CM

## 2023-11-25 DIAGNOSIS — M0579 Rheumatoid arthritis with rheumatoid factor of multiple sites without organ or systems involvement: Secondary | ICD-10-CM

## 2023-11-25 DIAGNOSIS — M7062 Trochanteric bursitis, left hip: Secondary | ICD-10-CM

## 2023-11-25 DIAGNOSIS — D472 Monoclonal gammopathy: Secondary | ICD-10-CM

## 2023-11-25 DIAGNOSIS — Z79899 Other long term (current) drug therapy: Secondary | ICD-10-CM

## 2023-11-25 DIAGNOSIS — R7612 Nonspecific reaction to cell mediated immunity measurement of gamma interferon antigen response without active tuberculosis: Secondary | ICD-10-CM

## 2023-11-25 DIAGNOSIS — K219 Gastro-esophageal reflux disease without esophagitis: Secondary | ICD-10-CM

## 2023-11-25 DIAGNOSIS — M160 Bilateral primary osteoarthritis of hip: Secondary | ICD-10-CM

## 2023-11-25 DIAGNOSIS — M25532 Pain in left wrist: Secondary | ICD-10-CM

## 2023-11-25 DIAGNOSIS — I10 Essential (primary) hypertension: Secondary | ICD-10-CM

## 2023-11-25 DIAGNOSIS — E559 Vitamin D deficiency, unspecified: Secondary | ICD-10-CM

## 2023-11-25 DIAGNOSIS — M170 Bilateral primary osteoarthritis of knee: Secondary | ICD-10-CM

## 2023-11-25 DIAGNOSIS — M19071 Primary osteoarthritis, right ankle and foot: Secondary | ICD-10-CM

## 2023-11-25 NOTE — Telephone Encounter (Signed)
Patient advised she needs a chest x-ray. Patient advised the order has been placed.

## 2023-11-25 NOTE — Patient Instructions (Signed)

## 2023-12-16 ENCOUNTER — Other Ambulatory Visit: Payer: Self-pay

## 2023-12-16 NOTE — Progress Notes (Signed)
Specialty Pharmacy Refill Coordination Note  Alexandra Powers is a 53 y.o. female contacted today regarding refills of specialty medication(s) Etanercept (Enbrel Mini)   Patient requested Delivery   Delivery date: 12/21/23   Verified address: [address]   [place] [address]   Medication will be filled on 12/20/23.

## 2023-12-17 ENCOUNTER — Other Ambulatory Visit: Payer: Self-pay

## 2023-12-20 ENCOUNTER — Other Ambulatory Visit: Payer: Self-pay

## 2024-01-05 ENCOUNTER — Other Ambulatory Visit: Payer: Self-pay | Admitting: Physician Assistant

## 2024-01-05 DIAGNOSIS — Z79899 Other long term (current) drug therapy: Secondary | ICD-10-CM

## 2024-01-05 NOTE — Telephone Encounter (Signed)
 Last Fill: 10/07/2023  Labs:  09/24/2023 Potassium 3.0, Anion Gap 5, Glucose 112, GFR 24, WBC 11.20, RBC 3.49, Hgb 12.0, MCV 103.2, MCH 34.3   Next Visit: 04/26/2024  Last Visit: 11/25/2023  DX: Rheumatoid arthritis involving multiple sites with positive rheumatoid factor   Current Dose per office note 11/25/2023: Sulfasalazine  500 mg 2 tablets by mouth twice daily.   Patient advised she is due to update her lab work. Patient will update her labs tomorrow.   Okay to refill Sulfasalazine ?

## 2024-01-07 ENCOUNTER — Other Ambulatory Visit: Payer: Self-pay

## 2024-01-07 ENCOUNTER — Other Ambulatory Visit (HOSPITAL_COMMUNITY): Payer: Self-pay

## 2024-01-07 NOTE — Progress Notes (Signed)
 Specialty Pharmacy Refill Coordination Note  Alexandra Powers is a 53 y.o. female contacted today regarding refills of specialty medication(s) Etanercept  (Enbrel  Mini)   Patient requested Delivery   Delivery date: 01/25/24   Verified address: [address]   [place] [address]   Medication will be filled on 01/24/24.

## 2024-01-24 ENCOUNTER — Telehealth: Payer: Self-pay | Admitting: Rheumatology

## 2024-01-24 NOTE — Telephone Encounter (Signed)
 Patient contacted the office to request a medication refill.   1. Name of Medication: Enbrel  2. How are you currently taking this medication (dosage and times per day)? Once a week   3. What pharmacy would you like for that to be sent to? [facility]  Patient states she was due to take her injection today, but is out of medication.

## 2024-01-24 NOTE — Telephone Encounter (Signed)
 Review of patient's chart shows prescription will be delivered to patient on 01/25/2024. Patient advised.

## 2024-02-14 ENCOUNTER — Other Ambulatory Visit: Payer: Self-pay | Admitting: Physician Assistant

## 2024-02-14 ENCOUNTER — Other Ambulatory Visit: Payer: Self-pay

## 2024-02-14 ENCOUNTER — Other Ambulatory Visit (HOSPITAL_COMMUNITY): Payer: Self-pay

## 2024-02-14 DIAGNOSIS — M0579 Rheumatoid arthritis with rheumatoid factor of multiple sites without organ or systems involvement: Secondary | ICD-10-CM

## 2024-02-14 DIAGNOSIS — Z79899 Other long term (current) drug therapy: Secondary | ICD-10-CM

## 2024-02-14 MED ORDER — ENBREL MINI 50 MG/ML ~~LOC~~ SOCT
50.0000 mg | SUBCUTANEOUS | 0 refills | Status: DC
  Filled 2024-02-14: qty 4, 28d supply, fill #0
  Filled 2024-03-13: qty 4, 28d supply, fill #1
  Filled 2024-04-07: qty 4, 28d supply, fill #2

## 2024-02-14 NOTE — Telephone Encounter (Signed)
 Last Fill: 11/18/2023  Labs: 01/05/2024  RBC 3.63 MCV 105 MCH 34.4 Lymphs 4.6 Creatinine 1.05  TB Gold: 10/21/2023  TB gold negative   Next Visit: 04/26/2024  Last Visit: 11/25/2023  [id number] arthritis involving multiple sites with positive rheumatoid factor (HCC)   Current Dose per office note 11/25/2023: Enbrel 50 mg sq injections once weekly   Okay to refill Enbrel?

## 2024-02-14 NOTE — Progress Notes (Signed)
 Specialty Pharmacy Refill Coordination Note  Alexandra Powers is a 53 y.o. female contacted today regarding refills of specialty medication(s) Etanercept (Enbrel Mini)   Patient requested Delivery   Delivery date: 02/18/24   Verified address: [address]   [place]  [address]   Medication will be filled on 02/17/24.

## 2024-02-17 ENCOUNTER — Other Ambulatory Visit: Payer: Self-pay

## 2024-03-13 ENCOUNTER — Other Ambulatory Visit: Payer: Self-pay

## 2024-03-13 NOTE — Progress Notes (Signed)
 Specialty Pharmacy Refill Coordination Note  Alexandra Powers is a 53 y.o. female contacted today regarding refills of specialty medication(s) Etanercept (Enbrel Mini)   Patient requested Delivery   Delivery date: 03/17/24   Verified address: [address]   [place] [address]   Medication will be filled on 03/16/24.

## 2024-03-16 ENCOUNTER — Other Ambulatory Visit: Payer: Self-pay

## 2024-04-05 ENCOUNTER — Other Ambulatory Visit: Payer: Self-pay | Admitting: Physician Assistant

## 2024-04-05 NOTE — Telephone Encounter (Signed)
 Last Fill: 01/05/2024  Labs: 12/09/2023 CBC and BMP ([facility] tab)  RBC 3.63 MCV 105 MCH 34.4 Lymph 4.6 Creatinine 1.05  Next Visit: 04/26/2024  Last Visit: 11/25/2023  DX: Rheumatoid arthritis involving multiple sites with positive rheumatoid factor   Current Dose per office note 11/25/2023: Sulfasalazine  500 mg 2 tablets by mouth twice daily  Patient update labs at upcoming appointment  Okay to refill Sulfasalazine ?

## 2024-04-07 ENCOUNTER — Other Ambulatory Visit: Payer: Self-pay

## 2024-04-07 ENCOUNTER — Other Ambulatory Visit: Payer: Self-pay | Admitting: Pharmacy Technician

## 2024-04-07 NOTE — Progress Notes (Signed)
 Specialty Pharmacy Refill Coordination Note  Alexandra Powers is a 53 y.o. female contacted today regarding refills of specialty medication(s) Etanercept  (Enbrel  Mini)   Patient requested Delivery   Delivery date: (Patient-Rptd) 04/11/24   Verified address: [address] [place], [place] [address]   Medication will be filled on 04/10/24.

## 2024-04-10 ENCOUNTER — Other Ambulatory Visit: Payer: Self-pay

## 2024-04-13 NOTE — Progress Notes (Signed)
 Office Visit Note  Patient: Alexandra Powers             Date of Birth: 09/30/1971           MRN: [id number]             PCP: Margaree Shark, PA-C Referring: Margaree Shark, PA-C Visit Date: 04/26/2024 Occupation: @GUAROCC @  Subjective:  Left wrist pain   History of Present Illness: Alexandra Powers is a 54 y.o. female with seropositive rheumatoid arthritis and osteoarthritis.  She returns today after her last visit in December 2024.  She continues to have some discomfort in her left wrist, both hips and her knee joints.  He did not have much relief from the viscosupplement injections.  She has been using a rollator walker.  She has not noticed much swelling.  She has been on [facility] for 4 months now.  She states she had intentional weight loss of about 40 pounds.  She has been taking Enbrel  50 mg subcu weekly and sulfasalazine  500 mg , 2 tablets p.o. twice daily.  She has been taking both medications on a regular basis without any interruption.    Activities of Daily Living:  Patient reports morning stiffness for 30-45 minutes.   Patient Reports nocturnal pain.  Difficulty dressing/grooming: Reports Difficulty climbing stairs: Reports Difficulty getting out of chair: Reports Difficulty using hands for taps, buttons, cutlery, and/or writing: Reports  Review of Systems  Constitutional:  Negative for fatigue.  HENT:  Negative for mouth sores and mouth dryness.   Eyes:  Negative for dryness.  Respiratory:  Negative for shortness of breath.   Cardiovascular:  Negative for chest pain and palpitations.  Gastrointestinal:  Positive for blood in stool. Negative for constipation and diarrhea.  Endocrine: Negative for increased urination.  Genitourinary:  Negative for involuntary urination.  Musculoskeletal:  Positive for joint pain, gait problem, joint pain, joint swelling, myalgias, morning stiffness, muscle tenderness and myalgias. Negative for muscle weakness.  Skin:  Negative for color  change, rash, hair loss and sensitivity to sunlight.  Allergic/Immunologic: Negative for susceptible to infections.  Neurological:  Negative for dizziness and headaches.  Hematological:  Negative for swollen glands.  Psychiatric/Behavioral:  Positive for sleep disturbance. Negative for depressed mood. The patient is not nervous/anxious.     PMFS History:  Patient Active Problem List   Diagnosis Date Noted   CKD (chronic kidney disease), stage III (HCC) 09/09/2020   Preventive measure 09/09/2020   CKD (chronic kidney disease), stage II 09/08/2019   Trochanteric bursitis of left hip 09/08/2019   Anxiety and depression 09/01/2019   Chronic pain syndrome 08/08/2019   Chronic myofascial pain 08/08/2019   Bilateral breast cysts 08/01/2019   Cellulitis of left breast 07/14/2019   Other rheumatoid arthritis with rheumatoid factor of left hip (HCC) 07/14/2019   Primary osteoarthritis of both hips 07/29/2018   Primary osteoarthritis of both knees 07/29/2018   Primary osteoarthritis of both feet 07/29/2018   Tonsillitis 05/02/2018   Acute prerenal failure (HCC) 09/03/2017   OSA (obstructive sleep apnea) 12/10/2016   GERD (gastroesophageal reflux disease) 12/04/2016   Low back pain at multiple sites 08/21/2016   Chronic pain of multiple joints 08/13/2016   MGUS (monoclonal gammopathy of unknown significance) 07/02/2016   Vitamin D  deficiency 07/02/2016   HTN (hypertension), benign 11/11/2015   Morbid obesity (HCC) 11/11/2015   Adrenal mass (HCC) 08/30/2015   Ovarian cyst 08/30/2015    Past Medical History:  Diagnosis Date  Arthritis    Gallstones    GERD (gastroesophageal reflux disease)    Hypertension    Mass of adrenal gland (HCC)    MGUS (monoclonal gammopathy of unknown significance)    Obesity    Osteoarthritis    PONV (postoperative nausea and vomiting)    Rheumatoid arthritis (HCC)    Sleep apnea    SENT CPAP BACK, NOT WORN IN 7-8 MONTHS    Family History  Problem  Relation Age of Onset   Arthritis Mother    High blood pressure Mother    Sleep apnea Mother    High blood pressure Father    Arthritis Sister    Breast cancer Paternal Aunt    Healthy Daughter    Healthy Son    Adrenal disorder Neg Hx    Colon cancer Neg Hx    Esophageal cancer Neg Hx    Pancreatic cancer Neg Hx    Stomach cancer Neg Hx    Liver disease Neg Hx    Past Surgical History:  Procedure Laterality Date   BREAST DUCTAL SYSTEM EXCISION Bilateral 10/12/2017   Procedure: EXCISION OF BILATERAL CENTRAL DUCT;  Surgeon: Sim Dryer, MD;  Location: [facility] OR;  Service: General;  Laterality: Bilateral;   BREAST EXCISIONAL BIOPSY     CHOLECYSTECTOMY N/A 12/21/2015   Procedure: LAPAROSCOPIC CHOLECYSTECTOMY;  Surgeon: Dorena Gander, MD;  Location: [facility] OR;  Service: General;  Laterality: N/A;   UMBILICAL HERNIA REPAIR N/A 12/27/2015   Procedure: INCARCERATED UMBILICAL HERNIA REPAIR;  Surgeon: Oza Blumenthal, MD;  Location: [facility];  Service: General;  Laterality: N/A;   Social History   Social History Narrative   Lives with husband, Doroteo Gasmen.   Immunization History  Administered Date(s) Administered   Influenza,inj,Quad PF,6+ Mos 08/29/2015, 08/21/2016, 08/31/2017, 08/09/2018, 08/01/2019, 09/09/2020, 09/25/2022   Influenza,inj,quad, With Preservative 08/30/2017   PFIZER(Purple Top)SARS-COV-2 Vaccination 02/16/2020, 03/08/2020, 12/07/2020   Tdap 08/29/2015     Objective: Vital Signs: BP 104/73 (BP Location: Left Arm, Patient Position: Sitting, Cuff Size: Large)   Pulse 83   Resp 16   Ht 5\' 8"  (1.727 m)   Wt 281 lb 6.4 oz (127.6 kg)   LMP 04/16/2019   BMI 42.79 kg/m    Physical Exam Vitals and nursing note reviewed.  Constitutional:      Appearance: She is well-developed.  HENT:     Head: Normocephalic and atraumatic.  Eyes:     Conjunctiva/sclera: Conjunctivae normal.  Cardiovascular:     Rate and Rhythm: Normal rate and regular rhythm.     Heart sounds: Normal  heart sounds.  Pulmonary:     Effort: Pulmonary effort is normal.     Breath sounds: Normal breath sounds.  Abdominal:     General: Bowel sounds are normal.     Palpations: Abdomen is soft.  Musculoskeletal:     Cervical back: Normal range of motion.  Lymphadenopathy:     Cervical: No cervical adenopathy.  Skin:    General: Skin is warm and dry.     Capillary Refill: Capillary refill takes less than 2 seconds.  Neurological:     Mental Status: She is alert and oriented to person, place, and time.  Psychiatric:        Behavior: Behavior normal.      Musculoskeletal Exam: Patient was examined in the seated position.  Cervical spine was in good range of motion.  Shoulders, elbows, wrist, MCPs PIPs and DIPs were in good range of motion.  She had mild left wrist  extensor tenosynovitis.  Hip joints were difficult to assess but she had discomfort with mild abduction of her hip joints.  Knee joints with good range of motion with discomfort.  No warmth swelling or effusion was noted.  She had no tenderness over ankles or MTPs.  CDAI Exam: CDAI Score: -- Patient Global: --; Provider Global: -- Swollen: --; Tender: -- Joint Exam 04/26/2024   No joint exam has been documented for this visit   There is currently no information documented on the homunculus. Go to the Rheumatology activity and complete the homunculus joint exam.  Investigation: No additional findings.  Imaging: No results found.  Recent Labs: Lab Results  Component Value Date   WBC 7.7 09/17/2023   HGB 12.1 09/17/2023   PLT 243 09/17/2023   NA 141 09/17/2023   K 3.8 09/17/2023   CL 106 09/17/2023   CO2 30 09/17/2023   GLUCOSE 85 09/17/2023   BUN 14 09/17/2023   CREATININE 1.00 09/17/2023   BILITOT 0.4 09/17/2023   ALKPHOS 58 09/17/2023   AST 16 09/17/2023   ALT 7 09/17/2023   PROT 7.7 09/17/2023   ALBUMIN 4.0 09/17/2023   CALCIUM 9.4 09/17/2023   GFRAA >60 09/02/2020   QFTBGOLDPLUS NEGATIVE 10/21/2023      Speciality Comments: PLQ Eye Exam: 08/12/18 WNL @ [facility] Follow up in 1 year.  ENbrel  started 08/31/22  Procedures:  No procedures performed Allergies: Patient has no known allergies.   Assessment / Plan:     Visit Diagnoses: Rheumatoid arthritis involving multiple sites with positive rheumatoid factor (HCC) - Positive RF, positive anti-CCP, positive elevated sedimentation rate, positive synovitis on ultrasound: She states she has been doing well on the combination of Enbrel  and sulfasalazine .  She has intermittent discomfort in her left wrist joint.  Mild extensor tenosynovitis was noted over the left wrist.  She continues to have discomfort in her hip joints and knee joints.  She states she had no response to viscosupplement injections in the past.  She denies any interruption in the treatment.  High risk medication use - Enbrel  50 mg sq injections once weekly initiated on August 31, 2022, Sulfasalazine  500 mg 2 tablets by mouth twice daily. -December 10, 2023 WBC 9.1, hemoglobin 12.5, platelet 291, BMP creatinine 1.05.  Plan: CBC with Differential/Platelet, Comprehensive metabolic panel with GFR today.  TB Gold was negative on October 21, 2023.  She was advised to get labs every 3 months to monitor for drug toxicity.  TB Gold will be checked annually.  Information immunization was placed in the AVS.  She was advised to hold Enbrel  and sulfasalazine  if she develops an infection and resume after the infection resolves.  Annual skin examination to screen for skin cancer was advised while she is on Enbrel .  Use of sunscreen and sun protection was advised.  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  Underwent treatment with rifampin/INH for 3 months prescribed [facility] the [facility].-year-old is negative known.  Primary osteoarthritis of both hands-she has mild osteoarthritis.  She complains of discomfort in her left wrist.  Pain in left wrist-mild close tenosynovitis was noted.   Patient believes is due to resting her chin on her left wrist.  She will try some modifications.  Primary osteoarthritis of both hips-she had some discomfort range of motion of her hip joints.  Trochanteric bursitis, left hip-currently no symptomatic.  Primary osteoarthritis of both knees-she had no response to viscosupplement injections per patient.  She continues to have pain  and discomfort in her knee joints.  She plans to have bilateral total knee replacement in the future after weight loss.  She is on Wegovy and had some intentional weight loss.  Primary osteoarthritis of both feet-she denies any discomfort.  HTN (hypertension), benign-blood pressure was normal at 104/73 today.  MGUS (monoclonal gammopathy of unknown significance)  Gastroesophageal reflux disease, unspecified whether esophagitis present  Vitamin D  deficiency  OSA (obstructive sleep apnea)  Dyslipidemia - Plan: Lipid panel  Orders: Orders Placed This Encounter  Procedures   CBC with Differential/Platelet   Comprehensive metabolic panel with GFR   Lipid panel   No orders of the defined types were placed in this encounter.    Follow-Up Instructions: Return in about 5 months (around 09/26/2024) for Rheumatoid arthritis, Osteoarthritis.   Nicholas Bari, MD  Note - This record has been created using Animal nutritionist.  Chart creation errors have been sought, but may not always  have been located. Such creation errors do not reflect on  the standard of medical care.

## 2024-04-26 ENCOUNTER — Encounter: Payer: Self-pay | Admitting: Rheumatology

## 2024-04-26 ENCOUNTER — Other Ambulatory Visit: Payer: Self-pay

## 2024-04-26 ENCOUNTER — Ambulatory Visit: Payer: Medicaid Other | Attending: Rheumatology | Admitting: Rheumatology

## 2024-04-26 VITALS — BP 104/73 | HR 83 | Resp 16 | Ht 68.0 in | Wt 281.4 lb

## 2024-04-26 DIAGNOSIS — D472 Monoclonal gammopathy: Secondary | ICD-10-CM

## 2024-04-26 DIAGNOSIS — M25532 Pain in left wrist: Secondary | ICD-10-CM

## 2024-04-26 DIAGNOSIS — E785 Hyperlipidemia, unspecified: Secondary | ICD-10-CM | POA: Diagnosis present

## 2024-04-26 DIAGNOSIS — I10 Essential (primary) hypertension: Secondary | ICD-10-CM | POA: Diagnosis present

## 2024-04-26 DIAGNOSIS — M19071 Primary osteoarthritis, right ankle and foot: Secondary | ICD-10-CM | POA: Diagnosis present

## 2024-04-26 DIAGNOSIS — M7062 Trochanteric bursitis, left hip: Secondary | ICD-10-CM | POA: Diagnosis present

## 2024-04-26 DIAGNOSIS — M170 Bilateral primary osteoarthritis of knee: Secondary | ICD-10-CM

## 2024-04-26 DIAGNOSIS — M160 Bilateral primary osteoarthritis of hip: Secondary | ICD-10-CM

## 2024-04-26 DIAGNOSIS — K219 Gastro-esophageal reflux disease without esophagitis: Secondary | ICD-10-CM

## 2024-04-26 DIAGNOSIS — G4733 Obstructive sleep apnea (adult) (pediatric): Secondary | ICD-10-CM

## 2024-04-26 DIAGNOSIS — Z79899 Other long term (current) drug therapy: Secondary | ICD-10-CM

## 2024-04-26 DIAGNOSIS — R7612 Nonspecific reaction to cell mediated immunity measurement of gamma interferon antigen response without active tuberculosis: Secondary | ICD-10-CM | POA: Diagnosis not present

## 2024-04-26 DIAGNOSIS — M0579 Rheumatoid arthritis with rheumatoid factor of multiple sites without organ or systems involvement: Secondary | ICD-10-CM | POA: Diagnosis present

## 2024-04-26 DIAGNOSIS — M19041 Primary osteoarthritis, right hand: Secondary | ICD-10-CM | POA: Diagnosis present

## 2024-04-26 DIAGNOSIS — E559 Vitamin D deficiency, unspecified: Secondary | ICD-10-CM | POA: Diagnosis present

## 2024-04-26 DIAGNOSIS — M19042 Primary osteoarthritis, left hand: Secondary | ICD-10-CM

## 2024-04-26 DIAGNOSIS — M19072 Primary osteoarthritis, left ankle and foot: Secondary | ICD-10-CM | POA: Diagnosis present

## 2024-04-26 NOTE — Patient Instructions (Signed)
 Standing Labs We placed an order today for your standing lab work.   Please have your standing labs drawn in August and every 3 months  Please have your labs drawn 2 weeks prior to your appointment so that the provider can discuss your lab results at your appointment, if possible.  Please note that you may see your imaging and lab results in MyChart before we have reviewed them. We will contact you once all results are reviewed. Please allow our office up to 72 hours to thoroughly review all of the results before contacting the office for clarification of your results.  WALK-IN LAB HOURS  Monday through Thursday from 8:00 am -12:30 pm and 1:00 pm-4:00 pm and Friday from 8:00 am-12:00 pm.  Patients with office visits requiring labs will be seen before walk-in labs.  You may encounter longer than normal wait times. Please allow additional time. Wait times may be shorter on  Monday and Thursday afternoons.  We do not book appointments for walk-in labs. We appreciate your patience and understanding with our staff.   Labs are drawn by [facility]. Please bring your co-pay at the time of your lab draw.  You may receive a bill from [facility] for your lab work.  Please note if you are on Hydroxychloroquine  and and an order has been placed for a Hydroxychloroquine  level,  you will need to have it drawn 4 hours or more after your last dose.  If you wish to have your labs drawn at another location, please call the office 24 hours in advance so we can fax the orders.  The office is located at [address], [address], [place], [place] [address]   If you have any questions regarding directions or hours of operation,  please call [phone].   As a reminder, please drink plenty of water prior to coming for your lab work. Thanks!   Vaccines You are taking a medication(s) that can suppress your immune system.  The following immunizations are recommended: Flu annually Covid-19  Td/Tdap (tetanus,  diphtheria, pertussis) every 10 years Pneumonia (Prevnar 15 then Pneumovax 23 at least 1 year apart.  Alternatively, can take Prevnar 20 without needing additional dose) Shingrix: 2 doses from 4 weeks to 6 months apart  Please check with your PCP to make sure you are up to date.   If you have signs or symptoms of an infection or start antibiotics: First, call your PCP for workup of your infection. Hold your medication through the infection, until you complete your antibiotics, and until symptoms resolve if you take the following: Injectable medication (Actemra, Benlysta, Cimzia, Cosentyx, Enbrel , Humira, Kevzara, Orencia, Remicade, Simponi, Stelara, Taltz, Tremfya) Methotrexate  Leflunomide  (Arava ) Mycophenolate (Cellcept) Cloria Danger, Olumiant, or Rinvoq   Please get an annual skin examination to screen for skin cancer while you are on Enbrel .  Please use sunscreen and sun protection.

## 2024-04-27 ENCOUNTER — Ambulatory Visit: Payer: Self-pay | Admitting: Rheumatology

## 2024-04-27 NOTE — Progress Notes (Signed)
 CBC is stable with elevated MCV.  CMP shows elevated creatinine and low potassium.  Most likely due to the use of diuretics.  Patient should contact her PCP regarding low potassium.  Lipid panel is normal.  Please add results to her PCP.

## 2024-05-08 ENCOUNTER — Other Ambulatory Visit: Payer: Self-pay | Admitting: Physician Assistant

## 2024-05-08 ENCOUNTER — Other Ambulatory Visit: Payer: Self-pay

## 2024-05-08 DIAGNOSIS — Z79899 Other long term (current) drug therapy: Secondary | ICD-10-CM

## 2024-05-08 DIAGNOSIS — M0579 Rheumatoid arthritis with rheumatoid factor of multiple sites without organ or systems involvement: Secondary | ICD-10-CM

## 2024-05-08 MED ORDER — ENBREL MINI 50 MG/ML ~~LOC~~ SOCT
50.0000 mg | SUBCUTANEOUS | 0 refills | Status: DC
  Filled 2024-05-08: qty 4, 28d supply, fill #0
  Filled 2024-06-05: qty 4, 28d supply, fill #1
  Filled 2024-07-05: qty 4, 28d supply, fill #2

## 2024-05-08 NOTE — Telephone Encounter (Signed)
 Last Fill: 02/14/2024  Labs: 04/26/2024 CBC is stable with elevated MCV.  CMP shows elevated creatinine and low potassium.  Most likely due to the use of diuretics.  Patient should contact her PCP regarding low potassium.  Lipid panel is normal.  Please add results to her PCP.   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

## 2024-05-08 NOTE — Progress Notes (Signed)
 Specialty Pharmacy Refill Coordination Note  Alexandra Powers is a 53 y.o. female contacted today regarding refills of specialty medication(s) Etanercept  (Enbrel  Mini)   Patient requested Delivery   Delivery date: 05/11/24   Verified address: [address] [place], [place] [address]   Medication will be filled on 06.11.25.   This fill date is pending response to refill request from provider. Patient is aware and if they have not received fill by intended date they must follow up with pharmacy.

## 2024-05-09 ENCOUNTER — Other Ambulatory Visit: Payer: Self-pay

## 2024-05-09 NOTE — Progress Notes (Signed)
 Specialty Pharmacy Ongoing Clinical Assessment Note  Alexandra Powers is a 53 y.o. female who is being followed by the specialty pharmacy service for RxSp Rheumatoid Arthritis   Patient's specialty medication(s) reviewed today: Etanercept  (Enbrel  Mini)   Missed doses in the last 4 weeks: 0   Patient/Caregiver did not have any additional questions or concerns.   Therapeutic benefit summary: Patient is achieving benefit   Adverse events/side effects summary: No adverse events/side effects   Patient's therapy is appropriate to: Continue    Goals Addressed             This Visit's Progress    Minimize recurrence of flares   On track    Patient is on track. Patient will maintain adherence.  [name] reports that she is well-controlled and has had no recent flares. She reports having some swelling in her hands, but not to the extent of needing a steroid injection.         Follow up: 12 months  [facility]

## 2024-05-10 ENCOUNTER — Other Ambulatory Visit: Payer: Self-pay

## 2024-05-31 ENCOUNTER — Other Ambulatory Visit: Payer: Self-pay

## 2024-06-03 ENCOUNTER — Encounter (INDEPENDENT_AMBULATORY_CARE_PROVIDER_SITE_OTHER): Payer: Self-pay

## 2024-06-05 ENCOUNTER — Other Ambulatory Visit: Payer: Self-pay

## 2024-06-05 ENCOUNTER — Other Ambulatory Visit: Payer: Self-pay | Admitting: Pharmacy Technician

## 2024-06-05 NOTE — Progress Notes (Signed)
 Specialty Pharmacy Refill Coordination Note  Alexandra Powers is a 54 y.o. female contacted today regarding refills of specialty medication(s)  Etanercept  (Enbrel  Mini)     Patient requested Delivery Delivery date:06/14/2024 Verified address: [address] [place]    Medication will be filled on 06/13/2024.  Patient answered Questionnaire

## 2024-06-13 ENCOUNTER — Other Ambulatory Visit: Payer: Self-pay

## 2024-06-13 ENCOUNTER — Other Ambulatory Visit: Payer: Self-pay | Admitting: Physician Assistant

## 2024-06-13 NOTE — Telephone Encounter (Signed)
 Last Fill: 04/05/2024 (30 day supply)  Labs: 04/26/2024 CBC is stable with elevated MCV.  CMP shows elevated creatinine and low potassium.   Next Visit: 09/26/2024  Last Visit: 04/26/2024  DX: Rheumatoid arthritis involving multiple sites with positive rheumatoid factor   Current Dose per office note 04/26/2024: Sulfasalazine  500 mg 2 tablets by mouth twice daily   Okay to refill Sulfasalazine ?

## 2024-07-04 ENCOUNTER — Encounter (INDEPENDENT_AMBULATORY_CARE_PROVIDER_SITE_OTHER): Payer: Self-pay

## 2024-07-05 ENCOUNTER — Other Ambulatory Visit: Payer: Self-pay | Admitting: Pharmacy Technician

## 2024-07-05 ENCOUNTER — Other Ambulatory Visit: Payer: Self-pay

## 2024-07-05 NOTE — Progress Notes (Signed)
 Specialty Pharmacy Refill Coordination Note  Alexandra Powers is a 52 y.o. female contacted today regarding refills of specialty medication(s)  Etanercept  (Enbrel  Mini)     Patient requested (Patient-Rptd) Delivery   Delivery date: 07/11/24 Verified address: (Patient-Rptd) [address] Irene LABOR [place]  [phone]   Medication will be filled on 07/10/24.

## 2024-07-10 ENCOUNTER — Other Ambulatory Visit: Payer: Self-pay

## 2024-07-25 HISTORY — PX: REPLACEMENT TOTAL KNEE: SUR1224

## 2024-07-28 ENCOUNTER — Other Ambulatory Visit (HOSPITAL_COMMUNITY): Payer: Self-pay

## 2024-08-07 ENCOUNTER — Other Ambulatory Visit: Payer: Self-pay

## 2024-08-25 ENCOUNTER — Other Ambulatory Visit: Payer: Self-pay

## 2024-08-26 ENCOUNTER — Other Ambulatory Visit: Payer: Self-pay | Admitting: Physician Assistant

## 2024-08-26 DIAGNOSIS — Z79899 Other long term (current) drug therapy: Secondary | ICD-10-CM

## 2024-08-26 DIAGNOSIS — M0579 Rheumatoid arthritis with rheumatoid factor of multiple sites without organ or systems involvement: Secondary | ICD-10-CM

## 2024-08-28 ENCOUNTER — Other Ambulatory Visit: Payer: Self-pay | Admitting: Pharmacy Technician

## 2024-08-28 ENCOUNTER — Other Ambulatory Visit (HOSPITAL_COMMUNITY): Payer: Self-pay

## 2024-08-28 ENCOUNTER — Other Ambulatory Visit: Payer: Self-pay

## 2024-08-28 ENCOUNTER — Telehealth: Payer: Self-pay

## 2024-08-28 MED ORDER — ENBREL MINI 50 MG/ML ~~LOC~~ SOCT
50.0000 mg | SUBCUTANEOUS | 0 refills | Status: DC
  Filled 2024-08-28: qty 12, 84d supply, fill #0
  Filled 2024-08-28: qty 4, 28d supply, fill #0
  Filled 2024-09-22: qty 4, 28d supply, fill #1
  Filled 2024-10-19: qty 4, 28d supply, fill #2

## 2024-08-28 NOTE — Telephone Encounter (Signed)
 Received notification from [facility] pharmacy that patient requires a new authorization for their medication.  Submitted an URGENT Prior Authorization request to [facility] MEDICAID for ENBREL  via CoverMyMeds. Will update once we receive a response.  Key: [id number]

## 2024-08-28 NOTE — Progress Notes (Signed)
 Specialty Pharmacy Refill Coordination Note  Alexandra Powers is a 53 y.o. female contacted today regarding refills of specialty medication(s) Etanercept  (Enbrel  Mini)   Patient requested Delivery   Delivery date: 08/31/24   Verified address: [address]   [place] [address]   Medication will be filled on 08/30/24. Injection due on 10/6. Sent to Pool: Rx Prior [employer] to assist.   This fill date is pending response to prior authorization. Patient is aware and if they have not received fill by intended date they must follow up with pharmacy.

## 2024-08-28 NOTE — Telephone Encounter (Signed)
 Received notification from [facility] MEDICAID regarding a prior authorization for ENBREL . Authorization has been APPROVED from 08/28/2024 to 08/28/2025. Approval letter sent to scan center.  Authorization # [id number]

## 2024-08-28 NOTE — Telephone Encounter (Signed)
 Last Fill: 05/08/2024  Labs: 07/26/2024 WBC 12.2, RBC 2.51, Hgb 8.3, Hct 25.5, MCV 101.6, MCH 33.1, RDW 57.1, Creat. 1.05, Calcium 8.4  TB Gold: 10/21/2023 Neg    Next Visit: 09/26/2024  Last Visit: 04/26/2024  IK:Myzlfjunpi arthritis involving multiple sites with positive rheumatoid factor   Current Dose per office note 04/26/2024: Enbrel  50 mg sq injections once weekly   Okay to refill Enbrel ?

## 2024-08-30 ENCOUNTER — Other Ambulatory Visit: Payer: Self-pay

## 2024-09-07 ENCOUNTER — Other Ambulatory Visit: Payer: Self-pay | Admitting: Physician Assistant

## 2024-09-07 NOTE — Telephone Encounter (Signed)
 Last Fill: 06/13/2024  Labs: 07/26/2024 Creat. 1.05, Calcium 8.4, WBC 12.2, RBC 2.51, Hgb 8.3, Hct 25.5, MCV 101.6, MCH 33.1, RDW 57.1  Next Visit: 09/26/2024  Last Visit: 04/26/2024  DX: Rheumatoid arthritis involving multiple sites with positive rheumatoid factor   Current Dose per office note 04/26/2024: Sulfasalazine  500 mg 2 tablets by mouth twice daily   Okay to refill Sulfasalazine ?

## 2024-09-12 NOTE — Progress Notes (Signed)
 Office Visit Note  Patient: Alexandra Powers             Date of Birth: 12-Oct-1971           MRN: [id number]             PCP: Annitta Charmaine LABOR, PA-C Referring: Annitta Charmaine LABOR, PA-C Visit Date: 09/26/2024 Occupation: Data Unavailable  Subjective:  Left wrist swelling  History of Present Illness: Natiya P Golladay is a 53 y.o. female with history of seropositive rheumatoid arthritis.  Patient remains on Enbrel  50 mg sq injections once weekly initiated on August 31, 2022 and Sulfasalazine  500 mg 2 tablets by mouth twice daily.  She is tolerating combination therapy without any side effects.  Patient reports that she underwent a left total knee replacement on 07/25/2024 by Dr. Lyle.  Patient states that she has completed physical therapy.  She continues to experience intermittent swelling in the left knee and has been icing as needed.  She continues to use a rollator walker to assist with ambulation.  Patient states that overall her left knee pain has improved significantly.  She is looking forward to getting her right knee replaced likely next year. Patient presents today with increased pain and inflammation in the left wrist for the past 1 week.  She denies any injury or repetitive activities.  For the past 3 days she has also been experiencing pain in the right foot.  Patient states that she woke up with the pain in her right foot and states that the pain is exacerbated by walking.  She denies any injury or fall prior to the onset of symptoms.  She denies any change in activity. She denies any recent or recurrent infections.  Activities of Daily Living:  Patient reports morning stiffness for 20-30 minutes  Patient Reports nocturnal pain.  Difficulty dressing/grooming: Denies Difficulty climbing stairs: Reports Difficulty getting out of chair: Reports Difficulty using hands for taps, buttons, cutlery, and/or writing: Reports  Review of Systems  Constitutional:  Negative for fatigue.  HENT:   Negative for mouth sores, mouth dryness and nose dryness.   Eyes:  Negative for pain, visual disturbance and dryness.  Respiratory:  Negative for cough, hemoptysis, shortness of breath and difficulty breathing.   Cardiovascular:  Negative for chest pain, palpitations, hypertension and swelling in legs/feet.  Gastrointestinal:  Negative for blood in stool, constipation and diarrhea.  Endocrine: Negative for increased urination.  Genitourinary:  Negative for painful urination.  Musculoskeletal:  Positive for joint pain, joint pain, joint swelling and morning stiffness. Negative for myalgias, muscle weakness, muscle tenderness and myalgias.  Skin:  Negative for color change, pallor, rash, hair loss, nodules/bumps, skin tightness, ulcers and sensitivity to sunlight.  Allergic/Immunologic: Negative for susceptible to infections.  Neurological:  Negative for dizziness, numbness, headaches and weakness.  Hematological:  Negative for swollen glands.  Psychiatric/Behavioral:  Negative for depressed mood and sleep disturbance. The patient is not nervous/anxious.     PMFS History:  Patient Active Problem List   Diagnosis Date Noted   CKD (chronic kidney disease), stage III (HCC) 09/09/2020   Preventive measure 09/09/2020   CKD (chronic kidney disease), stage II 09/08/2019   Trochanteric bursitis of left hip 09/08/2019   Anxiety and depression 09/01/2019   Chronic pain syndrome 08/08/2019   Chronic myofascial pain 08/08/2019   Bilateral breast cysts 08/01/2019   Cellulitis of left breast 07/14/2019   Other rheumatoid arthritis with rheumatoid factor of left hip (HCC) 07/14/2019   Primary  osteoarthritis of both hips 07/29/2018   Primary osteoarthritis of both knees 07/29/2018   Primary osteoarthritis of both feet 07/29/2018   Tonsillitis 05/02/2018   Acute prerenal failure 09/03/2017   OSA (obstructive sleep apnea) 12/10/2016   GERD (gastroesophageal reflux disease) 12/04/2016   Low back pain  at multiple sites 08/21/2016   Chronic pain of multiple joints 08/13/2016   MGUS (monoclonal gammopathy of unknown significance) 07/02/2016   Vitamin D  deficiency 07/02/2016   HTN (hypertension), benign 11/11/2015   Morbid obesity (HCC) 11/11/2015   Adrenal mass (HCC) 08/30/2015   Ovarian cyst 08/30/2015    Past Medical History:  Diagnosis Date   Arthritis    Gallstones    GERD (gastroesophageal reflux disease)    Hypertension    Mass of adrenal gland    MGUS (monoclonal gammopathy of unknown significance)    Obesity    Osteoarthritis    PONV (postoperative nausea and vomiting)    Rheumatoid arthritis (HCC)    Sleep apnea    SENT CPAP BACK, NOT WORN IN 7-8 MONTHS    Family History  Problem Relation Age of Onset   Arthritis Mother    High blood pressure Mother    Sleep apnea Mother    High blood pressure Father    Arthritis Sister    Breast cancer Paternal Aunt    Healthy Daughter    Healthy Son    Adrenal disorder Neg Hx    Colon cancer Neg Hx    Esophageal cancer Neg Hx    Pancreatic cancer Neg Hx    Stomach cancer Neg Hx    Liver disease Neg Hx    Past Surgical History:  Procedure Laterality Date   BREAST DUCTAL SYSTEM EXCISION Bilateral 10/12/2017   Procedure: EXCISION OF BILATERAL CENTRAL DUCT;  Surgeon: Vanderbilt Ned, MD;  Location: [facility] OR;  Service: General;  Laterality: Bilateral;   BREAST EXCISIONAL BIOPSY     CHOLECYSTECTOMY N/A 12/21/2015   Procedure: LAPAROSCOPIC CHOLECYSTECTOMY;  Surgeon: Dann Hummer, MD;  Location: [facility] OR;  Service: General;  Laterality: N/A;   REPLACEMENT TOTAL KNEE Left 07/25/2024   UMBILICAL HERNIA REPAIR N/A 12/27/2015   Procedure: INCARCERATED UMBILICAL HERNIA REPAIR;  Surgeon: Vicenta Poli, MD;  Location: [facility] OR;  Service: General;  Laterality: N/A;   Social History   Tobacco Use   Smoking status: Former    Current packs/day: 0.25    Average packs/day: 0.3 packs/day for 24.0 years (6.0 ttl pk-yrs)    Types: Cigarettes     Passive exposure: Past   Smokeless tobacco: Never   Tobacco comments:    Quit smoking  11/08/2023  Vaping Use   Vaping status: Never Used  Substance Use Topics   Alcohol use: Yes    Comment: occ   Drug use: Yes    Types: Marijuana    Comment: occ   Social History   Social History Narrative   Lives with husband, Elna.     Immunization History  Administered Date(s) Administered   Influenza,inj,Quad PF,6+ Mos 08/29/2015, 08/21/2016, 08/31/2017, 08/09/2018, 08/01/2019, 09/09/2020, 09/25/2022   Influenza,inj,quad, With Preservative 08/30/2017   PFIZER(Purple Top)SARS-COV-2 Vaccination 02/16/2020, 03/08/2020, 12/07/2020   Tdap 08/29/2015     Objective: Vital Signs: BP 101/71   Pulse 87   Temp (!) 97.3 F (36.3 C)   Resp 16   Ht 5' 8 (1.727 m)   Wt 236 lb 12.8 oz (107.4 kg)   LMP 04/16/2019   BMI 36.01 kg/m    Physical Exam Vitals and  nursing note reviewed.  Constitutional:      Appearance: She is well-developed.  HENT:     Head: Normocephalic and atraumatic.  Eyes:     Conjunctiva/sclera: Conjunctivae normal.  Cardiovascular:     Rate and Rhythm: Normal rate and regular rhythm.     Heart sounds: Normal heart sounds.  Pulmonary:     Effort: Pulmonary effort is normal.     Breath sounds: Normal breath sounds.  Abdominal:     General: Bowel sounds are normal.     Palpations: Abdomen is soft.  Musculoskeletal:     Cervical back: Normal range of motion.  Lymphadenopathy:     Cervical: No cervical adenopathy.  Skin:    General: Skin is warm and dry.     Capillary Refill: Capillary refill takes less than 2 seconds.  Neurological:     Mental Status: She is alert and oriented to person, place, and time.  Psychiatric:        Behavior: Behavior normal.      Musculoskeletal Exam: Patient remained seated during examination today.  C-spine has good range of motion.  Shoulder joints have good range of motion with no discomfort.  Elbow joints have good range of  motion with no tenderness along the joint line.  Left wrist extensor tenosynovitis noted.  Right wrist has good range of motion with no tenderness or synovitis.  No tenderness or synovitis affecting the MCP joints.  Complete fist formation bilaterally.  Hip joints able to assess in seated position.  Crepitus with range of motion of the right knee.  Left knee replacement has good range of motion with warmth.  No tenderness or swelling of ankle joints.  Some tenderness on the dorsal aspect of the right foot.  Tenderness of the right 3rd and 4th MTP joints noted.  CDAI Exam: CDAI Score: -- Patient Global: --; Provider Global: -- Swollen: --; Tender: -- Joint Exam 09/26/2024   No joint exam has been documented for this visit   There is currently no information documented on the homunculus. Go to the Rheumatology activity and complete the homunculus joint exam.  Investigation: No additional findings.  Imaging: No results found.  Recent Labs: Lab Results  Component Value Date   WBC 8.0 09/18/2024   HGB 11.0 (L) 09/18/2024   PLT 321 09/18/2024   NA 139 09/18/2024   K 3.5 09/18/2024   CL 105 09/18/2024   CO2 28 09/18/2024   GLUCOSE 101 (H) 09/18/2024   BUN 11 09/18/2024   CREATININE 0.94 09/18/2024   BILITOT 0.3 09/18/2024   ALKPHOS 59 09/18/2024   AST 21 09/18/2024   ALT 8 09/18/2024   PROT 7.6 09/18/2024   ALBUMIN 3.8 09/18/2024   CALCIUM 9.8 09/18/2024   GFRAA >60 09/02/2020   QFTBGOLDPLUS NEGATIVE 10/21/2023    Speciality Comments: PLQ Eye Exam: 08/12/18 WNL @ [facility] Follow up in 1 year.  ENbrel  started 08/31/22  Procedures:  No procedures performed Allergies: Patient has no known allergies.     Assessment / Plan:     Visit Diagnoses: Rheumatoid arthritis involving multiple sites with positive rheumatoid factor (HCC) - Positive RF, positive anti-CCP, positive elevated sedimentation rate, positive synovitis on ultrasound: Patient presents today experiencing a  flare affecting the left wrist and right foot.  She has noticed increased inflammation in the left wrist x 1 week and increased pain in the right foot x 3 days.  No identifiable trigger or injury prior to the onset of symptoms.  She has remained on Enbrel  50 mg subcu days injections once weekly and is taking sulfasalazine  500 mg 2 tablets twice daily.  She is tolerating combination therapy without any side effects.  Patient underwent a left total knee arthroplasty on 07/25/2024 at which time she held both Enbrel  and sulfasalazine  for about 2 weeks.  She has otherwise not had any recent gaps in therapy.  Overall her symptoms have been manageable on the current treatment regimen and she has not been experiencing any recurrent flares.  To alleviate her current symptoms she has requested a prednisone  taper.  A prednisone  taper starting at 20 mg tapering by 5 mg every 4 days were sent to the pharmacy.  Instructions were provided.  She was advised to notify us  if her symptoms persist or worsen.  She will follow up in 5 months or sooner if needed.   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  Underwent treatment with rifampin/INH for 3 months [facility] the [facility]year-old is negative known. Plan to recheck TB gold with next lab draw-order placed today. - Plan: QuantiFERON-TB Gold Plus  Primary osteoarthritis of both hands: Complete fist formation.  No tenderness or synovitis of MCPs. She presents today with extensor tenosynovitis affecting the left wrist. A prednisone  taper was sent to the pharmacy today.   High risk medication use - Enbrel  50 mg sq injections once weekly initiated on August 31, 2022, Sulfasalazine  500 mg 2 tablets by mouth twice daily. CBC and CMP updated on 09/18/24 Lipid panel updated on 04/26/24.  TB gold negative on 10/21/23.  Future order for TB gold placed today.  Discussed the importance of holding enbrel  and sulfasalazine  if she develops signs or symptoms of an  infection and to resume once the infection has completely cleared.   - Plan: QuantiFERON-TB Gold Plus  Screening for tuberculosis -Future order for TB gold placed today.  Plan: QuantiFERON-TB Gold Plus  Pain in left wrist: Mild extensor tenosynovitis affecting the left wrist noted. Plan to send in a prednisone  taper as discussed above.   Primary osteoarthritis of both hips: No increased groin pain at this time.  Using a rollator walker to assist with ambulation.   Trochanteric bursitis, left hip: Not currently symptomatic.   Primary osteoarthritis of right knee: No warmth or effusion noted.  Crepitus with ROM.  Planning to proceed with a knee replacement in the future.   S/P total knee arthroplasty, left: Dr. Lyle-- 07/25/2024.  No complications.  Completed physical therapy. Doing well.  Warmth noted.   Primary osteoarthritis of both feet: She presents today with increased pain in the right foot x3 days. No fall or injury prior to the onset to symptoms.  Tenderness of the right 3rd and 4th MTP joints.  Offered to obtain x-rays but she has declined.  She will notify us  if her symptoms persist or worsen.  A prednisone  taper was sent to the pharmacy.   Other medical conditions are listed as follows:  MGUS (monoclonal gammopathy of unknown significance): She remains under the care of hematology.  HTN (hypertension), benign: BP 101/71 today in the office.   Gastroesophageal reflux disease, unspecified whether esophagitis present  Vitamin D  deficiency  OSA (obstructive sleep apnea)  Dyslipidemia: Lipid panel updated on 04/26/24.    Orders: Orders Placed This Encounter  Procedures   QuantiFERON-TB Gold Plus   Meds ordered this encounter  Medications   sulfaSALAzine  (AZULFIDINE ) 500 MG tablet    Sig: Take 2 tablets (1,000 mg total) by mouth  2 (two) times daily.    Dispense:  360 tablet    Refill:  0   predniSONE  (DELTASONE ) 5 MG tablet    Sig: Take 4 tabs po x 4 days, 3  tabs po  x 4 days, 2  tabs po x 4 days, 1  tab po x 4 days    Dispense:  40 tablet    Refill:  0    Follow-Up Instructions: Return in about 5 months (around 02/24/2025) for Rheumatoid arthritis.   Waddell CHRISTELLA Craze, PA-C  Note - This record has been created using Dragon software.  Chart creation errors have been sought, but may not always  have been located. Such creation errors do not reflect on  the standard of medical care.

## 2024-09-18 ENCOUNTER — Inpatient Hospital Stay: Payer: Medicaid Other | Attending: Hematology and Oncology

## 2024-09-18 DIAGNOSIS — D472 Monoclonal gammopathy: Secondary | ICD-10-CM | POA: Diagnosis present

## 2024-09-18 LAB — COMPREHENSIVE METABOLIC PANEL WITH GFR
ALT: 8 U/L (ref 0–44)
AST: 21 U/L (ref 15–41)
Albumin: 3.8 g/dL (ref 3.5–5.0)
Alkaline Phosphatase: 59 U/L (ref 38–126)
Anion gap: 6 (ref 5–15)
BUN: 11 mg/dL (ref 6–20)
CO2: 28 mmol/L (ref 22–32)
Calcium: 9.8 mg/dL (ref 8.9–10.3)
Chloride: 105 mmol/L (ref 98–111)
Creatinine, Ser: 0.94 mg/dL (ref 0.44–1.00)
GFR, Estimated: >60 mL/min (ref >60)
Glucose, Bld: 101 mg/dL — ABNORMAL HIGH (ref 70–99)
Potassium: 3.5 mmol/L (ref 3.5–5.1)
Sodium: 139 mmol/L (ref 135–145)
Total Bilirubin: 0.3 mg/dL (ref 0.0–1.2)
Total Protein: 7.6 g/dL (ref 6.5–8.1)

## 2024-09-18 LAB — CBC WITH DIFFERENTIAL/PLATELET
Abs Immature Granulocytes: 0.02 K/uL (ref 0.00–0.07)
Basophils Absolute: 0.1 K/uL (ref 0.0–0.1)
Basophils Relative: 1 %
Eosinophils Absolute: 0.2 K/uL (ref 0.0–0.5)
Eosinophils Relative: 2 %
HCT: 33.2 % — ABNORMAL LOW (ref 36.0–46.0)
Hemoglobin: 11 g/dL — ABNORMAL LOW (ref 12.0–15.0)
Immature Granulocytes: 0 %
Lymphocytes Relative: 51 %
Lymphs Abs: 4.1 K/uL — ABNORMAL HIGH (ref 0.7–4.0)
MCH: 33.4 pg (ref 26.0–34.0)
MCHC: 33.1 g/dL (ref 30.0–36.0)
MCV: 100.9 fL — ABNORMAL HIGH (ref 80.0–100.0)
Monocytes Absolute: 0.6 K/uL (ref 0.1–1.0)
Monocytes Relative: 8 %
Neutro Abs: 3.1 K/uL (ref 1.7–7.7)
Neutrophils Relative %: 38 %
Platelets: 321 K/uL (ref 150–400)
RBC: 3.29 MIL/uL — ABNORMAL LOW (ref 3.87–5.11)
RDW: 18.3 % — ABNORMAL HIGH (ref 11.5–15.5)
Smear Review: NORMAL
WBC: 8 K/uL (ref 4.0–10.5)
nRBC: 0 % (ref 0.0–0.2)

## 2024-09-19 LAB — KAPPA/LAMBDA LIGHT CHAINS
Kappa free light chain: 44.8 mg/L — ABNORMAL HIGH (ref 3.3–19.4)
Kappa, lambda light chain ratio: 1.72 — ABNORMAL HIGH (ref 0.26–1.65)
Lambda free light chains: 26.1 mg/L (ref 5.7–26.3)

## 2024-09-21 LAB — MULTIPLE MYELOMA PANEL, SERUM
Albumin SerPl Elph-Mcnc: 3.2 g/dL (ref 2.9–4.4)
Albumin/Glob SerPl: 0.9 (ref 0.7–1.7)
Alpha 1: 0.3 g/dL (ref 0.0–0.4)
Alpha2 Glob SerPl Elph-Mcnc: 0.6 g/dL (ref 0.4–1.0)
B-Globulin SerPl Elph-Mcnc: 1.5 g/dL — ABNORMAL HIGH (ref 0.7–1.3)
Gamma Glob SerPl Elph-Mcnc: 1.3 g/dL (ref 0.4–1.8)
Globulin, Total: 3.7 g/dL (ref 2.2–3.9)
IgA: 738 mg/dL — ABNORMAL HIGH (ref 87–352)
IgG (Immunoglobin G), Serum: 1500 mg/dL (ref 586–1602)
IgM (Immunoglobulin M), Srm: 88 mg/dL (ref 26–217)
M Protein SerPl Elph-Mcnc: 0.1 g/dL — ABNORMAL HIGH
Total Protein ELP: 6.9 g/dL (ref 6.0–8.5)

## 2024-09-22 ENCOUNTER — Other Ambulatory Visit: Payer: Self-pay

## 2024-09-22 ENCOUNTER — Other Ambulatory Visit (HOSPITAL_COMMUNITY): Payer: Self-pay

## 2024-09-22 NOTE — Progress Notes (Signed)
 Specialty Pharmacy Refill Coordination Note  Spoke with Alexandra Powers is a 53 y.o. female contacted today regarding refills of specialty medication(s) Etanercept  (Enbrel  Mini)  Doses on hand: 1 for 10/27  Injection date: 10/02/24   Patient requested: Delivery   Delivery date: 09/27/24   Verified address: [address] [place] [address]  Medication will be filled on 09/26/24.

## 2024-09-26 ENCOUNTER — Encounter: Payer: Self-pay | Admitting: Physician Assistant

## 2024-09-26 ENCOUNTER — Ambulatory Visit: Attending: Physician Assistant | Admitting: Physician Assistant

## 2024-09-26 ENCOUNTER — Other Ambulatory Visit: Payer: Self-pay

## 2024-09-26 VITALS — BP 101/71 | HR 87 | Temp 97.3°F | Resp 16 | Ht 68.0 in | Wt 236.8 lb

## 2024-09-26 DIAGNOSIS — D472 Monoclonal gammopathy: Secondary | ICD-10-CM | POA: Diagnosis present

## 2024-09-26 DIAGNOSIS — G4733 Obstructive sleep apnea (adult) (pediatric): Secondary | ICD-10-CM | POA: Diagnosis present

## 2024-09-26 DIAGNOSIS — M19071 Primary osteoarthritis, right ankle and foot: Secondary | ICD-10-CM | POA: Insufficient documentation

## 2024-09-26 DIAGNOSIS — E559 Vitamin D deficiency, unspecified: Secondary | ICD-10-CM | POA: Diagnosis present

## 2024-09-26 DIAGNOSIS — K219 Gastro-esophageal reflux disease without esophagitis: Secondary | ICD-10-CM | POA: Diagnosis present

## 2024-09-26 DIAGNOSIS — M19042 Primary osteoarthritis, left hand: Secondary | ICD-10-CM | POA: Insufficient documentation

## 2024-09-26 DIAGNOSIS — M19041 Primary osteoarthritis, right hand: Secondary | ICD-10-CM | POA: Diagnosis present

## 2024-09-26 DIAGNOSIS — M1711 Unilateral primary osteoarthritis, right knee: Secondary | ICD-10-CM | POA: Diagnosis present

## 2024-09-26 DIAGNOSIS — R7612 Nonspecific reaction to cell mediated immunity measurement of gamma interferon antigen response without active tuberculosis: Secondary | ICD-10-CM | POA: Diagnosis present

## 2024-09-26 DIAGNOSIS — M160 Bilateral primary osteoarthritis of hip: Secondary | ICD-10-CM | POA: Diagnosis present

## 2024-09-26 DIAGNOSIS — M19072 Primary osteoarthritis, left ankle and foot: Secondary | ICD-10-CM | POA: Insufficient documentation

## 2024-09-26 DIAGNOSIS — M25532 Pain in left wrist: Secondary | ICD-10-CM | POA: Insufficient documentation

## 2024-09-26 DIAGNOSIS — M0579 Rheumatoid arthritis with rheumatoid factor of multiple sites without organ or systems involvement: Secondary | ICD-10-CM | POA: Insufficient documentation

## 2024-09-26 DIAGNOSIS — Z111 Encounter for screening for respiratory tuberculosis: Secondary | ICD-10-CM | POA: Insufficient documentation

## 2024-09-26 DIAGNOSIS — E785 Hyperlipidemia, unspecified: Secondary | ICD-10-CM | POA: Insufficient documentation

## 2024-09-26 DIAGNOSIS — M7062 Trochanteric bursitis, left hip: Secondary | ICD-10-CM | POA: Diagnosis present

## 2024-09-26 DIAGNOSIS — Z79899 Other long term (current) drug therapy: Secondary | ICD-10-CM | POA: Diagnosis not present

## 2024-09-26 DIAGNOSIS — Z96652 Presence of left artificial knee joint: Secondary | ICD-10-CM | POA: Diagnosis present

## 2024-09-26 DIAGNOSIS — M170 Bilateral primary osteoarthritis of knee: Secondary | ICD-10-CM

## 2024-09-26 DIAGNOSIS — I10 Essential (primary) hypertension: Secondary | ICD-10-CM | POA: Diagnosis present

## 2024-09-26 MED ORDER — PREDNISONE 5 MG PO TABS: ORAL_TABLET | ORAL | 0 refills | Status: AC

## 2024-09-26 MED ORDER — SULFASALAZINE 500 MG PO TABS: 1000.0000 mg | ORAL_TABLET | Freq: Two times a day (BID) | ORAL | 0 refills | Status: AC

## 2024-09-28 ENCOUNTER — Encounter: Payer: Self-pay | Admitting: Hematology and Oncology

## 2024-09-28 ENCOUNTER — Inpatient Hospital Stay: Payer: Medicaid Other | Admitting: Hematology and Oncology

## 2024-09-28 VITALS — BP 113/80 | HR 78 | Temp 98.6°F | Resp 18 | Ht 68.0 in | Wt 237.0 lb

## 2024-09-28 DIAGNOSIS — D472 Monoclonal gammopathy: Secondary | ICD-10-CM | POA: Diagnosis not present

## 2024-09-28 DIAGNOSIS — N183 Chronic kidney disease, stage 3 unspecified: Secondary | ICD-10-CM | POA: Diagnosis not present

## 2024-09-28 DIAGNOSIS — D631 Anemia in chronic kidney disease: Secondary | ICD-10-CM | POA: Insufficient documentation

## 2024-09-28 NOTE — Assessment & Plan Note (Addendum)
 She has been diagnosed with IgG lambda MGUS since 2017 and is on observation  Recent blood work showed no significant signs of disease progression She would need close follow-up once a year with history, blood work and examination. She agreed

## 2024-09-28 NOTE — Assessment & Plan Note (Addendum)
 She has intermittent elevated serum creatinine This is not related to myeloma Observe

## 2024-09-28 NOTE — Assessment & Plan Note (Addendum)
 She has intermittent anemia Recent blood count showed improvement compared with her blood work several months ago This is likely anemia chronic kidney disease Observe

## 2024-09-28 NOTE — Progress Notes (Signed)
 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 She agreed Stage 3 chronic kidney disease, unspecified whether stage 3a or 3b CKD (HCC) She has intermittent elevated serum creatinine This is not related to myeloma Observe Anemia in stage 3 chronic kidney disease, unspecified whether stage 3a or 3b CKD (HCC) She has intermittent anemia Recent blood count showed improvement compared with her blood work several months ago This is likely anemia chronic kidney disease Observe  No orders of the defined types were placed in this encounter.    INTERVAL HISTORY: she returns for surveillance follow-up for diagnosis of MGUS Patient denies recurrent infection or bone pain We reviewed recent CBC, CMP and myeloma panel results  PHYSICAL EXAMINATION: ECOG PERFORMANCE STATUS: 0 - Asymptomatic  Vitals:   09/28/24 1232  BP: 113/80  Pulse: 78  Resp: 18  Temp: 98.6 F (37 C)  SpO2: 97%

## 2024-10-02 LAB — CBC WITH DIFFERENTIAL/PLATELET
Absolute Lymphocytes: 4914 {cells}/uL — ABNORMAL HIGH (ref 850–3900)
Absolute Monocytes: 840 {cells}/uL (ref 200–950)
Basophils Absolute: 63 {cells}/uL (ref 0–200)
Basophils Relative: 0.6 %
Eosinophils Absolute: 336 {cells}/uL (ref 15–500)
Eosinophils Relative: 3.2 %
HCT: 40.7 % (ref 35.0–45.0)
Hemoglobin: 13.2 g/dL (ref 11.7–15.5)
MCH: 34.3 pg — ABNORMAL HIGH (ref 27.0–33.0)
MCHC: 32.4 g/dL (ref 32.0–36.0)
MCV: 105.7 fL — ABNORMAL HIGH (ref 80.0–100.0)
MPV: 10.7 fL (ref 7.5–12.5)
Monocytes Relative: 8 %
Neutro Abs: 4347 {cells}/uL (ref 1500–7800)
Neutrophils Relative %: 41.4 %
Platelets: 296 Thousand/uL (ref 140–400)
RBC: 3.85 Million/uL (ref 3.80–5.10)
RDW: 13.4 % (ref 11.0–15.0)
Total Lymphocyte: 46.8 %
WBC: 10.5 Thousand/uL (ref 3.8–10.8)

## 2024-10-02 LAB — LIPID PANEL
Cholesterol: 135 mg/dL (ref <200)
HDL: 53 mg/dL (ref >=50)
LDL Cholesterol (Calc): 67 mg/dL
Non-HDL Cholesterol (Calc): 82 mg/dL (ref <130)
Total CHOL/HDL Ratio: 2.5 (calc) (ref <5.0)
Triglycerides: 73 mg/dL (ref <150)

## 2024-10-02 LAB — COMPREHENSIVE METABOLIC PANEL WITH GFR
AG Ratio: 1.2 (calc) (ref 1.0–2.5)
ALT: 5 U/L — ABNORMAL LOW (ref 6–29)
AST: 14 U/L (ref 10–35)
Albumin: 4.1 g/dL (ref 3.6–5.1)
Alkaline phosphatase (APISO): 55 U/L (ref 37–153)
BUN/Creatinine Ratio: 17 (calc) (ref 6–22)
BUN: 19 mg/dL (ref 7–25)
CO2: 32 mmol/L (ref 20–32)
Calcium: 9.7 mg/dL (ref 8.6–10.4)
Chloride: 101 mmol/L (ref 98–110)
Creat: 1.09 mg/dL — ABNORMAL HIGH (ref 0.50–1.03)
Globulin: 3.3 g/dL (ref 1.9–3.7)
Glucose, Bld: 78 mg/dL (ref 65–139)
Potassium: 3.4 mmol/L — ABNORMAL LOW (ref 3.5–5.3)
Sodium: 144 mmol/L (ref 135–146)
Total Bilirubin: 0.4 mg/dL (ref 0.2–1.2)
Total Protein: 7.4 g/dL (ref 6.1–8.1)
eGFR: 61 mL/min/1.73m2 (ref >=60)

## 2024-10-04 ENCOUNTER — Other Ambulatory Visit (HOSPITAL_COMMUNITY): Payer: Self-pay

## 2024-10-09 ENCOUNTER — Other Ambulatory Visit: Payer: Self-pay | Admitting: Physician Assistant

## 2024-10-17 ENCOUNTER — Other Ambulatory Visit (HOSPITAL_COMMUNITY): Payer: Self-pay

## 2024-10-19 ENCOUNTER — Other Ambulatory Visit: Payer: Self-pay

## 2024-10-24 ENCOUNTER — Other Ambulatory Visit: Payer: Self-pay

## 2024-10-24 NOTE — Progress Notes (Signed)
 Specialty Pharmacy Refill Coordination Note  Alexandra Powers is a 52 y.o. female contacted today regarding refills of specialty medication(s) Etanercept  (Enbrel  Mini)   Patient requested Delivery   Delivery date: 10/25/24   Verified address: [address] [place]    Medication will be filled on: 10/24/24

## 2024-11-15 ENCOUNTER — Other Ambulatory Visit: Payer: Self-pay | Admitting: Rheumatology

## 2024-11-15 ENCOUNTER — Other Ambulatory Visit: Payer: Self-pay

## 2024-11-15 DIAGNOSIS — Z79899 Other long term (current) drug therapy: Secondary | ICD-10-CM

## 2024-11-15 DIAGNOSIS — M0579 Rheumatoid arthritis with rheumatoid factor of multiple sites without organ or systems involvement: Secondary | ICD-10-CM

## 2024-11-16 ENCOUNTER — Other Ambulatory Visit: Payer: Self-pay

## 2024-11-17 ENCOUNTER — Other Ambulatory Visit (HOSPITAL_COMMUNITY): Payer: Self-pay

## 2024-11-21 ENCOUNTER — Other Ambulatory Visit (HOSPITAL_COMMUNITY): Payer: Self-pay

## 2024-12-18 ENCOUNTER — Other Ambulatory Visit: Payer: Self-pay | Admitting: Rheumatology

## 2024-12-18 ENCOUNTER — Other Ambulatory Visit: Payer: Self-pay

## 2024-12-18 ENCOUNTER — Other Ambulatory Visit (HOSPITAL_COMMUNITY): Payer: Self-pay

## 2024-12-18 DIAGNOSIS — Z79899 Other long term (current) drug therapy: Secondary | ICD-10-CM

## 2024-12-18 DIAGNOSIS — M0579 Rheumatoid arthritis with rheumatoid factor of multiple sites without organ or systems involvement: Secondary | ICD-10-CM

## 2024-12-18 MED ORDER — ENBREL MINI 50 MG/ML ~~LOC~~ SOCT
50.0000 mg | SUBCUTANEOUS | 0 refills | Status: AC
  Filled 2024-12-18 – 2024-12-19 (×2): qty 4, 28d supply, fill #0

## 2024-12-18 NOTE — Telephone Encounter (Signed)
 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   Okay to refill Enbrel ?

## 2024-12-19 ENCOUNTER — Other Ambulatory Visit: Payer: Self-pay

## 2024-12-19 ENCOUNTER — Other Ambulatory Visit (HOSPITAL_COMMUNITY): Payer: Self-pay

## 2024-12-21 ENCOUNTER — Other Ambulatory Visit: Payer: Self-pay | Admitting: *Deleted

## 2024-12-21 ENCOUNTER — Other Ambulatory Visit: Payer: Self-pay

## 2024-12-21 DIAGNOSIS — Z111 Encounter for screening for respiratory tuberculosis: Secondary | ICD-10-CM

## 2024-12-21 DIAGNOSIS — R7612 Nonspecific reaction to cell mediated immunity measurement of gamma interferon antigen response without active tuberculosis: Secondary | ICD-10-CM

## 2024-12-21 DIAGNOSIS — Z79899 Other long term (current) drug therapy: Secondary | ICD-10-CM

## 2024-12-21 NOTE — Progress Notes (Signed)
 Specialty Pharmacy Refill Coordination Note  Alexandra Powers is a 54 y.o. female contacted today regarding refills of specialty medication(s) Etanercept  (Enbrel  Mini)   Patient requested Delivery   Delivery date: 12/22/24   Verified address: [address] [place]    Medication will be filled on: 12/21/24

## 2024-12-22 ENCOUNTER — Ambulatory Visit: Payer: Self-pay | Admitting: Rheumatology

## 2024-12-22 NOTE — Progress Notes (Signed)
 CMP is normal.  TB Gold is pending.

## 2024-12-25 ENCOUNTER — Ambulatory Visit: Payer: Self-pay | Admitting: Physician Assistant

## 2024-12-25 DIAGNOSIS — R7612 Nonspecific reaction to cell mediated immunity measurement of gamma interferon antigen response without active tuberculosis: Secondary | ICD-10-CM

## 2024-12-25 LAB — QUANTIFERON-TB GOLD PLUS
Mitogen-NIL: >10 [IU]/mL
NIL: 0.02 [IU]/mL
QuantiFERON-TB Gold Plus: POSITIVE — AB
TB1-NIL: 0.38 [IU]/mL
TB2-NIL: 0.37 [IU]/mL

## 2024-12-25 LAB — COMPREHENSIVE METABOLIC PANEL WITH GFR
AG Ratio: 1.1 (calc) (ref 1.0–2.5)
ALT: 5 U/L — ABNORMAL LOW (ref 6–29)
AST: 16 U/L (ref 10–35)
Albumin: 3.2 g/dL — ABNORMAL LOW (ref 3.6–5.1)
Alkaline phosphatase (APISO): 72 U/L (ref 37–153)
BUN: 9 mg/dL (ref 7–25)
CO2: 25 mmol/L (ref 20–32)
Calcium: 8.9 mg/dL (ref 8.6–10.4)
Chloride: 108 mmol/L (ref 98–110)
Creat: 0.77 mg/dL (ref 0.50–1.03)
Globulin: 2.9 g/dL (ref 1.9–3.7)
Glucose, Bld: 87 mg/dL (ref 65–99)
Potassium: 4.1 mmol/L (ref 3.5–5.3)
Sodium: 141 mmol/L (ref 135–146)
Total Bilirubin: 0.5 mg/dL (ref 0.2–1.2)
Total Protein: 6.1 g/dL (ref 6.1–8.1)
eGFR: 92 mL/min/{1.73_m2}

## 2024-12-25 NOTE — Progress Notes (Signed)
 Patient previously was treated for TB through the health department.  Recommend repeating CXR

## 2024-12-25 NOTE — Progress Notes (Signed)
 I called patient regarding the positive TB Gold result.  She had positive TB Gold in 2019 and was [facility] for [facility] months.  I advised her to send us  the records of the treatment so we have the records on file.  Patient voiced understanding.  I also advised her to get annual chest x-ray.  She has not had a chest x-ray since 2019.  She would like to go for a chest x-ray in [place].  Please place an order for chest x-ray PA and lateral for positive TB Gold and immunosuppressive therapy in [place].  Patient stated that she can go within the next 2 weeks.

## 2025-02-26 ENCOUNTER — Ambulatory Visit: Admitting: Physician Assistant

## 2025-09-18 ENCOUNTER — Inpatient Hospital Stay

## 2025-09-28 ENCOUNTER — Inpatient Hospital Stay: Admitting: Hematology and Oncology
